# Patient Record
Sex: Female | Born: 1935
Health system: Southern US, Community
[De-identification: ages and names within clinical notes are randomized; demographics above are authoritative.]

## PROBLEM LIST (undated history)

## (undated) ENCOUNTER — Other Ambulatory Visit: Payer: Medicare Other | Admitting: Urology

## (undated) DIAGNOSIS — I639 Cerebral infarction, unspecified: Secondary | ICD-10-CM

## (undated) DIAGNOSIS — R569 Unspecified convulsions: Secondary | ICD-10-CM

## (undated) DIAGNOSIS — Z803 Family history of malignant neoplasm of breast: Secondary | ICD-10-CM

## (undated) DIAGNOSIS — M1990 Unspecified osteoarthritis, unspecified site: Secondary | ICD-10-CM

## (undated) DIAGNOSIS — E782 Mixed hyperlipidemia: Secondary | ICD-10-CM

## (undated) DIAGNOSIS — G3184 Mild cognitive impairment, so stated: Secondary | ICD-10-CM

## (undated) DIAGNOSIS — Z801 Family history of malignant neoplasm of trachea, bronchus and lung: Secondary | ICD-10-CM

## (undated) DIAGNOSIS — K219 Gastro-esophageal reflux disease without esophagitis: Secondary | ICD-10-CM

## (undated) DIAGNOSIS — E119 Type 2 diabetes mellitus without complications: Secondary | ICD-10-CM

## (undated) DIAGNOSIS — C689 Malignant neoplasm of urinary organ, unspecified: Secondary | ICD-10-CM

## (undated) DIAGNOSIS — E7800 Pure hypercholesterolemia, unspecified: Secondary | ICD-10-CM

## (undated) DIAGNOSIS — I10 Essential (primary) hypertension: Secondary | ICD-10-CM

## (undated) DIAGNOSIS — M359 Systemic involvement of connective tissue, unspecified: Secondary | ICD-10-CM

## (undated) DIAGNOSIS — R200 Anesthesia of skin: Secondary | ICD-10-CM

## (undated) DIAGNOSIS — R202 Paresthesia of skin: Secondary | ICD-10-CM

## (undated) DIAGNOSIS — D649 Anemia, unspecified: Secondary | ICD-10-CM

## (undated) DIAGNOSIS — C50919 Malignant neoplasm of unspecified site of unspecified female breast: Secondary | ICD-10-CM

## (undated) DIAGNOSIS — N390 Urinary tract infection, site not specified: Secondary | ICD-10-CM

## (undated) DIAGNOSIS — C801 Malignant (primary) neoplasm, unspecified: Secondary | ICD-10-CM

## (undated) DIAGNOSIS — Z8052 Family history of malignant neoplasm of bladder: Secondary | ICD-10-CM

## (undated) DIAGNOSIS — Z8554 Personal history of malignant neoplasm of ureter: Secondary | ICD-10-CM

## (undated) DIAGNOSIS — R9082 White matter disease, unspecified: Secondary | ICD-10-CM

## (undated) HISTORY — PX: OTHER SURGICAL HISTORY: SHX169

## (undated) HISTORY — DX: Essential (primary) hypertension: I10

## (undated) HISTORY — DX: Family history of malignant neoplasm of breast: Z80.3

## (undated) HISTORY — PX: COLONOSCOPY: SHX174

## (undated) HISTORY — DX: Personal history of malignant neoplasm of ureter: Z85.54

## (undated) HISTORY — PX: JOINT REPLACEMENT: SHX530

## (undated) HISTORY — DX: Family history of malignant neoplasm of bladder: Z80.52

## (undated) HISTORY — DX: Family history of malignant neoplasm of trachea, bronchus and lung: Z80.1

---

## 1984-03-13 DIAGNOSIS — C50919 Malignant neoplasm of unspecified site of unspecified female breast: Secondary | ICD-10-CM

## 1984-03-13 HISTORY — DX: Malignant neoplasm of unspecified site of unspecified female breast: C50.919

## 1984-03-13 HISTORY — PX: MASTECTOMY: SHX3

## 1984-07-11 HISTORY — PX: BREAST SURGERY: SHX581

## 1984-09-10 HISTORY — PX: ABDOMINAL HYSTERECTOMY: SHX81

## 1997-09-29 ENCOUNTER — Other Ambulatory Visit: Admission: RE | Admit: 1997-09-29 | Discharge: 1997-09-29 | Payer: Self-pay | Admitting: Podiatry

## 2004-09-29 ENCOUNTER — Ambulatory Visit: Payer: Self-pay | Admitting: Internal Medicine

## 2004-09-29 LAB — HM COLONOSCOPY: HM Colonoscopy: NORMAL

## 2008-03-24 LAB — HM PAP SMEAR

## 2008-03-31 ENCOUNTER — Ambulatory Visit: Payer: Self-pay | Admitting: Family Medicine

## 2008-11-18 ENCOUNTER — Ambulatory Visit: Payer: Self-pay | Admitting: Otolaryngology

## 2008-12-24 ENCOUNTER — Ambulatory Visit: Payer: Self-pay | Admitting: Gastroenterology

## 2010-08-25 ENCOUNTER — Ambulatory Visit: Payer: Self-pay | Admitting: Family Medicine

## 2011-03-14 HISTORY — PX: EYE SURGERY: SHX253

## 2011-04-19 DIAGNOSIS — L570 Actinic keratosis: Secondary | ICD-10-CM | POA: Diagnosis not present

## 2011-04-19 DIAGNOSIS — L219 Seborrheic dermatitis, unspecified: Secondary | ICD-10-CM | POA: Diagnosis not present

## 2011-04-19 DIAGNOSIS — B351 Tinea unguium: Secondary | ICD-10-CM | POA: Diagnosis not present

## 2011-04-19 DIAGNOSIS — C44611 Basal cell carcinoma of skin of unspecified upper limb, including shoulder: Secondary | ICD-10-CM | POA: Diagnosis not present

## 2011-04-19 DIAGNOSIS — D485 Neoplasm of uncertain behavior of skin: Secondary | ICD-10-CM | POA: Diagnosis not present

## 2011-05-18 DIAGNOSIS — Z79899 Other long term (current) drug therapy: Secondary | ICD-10-CM | POA: Diagnosis not present

## 2011-05-18 DIAGNOSIS — H35719 Central serous chorioretinopathy, unspecified eye: Secondary | ICD-10-CM | POA: Diagnosis not present

## 2011-05-18 DIAGNOSIS — E785 Hyperlipidemia, unspecified: Secondary | ICD-10-CM | POA: Diagnosis not present

## 2011-05-18 DIAGNOSIS — E559 Vitamin D deficiency, unspecified: Secondary | ICD-10-CM | POA: Diagnosis not present

## 2011-05-22 DIAGNOSIS — H35349 Macular cyst, hole, or pseudohole, unspecified eye: Secondary | ICD-10-CM | POA: Diagnosis not present

## 2011-05-24 DIAGNOSIS — C4491 Basal cell carcinoma of skin, unspecified: Secondary | ICD-10-CM | POA: Diagnosis not present

## 2011-05-24 DIAGNOSIS — C44611 Basal cell carcinoma of skin of unspecified upper limb, including shoulder: Secondary | ICD-10-CM | POA: Diagnosis not present

## 2011-05-24 DIAGNOSIS — L219 Seborrheic dermatitis, unspecified: Secondary | ICD-10-CM | POA: Diagnosis not present

## 2011-06-06 DIAGNOSIS — H35349 Macular cyst, hole, or pseudohole, unspecified eye: Secondary | ICD-10-CM | POA: Diagnosis not present

## 2011-06-26 ENCOUNTER — Ambulatory Visit: Payer: Self-pay | Admitting: Ophthalmology

## 2011-06-26 DIAGNOSIS — H35349 Macular cyst, hole, or pseudohole, unspecified eye: Secondary | ICD-10-CM | POA: Diagnosis not present

## 2011-06-26 DIAGNOSIS — Z01818 Encounter for other preprocedural examination: Secondary | ICD-10-CM | POA: Diagnosis not present

## 2011-06-26 DIAGNOSIS — Z01810 Encounter for preprocedural cardiovascular examination: Secondary | ICD-10-CM | POA: Diagnosis not present

## 2011-06-27 DIAGNOSIS — H35349 Macular cyst, hole, or pseudohole, unspecified eye: Secondary | ICD-10-CM | POA: Diagnosis not present

## 2011-06-28 ENCOUNTER — Ambulatory Visit: Payer: Self-pay | Admitting: Ophthalmology

## 2011-06-28 DIAGNOSIS — H33309 Unspecified retinal break, unspecified eye: Secondary | ICD-10-CM | POA: Diagnosis not present

## 2011-06-28 DIAGNOSIS — Z853 Personal history of malignant neoplasm of breast: Secondary | ICD-10-CM | POA: Diagnosis not present

## 2011-06-28 DIAGNOSIS — E669 Obesity, unspecified: Secondary | ICD-10-CM | POA: Diagnosis not present

## 2011-06-28 DIAGNOSIS — Z7982 Long term (current) use of aspirin: Secondary | ICD-10-CM | POA: Diagnosis not present

## 2011-06-28 DIAGNOSIS — K219 Gastro-esophageal reflux disease without esophagitis: Secondary | ICD-10-CM | POA: Diagnosis not present

## 2011-06-28 DIAGNOSIS — Z79899 Other long term (current) drug therapy: Secondary | ICD-10-CM | POA: Diagnosis not present

## 2011-06-28 DIAGNOSIS — M129 Arthropathy, unspecified: Secondary | ICD-10-CM | POA: Diagnosis not present

## 2011-06-28 DIAGNOSIS — H33009 Unspecified retinal detachment with retinal break, unspecified eye: Secondary | ICD-10-CM | POA: Diagnosis not present

## 2011-06-28 DIAGNOSIS — E7800 Pure hypercholesterolemia, unspecified: Secondary | ICD-10-CM | POA: Diagnosis not present

## 2011-06-28 DIAGNOSIS — H35349 Macular cyst, hole, or pseudohole, unspecified eye: Secondary | ICD-10-CM | POA: Diagnosis not present

## 2011-07-25 DIAGNOSIS — H35349 Macular cyst, hole, or pseudohole, unspecified eye: Secondary | ICD-10-CM | POA: Diagnosis not present

## 2011-08-08 DIAGNOSIS — Z1331 Encounter for screening for depression: Secondary | ICD-10-CM | POA: Diagnosis not present

## 2011-08-08 DIAGNOSIS — Z0000 Encounter for general adult medical examination without abnormal findings: Secondary | ICD-10-CM | POA: Diagnosis not present

## 2011-08-08 DIAGNOSIS — Z1339 Encounter for screening examination for other mental health and behavioral disorders: Secondary | ICD-10-CM | POA: Diagnosis not present

## 2011-08-08 DIAGNOSIS — Z23 Encounter for immunization: Secondary | ICD-10-CM | POA: Diagnosis not present

## 2011-08-08 DIAGNOSIS — D493 Neoplasm of unspecified behavior of breast: Secondary | ICD-10-CM | POA: Diagnosis not present

## 2011-09-06 DIAGNOSIS — M899 Disorder of bone, unspecified: Secondary | ICD-10-CM | POA: Diagnosis not present

## 2011-09-06 DIAGNOSIS — E2839 Other primary ovarian failure: Secondary | ICD-10-CM | POA: Diagnosis not present

## 2011-09-06 DIAGNOSIS — M949 Disorder of cartilage, unspecified: Secondary | ICD-10-CM | POA: Diagnosis not present

## 2011-09-06 DIAGNOSIS — H35349 Macular cyst, hole, or pseudohole, unspecified eye: Secondary | ICD-10-CM | POA: Diagnosis not present

## 2011-09-06 DIAGNOSIS — M948X9 Other specified disorders of cartilage, unspecified sites: Secondary | ICD-10-CM | POA: Diagnosis not present

## 2011-09-13 ENCOUNTER — Ambulatory Visit: Payer: Self-pay | Admitting: Family Medicine

## 2011-09-13 DIAGNOSIS — Z1231 Encounter for screening mammogram for malignant neoplasm of breast: Secondary | ICD-10-CM | POA: Diagnosis not present

## 2011-09-13 DIAGNOSIS — Z853 Personal history of malignant neoplasm of breast: Secondary | ICD-10-CM | POA: Diagnosis not present

## 2011-10-31 DIAGNOSIS — D493 Neoplasm of unspecified behavior of breast: Secondary | ICD-10-CM | POA: Diagnosis not present

## 2011-10-31 DIAGNOSIS — N390 Urinary tract infection, site not specified: Secondary | ICD-10-CM | POA: Diagnosis not present

## 2011-10-31 DIAGNOSIS — Z1331 Encounter for screening for depression: Secondary | ICD-10-CM | POA: Diagnosis not present

## 2011-10-31 DIAGNOSIS — R109 Unspecified abdominal pain: Secondary | ICD-10-CM | POA: Diagnosis not present

## 2011-11-06 DIAGNOSIS — H35379 Puckering of macula, unspecified eye: Secondary | ICD-10-CM | POA: Diagnosis not present

## 2012-01-23 DIAGNOSIS — H35349 Macular cyst, hole, or pseudohole, unspecified eye: Secondary | ICD-10-CM | POA: Diagnosis not present

## 2012-01-23 DIAGNOSIS — R5383 Other fatigue: Secondary | ICD-10-CM | POA: Diagnosis not present

## 2012-01-23 DIAGNOSIS — N390 Urinary tract infection, site not specified: Secondary | ICD-10-CM | POA: Diagnosis not present

## 2012-01-23 DIAGNOSIS — E559 Vitamin D deficiency, unspecified: Secondary | ICD-10-CM | POA: Diagnosis not present

## 2012-01-23 DIAGNOSIS — Z79899 Other long term (current) drug therapy: Secondary | ICD-10-CM | POA: Diagnosis not present

## 2012-01-23 DIAGNOSIS — R5381 Other malaise: Secondary | ICD-10-CM | POA: Diagnosis not present

## 2012-01-23 DIAGNOSIS — Z23 Encounter for immunization: Secondary | ICD-10-CM | POA: Diagnosis not present

## 2012-01-23 DIAGNOSIS — E7800 Pure hypercholesterolemia, unspecified: Secondary | ICD-10-CM | POA: Diagnosis not present

## 2012-02-16 DIAGNOSIS — H2510 Age-related nuclear cataract, unspecified eye: Secondary | ICD-10-CM | POA: Diagnosis not present

## 2012-04-08 DIAGNOSIS — L723 Sebaceous cyst: Secondary | ICD-10-CM | POA: Diagnosis not present

## 2012-04-08 DIAGNOSIS — L259 Unspecified contact dermatitis, unspecified cause: Secondary | ICD-10-CM | POA: Diagnosis not present

## 2012-04-08 DIAGNOSIS — L608 Other nail disorders: Secondary | ICD-10-CM | POA: Diagnosis not present

## 2012-04-23 DIAGNOSIS — H2510 Age-related nuclear cataract, unspecified eye: Secondary | ICD-10-CM | POA: Diagnosis not present

## 2012-04-30 ENCOUNTER — Ambulatory Visit: Payer: Self-pay | Admitting: Ophthalmology

## 2012-04-30 DIAGNOSIS — Z79899 Other long term (current) drug therapy: Secondary | ICD-10-CM | POA: Diagnosis not present

## 2012-04-30 DIAGNOSIS — Z880 Allergy status to penicillin: Secondary | ICD-10-CM | POA: Diagnosis not present

## 2012-04-30 DIAGNOSIS — K219 Gastro-esophageal reflux disease without esophagitis: Secondary | ICD-10-CM | POA: Diagnosis not present

## 2012-04-30 DIAGNOSIS — H2510 Age-related nuclear cataract, unspecified eye: Secondary | ICD-10-CM | POA: Diagnosis not present

## 2012-04-30 DIAGNOSIS — H9190 Unspecified hearing loss, unspecified ear: Secondary | ICD-10-CM | POA: Diagnosis not present

## 2012-04-30 DIAGNOSIS — M810 Age-related osteoporosis without current pathological fracture: Secondary | ICD-10-CM | POA: Diagnosis not present

## 2012-04-30 DIAGNOSIS — R011 Cardiac murmur, unspecified: Secondary | ICD-10-CM | POA: Diagnosis not present

## 2012-04-30 DIAGNOSIS — Z7982 Long term (current) use of aspirin: Secondary | ICD-10-CM | POA: Diagnosis not present

## 2012-04-30 DIAGNOSIS — Z853 Personal history of malignant neoplasm of breast: Secondary | ICD-10-CM | POA: Diagnosis not present

## 2012-04-30 DIAGNOSIS — E7800 Pure hypercholesterolemia, unspecified: Secondary | ICD-10-CM | POA: Diagnosis not present

## 2012-04-30 DIAGNOSIS — Z882 Allergy status to sulfonamides status: Secondary | ICD-10-CM | POA: Diagnosis not present

## 2012-04-30 DIAGNOSIS — M069 Rheumatoid arthritis, unspecified: Secondary | ICD-10-CM | POA: Diagnosis not present

## 2012-07-17 DIAGNOSIS — M2030 Hallux varus (acquired), unspecified foot: Secondary | ICD-10-CM | POA: Diagnosis not present

## 2012-07-17 DIAGNOSIS — L600 Ingrowing nail: Secondary | ICD-10-CM | POA: Diagnosis not present

## 2012-07-17 DIAGNOSIS — M19079 Primary osteoarthritis, unspecified ankle and foot: Secondary | ICD-10-CM | POA: Diagnosis not present

## 2012-07-17 DIAGNOSIS — M79609 Pain in unspecified limb: Secondary | ICD-10-CM | POA: Diagnosis not present

## 2012-07-17 DIAGNOSIS — M2040 Other hammer toe(s) (acquired), unspecified foot: Secondary | ICD-10-CM | POA: Diagnosis not present

## 2012-07-23 DIAGNOSIS — H35349 Macular cyst, hole, or pseudohole, unspecified eye: Secondary | ICD-10-CM | POA: Diagnosis not present

## 2012-08-14 DIAGNOSIS — Z23 Encounter for immunization: Secondary | ICD-10-CM | POA: Diagnosis not present

## 2012-08-14 DIAGNOSIS — Z1331 Encounter for screening for depression: Secondary | ICD-10-CM | POA: Diagnosis not present

## 2012-08-14 DIAGNOSIS — Z1339 Encounter for screening examination for other mental health and behavioral disorders: Secondary | ICD-10-CM | POA: Diagnosis not present

## 2012-08-14 DIAGNOSIS — Z0000 Encounter for general adult medical examination without abnormal findings: Secondary | ICD-10-CM | POA: Diagnosis not present

## 2012-08-14 DIAGNOSIS — E559 Vitamin D deficiency, unspecified: Secondary | ICD-10-CM | POA: Diagnosis not present

## 2012-09-04 DIAGNOSIS — M2040 Other hammer toe(s) (acquired), unspecified foot: Secondary | ICD-10-CM | POA: Diagnosis not present

## 2012-09-04 DIAGNOSIS — M2030 Hallux varus (acquired), unspecified foot: Secondary | ICD-10-CM | POA: Diagnosis not present

## 2012-09-04 DIAGNOSIS — L03039 Cellulitis of unspecified toe: Secondary | ICD-10-CM | POA: Diagnosis not present

## 2012-09-04 DIAGNOSIS — L600 Ingrowing nail: Secondary | ICD-10-CM | POA: Diagnosis not present

## 2012-09-19 DIAGNOSIS — L820 Inflamed seborrheic keratosis: Secondary | ICD-10-CM | POA: Diagnosis not present

## 2012-09-19 DIAGNOSIS — Z1283 Encounter for screening for malignant neoplasm of skin: Secondary | ICD-10-CM | POA: Diagnosis not present

## 2012-09-19 DIAGNOSIS — Z85828 Personal history of other malignant neoplasm of skin: Secondary | ICD-10-CM | POA: Diagnosis not present

## 2012-12-04 DIAGNOSIS — Z23 Encounter for immunization: Secondary | ICD-10-CM | POA: Diagnosis not present

## 2012-12-12 DIAGNOSIS — M129 Arthropathy, unspecified: Secondary | ICD-10-CM | POA: Diagnosis not present

## 2012-12-12 DIAGNOSIS — E7800 Pure hypercholesterolemia, unspecified: Secondary | ICD-10-CM | POA: Diagnosis not present

## 2012-12-12 DIAGNOSIS — E559 Vitamin D deficiency, unspecified: Secondary | ICD-10-CM | POA: Diagnosis not present

## 2012-12-12 DIAGNOSIS — I10 Essential (primary) hypertension: Secondary | ICD-10-CM | POA: Diagnosis not present

## 2012-12-12 DIAGNOSIS — E669 Obesity, unspecified: Secondary | ICD-10-CM | POA: Diagnosis not present

## 2012-12-12 DIAGNOSIS — E785 Hyperlipidemia, unspecified: Secondary | ICD-10-CM | POA: Diagnosis not present

## 2012-12-12 DIAGNOSIS — C50919 Malignant neoplasm of unspecified site of unspecified female breast: Secondary | ICD-10-CM | POA: Diagnosis not present

## 2012-12-17 ENCOUNTER — Ambulatory Visit: Payer: Self-pay | Admitting: Family Medicine

## 2012-12-17 DIAGNOSIS — R928 Other abnormal and inconclusive findings on diagnostic imaging of breast: Secondary | ICD-10-CM | POA: Diagnosis not present

## 2012-12-17 DIAGNOSIS — Z853 Personal history of malignant neoplasm of breast: Secondary | ICD-10-CM | POA: Diagnosis not present

## 2012-12-17 DIAGNOSIS — Z1231 Encounter for screening mammogram for malignant neoplasm of breast: Secondary | ICD-10-CM | POA: Diagnosis not present

## 2012-12-19 DIAGNOSIS — M2040 Other hammer toe(s) (acquired), unspecified foot: Secondary | ICD-10-CM | POA: Diagnosis not present

## 2012-12-19 DIAGNOSIS — M2030 Hallux varus (acquired), unspecified foot: Secondary | ICD-10-CM | POA: Diagnosis not present

## 2013-01-15 DIAGNOSIS — M1710 Unilateral primary osteoarthritis, unspecified knee: Secondary | ICD-10-CM | POA: Diagnosis not present

## 2013-01-17 ENCOUNTER — Ambulatory Visit: Payer: Self-pay | Admitting: Family Medicine

## 2013-01-17 DIAGNOSIS — Z853 Personal history of malignant neoplasm of breast: Secondary | ICD-10-CM | POA: Diagnosis not present

## 2013-01-17 DIAGNOSIS — N6019 Diffuse cystic mastopathy of unspecified breast: Secondary | ICD-10-CM | POA: Diagnosis not present

## 2013-01-17 DIAGNOSIS — N63 Unspecified lump in unspecified breast: Secondary | ICD-10-CM | POA: Diagnosis not present

## 2013-01-17 DIAGNOSIS — H2510 Age-related nuclear cataract, unspecified eye: Secondary | ICD-10-CM | POA: Diagnosis not present

## 2013-01-23 ENCOUNTER — Ambulatory Visit: Payer: Self-pay | Admitting: Family Medicine

## 2013-01-23 DIAGNOSIS — N6009 Solitary cyst of unspecified breast: Secondary | ICD-10-CM | POA: Diagnosis not present

## 2013-01-23 DIAGNOSIS — N63 Unspecified lump in unspecified breast: Secondary | ICD-10-CM | POA: Diagnosis not present

## 2013-01-23 HISTORY — PX: BREAST BIOPSY: SHX20

## 2013-01-31 DIAGNOSIS — M76899 Other specified enthesopathies of unspecified lower limb, excluding foot: Secondary | ICD-10-CM | POA: Diagnosis not present

## 2013-03-13 HISTORY — PX: CATARACT EXTRACTION W/ INTRAOCULAR LENS IMPLANT: SHX1309

## 2013-03-24 DIAGNOSIS — C50919 Malignant neoplasm of unspecified site of unspecified female breast: Secondary | ICD-10-CM | POA: Diagnosis not present

## 2013-03-24 DIAGNOSIS — M2010 Hallux valgus (acquired), unspecified foot: Secondary | ICD-10-CM | POA: Diagnosis not present

## 2013-03-24 DIAGNOSIS — E669 Obesity, unspecified: Secondary | ICD-10-CM | POA: Diagnosis not present

## 2013-03-24 DIAGNOSIS — M129 Arthropathy, unspecified: Secondary | ICD-10-CM | POA: Diagnosis not present

## 2013-03-24 DIAGNOSIS — M2030 Hallux varus (acquired), unspecified foot: Secondary | ICD-10-CM | POA: Diagnosis not present

## 2013-03-24 DIAGNOSIS — M79609 Pain in unspecified limb: Secondary | ICD-10-CM | POA: Diagnosis not present

## 2013-03-24 DIAGNOSIS — I10 Essential (primary) hypertension: Secondary | ICD-10-CM | POA: Diagnosis not present

## 2013-03-24 DIAGNOSIS — M2040 Other hammer toe(s) (acquired), unspecified foot: Secondary | ICD-10-CM | POA: Diagnosis not present

## 2013-03-24 DIAGNOSIS — N390 Urinary tract infection, site not specified: Secondary | ICD-10-CM | POA: Diagnosis not present

## 2013-03-24 DIAGNOSIS — E785 Hyperlipidemia, unspecified: Secondary | ICD-10-CM | POA: Diagnosis not present

## 2013-04-08 DIAGNOSIS — C50919 Malignant neoplasm of unspecified site of unspecified female breast: Secondary | ICD-10-CM | POA: Diagnosis not present

## 2013-04-08 DIAGNOSIS — M2040 Other hammer toe(s) (acquired), unspecified foot: Secondary | ICD-10-CM | POA: Diagnosis not present

## 2013-04-08 DIAGNOSIS — E669 Obesity, unspecified: Secondary | ICD-10-CM | POA: Diagnosis not present

## 2013-04-08 DIAGNOSIS — M19019 Primary osteoarthritis, unspecified shoulder: Secondary | ICD-10-CM | POA: Diagnosis not present

## 2013-04-08 DIAGNOSIS — E559 Vitamin D deficiency, unspecified: Secondary | ICD-10-CM | POA: Diagnosis not present

## 2013-04-08 DIAGNOSIS — E7800 Pure hypercholesterolemia, unspecified: Secondary | ICD-10-CM | POA: Diagnosis not present

## 2013-04-09 DIAGNOSIS — M2040 Other hammer toe(s) (acquired), unspecified foot: Secondary | ICD-10-CM | POA: Diagnosis not present

## 2013-04-09 DIAGNOSIS — M79609 Pain in unspecified limb: Secondary | ICD-10-CM | POA: Diagnosis not present

## 2013-04-13 HISTORY — PX: FOOT SURGERY: SHX648

## 2013-04-16 ENCOUNTER — Ambulatory Visit: Payer: Self-pay | Admitting: Podiatry

## 2013-04-16 DIAGNOSIS — M259 Joint disorder, unspecified: Secondary | ICD-10-CM | POA: Diagnosis not present

## 2013-04-16 DIAGNOSIS — M2030 Hallux varus (acquired), unspecified foot: Secondary | ICD-10-CM | POA: Diagnosis not present

## 2013-04-16 DIAGNOSIS — E7800 Pure hypercholesterolemia, unspecified: Secondary | ICD-10-CM | POA: Diagnosis not present

## 2013-04-16 DIAGNOSIS — E669 Obesity, unspecified: Secondary | ICD-10-CM | POA: Diagnosis not present

## 2013-04-16 DIAGNOSIS — Z853 Personal history of malignant neoplasm of breast: Secondary | ICD-10-CM | POA: Diagnosis not present

## 2013-04-16 DIAGNOSIS — Z882 Allergy status to sulfonamides status: Secondary | ICD-10-CM | POA: Diagnosis not present

## 2013-04-16 DIAGNOSIS — M2040 Other hammer toe(s) (acquired), unspecified foot: Secondary | ICD-10-CM | POA: Diagnosis not present

## 2013-04-16 DIAGNOSIS — Z880 Allergy status to penicillin: Secondary | ICD-10-CM | POA: Diagnosis not present

## 2013-04-16 DIAGNOSIS — K219 Gastro-esophageal reflux disease without esophagitis: Secondary | ICD-10-CM | POA: Diagnosis not present

## 2013-04-21 DIAGNOSIS — M2040 Other hammer toe(s) (acquired), unspecified foot: Secondary | ICD-10-CM | POA: Diagnosis not present

## 2013-06-02 DIAGNOSIS — M2040 Other hammer toe(s) (acquired), unspecified foot: Secondary | ICD-10-CM | POA: Diagnosis not present

## 2013-07-14 DIAGNOSIS — M2040 Other hammer toe(s) (acquired), unspecified foot: Secondary | ICD-10-CM | POA: Diagnosis not present

## 2013-07-14 DIAGNOSIS — M5430 Sciatica, unspecified side: Secondary | ICD-10-CM | POA: Diagnosis not present

## 2013-07-14 DIAGNOSIS — R262 Difficulty in walking, not elsewhere classified: Secondary | ICD-10-CM | POA: Diagnosis not present

## 2013-07-17 DIAGNOSIS — M5430 Sciatica, unspecified side: Secondary | ICD-10-CM | POA: Diagnosis not present

## 2013-07-17 DIAGNOSIS — R262 Difficulty in walking, not elsewhere classified: Secondary | ICD-10-CM | POA: Diagnosis not present

## 2013-07-21 DIAGNOSIS — M5430 Sciatica, unspecified side: Secondary | ICD-10-CM | POA: Diagnosis not present

## 2013-07-21 DIAGNOSIS — R262 Difficulty in walking, not elsewhere classified: Secondary | ICD-10-CM | POA: Diagnosis not present

## 2013-07-24 DIAGNOSIS — R262 Difficulty in walking, not elsewhere classified: Secondary | ICD-10-CM | POA: Diagnosis not present

## 2013-07-24 DIAGNOSIS — M5430 Sciatica, unspecified side: Secondary | ICD-10-CM | POA: Diagnosis not present

## 2013-07-28 DIAGNOSIS — R262 Difficulty in walking, not elsewhere classified: Secondary | ICD-10-CM | POA: Diagnosis not present

## 2013-07-28 DIAGNOSIS — M5430 Sciatica, unspecified side: Secondary | ICD-10-CM | POA: Diagnosis not present

## 2013-07-31 DIAGNOSIS — R262 Difficulty in walking, not elsewhere classified: Secondary | ICD-10-CM | POA: Diagnosis not present

## 2013-07-31 DIAGNOSIS — M5430 Sciatica, unspecified side: Secondary | ICD-10-CM | POA: Diagnosis not present

## 2013-08-04 DIAGNOSIS — R262 Difficulty in walking, not elsewhere classified: Secondary | ICD-10-CM | POA: Diagnosis not present

## 2013-08-04 DIAGNOSIS — M5430 Sciatica, unspecified side: Secondary | ICD-10-CM | POA: Diagnosis not present

## 2013-08-07 DIAGNOSIS — M5430 Sciatica, unspecified side: Secondary | ICD-10-CM | POA: Diagnosis not present

## 2013-08-07 DIAGNOSIS — R262 Difficulty in walking, not elsewhere classified: Secondary | ICD-10-CM | POA: Diagnosis not present

## 2013-08-11 DIAGNOSIS — M5430 Sciatica, unspecified side: Secondary | ICD-10-CM | POA: Diagnosis not present

## 2013-08-11 DIAGNOSIS — R262 Difficulty in walking, not elsewhere classified: Secondary | ICD-10-CM | POA: Diagnosis not present

## 2013-09-08 DIAGNOSIS — E7800 Pure hypercholesterolemia, unspecified: Secondary | ICD-10-CM | POA: Diagnosis not present

## 2013-09-08 DIAGNOSIS — Z0000 Encounter for general adult medical examination without abnormal findings: Secondary | ICD-10-CM | POA: Diagnosis not present

## 2013-09-08 DIAGNOSIS — C50919 Malignant neoplasm of unspecified site of unspecified female breast: Secondary | ICD-10-CM | POA: Diagnosis not present

## 2013-09-08 DIAGNOSIS — Z13820 Encounter for screening for osteoporosis: Secondary | ICD-10-CM | POA: Diagnosis not present

## 2013-09-08 DIAGNOSIS — M2040 Other hammer toe(s) (acquired), unspecified foot: Secondary | ICD-10-CM | POA: Diagnosis not present

## 2013-09-08 DIAGNOSIS — Z01419 Encounter for gynecological examination (general) (routine) without abnormal findings: Secondary | ICD-10-CM | POA: Diagnosis not present

## 2013-09-08 DIAGNOSIS — Z780 Asymptomatic menopausal state: Secondary | ICD-10-CM | POA: Diagnosis not present

## 2013-09-08 DIAGNOSIS — Z23 Encounter for immunization: Secondary | ICD-10-CM | POA: Diagnosis not present

## 2013-09-08 DIAGNOSIS — M19019 Primary osteoarthritis, unspecified shoulder: Secondary | ICD-10-CM | POA: Diagnosis not present

## 2013-09-08 DIAGNOSIS — Z1339 Encounter for screening examination for other mental health and behavioral disorders: Secondary | ICD-10-CM | POA: Diagnosis not present

## 2013-09-08 DIAGNOSIS — E2839 Other primary ovarian failure: Secondary | ICD-10-CM | POA: Diagnosis not present

## 2013-09-08 DIAGNOSIS — E669 Obesity, unspecified: Secondary | ICD-10-CM | POA: Diagnosis not present

## 2013-09-08 LAB — HM DEXA SCAN

## 2013-09-22 DIAGNOSIS — L03039 Cellulitis of unspecified toe: Secondary | ICD-10-CM | POA: Diagnosis not present

## 2013-10-06 DIAGNOSIS — M79609 Pain in unspecified limb: Secondary | ICD-10-CM | POA: Diagnosis not present

## 2013-10-06 DIAGNOSIS — L03039 Cellulitis of unspecified toe: Secondary | ICD-10-CM | POA: Diagnosis not present

## 2013-10-06 DIAGNOSIS — M2040 Other hammer toe(s) (acquired), unspecified foot: Secondary | ICD-10-CM | POA: Diagnosis not present

## 2013-11-11 DIAGNOSIS — M545 Low back pain, unspecified: Secondary | ICD-10-CM | POA: Diagnosis not present

## 2013-11-26 DIAGNOSIS — S9030XA Contusion of unspecified foot, initial encounter: Secondary | ICD-10-CM | POA: Diagnosis not present

## 2013-11-26 DIAGNOSIS — M79609 Pain in unspecified limb: Secondary | ICD-10-CM | POA: Diagnosis not present

## 2013-12-11 DIAGNOSIS — M545 Low back pain: Secondary | ICD-10-CM | POA: Diagnosis not present

## 2013-12-16 DIAGNOSIS — M545 Low back pain: Secondary | ICD-10-CM | POA: Diagnosis not present

## 2013-12-18 DIAGNOSIS — M545 Low back pain: Secondary | ICD-10-CM | POA: Diagnosis not present

## 2013-12-24 DIAGNOSIS — Z23 Encounter for immunization: Secondary | ICD-10-CM | POA: Diagnosis not present

## 2013-12-24 DIAGNOSIS — M545 Low back pain: Secondary | ICD-10-CM | POA: Diagnosis not present

## 2013-12-25 DIAGNOSIS — M545 Low back pain: Secondary | ICD-10-CM | POA: Diagnosis not present

## 2013-12-29 DIAGNOSIS — M545 Low back pain: Secondary | ICD-10-CM | POA: Diagnosis not present

## 2014-01-01 DIAGNOSIS — M545 Low back pain: Secondary | ICD-10-CM | POA: Diagnosis not present

## 2014-01-07 DIAGNOSIS — M545 Low back pain: Secondary | ICD-10-CM | POA: Diagnosis not present

## 2014-01-11 DIAGNOSIS — M545 Low back pain: Secondary | ICD-10-CM | POA: Diagnosis not present

## 2014-01-27 DIAGNOSIS — C50919 Malignant neoplasm of unspecified site of unspecified female breast: Secondary | ICD-10-CM | POA: Diagnosis not present

## 2014-01-27 DIAGNOSIS — E780 Pure hypercholesterolemia: Secondary | ICD-10-CM | POA: Diagnosis not present

## 2014-01-27 DIAGNOSIS — I10 Essential (primary) hypertension: Secondary | ICD-10-CM | POA: Diagnosis not present

## 2014-01-27 DIAGNOSIS — E669 Obesity, unspecified: Secondary | ICD-10-CM | POA: Diagnosis not present

## 2014-01-27 DIAGNOSIS — M1990 Unspecified osteoarthritis, unspecified site: Secondary | ICD-10-CM | POA: Diagnosis not present

## 2014-02-02 DIAGNOSIS — M1711 Unilateral primary osteoarthritis, right knee: Secondary | ICD-10-CM | POA: Diagnosis not present

## 2014-02-02 DIAGNOSIS — M25561 Pain in right knee: Secondary | ICD-10-CM | POA: Diagnosis not present

## 2014-03-24 ENCOUNTER — Ambulatory Visit: Payer: Self-pay | Admitting: Family Medicine

## 2014-03-24 DIAGNOSIS — Z1283 Encounter for screening for malignant neoplasm of skin: Secondary | ICD-10-CM | POA: Diagnosis not present

## 2014-03-24 DIAGNOSIS — Z08 Encounter for follow-up examination after completed treatment for malignant neoplasm: Secondary | ICD-10-CM | POA: Diagnosis not present

## 2014-03-24 DIAGNOSIS — L218 Other seborrheic dermatitis: Secondary | ICD-10-CM | POA: Diagnosis not present

## 2014-03-24 DIAGNOSIS — L570 Actinic keratosis: Secondary | ICD-10-CM | POA: Diagnosis not present

## 2014-03-24 DIAGNOSIS — Z85828 Personal history of other malignant neoplasm of skin: Secondary | ICD-10-CM | POA: Diagnosis not present

## 2014-03-24 DIAGNOSIS — Z1231 Encounter for screening mammogram for malignant neoplasm of breast: Secondary | ICD-10-CM | POA: Diagnosis not present

## 2014-03-24 DIAGNOSIS — L918 Other hypertrophic disorders of the skin: Secondary | ICD-10-CM | POA: Diagnosis not present

## 2014-03-24 DIAGNOSIS — D485 Neoplasm of uncertain behavior of skin: Secondary | ICD-10-CM | POA: Diagnosis not present

## 2014-03-24 LAB — HM MAMMOGRAPHY

## 2014-05-07 ENCOUNTER — Inpatient Hospital Stay: Payer: Self-pay | Admitting: Internal Medicine

## 2014-05-07 DIAGNOSIS — R4789 Other speech disturbances: Secondary | ICD-10-CM | POA: Diagnosis not present

## 2014-05-07 DIAGNOSIS — I10 Essential (primary) hypertension: Secondary | ICD-10-CM | POA: Diagnosis not present

## 2014-05-07 DIAGNOSIS — Z79899 Other long term (current) drug therapy: Secondary | ICD-10-CM | POA: Diagnosis not present

## 2014-05-07 DIAGNOSIS — R410 Disorientation, unspecified: Secondary | ICD-10-CM | POA: Diagnosis present

## 2014-05-07 DIAGNOSIS — E785 Hyperlipidemia, unspecified: Secondary | ICD-10-CM | POA: Diagnosis not present

## 2014-05-07 DIAGNOSIS — N3000 Acute cystitis without hematuria: Secondary | ICD-10-CM | POA: Diagnosis not present

## 2014-05-07 DIAGNOSIS — R531 Weakness: Secondary | ICD-10-CM | POA: Diagnosis not present

## 2014-05-07 DIAGNOSIS — Z880 Allergy status to penicillin: Secondary | ICD-10-CM | POA: Diagnosis not present

## 2014-05-07 DIAGNOSIS — Z7982 Long term (current) use of aspirin: Secondary | ICD-10-CM | POA: Diagnosis not present

## 2014-05-07 DIAGNOSIS — R9431 Abnormal electrocardiogram [ECG] [EKG]: Secondary | ICD-10-CM | POA: Diagnosis not present

## 2014-05-07 DIAGNOSIS — Z853 Personal history of malignant neoplasm of breast: Secondary | ICD-10-CM | POA: Diagnosis not present

## 2014-05-07 DIAGNOSIS — R4781 Slurred speech: Secondary | ICD-10-CM | POA: Diagnosis present

## 2014-05-07 DIAGNOSIS — Z882 Allergy status to sulfonamides status: Secondary | ICD-10-CM | POA: Diagnosis not present

## 2014-05-07 DIAGNOSIS — G459 Transient cerebral ischemic attack, unspecified: Secondary | ICD-10-CM | POA: Diagnosis not present

## 2014-05-07 DIAGNOSIS — R479 Unspecified speech disturbances: Secondary | ICD-10-CM | POA: Diagnosis not present

## 2014-05-07 DIAGNOSIS — Z823 Family history of stroke: Secondary | ICD-10-CM | POA: Diagnosis not present

## 2014-05-07 DIAGNOSIS — N390 Urinary tract infection, site not specified: Secondary | ICD-10-CM | POA: Diagnosis present

## 2014-05-07 DIAGNOSIS — I639 Cerebral infarction, unspecified: Secondary | ICD-10-CM | POA: Diagnosis not present

## 2014-05-19 DIAGNOSIS — M1990 Unspecified osteoarthritis, unspecified site: Secondary | ICD-10-CM | POA: Diagnosis not present

## 2014-05-19 DIAGNOSIS — G459 Transient cerebral ischemic attack, unspecified: Secondary | ICD-10-CM | POA: Diagnosis not present

## 2014-05-19 DIAGNOSIS — N390 Urinary tract infection, site not specified: Secondary | ICD-10-CM | POA: Diagnosis not present

## 2014-05-19 DIAGNOSIS — C50912 Malignant neoplasm of unspecified site of left female breast: Secondary | ICD-10-CM | POA: Diagnosis not present

## 2014-06-29 DIAGNOSIS — H26492 Other secondary cataract, left eye: Secondary | ICD-10-CM | POA: Diagnosis not present

## 2014-07-05 NOTE — Op Note (Signed)
PATIENT NAME:  Elizabeth Trujillo, HOESCHEN MR#:  [id number] DATE OF BIRTH:  Apr 13, 1935  DATE OF PROCEDURE:  06/28/2011  PROCEDURES PERFORMED:  1. Pars plana vitrectomy of the left eye.  2. Internal limiting membrane peel of the left eye.  3. Focal endolaser of the left eye.   PREOPERATIVE DIAGNOSIS: Full-thickness macular hole left eye.   POSTOPERATIVE DIAGNOSES:  1. Full-thickness macular hole left eye.  2. Retinal tear left eye.   PRIMARY SURGEON: Teresa Pelton. Ansley Stanwood, MD  ANESTHESIA: Retrobulbar block of the left eye with monitored anesthesia care.   COMPLICATIONS: None.   ESTIMATED BLOOD LOSS: Less than 1 mL.   INDICATIONS FOR PROCEDURE: This is a patient who presented to my office with decreasing vision in the left eye. Patient was noted to have a stage III full thickness macular hole of the left eye. Risks, benefits, and alternatives of the above procedure were discussed and the patient wished to proceed.   DETAILS OF PROCEDURE: After informed consent was obtained, the patient was brought into the operative suite at [facility]. Patient was placed in supine position, was given a small dose of Alfenta and a retrobulbar block was performed on the left eye by the primary surgeon without any complications. The left eye was prepped and draped in sterile manner. After lid speculum was inserted, a 25-gauge trocar was placed 4 mm beyond the limbus in the inferotemporal quadrant through displaced conjunctiva in an oblique fashion. The infusion cannula was turned on and inserted through the trocar and secured in position with Steri-Strips. Two more trocars were placed in a similar fashion superotemporally and superonasally. The vitreous cutter and light pipe were introduced in the eye and a core vitrectomy was performed. Vitreous face was elevated off of the optic nerve using suction. The vitreous face was then carefully peeled from and trimmed for 360 degrees out to the vitreous base.  Indocyanine green was injected onto the posterior pole and removed within 30 seconds. The internal limiting membrane was peeled for 360 degrees around the fovea for a total diameter of two disk diameters. Scleral depressed exam was then performed and a small retinal break was noted at approximately 1:00. Endolaser was introduced and four rows of laser was placed around the break. No other breaks or tears could identified for 360 degrees. An air-fluid exchange was then performed. Four minutes was allowed to elapse for dehydration of the vitreous base. This remnant fluid was also removed. 22% SF6 was used as an Acupuncturist and all the trocars were removed. The wounds were noted to be airtight and pressure in the eye was confirmed to be approximately 15 mmHg. 5 mg of dexamethasone was given into the inferior fornix and the lid speculum was removed. The eye was cleaned and TobraDex was placed on the eye. A patch and shield were placed over the eye and the patient was taken to postanesthesia care with instructions to remain face down.   ____________________________ Teresa Pelton. Osama Coleson, MD mfa:cms D: 06/28/2011 10:02:16 ET T: 06/28/2011 10:49:42 ET JOB#: [id number]  cc: Teresa Pelton. Starling Manns, MD, <Dictator> Coralee Rud MD ELECTRONICALLY SIGNED 07/05/2011 7:18

## 2014-07-06 DIAGNOSIS — L570 Actinic keratosis: Secondary | ICD-10-CM | POA: Diagnosis not present

## 2014-07-08 DIAGNOSIS — J309 Allergic rhinitis, unspecified: Secondary | ICD-10-CM | POA: Diagnosis not present

## 2014-07-08 DIAGNOSIS — I10 Essential (primary) hypertension: Secondary | ICD-10-CM | POA: Diagnosis not present

## 2014-07-08 DIAGNOSIS — Z87440 Personal history of urinary (tract) infections: Secondary | ICD-10-CM | POA: Diagnosis not present

## 2014-07-08 DIAGNOSIS — E780 Pure hypercholesterolemia: Secondary | ICD-10-CM | POA: Diagnosis not present

## 2014-07-08 DIAGNOSIS — R5383 Other fatigue: Secondary | ICD-10-CM | POA: Diagnosis not present

## 2014-07-08 DIAGNOSIS — N390 Urinary tract infection, site not specified: Secondary | ICD-10-CM | POA: Diagnosis not present

## 2014-07-08 DIAGNOSIS — E785 Hyperlipidemia, unspecified: Secondary | ICD-10-CM | POA: Diagnosis not present

## 2014-07-08 LAB — CBC AND DIFFERENTIAL
HCT: 44 % (ref 36–46)
HEMOGLOBIN: 14.8 g/dL (ref 12.0–16.0)
Neutrophils Absolute: 6 /uL
Platelets: 410 10*3/uL — AB (ref 150–399)
WBC: 9.5 10^3/mL

## 2014-07-08 LAB — HEPATIC FUNCTION PANEL
ALT: 19 U/L (ref 7–35)
AST: 20 U/L (ref 13–35)
Alkaline Phosphatase: 60 U/L (ref 25–125)
Bilirubin, Total: 0.4 mg/dL

## 2014-07-08 LAB — LIPID PANEL
Cholesterol: 196 mg/dL (ref 0–200)
HDL: 54 mg/dL (ref 35–70)
LDL CALC: 108 mg/dL
LDl/HDL Ratio: 2
Triglycerides: 172 mg/dL — AB (ref 40–160)

## 2014-07-08 LAB — BASIC METABOLIC PANEL
BUN: 15 mg/dL (ref 4–21)
CREATININE: 0.7 mg/dL (ref 0.5–1.1)
GLUCOSE: 102 mg/dL
Potassium: 4.9 mmol/L (ref 3.4–5.3)
Sodium: 143 mmol/L (ref 137–147)

## 2014-07-08 LAB — TSH: TSH: 1.83 u[IU]/mL (ref 0.41–5.90)

## 2014-07-12 NOTE — H&P (Signed)
PATIENT NAME:  Elizabeth Trujillo, Elizabeth Trujillo MR#:  [id number] DATE OF BIRTH:  March 12, 1936  DATE OF ADMISSION:  05/07/2014  REFERRING PHYSICIAN: Larae Grooms, MD    PRIMARY CARE PHYSICIAN: Richard L. Rosanna Randy, MD     CHIEF COMPLAINT: Speech difficulty.   HISTORY OF PRESENT ILLNESS: A 79 year old Caucasian female without significant past medical history presenting with slurred speech and word finding difficulty. She describes acute onset around 6:00 p.m. on 05/07/2014 of word finding difficulty as well as associated slurred/garbled speech. Total episode lasted about 5 minutes and now completely resolved. Denies any associated weakness or numbness. Presented to the hospital for further workup and evaluation, had a CT of the head, which reveals a left basal ganglia area potential ischemia.   REVIEW OF SYSTEMS:   CONSTITUTIONAL: Denies fevers, chills, fatigue, weakness.  EYES: Denies blurred vision, double vision, eye pain.  EARS, NOSE, THROAT: Denies tinnitus, ear pain, hearing loss.  RESPIRATORY:  Denies cough, wheeze, shortness of breath.    CARDIOVASCULAR: Denies chest pain, palpitations, edema.  GASTROINTESTINAL: Denies nausea, vomiting, diarrhea, abdominal pain.  GENITOURINARY: Denies any dysuria, hematuria.  ENDOCRINE: Denies nocturia, thyroid problems. HEMATOLOGIC AND LYMPHATIC:  Denies easy bruising or bleeding.  SKIN: Denies rash or lesion.  MUSCULOSKELETAL: Denies pain in neck, back, shoulder, knees, hips or arthritic symptoms.  NEUROLOGIC: Denies paralysis or paresthesias. Positive for dysarthria as stated above.  PSYCHIATRIC: Denies anxiety or depressive symptoms.  Otherwise full review of systems performed by me is negative.    PAST MEDICAL HISTORY: She is status post mastectomy about 3 years ago and no further medical issues.   SOCIAL HISTORY: Denies any alcohol, tobacco, or drug usage.   FAMILY HISTORY: Positive for TIAs and CVA in her mother.   ALLERGIES: PENICILLIN AND SULFA DRUGS.    HOME MEDICATIONS: Include aspirin 81 mg p.o. daily, Crestor 10 mg p.o. daily, Tricor 145 mg p.o. daily, ferrous sulfate 325 mg 2 tablets p.o. daily.   PHYSICAL EXAMINATION:  VITAL SIGNS: Temperature 97.4, heart rate 67, respirations 18, blood pressure 152/83, saturating 99% on room air, weight 93.4 kg, BMI of 36.5.  GENERAL: Well-nourished, well-developed Caucasian female currently in no acute distress.  HEAD: Normocephalic, atraumatic.  EYES: Pupils equal, round, react to light. Extraocular muscles intact. No scleral icterus.  MOUTH: Moist mucosal membrane. Dentition intact. No abscess noted.  EAR, NOSE, THROAT: Clear without exudates. No external lesions.  NECK: Supple. No thyromegaly. No nodules. No JVD.  PULMONARY: Clear to auscultation bilaterally without wheeze, rales, rhonchi. No use of accessory muscles. Good respiratory effort.  CHEST: Nontender to palpation.  CARDIOVASCULAR: S1, S2, regular rate and rhythm. No murmurs, rubs, or gallops. No edema. Pedal pulses 2+ bilaterally. GASTROINTESTINAL: Soft, nontender, nondistended. No masses. Positive bowel sounds. No hepatosplenomegaly.  MUSCULOSKELETAL: No swelling, clubbing, or edema. Range of motion full in all extremities.  NEUROLOGIC: Cranial nerves II through XII intact. No gross focal neurological deficits. Sensation intact. Reflexes intact. Pronator drift within normal limits.  Strength 5/5 in all extremities including proximal, distal flexion and extension.  SKIN: No ulceration, lesions, rash, or cyanosis. Skin warm and dry. Turgor intact.  PSYCHIATRIC: Mood and affect within normal limits. The patient is awake, alert and oriented x 3. Insight and judgment intact.   LABORATORY DATA: Sodium 143, potassium 4, chloride 109, bicarb 28, BUN 15, , glucose 108, albumin 3.2. Otherwise, LFTs within normal limits.  WBC of 11.8, hemoglobin 13.3, platelets 329. Chest x-ray performed which reveals no acute findings. CT head performed which  reveals rounded area of decreased attenuation within  the left basal ganglia suspicious for early ischemic change.   ASSESSMENT AND PLAN: A 79 year old Caucasian female without significant past medical history presenting with slurred speech.  1.  Cerebrovascular accident, unspecified mechanism. Place on telemetry. Initiate aspirin and statin therapy. Check a transthoracic echocardiogram, carotid Dopplers, MRI of the brain, neurological checks q. 4 hours, as well as lipid panel and hemoglobin A1c in searching for further etiology. Allow permissive hypertension treating only blood pressure greater than 220/120 for first 24 to 48 hours.  2.  Venous thromboembolic prophylaxis , sequential compression devices.   CODE STATUS: The patient is full code.    TIME SPENT: 35 minutes.   ____________________________ Aaron Mose. Hower, MD dkh:AT D: 05/07/2014 22:55:39 ET T: 05/07/2014 23:38:56 ET JOB#: [id number]  cc: Aaron Mose. Hower, MD, <Dictator> DAVID Woodfin Ganja MD ELECTRONICALLY SIGNED 05/08/2014 20:44

## 2014-07-12 NOTE — Discharge Summary (Signed)
PATIENT NAME:  Elizabeth Trujillo, Elizabeth Trujillo MR#:  [id number] DATE OF BIRTH:  1935/04/13  DATE OF ADMISSION:  05/07/2014 DATE OF DISCHARGE:  05/08/2014  ADMITTING PHYSICIAN: Aaron Mose. Hower, MD  DISCHARGING PHYSICIAN: Gladstone Lighter, MD  PRIMARY CARE PHYSICIAN: Richard L. Rosanna Randy, [place]: None.   DISCHARGE DIAGNOSES:  1.  Confusion, likely from urinary tract infection, transient ischemic attack ruled out.  2.  Hyperlipidemia.  3.  Breast cancer status post mastectomy.   DISCHARGE HOME MEDICATIONS:  1.  Crestor 10 mg p.o. daily.  2.  Aspirin 81 mg p.o. daily.  3.  Ferrous sulfate 325 mg 2 tablets once a day.  4.  TriCor 145 mg p.o. daily.  5.  Calcium with vitamin D 600 mg/200 international units 1 tablet p.o. b.i.d.  6.  Multivitamin 1 tablet p.o. b.i.d.  7.  Levaquin 250 mg p.o. daily for 4 days.  DISCHARGE DIET: Low fat, low cholesterol diet.   DISCHARGE ACTIVITY: As tolerated.    FOLLOWUP INSTRUCTIONS: PCP followup in 2 weeks.   LABORATORIES AND IMAGING STUDIES PRIOR TO DISCHARGE:  1.  WBC 11.5, hemoglobin 13.3, hematocrit 40.3, platelet count 338,000.  2.  Sodium 147, potassium 3.6, chloride 111, bicarbonate 27, BUN 15, creatinine 0.7, glucose 96, and calcium of 8.8.  3.  Urinalysis with 2+ leukocyte esterase, 12 WBCs, few bacteria noted.  4.  LDL cholesterol 75, HDL 42, total cholesterol 154, triglycerides 185.  5.  Echo Doppler showing LV ejection fraction is normal at 55% -60%.  6.  Carotid Doppler showing no significant atherosclerotic plaque or evidence of stenosis in either carotid arteries noted.  7.  MRI of the brain without contrast showing no acute intracranial abnormality. Minimal small vessel ischemic changes noted.  8.  INR is 1.0.  9.  CT head, which showed decreased attenuation area in left basal ganglia suspicious for ischemic change, was noted to be artifact based on MRI.   BRIEF HOSPITAL COURSE: Elizabeth Trujillo is a 79 year old Caucasian  female with past medical history significant for only for history of breast cancer and hyperlipidemia, presents to the hospital secondary to confusion, which was transient, associated with slurred speech, getting the words out right.   Initially admitted for TIA symptoms. No risk factors other than her cholesterol, which seemed to be very well controlled on statin and TriCor at this time. She had the MRI, carotid Dopplers, and echocardiogram done, which were negative for any stroke possibilities. Though the CT showed possible early ischemic change in the basal ganglion, it seemed to be an artifact based on MRI. However, the patient has slightly elevated white count. Urine looked like she has some WBCs, bacteria, and leukocyte esterase positivity. It could be UTI that could have caused her symptoms. So she is being discharged on Levaquin at this point. She is back to her baseline. All her other home medications are being continued without any changes.   DISCHARGE CONDITION: Stable.   DISCHARGE DISPOSITION: Home.   TIME SPENT ON DISCHARGE: 45 minutes.   ____________________________ Gladstone Lighter, MD rk:bm D: 05/08/2014 15:48:55 ET T: 05/09/2014 03:26:28 ET JOB#: [id number]  cc: Gladstone Lighter, MD, <Dictator> Richard L. Rosanna Randy, MD Gladstone Lighter MD ELECTRONICALLY SIGNED 05/28/2014 17:49

## 2014-09-16 DIAGNOSIS — M1711 Unilateral primary osteoarthritis, right knee: Secondary | ICD-10-CM | POA: Diagnosis not present

## 2014-09-21 DIAGNOSIS — M7071 Other bursitis of hip, right hip: Secondary | ICD-10-CM | POA: Diagnosis not present

## 2014-09-23 DIAGNOSIS — M1711 Unilateral primary osteoarthritis, right knee: Secondary | ICD-10-CM | POA: Diagnosis not present

## 2014-09-30 DIAGNOSIS — M1711 Unilateral primary osteoarthritis, right knee: Secondary | ICD-10-CM | POA: Diagnosis not present

## 2014-12-16 ENCOUNTER — Other Ambulatory Visit: Payer: Self-pay

## 2014-12-24 ENCOUNTER — Other Ambulatory Visit: Payer: Self-pay

## 2014-12-24 MED ORDER — ROSUVASTATIN CALCIUM 20 MG PO TABS: 20.0000 mg | ORAL_TABLET | Freq: Every day | ORAL | Status: DC

## 2014-12-24 MED ORDER — FENOFIBRATE 145 MG PO TABS: 145.0000 mg | ORAL_TABLET | Freq: Every day | ORAL | Status: DC

## 2015-01-06 ENCOUNTER — Encounter: Payer: Self-pay | Admitting: Emergency Medicine

## 2015-01-06 DIAGNOSIS — I10 Essential (primary) hypertension: Secondary | ICD-10-CM | POA: Insufficient documentation

## 2015-01-06 DIAGNOSIS — L309 Dermatitis, unspecified: Secondary | ICD-10-CM | POA: Insufficient documentation

## 2015-01-06 DIAGNOSIS — C50919 Malignant neoplasm of unspecified site of unspecified female breast: Secondary | ICD-10-CM | POA: Insufficient documentation

## 2015-01-06 DIAGNOSIS — I999 Unspecified disorder of circulatory system: Secondary | ICD-10-CM | POA: Insufficient documentation

## 2015-01-06 DIAGNOSIS — F4489 Other dissociative and conversion disorders: Secondary | ICD-10-CM | POA: Insufficient documentation

## 2015-01-06 DIAGNOSIS — E7800 Pure hypercholesterolemia, unspecified: Secondary | ICD-10-CM | POA: Insufficient documentation

## 2015-01-06 DIAGNOSIS — M1990 Unspecified osteoarthritis, unspecified site: Secondary | ICD-10-CM | POA: Insufficient documentation

## 2015-01-06 DIAGNOSIS — E559 Vitamin D deficiency, unspecified: Secondary | ICD-10-CM | POA: Insufficient documentation

## 2015-01-06 DIAGNOSIS — E669 Obesity, unspecified: Secondary | ICD-10-CM | POA: Insufficient documentation

## 2015-01-06 DIAGNOSIS — M8580 Other specified disorders of bone density and structure, unspecified site: Secondary | ICD-10-CM | POA: Insufficient documentation

## 2015-01-06 DIAGNOSIS — J309 Allergic rhinitis, unspecified: Secondary | ICD-10-CM | POA: Insufficient documentation

## 2015-01-14 ENCOUNTER — Ambulatory Visit (INDEPENDENT_AMBULATORY_CARE_PROVIDER_SITE_OTHER): Payer: Medicare Other | Admitting: Family Medicine

## 2015-01-14 VITALS — BP 142/64 | HR 72 | Temp 98.0°F | Resp 16 | Ht 63.5 in | Wt 206.0 lb

## 2015-01-14 DIAGNOSIS — Z23 Encounter for immunization: Secondary | ICD-10-CM | POA: Diagnosis not present

## 2015-01-14 DIAGNOSIS — Z0000 Encounter for general adult medical examination without abnormal findings: Secondary | ICD-10-CM

## 2015-01-14 DIAGNOSIS — Z1211 Encounter for screening for malignant neoplasm of colon: Secondary | ICD-10-CM

## 2015-01-14 NOTE — Progress Notes (Signed)
Patient ID: Elizabeth Trujillo, female   DOB: Mar 04, 1936, 79 y.o.   MRN: [id number] [name], Female    DOB: Sep 03, 1935, 79 y.o.   MRN: [id number] Visit Date: 01/14/2015  Today's Provider: [name], MD   Chief Complaint  Patient presents with  . Annual Exam   Subjective:   Elizabeth Trujillo is a 79 y.o. female who presents today for her Subsequent Annual Wellness Visit. She feels fairly well. She reports exercising none.. She reports she is sleeping fairly well.  Review of Systems  Constitutional: Negative for fever, chills, diaphoresis, activity change, appetite change, fatigue and unexpected weight change.  HENT: Negative for congestion, dental problem, drooling, ear discharge, ear pain, facial swelling, hearing loss, mouth sores, nosebleeds, postnasal drip, rhinorrhea, sinus pressure, sneezing, sore throat, tinnitus, trouble swallowing and voice change.   Eyes: Negative for photophobia, pain, discharge, redness, itching and visual disturbance.  Respiratory: Negative for apnea, cough, choking, chest tightness, shortness of breath, wheezing and stridor.   Cardiovascular: Negative for chest pain, palpitations and leg swelling.  Gastrointestinal: Negative for nausea, vomiting, abdominal pain, diarrhea, constipation, blood in stool, abdominal distention, anal bleeding and rectal pain.  Endocrine: Negative for cold intolerance, heat intolerance, polydipsia, polyphagia and polyuria.  Genitourinary: Negative for dysuria, urgency, frequency, hematuria, flank pain, decreased urine volume, vaginal bleeding, vaginal discharge, enuresis, difficulty urinating, genital sores, vaginal pain, menstrual problem, pelvic pain and dyspareunia.  Musculoskeletal: Negative for myalgias, back pain, joint swelling, arthralgias, gait problem, neck pain and neck stiffness.  Skin: Negative for color change, pallor, rash and wound.  Allergic/Immunologic: Negative for environmental  allergies, food allergies and immunocompromised state.  Neurological: Negative for dizziness, tremors, seizures, syncope, facial asymmetry, speech difficulty, weakness, light-headedness, numbness and headaches.  Hematological: Negative for adenopathy. Does not bruise/bleed easily.  Psychiatric/Behavioral: Negative for suicidal ideas, hallucinations, behavioral problems, confusion, sleep disturbance, self-injury, dysphoric mood, decreased concentration and agitation. The patient is not nervous/anxious and is not hyperactive.     Patient Active Problem List   Diagnosis Date Noted  . Allergic rhinitis 01/06/2015  . Confusion state 01/06/2015  . Dermatitis, eczematoid 01/06/2015  . Episode of hypertension 01/06/2015  . Hypercholesterolemia 01/06/2015  . Angiopathy 01/06/2015  . Malignant neoplasm of breast ([place]) 01/06/2015  . Adiposity 01/06/2015  . Osteoarthritis 01/06/2015  . Osteopenia 01/06/2015  . Avitaminosis D 01/06/2015    Social History   Social History  . Marital Status: Married    Spouse Name: N/A  . Number of Children: N/A  . Years of Education: N/A   Occupational History  . Not on file.   Social History Main Topics  . Smoking status: Never Smoker   . Smokeless tobacco: Not on file  . Alcohol Use: No  . Drug Use: No  . Sexual Activity: Not on file   Other Topics Concern  . Not on file   Social History Narrative  . No narrative on file    Past Surgical History  Procedure Laterality Date  . Foot surgery Left 04/2013  . Abdominal hysterectomy  [id number]    TAH/BSO  . Breast surgery Left 07/1984    Mastectomy    Her family history includes Arthritis in her mother; Cancer in her brother and mother; Glaucoma in her mother; Heart disease in her father; Kidney disease in her mother; Stroke in her mother.    Outpatient Prescriptions Prior to Visit  Medication Sig Dispense Refill  . aspirin 81 MG tablet Take by mouth.    Marland Kitchen  CALCIUM-VITAMIN D PO Take by mouth.     . Cholecalciferol 1000 UNITS capsule Take by mouth.    . fenofibrate (TRICOR) 145 MG tablet Take 1 tablet (145 mg total) by mouth daily. 90 tablet 1  . MULTIPLE VITAMIN PO Take by mouth.    . OMEGA-3 FATTY ACIDS PO Take by mouth.    . rosuvastatin (CRESTOR) 20 MG tablet Take 1 tablet (20 mg total) by mouth daily. 90 tablet 1   No facility-administered medications prior to visit.    Allergies  Allergen Reactions  . Penicillin V   . Sulfa Antibiotics     Patient Care Team: [name]., MD as PCP - General (Family Medicine)  Objective:   Vitals: There were no vitals filed for this visit.  Physical Exam  Constitutional: She is oriented to person, place, and time. She appears well-developed and well-nourished.  Morbidly obese.  HENT:  Head: Normocephalic and atraumatic.  Right Ear: External ear normal.  Left Ear: External ear normal.  Nose: Nose normal.  Mouth/Throat: Oropharynx is clear and moist.  Eyes: Conjunctivae and EOM are normal. Pupils are equal, round, and reactive to light.  Neck: Normal range of motion. Neck supple.  Cardiovascular: Normal rate, regular rhythm, normal heart sounds and intact distal pulses.   Pulmonary/Chest: Effort normal and breath sounds normal.  Left breast surgically absent. No abnormalities in scar. Right breast normal exam.  Abdominal: Soft. Bowel sounds are normal.  Musculoskeletal: Normal range of motion.  Neurological: She is alert and oriented to person, place, and time.  Skin: Skin is warm and dry.  Psychiatric: She has a normal mood and affect. Her behavior is normal. Judgment and thought content normal.    Activities of Daily Living In your present state of health, do you have any difficulty performing the following activities: 01/14/2015  Hearing? N  Vision? N  Difficulty concentrating or making decisions? N  Walking or climbing stairs? Y  Dressing or bathing? N  Doing errands, shopping? N    Fall Risk  Assessment Fall Risk  01/14/2015  Falls in the past year? No     Depression Screen PHQ 2/9 Scores 01/14/2015  PHQ - 2 Score 0    Cognitive Testing - 6-CIT    Year: 0 4 points  Month: 0 3 points  Memorize "Pia Mau, [address] [place]"  Time (within 1 hour:) 0 3 points  Count backwards from 20: 0 2 4 points 2  Name months of year: 0 2 4 points  Repeat Address: 0 2 4 6 8 10  points   Total Score: 8/28  Interpretation : Normal (0-7) Abnormal (8-28)    Assessment & Plan:     Annual Wellness Visit  Reviewed patient's Family Medical History Reviewed and updated list of patient's medical providers Assessment of cognitive impairment was done Assessed patient's functional ability Established a written schedule for health screening [place] Completed and Reviewed  Exercise Activities and Dietary recommendations Goals    None      Immunization History  Administered Date(s) Administered  . Pneumococcal Conjugate-13 09/08/2013  . Pneumococcal Polysaccharide-23 01/04/2011    Health Maintenance  Topic Date Due  . TETANUS/TDAP  06/06/1956  . ZOSTAVAX  06/06/1997  . INFLUENZA VACCINE  10/12/2014  . DEXA SCAN  Completed  . PNA vac Low Risk Adult  Completed      Discussed health benefits of physical activity, and encouraged her to engage in regular exercise appropriate for her  age and condition.   Knee OA Recommend PT referral as her pain is only getting in and out of chair. No weight bearing pain. Breast Cancer Morbid Obesity  I have done the exam and reviewed the above chart and it is accurate to the best of my knowledge.  Miguel Aschoff MD [facility] 01/14/2015 2:28 PM  ------------------------------------------------------------------------------------------------------------    2

## 2015-01-15 ENCOUNTER — Encounter: Payer: Self-pay | Admitting: Family Medicine

## 2015-01-15 ENCOUNTER — Encounter: Payer: Self-pay | Admitting: Gastroenterology

## 2015-01-15 NOTE — Telephone Encounter (Signed)
error 

## 2015-04-12 ENCOUNTER — Ambulatory Visit (INDEPENDENT_AMBULATORY_CARE_PROVIDER_SITE_OTHER): Payer: Medicare Other | Admitting: Family Medicine

## 2015-04-12 ENCOUNTER — Encounter: Payer: Self-pay | Admitting: Family Medicine

## 2015-04-12 VITALS — BP 136/72 | HR 76 | Temp 97.8°F | Resp 16 | Wt 213.0 lb

## 2015-04-12 DIAGNOSIS — Z1211 Encounter for screening for malignant neoplasm of colon: Secondary | ICD-10-CM

## 2015-04-12 DIAGNOSIS — I10 Essential (primary) hypertension: Secondary | ICD-10-CM | POA: Diagnosis not present

## 2015-04-12 DIAGNOSIS — E669 Obesity, unspecified: Secondary | ICD-10-CM | POA: Diagnosis not present

## 2015-04-12 DIAGNOSIS — E7800 Pure hypercholesterolemia, unspecified: Secondary | ICD-10-CM | POA: Diagnosis not present

## 2015-04-12 MED ORDER — FENOFIBRATE 145 MG PO TABS: 145.0000 mg | ORAL_TABLET | Freq: Every day | ORAL | Status: DC

## 2015-04-12 MED ORDER — ROSUVASTATIN CALCIUM 20 MG PO TABS: 20.0000 mg | ORAL_TABLET | Freq: Every day | ORAL | Status: DC

## 2015-04-12 NOTE — Progress Notes (Signed)
Patient ID: Elizabeth Trujillo, female   DOB: [birth year], 80 y.o.   MRN: [id number]    Subjective:  HPI  Patient is here for 2 months follow up. Her last OV in November 2016 for Medicare wellness visit.  Hyperlipidemia: Patient is taking Crestor and Fenofibrate. Lab Results  Component Value Date   CHOL 196 07/08/2014   HDL 54 07/08/2014   LDLCALC 108 07/08/2014   TRIG 172* 07/08/2014   Patient also wanted to discuss with [place] if she should get colonoscopy at her age or not. We referred her to Dr. Allen Norris and when the lady called from his office to get information she advised patient usually they do not recommend patient's over 50 years of age to get this done anymore unless they have problems. Patient wanted to hold off until she spoke to you today about it so that was not scheduled. She has not had any GI issues or stools, she states over the past few years her stool just seems more moist-not diarrhea or anything , but just takesm ore to wipe after a bowel movement, she does not consider this an issue but wanted to mention.  Also she has had some pelvic discomfort for a few days and she was feeling anxious if this is a UTI. She states 2 years ago she had a TIA and was advised a UTI contributed to this so she wanted to see if she would be qualified to have her UA checked today. She states she usually does not have UTI symptoms when she does have an infection like one time before but did not want to end up having more serious issues from this if she has it.  Prior to Admission medications   Medication Sig Start Date End Date Taking? Authorizing Provider  aspirin 81 MG tablet Take by mouth. 12/12/12  Yes Historical Provider, MD  CALCIUM-VITAMIN D PO Take by mouth. 12/12/12  Yes Historical Provider, MD  Cholecalciferol 1000 UNITS capsule Take by mouth. 12/12/12  Yes Historical Provider, MD  fenofibrate (TRICOR) 145 MG tablet Take 1 tablet (145 mg total) by mouth daily. 12/24/14  Yes Richard Maceo Pro., MD  MULTIPLE VITAMIN PO Take by mouth. 12/12/12  Yes Historical Provider, MD  OMEGA-3 FATTY ACIDS PO Take by mouth. 12/12/12  Yes Historical Provider, MD  rosuvastatin (CRESTOR) 20 MG tablet Take 1 tablet (20 mg total) by mouth daily. 12/24/14  Yes Richard Maceo Pro., MD    Patient Active Problem List   Diagnosis Date Noted  . Allergic rhinitis 01/06/2015  . Confusion state 01/06/2015  . Dermatitis, eczematoid 01/06/2015  . Episode of hypertension 01/06/2015  . Hypercholesterolemia 01/06/2015  . Angiopathy 01/06/2015  . Malignant neoplasm of breast ([place]) 01/06/2015  . Adiposity 01/06/2015  . Osteoarthritis 01/06/2015  . Osteopenia 01/06/2015  . Avitaminosis D 01/06/2015    No past medical history on file.  Social History   Social History  . Marital Status: Married    Spouse Name: N/A  . Number of Children: N/A  . Years of Education: N/A   Occupational History  . Not on file.   Social History Main Topics  . Smoking status: Never Smoker   . Smokeless tobacco: Never Used  . Alcohol Use: No  . Drug Use: No  . Sexual Activity: Not on file   Other Topics Concern  . Not on file   Social History Narrative    Allergies  Allergen Reactions  . Penicillin V   .  Penicillin V Potassium Hives  . Sulfa Antibiotics Hives    Review of Systems  Constitutional: Negative.   Respiratory: Negative.   Cardiovascular: Negative.   Gastrointestinal: Negative.   Genitourinary: Positive for flank pain (more of a discomfort).  Musculoskeletal: Positive for joint pain (chronic knee pain).    Immunization History  Administered Date(s) Administered  . Influenza, High Dose Seasonal PF 01/14/2015  . Pneumococcal Conjugate-13 09/08/2013  . Pneumococcal Polysaccharide-23 01/04/2011   Objective:  BP 136/72 mmHg  Pulse 76  Temp(Src) 97.8 F (36.6 C)  Resp 16  Wt 213 lb (96.616 kg)  Physical Exam  Constitutional: She is oriented to person, place, and time and  well-developed, well-nourished, and in no distress.  HENT:  Head: Normocephalic and atraumatic.  Right Ear: External ear normal.  Left Ear: External ear normal.  Nose: Nose normal.  Eyes: Conjunctivae are normal.  Cardiovascular: Normal rate, regular rhythm and normal heart sounds.   Pulmonary/Chest: Effort normal and breath sounds normal.  Abdominal: Soft.  Musculoskeletal: She exhibits edema.  1+ lower extremity edema low the knees.  Neurological: She is alert and oriented to person, place, and time.  Skin: Skin is warm and dry.  Psychiatric: Mood, memory, affect and judgment normal.    Lab Results  Component Value Date   WBC 9.5 07/08/2014   HGB 14.8 07/08/2014   HCT 44 07/08/2014   PLT 410* 07/08/2014   CHOL 196 07/08/2014   TRIG 172* 07/08/2014   HDL 54 07/08/2014   LDLCALC 108 07/08/2014   TSH 1.83 07/08/2014    CMP     Component Value Date/Time   NA 143 07/08/2014   K 4.9 07/08/2014   BUN 15 07/08/2014   CREATININE 0.7 07/08/2014   AST 20 07/08/2014   ALT 19 07/08/2014   ALKPHOS 60 07/08/2014    Assessment and Plan :  1. Hypercholesterolemia  - rosuvastatin (CRESTOR) 20 MG tablet; Take 1 tablet (20 mg total) by mouth daily.  Dispense: 90 tablet; Refill: 3 - fenofibrate (TRICOR) 145 MG tablet; Take 1 tablet (145 mg total) by mouth daily.  Dispense: 90 tablet; Refill: 3 - Comprehensive metabolic panel - Lipid Panel With LDL/HDL Ratio  2. Adiposity  - CBC with Differential/Platelet  3. Episode of hypertension  - CBC with Differential/Platelet - TSH  4. Colon cancer screening Discussed this at length with patient.colonoscopy is the gold standard for screening for colon cancer but the patient does not like the prep. Certainly cologard is a reasonable alternative in this 80 year old. - Cologuard 5. Mild venous insufficiency I do not think that diuretics are at all warranted. Would actually start with simple support hose. The mild edema is there only at  the end of the day. I will be surprised a support hose don't give her complete relief. 6. History of breast cancer I have done the exam and reviewed the above chart and it is accurate to the best of my knowledge.   Miguel Aschoff MD [facility] 04/12/2015 1:51 PM

## 2015-04-13 ENCOUNTER — Telehealth: Payer: Self-pay

## 2015-04-13 ENCOUNTER — Ambulatory Visit: Payer: Medicare Other | Admitting: Family Medicine

## 2015-04-13 LAB — CBC WITH DIFFERENTIAL/PLATELET
BASOS: 1 %
Basophils Absolute: 0.1 10*3/uL (ref 0.0–0.2)
EOS (ABSOLUTE): 0.3 10*3/uL (ref 0.0–0.4)
EOS: 3 %
HEMATOCRIT: 41.9 % (ref 34.0–46.6)
Hemoglobin: 14.3 g/dL (ref 11.1–15.9)
IMMATURE GRANULOCYTES: 0 %
Immature Grans (Abs): 0 10*3/uL (ref 0.0–0.1)
LYMPHS ABS: 2.5 10*3/uL (ref 0.7–3.1)
Lymphs: 25 %
MCH: 30.6 pg (ref 26.6–33.0)
MCHC: 34.1 g/dL (ref 31.5–35.7)
MCV: 90 fL (ref 79–97)
MONOS ABS: 0.9 10*3/uL (ref 0.1–0.9)
Monocytes: 10 %
NEUTROS ABS: 5.9 10*3/uL (ref 1.4–7.0)
Neutrophils: 61 %
Platelets: 387 10*3/uL — ABNORMAL HIGH (ref 150–379)
RBC: 4.68 x10E6/uL (ref 3.77–5.28)
RDW: 14 % (ref 12.3–15.4)
WBC: 9.8 10*3/uL (ref 3.4–10.8)

## 2015-04-13 LAB — COMPREHENSIVE METABOLIC PANEL
A/G RATIO: 1.6 (ref 1.1–2.5)
ALT: 25 IU/L (ref 0–32)
AST: 25 IU/L (ref 0–40)
Albumin: 4.1 g/dL (ref 3.5–4.8)
Alkaline Phosphatase: 59 IU/L (ref 39–117)
BILIRUBIN TOTAL: 0.4 mg/dL (ref 0.0–1.2)
BUN/Creatinine Ratio: 20 (ref 11–26)
BUN: 14 mg/dL (ref 8–27)
CALCIUM: 9.7 mg/dL (ref 8.7–10.3)
CO2: 25 mmol/L (ref 18–29)
Chloride: 106 mmol/L (ref 96–106)
Creatinine, Ser: 0.71 mg/dL (ref 0.57–1.00)
GFR calc Af Amer: 95 mL/min/{1.73_m2} (ref >59)
GFR, EST NON AFRICAN AMERICAN: 82 mL/min/{1.73_m2} (ref >59)
GLOBULIN, TOTAL: 2.6 g/dL (ref 1.5–4.5)
Glucose: 91 mg/dL (ref 65–99)
POTASSIUM: 4.5 mmol/L (ref 3.5–5.2)
SODIUM: 146 mmol/L — AB (ref 134–144)
Total Protein: 6.7 g/dL (ref 6.0–8.5)

## 2015-04-13 LAB — TSH: TSH: 2.12 u[IU]/mL (ref 0.450–4.500)

## 2015-04-13 LAB — LIPID PANEL WITH LDL/HDL RATIO
Cholesterol, Total: 190 mg/dL (ref 100–199)
HDL: 52 mg/dL (ref >39)
LDL Calculated: 101 mg/dL — ABNORMAL HIGH (ref 0–99)
LDL/HDL RATIO: 1.9 ratio (ref 0.0–3.2)
TRIGLYCERIDES: 183 mg/dL — AB (ref 0–149)
VLDL Cholesterol Cal: 37 mg/dL (ref 5–40)

## 2015-04-13 NOTE — Telephone Encounter (Signed)
Left message to call back  

## 2015-04-13 NOTE — Telephone Encounter (Signed)
-----   Message from [name]., MD sent at 04/13/2015  7:44 AM EST ----- Labs stable

## 2015-04-14 DIAGNOSIS — R262 Difficulty in walking, not elsewhere classified: Secondary | ICD-10-CM | POA: Diagnosis not present

## 2015-04-14 DIAGNOSIS — M25562 Pain in left knee: Secondary | ICD-10-CM | POA: Diagnosis not present

## 2015-04-14 DIAGNOSIS — M25561 Pain in right knee: Secondary | ICD-10-CM | POA: Diagnosis not present

## 2015-04-14 DIAGNOSIS — R531 Weakness: Secondary | ICD-10-CM | POA: Diagnosis not present

## 2015-04-15 NOTE — Telephone Encounter (Signed)
Pt advised-aa 

## 2015-04-20 DIAGNOSIS — M25561 Pain in right knee: Secondary | ICD-10-CM | POA: Diagnosis not present

## 2015-04-20 DIAGNOSIS — R262 Difficulty in walking, not elsewhere classified: Secondary | ICD-10-CM | POA: Diagnosis not present

## 2015-04-20 DIAGNOSIS — R531 Weakness: Secondary | ICD-10-CM | POA: Diagnosis not present

## 2015-04-20 DIAGNOSIS — M25562 Pain in left knee: Secondary | ICD-10-CM | POA: Diagnosis not present

## 2015-04-22 DIAGNOSIS — R531 Weakness: Secondary | ICD-10-CM | POA: Diagnosis not present

## 2015-04-22 DIAGNOSIS — R262 Difficulty in walking, not elsewhere classified: Secondary | ICD-10-CM | POA: Diagnosis not present

## 2015-04-22 DIAGNOSIS — M25562 Pain in left knee: Secondary | ICD-10-CM | POA: Diagnosis not present

## 2015-04-22 DIAGNOSIS — M25561 Pain in right knee: Secondary | ICD-10-CM | POA: Diagnosis not present

## 2015-04-27 DIAGNOSIS — M25562 Pain in left knee: Secondary | ICD-10-CM | POA: Diagnosis not present

## 2015-04-27 DIAGNOSIS — R531 Weakness: Secondary | ICD-10-CM | POA: Diagnosis not present

## 2015-04-27 DIAGNOSIS — M25561 Pain in right knee: Secondary | ICD-10-CM | POA: Diagnosis not present

## 2015-04-27 DIAGNOSIS — R262 Difficulty in walking, not elsewhere classified: Secondary | ICD-10-CM | POA: Diagnosis not present

## 2015-04-28 DIAGNOSIS — Z1211 Encounter for screening for malignant neoplasm of colon: Secondary | ICD-10-CM | POA: Diagnosis not present

## 2015-04-28 DIAGNOSIS — Z1212 Encounter for screening for malignant neoplasm of rectum: Secondary | ICD-10-CM | POA: Diagnosis not present

## 2015-04-28 LAB — COLOGUARD

## 2015-05-04 DIAGNOSIS — M25562 Pain in left knee: Secondary | ICD-10-CM | POA: Diagnosis not present

## 2015-05-04 DIAGNOSIS — M25561 Pain in right knee: Secondary | ICD-10-CM | POA: Diagnosis not present

## 2015-05-04 DIAGNOSIS — R531 Weakness: Secondary | ICD-10-CM | POA: Diagnosis not present

## 2015-05-04 DIAGNOSIS — R262 Difficulty in walking, not elsewhere classified: Secondary | ICD-10-CM | POA: Diagnosis not present

## 2015-05-06 DIAGNOSIS — R531 Weakness: Secondary | ICD-10-CM | POA: Diagnosis not present

## 2015-05-06 DIAGNOSIS — M25561 Pain in right knee: Secondary | ICD-10-CM | POA: Diagnosis not present

## 2015-05-06 DIAGNOSIS — M25562 Pain in left knee: Secondary | ICD-10-CM | POA: Diagnosis not present

## 2015-05-06 DIAGNOSIS — R262 Difficulty in walking, not elsewhere classified: Secondary | ICD-10-CM | POA: Diagnosis not present

## 2015-05-10 ENCOUNTER — Telehealth: Payer: Self-pay

## 2015-05-10 NOTE — Telephone Encounter (Signed)
Advised patient that cologuard result was negative. -aa

## 2015-05-11 DIAGNOSIS — R262 Difficulty in walking, not elsewhere classified: Secondary | ICD-10-CM | POA: Diagnosis not present

## 2015-05-11 DIAGNOSIS — M25562 Pain in left knee: Secondary | ICD-10-CM | POA: Diagnosis not present

## 2015-05-11 DIAGNOSIS — R531 Weakness: Secondary | ICD-10-CM | POA: Diagnosis not present

## 2015-05-11 DIAGNOSIS — M25561 Pain in right knee: Secondary | ICD-10-CM | POA: Diagnosis not present

## 2015-05-12 DIAGNOSIS — M25562 Pain in left knee: Secondary | ICD-10-CM | POA: Diagnosis not present

## 2015-05-12 DIAGNOSIS — R531 Weakness: Secondary | ICD-10-CM | POA: Diagnosis not present

## 2015-05-12 DIAGNOSIS — M25561 Pain in right knee: Secondary | ICD-10-CM | POA: Diagnosis not present

## 2015-05-12 DIAGNOSIS — R262 Difficulty in walking, not elsewhere classified: Secondary | ICD-10-CM | POA: Diagnosis not present

## 2015-05-13 DIAGNOSIS — M25562 Pain in left knee: Secondary | ICD-10-CM | POA: Diagnosis not present

## 2015-05-13 DIAGNOSIS — R262 Difficulty in walking, not elsewhere classified: Secondary | ICD-10-CM | POA: Diagnosis not present

## 2015-05-13 DIAGNOSIS — M25561 Pain in right knee: Secondary | ICD-10-CM | POA: Diagnosis not present

## 2015-05-13 DIAGNOSIS — R531 Weakness: Secondary | ICD-10-CM | POA: Diagnosis not present

## 2015-05-18 DIAGNOSIS — M25561 Pain in right knee: Secondary | ICD-10-CM | POA: Diagnosis not present

## 2015-05-18 DIAGNOSIS — M25562 Pain in left knee: Secondary | ICD-10-CM | POA: Diagnosis not present

## 2015-05-18 DIAGNOSIS — R531 Weakness: Secondary | ICD-10-CM | POA: Diagnosis not present

## 2015-05-18 DIAGNOSIS — R262 Difficulty in walking, not elsewhere classified: Secondary | ICD-10-CM | POA: Diagnosis not present

## 2015-05-20 DIAGNOSIS — M25561 Pain in right knee: Secondary | ICD-10-CM | POA: Diagnosis not present

## 2015-05-20 DIAGNOSIS — R262 Difficulty in walking, not elsewhere classified: Secondary | ICD-10-CM | POA: Diagnosis not present

## 2015-05-20 DIAGNOSIS — M25562 Pain in left knee: Secondary | ICD-10-CM | POA: Diagnosis not present

## 2015-05-20 DIAGNOSIS — R531 Weakness: Secondary | ICD-10-CM | POA: Diagnosis not present

## 2015-05-25 DIAGNOSIS — M25561 Pain in right knee: Secondary | ICD-10-CM | POA: Diagnosis not present

## 2015-05-25 DIAGNOSIS — R531 Weakness: Secondary | ICD-10-CM | POA: Diagnosis not present

## 2015-05-25 DIAGNOSIS — R262 Difficulty in walking, not elsewhere classified: Secondary | ICD-10-CM | POA: Diagnosis not present

## 2015-05-25 DIAGNOSIS — M25562 Pain in left knee: Secondary | ICD-10-CM | POA: Diagnosis not present

## 2015-05-26 ENCOUNTER — Telehealth: Payer: Self-pay | Admitting: Family Medicine

## 2015-05-26 DIAGNOSIS — M25519 Pain in unspecified shoulder: Secondary | ICD-10-CM

## 2015-05-26 NOTE — Telephone Encounter (Signed)
Pt informed. She was reluctant to go to ortho first for this. But said that she would call Dr. Clydell Hakim office as she has seen him recently for her knee. I ordered her referral incase she needed it to see them again.

## 2015-05-26 NOTE — Telephone Encounter (Signed)
Ok to refer to Ortho.

## 2015-05-26 NOTE — Telephone Encounter (Signed)
Please review. Thanks!  

## 2015-05-26 NOTE — Telephone Encounter (Signed)
Pt called saying she is taking physical therapy for her knees.  She is now having shoulder pain and wants to know if you could write for her to have pt for left shoulder also.  I told her she may have to be seen first.  There fax is [facility] [phone]  Her call back is [phone]

## 2015-05-27 DIAGNOSIS — M25562 Pain in left knee: Secondary | ICD-10-CM | POA: Diagnosis not present

## 2015-05-27 DIAGNOSIS — R262 Difficulty in walking, not elsewhere classified: Secondary | ICD-10-CM | POA: Diagnosis not present

## 2015-05-27 DIAGNOSIS — R531 Weakness: Secondary | ICD-10-CM | POA: Diagnosis not present

## 2015-05-27 DIAGNOSIS — M25561 Pain in right knee: Secondary | ICD-10-CM | POA: Diagnosis not present

## 2015-06-02 DIAGNOSIS — M1711 Unilateral primary osteoarthritis, right knee: Secondary | ICD-10-CM | POA: Diagnosis not present

## 2015-06-02 DIAGNOSIS — M898X1 Other specified disorders of bone, shoulder: Secondary | ICD-10-CM | POA: Diagnosis not present

## 2015-06-03 DIAGNOSIS — R531 Weakness: Secondary | ICD-10-CM | POA: Diagnosis not present

## 2015-06-03 DIAGNOSIS — R262 Difficulty in walking, not elsewhere classified: Secondary | ICD-10-CM | POA: Diagnosis not present

## 2015-06-03 DIAGNOSIS — M25562 Pain in left knee: Secondary | ICD-10-CM | POA: Diagnosis not present

## 2015-06-03 DIAGNOSIS — M25561 Pain in right knee: Secondary | ICD-10-CM | POA: Diagnosis not present

## 2015-06-08 DIAGNOSIS — M25562 Pain in left knee: Secondary | ICD-10-CM | POA: Diagnosis not present

## 2015-06-08 DIAGNOSIS — M25561 Pain in right knee: Secondary | ICD-10-CM | POA: Diagnosis not present

## 2015-06-08 DIAGNOSIS — R262 Difficulty in walking, not elsewhere classified: Secondary | ICD-10-CM | POA: Diagnosis not present

## 2015-06-08 DIAGNOSIS — R531 Weakness: Secondary | ICD-10-CM | POA: Diagnosis not present

## 2015-06-10 DIAGNOSIS — M25561 Pain in right knee: Secondary | ICD-10-CM | POA: Diagnosis not present

## 2015-06-10 DIAGNOSIS — R262 Difficulty in walking, not elsewhere classified: Secondary | ICD-10-CM | POA: Diagnosis not present

## 2015-06-10 DIAGNOSIS — R531 Weakness: Secondary | ICD-10-CM | POA: Diagnosis not present

## 2015-06-10 DIAGNOSIS — M25562 Pain in left knee: Secondary | ICD-10-CM | POA: Diagnosis not present

## 2015-06-12 DIAGNOSIS — M25561 Pain in right knee: Secondary | ICD-10-CM | POA: Diagnosis not present

## 2015-06-12 DIAGNOSIS — R262 Difficulty in walking, not elsewhere classified: Secondary | ICD-10-CM | POA: Diagnosis not present

## 2015-06-12 DIAGNOSIS — M25562 Pain in left knee: Secondary | ICD-10-CM | POA: Diagnosis not present

## 2015-06-12 DIAGNOSIS — R531 Weakness: Secondary | ICD-10-CM | POA: Diagnosis not present

## 2015-06-15 DIAGNOSIS — R262 Difficulty in walking, not elsewhere classified: Secondary | ICD-10-CM | POA: Diagnosis not present

## 2015-06-15 DIAGNOSIS — M25561 Pain in right knee: Secondary | ICD-10-CM | POA: Diagnosis not present

## 2015-06-15 DIAGNOSIS — M25562 Pain in left knee: Secondary | ICD-10-CM | POA: Diagnosis not present

## 2015-06-15 DIAGNOSIS — R531 Weakness: Secondary | ICD-10-CM | POA: Diagnosis not present

## 2015-06-29 DIAGNOSIS — R531 Weakness: Secondary | ICD-10-CM | POA: Diagnosis not present

## 2015-06-29 DIAGNOSIS — M25562 Pain in left knee: Secondary | ICD-10-CM | POA: Diagnosis not present

## 2015-06-29 DIAGNOSIS — R262 Difficulty in walking, not elsewhere classified: Secondary | ICD-10-CM | POA: Diagnosis not present

## 2015-06-29 DIAGNOSIS — M25561 Pain in right knee: Secondary | ICD-10-CM | POA: Diagnosis not present

## 2015-08-24 DIAGNOSIS — G8929 Other chronic pain: Secondary | ICD-10-CM | POA: Diagnosis not present

## 2015-08-24 DIAGNOSIS — M25561 Pain in right knee: Secondary | ICD-10-CM | POA: Diagnosis not present

## 2015-08-24 DIAGNOSIS — M1711 Unilateral primary osteoarthritis, right knee: Secondary | ICD-10-CM | POA: Diagnosis not present

## 2015-09-28 ENCOUNTER — Ambulatory Visit (INDEPENDENT_AMBULATORY_CARE_PROVIDER_SITE_OTHER): Payer: Medicare Other | Admitting: Family Medicine

## 2015-09-28 ENCOUNTER — Encounter: Payer: Self-pay | Admitting: Family Medicine

## 2015-09-28 VITALS — BP 110/78 | HR 84 | Temp 97.5°F | Resp 16 | Wt 207.0 lb

## 2015-09-28 DIAGNOSIS — M1990 Unspecified osteoarthritis, unspecified site: Secondary | ICD-10-CM | POA: Diagnosis not present

## 2015-09-28 DIAGNOSIS — E7800 Pure hypercholesterolemia, unspecified: Secondary | ICD-10-CM | POA: Diagnosis not present

## 2015-09-28 DIAGNOSIS — M25569 Pain in unspecified knee: Secondary | ICD-10-CM | POA: Insufficient documentation

## 2015-09-28 DIAGNOSIS — M25561 Pain in right knee: Secondary | ICD-10-CM

## 2015-09-28 DIAGNOSIS — M791 Myalgia, unspecified site: Secondary | ICD-10-CM | POA: Insufficient documentation

## 2015-09-28 NOTE — Progress Notes (Signed)
[name] Female    DOB: 1935/10/23   80 y.o.   MRN: [id number] Visit Date: 09/28/2015  Today's Provider: [name], MD   Chief Complaint  Patient presents with  . Hyperlipidemia  . Knee Pain   Subjective:    HPI      Lipid/Cholesterol, Follow-up:   Last seen for this 6 months ago.  Management changes since that visit include continuing Tricor 145 mg and Crestor 20 mg. . Last Lipid Panel:    Component Value Date/Time   CHOL 190 04/12/2015 1500   CHOL 196 07/08/2014   TRIG 183* 04/12/2015 1500   HDL 52 04/12/2015 1500   HDL 54 07/08/2014   LDLCALC 101* 04/12/2015 1500   LDLCALC 108 07/08/2014    She reports excellent compliance with treatment. She is having side effects. Myalgias, worsening. Weight trend: stable Current diet: well balanced Current exercise: none due to knee pain.  Wt Readings from Last 3 Encounters:  09/28/15 207 lb (93.895 kg)  04/12/15 213 lb (96.616 kg)  01/14/15 206 lb (93.441 kg)    -------------------------------------------------------------------  Follow up for Knee Pain  The patient was last seen for this 4 months ago (see pt message). Changes made at last visit include referring to ortho.  She reports excellent compliance with treatment. Saw ortho on 08/25/2015. Pt has total right knee arthroscopy scheduled on 11/03/2015, and will be performed by Dr. Marry Guan. She feels that condition is Unchanged.  ------------------------------------------------------------------------------------     Allergies  Allergen Reactions  . Penicillin V   . Penicillin V Potassium Hives  . Sulfa Antibiotics Hives   Current Meds  Medication Sig  . aspirin 81 MG tablet Take by mouth.  Marland Kitchen CALCIUM-VITAMIN D PO Take by mouth.  . Cholecalciferol 1000 UNITS capsule Take by mouth.  . fenofibrate (TRICOR) 145 MG tablet Take 1 tablet (145 mg total) by mouth daily.  . MULTIPLE VITAMIN PO Take by mouth.  . OMEGA-3  FATTY ACIDS PO Take by mouth.  . rosuvastatin (CRESTOR) 20 MG tablet Take 1 tablet (20 mg total) by mouth daily.    Review of Systems  Constitutional: Positive for activity change (due to knee pain). Negative for fever and unexpected weight change.  Respiratory: Negative for cough, shortness of breath and wheezing.   Cardiovascular: Negative for chest pain, palpitations and leg swelling.  Musculoskeletal: Positive for myalgias and arthralgias.    Social History  Substance Use Topics  . Smoking status: Never Smoker   . Smokeless tobacco: Never Used  . Alcohol Use: No   Objective:   BP 110/78 mmHg  Pulse 84  Temp(Src) 97.5 F (36.4 C) (Oral)  Resp 16  Wt 207 lb (93.895 kg)  Physical Exam  Constitutional: She appears well-developed and well-nourished.  Cardiovascular: Normal rate, regular rhythm and normal heart sounds.   Pulmonary/Chest: Effort normal and breath sounds normal.  Musculoskeletal: She exhibits edema (trace).  Psychiatric: She has a normal mood and affect. Her behavior is normal.        Assessment & Plan:     1. Right knee pain Continue with TKR as scheduled. - ANA  2. Myalgia Check labs as below. - CK - Sedimentation rate  3. Hypercholesterolemia Continue medications for now, may D/C Crestor pending lab results (due to myalgias). - Comprehensive metabolic panel  4. Osteoarthritis, unspecified osteoarthritis type, unspecified site Worsening. FU with ortho as scheduled.      Patient seen and examined by Delfino Lovett  Rosanna Randy, MD, and note scribed by Renaldo Fiddler, CMA.   Richard Cranford Mon, MD  [facility]

## 2015-09-29 LAB — COMPREHENSIVE METABOLIC PANEL
ALK PHOS: 64 IU/L (ref 39–117)
ALT: 19 IU/L (ref 0–32)
AST: 19 IU/L (ref 0–40)
Albumin/Globulin Ratio: 1.6 (ref 1.2–2.2)
Albumin: 3.9 g/dL (ref 3.5–4.8)
BILIRUBIN TOTAL: 0.4 mg/dL (ref 0.0–1.2)
BUN / CREAT RATIO: 28 (ref 12–28)
BUN: 16 mg/dL (ref 8–27)
CHLORIDE: 102 mmol/L (ref 96–106)
CO2: 20 mmol/L (ref 18–29)
Calcium: 9.7 mg/dL (ref 8.7–10.3)
Creatinine, Ser: 0.57 mg/dL (ref 0.57–1.00)
GFR calc Af Amer: 103 mL/min/{1.73_m2} (ref >59)
GFR calc non Af Amer: 89 mL/min/{1.73_m2} (ref >59)
GLUCOSE: 123 mg/dL — AB (ref 65–99)
Globulin, Total: 2.5 g/dL (ref 1.5–4.5)
Potassium: 4.2 mmol/L (ref 3.5–5.2)
SODIUM: 141 mmol/L (ref 134–144)
Total Protein: 6.4 g/dL (ref 6.0–8.5)

## 2015-09-29 LAB — ANA: Anti Nuclear Antibody(ANA): NEGATIVE

## 2015-09-29 LAB — SEDIMENTATION RATE: SED RATE: 11 mm/h (ref 0–40)

## 2015-09-29 LAB — CK: CK TOTAL: 95 U/L (ref 24–173)

## 2015-10-05 ENCOUNTER — Ambulatory Visit: Payer: Medicare Other | Admitting: Family Medicine

## 2015-10-12 ENCOUNTER — Telehealth: Payer: Self-pay | Admitting: Family Medicine

## 2015-10-12 DIAGNOSIS — Z780 Asymptomatic menopausal state: Secondary | ICD-10-CM

## 2015-10-12 DIAGNOSIS — M8580 Other specified disorders of bone density and structure, unspecified site: Secondary | ICD-10-CM

## 2015-10-12 DIAGNOSIS — M25561 Pain in right knee: Secondary | ICD-10-CM

## 2015-10-12 DIAGNOSIS — M1990 Unspecified osteoarthritis, unspecified site: Secondary | ICD-10-CM

## 2015-10-12 NOTE — Telephone Encounter (Signed)
Please review-aa 

## 2015-10-12 NOTE — Telephone Encounter (Signed)
Order put in-aa 

## 2015-10-12 NOTE — Telephone Encounter (Signed)
Pt stated that she had her last bone density test done June 2015 and would like to go ahead and get a referral to have it done again. Pt stated that she would like to get it asap because she would like to have it schedule for this Thursday. Please advise. Thanks TNP

## 2015-10-12 NOTE — Telephone Encounter (Signed)
Ok to order but I am not sure about appt in 2 days?

## 2015-10-14 ENCOUNTER — Telehealth: Payer: Self-pay | Admitting: Family Medicine

## 2015-10-14 NOTE — Telephone Encounter (Signed)
Pt called wanting up to fax an order to [facility] for supplies.  She wants to go today.  She needs 4 bras and prothesis.  Pt states that we usually do this for her.  Pt's call back is [phone]  Thanks Con Memos

## 2015-10-14 NOTE — Telephone Encounter (Signed)
Please review-aa 

## 2015-10-16 NOTE — Telephone Encounter (Signed)
ok 

## 2015-10-18 NOTE — Telephone Encounter (Signed)
Order waiting to be signed.

## 2015-10-19 NOTE — Telephone Encounter (Signed)
Pt informed. Orders faxed.

## 2015-10-20 ENCOUNTER — Encounter
Admission: RE | Admit: 2015-10-20 | Discharge: 2015-10-20 | Disposition: A | Discharge status: Home / Self Care | Payer: Medicare Other | Source: Ambulatory Visit | Attending: Orthopedic Surgery | Admitting: Orthopedic Surgery

## 2015-10-20 ENCOUNTER — Telehealth: Payer: Self-pay | Admitting: Family Medicine

## 2015-10-20 ENCOUNTER — Inpatient Hospital Stay: Admission: RE | Admit: 2015-10-20 | Payer: Self-pay | Source: Ambulatory Visit

## 2015-10-20 DIAGNOSIS — Z01810 Encounter for preprocedural cardiovascular examination: Secondary | ICD-10-CM | POA: Insufficient documentation

## 2015-10-20 DIAGNOSIS — Z853 Personal history of malignant neoplasm of breast: Secondary | ICD-10-CM | POA: Insufficient documentation

## 2015-10-20 DIAGNOSIS — M1990 Unspecified osteoarthritis, unspecified site: Secondary | ICD-10-CM | POA: Diagnosis not present

## 2015-10-20 DIAGNOSIS — K219 Gastro-esophageal reflux disease without esophagitis: Secondary | ICD-10-CM | POA: Insufficient documentation

## 2015-10-20 DIAGNOSIS — E781 Pure hyperglyceridemia: Secondary | ICD-10-CM | POA: Insufficient documentation

## 2015-10-20 DIAGNOSIS — I10 Essential (primary) hypertension: Secondary | ICD-10-CM | POA: Diagnosis not present

## 2015-10-20 DIAGNOSIS — Z780 Asymptomatic menopausal state: Secondary | ICD-10-CM

## 2015-10-20 DIAGNOSIS — Z01812 Encounter for preprocedural laboratory examination: Secondary | ICD-10-CM | POA: Diagnosis not present

## 2015-10-20 HISTORY — DX: Gastro-esophageal reflux disease without esophagitis: K21.9

## 2015-10-20 HISTORY — DX: Unspecified osteoarthritis, unspecified site: M19.90

## 2015-10-20 HISTORY — DX: Mixed hyperlipidemia: E78.2

## 2015-10-20 HISTORY — DX: Pure hypercholesterolemia, unspecified: E78.00

## 2015-10-20 HISTORY — DX: Malignant (primary) neoplasm, unspecified: C80.1

## 2015-10-20 LAB — CBC
HCT: 41.6 % (ref 35.0–47.0)
Hemoglobin: 14.3 g/dL (ref 12.0–16.0)
MCH: 31 pg (ref 26.0–34.0)
MCHC: 34.4 g/dL (ref 32.0–36.0)
MCV: 89.9 fL (ref 80.0–100.0)
PLATELETS: 318 10*3/uL (ref 150–440)
RBC: 4.62 MIL/uL (ref 3.80–5.20)
RDW: 14.4 % (ref 11.5–14.5)
WBC: 10.7 10*3/uL (ref 3.6–11.0)

## 2015-10-20 LAB — COMPREHENSIVE METABOLIC PANEL
ALBUMIN: 3.9 g/dL (ref 3.5–5.0)
ALT: 20 U/L (ref 14–54)
AST: 23 U/L (ref 15–41)
Alkaline Phosphatase: 65 U/L (ref 38–126)
Anion gap: 6 (ref 5–15)
BUN: 18 mg/dL (ref 6–20)
CHLORIDE: 108 mmol/L (ref 101–111)
CO2: 29 mmol/L (ref 22–32)
Calcium: 9.4 mg/dL (ref 8.9–10.3)
Creatinine, Ser: 0.74 mg/dL (ref 0.44–1.00)
GFR calc Af Amer: >60 mL/min (ref >60)
GFR calc non Af Amer: >60 mL/min (ref >60)
Glucose, Bld: 66 mg/dL (ref 65–99)
POTASSIUM: 2.9 mmol/L — AB (ref 3.5–5.1)
SODIUM: 143 mmol/L (ref 135–145)
TOTAL PROTEIN: 7.5 g/dL (ref 6.5–8.1)
Total Bilirubin: 0.4 mg/dL (ref 0.3–1.2)

## 2015-10-20 LAB — URINALYSIS COMPLETE WITH MICROSCOPIC (ARMC ONLY)
Bilirubin Urine: NEGATIVE
GLUCOSE, UA: NEGATIVE mg/dL
Hgb urine dipstick: NEGATIVE
KETONES UR: NEGATIVE mg/dL
Nitrite: NEGATIVE
PROTEIN: NEGATIVE mg/dL
Specific Gravity, Urine: 1.017 (ref 1.005–1.030)
pH: 6 (ref 5.0–8.0)

## 2015-10-20 LAB — TYPE AND SCREEN
ABO/RH(D): O POS
ANTIBODY SCREEN: NEGATIVE

## 2015-10-20 LAB — APTT: APTT: 31 s (ref 24–36)

## 2015-10-20 LAB — SURGICAL PCR SCREEN
MRSA, PCR: NEGATIVE
Staphylococcus aureus: NEGATIVE

## 2015-10-20 LAB — PROTIME-INR
INR: 1
Prothrombin Time: 13.2 seconds (ref 11.4–15.2)

## 2015-10-20 LAB — SEDIMENTATION RATE: SED RATE: 12 mm/h (ref 0–30)

## 2015-10-20 NOTE — Telephone Encounter (Signed)
Called patient to verify which facility she would like to have bone density. At [facility] or [facility]? Left message to call back.

## 2015-10-20 NOTE — Telephone Encounter (Signed)
Okay to order, postmenopausal would be diagnoses. Sometimes it is not covered.

## 2015-10-20 NOTE — Telephone Encounter (Signed)
Pt called saying she would like to get a bone density test. ASAP.  Pt call back is [phone]  Thanks, Con Memos

## 2015-10-20 NOTE — Telephone Encounter (Signed)
Ok to order. Patient's last BMD was 09/08/2013 at [facility] and it was normal.

## 2015-10-20 NOTE — Patient Instructions (Addendum)
  Your procedure is scheduled on:Wednesday August 23 , 2017. Report to Same Day Surgery. To find out your arrival time please call [phone] between 1PM - 3PM on Tuesday November 02, 2015.  Remember: Instructions that are not followed completely may result in serious medical risk, up to and including death, or upon the discretion of your surgeon and anesthesiologist your surgery may need to be rescheduled.    _x___ 1. Do not eat food or drink liquids after midnight. No gum chewing or hard candies.     ____ 2. No Alcohol for 24 hours before or after surgery.   ____ 3. Bring all medications with you on the day of surgery if instructed.    __x__ 4. Notify your doctor if there is any change in your medical condition     (cold, fever, infections).     Do not wear jewelry, make-up, hairpins, clips or nail polish.  Do not wear lotions, powders, or perfumes. You may wear deodorant.  Do not shave 48 hours prior to surgery. Men may shave face and neck.  Do not bring valuables to the hospital.    [facility] is not responsible for any belongings or valuables.               Contacts, dentures or bridgework may not be worn into surgery.  Leave your suitcase in the car. After surgery it may be brought to your room.  For patients admitted to the hospital, discharge time is determined by your treatment team.   Patients discharged the day of surgery will not be allowed to drive home.    Please read over the following fact sheets that you were given:   [facility] Preparing for Surgery  ____ Take these medicines the morning of surgery with A SIP OF WATER: NONE    ____ Fleet Enema (as directed)   __x__ Use CHG Soap as directed on instruction sheet  ____ Use inhalers on the day of surgery and bring to hospital day of surgery  ____ Stop metformin 2 days prior to surgery    ____ Take 1/2 of usual insulin dose the night before surgery and none on the morning of  surgery.   __x__ Stop aspirin 7  days prior to surgery  _x___ Stop Anti-inflammatories such as Advil, Aleve, Ibuprofen, Motrin, Naproxen,  Naprosyn, Goodies powders or aspirin products. OK to take Tylenol.   __x__ Stop supplements all vitamins and fish oil 7 days prior to  surgery.    ____ Bring C-Pap to the hospital.

## 2015-10-21 LAB — URINE CULTURE: SPECIAL REQUESTS: NORMAL

## 2015-10-21 NOTE — Telephone Encounter (Signed)
Appointment scheduled at [place] 10/28/15 at 1:00.Pt advised

## 2015-10-21 NOTE — Telephone Encounter (Signed)
She would like to make her own appt.  She just needs the order.  She said she goes across the street from [facility].  Thanks Con Memos

## 2015-10-21 NOTE — Telephone Encounter (Signed)
Per patient, she would like [place] to schedule the appt. She reports that early afternoon works best for her. She would like to get this done ASAP. Please call patient when scheduled. Thanks!

## 2015-10-25 ENCOUNTER — Telehealth: Payer: Self-pay | Admitting: Family Medicine

## 2015-10-25 MED ORDER — POTASSIUM CHLORIDE CRYS ER 20 MEQ PO TBCR: 20.0000 meq | EXTENDED_RELEASE_TABLET | Freq: Every day | ORAL | 0 refills | Status: DC

## 2015-10-25 NOTE — Pre-Procedure Instructions (Signed)
SPOKE WITH TIFFANY AT DR [facility] AND ASKED THAT KT SUPP DEFINITELY BE DONE AND RECHECK KT BY END OF WEEK TO REASSESS. KT ORDERED FOR AM SURGERY.

## 2015-10-25 NOTE — Telephone Encounter (Signed)
Please advise patient that her pre-op labs showed low potassium she needs to start potassium 57mEq daily and they will need to recheck her level the day of surgery. Have sent rx to [facility] in [place].

## 2015-10-26 NOTE — Telephone Encounter (Signed)
Pt advised.   Thanks,   -Susy Placzek  

## 2015-10-26 NOTE — Telephone Encounter (Signed)
Pt called back this am.  She said she needs to get started on the potassium before hre surgery next week.  She uses [facility] [place].  Her call back is [phone]  [facility]

## 2015-11-01 ENCOUNTER — Encounter
Admission: RE | Admit: 2015-11-01 | Discharge: 2015-11-01 | Disposition: A | Discharge status: Home / Self Care | Payer: Medicare Other | Source: Ambulatory Visit | Attending: Orthopedic Surgery | Admitting: Orthopedic Surgery

## 2015-11-01 LAB — POTASSIUM: Potassium: 4 mmol/L (ref 3.5–5.1)

## 2015-11-01 NOTE — Pre-Procedure Instructions (Signed)
KT REPEATED 11/01/15 AND 4.0. SPOKE WITH DR Amie Critchley AND TO TAKE KT SUPPLEMENT 11/02/15. WILL STILL RECHECK AM SURGERY. PATIENT NOTIFIED AND INSTRUCTED. NOTIFIED LM FOR DR [facility]

## 2015-11-03 ENCOUNTER — Encounter
Admission: RE | Disposition: A | Discharge status: Skilled Nursing Facility | Payer: Self-pay | Source: Ambulatory Visit | Attending: Orthopedic Surgery

## 2015-11-03 ENCOUNTER — Inpatient Hospital Stay: Payer: Medicare Other | Admitting: Anesthesiology

## 2015-11-03 ENCOUNTER — Inpatient Hospital Stay
Admission: RE | Admit: 2015-11-03 | Discharge: 2015-11-06 | DRG: 470 | Disposition: A | Discharge status: Skilled Nursing Facility | Payer: Medicare Other | Source: Ambulatory Visit | Attending: Orthopedic Surgery | Admitting: Orthopedic Surgery

## 2015-11-03 ENCOUNTER — Encounter: Payer: Self-pay | Admitting: Orthopedic Surgery

## 2015-11-03 ENCOUNTER — Inpatient Hospital Stay: Payer: Medicare Other

## 2015-11-03 DIAGNOSIS — Z7401 Bed confinement status: Secondary | ICD-10-CM | POA: Diagnosis not present

## 2015-11-03 DIAGNOSIS — D62 Acute posthemorrhagic anemia: Secondary | ICD-10-CM | POA: Diagnosis not present

## 2015-11-03 DIAGNOSIS — Z7982 Long term (current) use of aspirin: Secondary | ICD-10-CM

## 2015-11-03 DIAGNOSIS — F419 Anxiety disorder, unspecified: Secondary | ICD-10-CM | POA: Diagnosis present

## 2015-11-03 DIAGNOSIS — M179 Osteoarthritis of knee, unspecified: Secondary | ICD-10-CM | POA: Diagnosis not present

## 2015-11-03 DIAGNOSIS — Z79899 Other long term (current) drug therapy: Secondary | ICD-10-CM | POA: Diagnosis not present

## 2015-11-03 DIAGNOSIS — Z880 Allergy status to penicillin: Secondary | ICD-10-CM

## 2015-11-03 DIAGNOSIS — M1711 Unilateral primary osteoarthritis, right knee: Secondary | ICD-10-CM | POA: Diagnosis not present

## 2015-11-03 DIAGNOSIS — Z961 Presence of intraocular lens: Secondary | ICD-10-CM | POA: Diagnosis present

## 2015-11-03 DIAGNOSIS — M25561 Pain in right knee: Secondary | ICD-10-CM | POA: Diagnosis present

## 2015-11-03 DIAGNOSIS — Z9842 Cataract extraction status, left eye: Secondary | ICD-10-CM | POA: Diagnosis not present

## 2015-11-03 DIAGNOSIS — E559 Vitamin D deficiency, unspecified: Secondary | ICD-10-CM | POA: Diagnosis not present

## 2015-11-03 DIAGNOSIS — N390 Urinary tract infection, site not specified: Secondary | ICD-10-CM | POA: Diagnosis not present

## 2015-11-03 DIAGNOSIS — Z8551 Personal history of malignant neoplasm of bladder: Secondary | ICD-10-CM | POA: Diagnosis not present

## 2015-11-03 DIAGNOSIS — K219 Gastro-esophageal reflux disease without esophagitis: Secondary | ICD-10-CM | POA: Diagnosis present

## 2015-11-03 DIAGNOSIS — R262 Difficulty in walking, not elsewhere classified: Secondary | ICD-10-CM | POA: Diagnosis not present

## 2015-11-03 DIAGNOSIS — E7800 Pure hypercholesterolemia, unspecified: Secondary | ICD-10-CM | POA: Diagnosis present

## 2015-11-03 DIAGNOSIS — Z96651 Presence of right artificial knee joint: Secondary | ICD-10-CM | POA: Diagnosis not present

## 2015-11-03 DIAGNOSIS — Z96659 Presence of unspecified artificial knee joint: Secondary | ICD-10-CM

## 2015-11-03 DIAGNOSIS — Z882 Allergy status to sulfonamides status: Secondary | ICD-10-CM | POA: Diagnosis not present

## 2015-11-03 DIAGNOSIS — M6281 Muscle weakness (generalized): Secondary | ICD-10-CM | POA: Diagnosis not present

## 2015-11-03 DIAGNOSIS — Z471 Aftercare following joint replacement surgery: Secondary | ICD-10-CM | POA: Diagnosis not present

## 2015-11-03 HISTORY — PX: KNEE ARTHROPLASTY: SHX992

## 2015-11-03 LAB — CREATININE, SERUM
CREATININE: 0.56 mg/dL (ref 0.44–1.00)
GFR calc Af Amer: >60 mL/min (ref >60)

## 2015-11-03 LAB — POCT I-STAT 4, (NA,K, GLUC, HGB,HCT)
Glucose, Bld: 114 mg/dL — ABNORMAL HIGH (ref 65–99)
HCT: 41 % (ref 36.0–46.0)
Hemoglobin: 13.9 g/dL (ref 12.0–15.0)
Potassium: 3.6 mmol/L (ref 3.5–5.1)
SODIUM: 144 mmol/L (ref 135–145)

## 2015-11-03 SURGERY — ARTHROPLASTY, KNEE, TOTAL, USING IMAGELESS COMPUTER-ASSISTED NAVIGATION
Anesthesia: Spinal | Site: Knee | Laterality: Right | Wound class: Clean

## 2015-11-03 MED ORDER — SODIUM CHLORIDE FLUSH 0.9 % IV SOLN
INTRAVENOUS | Status: AC
  Administered 2015-11-03: 11:00:00
  Filled 2015-11-03: qty 10

## 2015-11-03 MED ORDER — OXYCODONE HCL 5 MG PO TABS
5.0000 mg | ORAL_TABLET | Freq: Once | ORAL | Status: AC | PRN
  Administered 2015-11-03: 5 mg via ORAL

## 2015-11-03 MED ORDER — PHENYLEPHRINE HCL 10 MG/ML IJ SOLN
INTRAMUSCULAR | Status: DC | PRN
  Administered 2015-11-03 (×3): 100 ug via INTRAVENOUS

## 2015-11-03 MED ORDER — GLYCOPYRROLATE 0.2 MG/ML IJ SOLN
INTRAMUSCULAR | Status: DC | PRN
  Administered 2015-11-03: 0.2 mg via INTRAVENOUS

## 2015-11-03 MED ORDER — TRANEXAMIC ACID 1000 MG/10ML IV SOLN
1000.0000 mg | Freq: Once | INTRAVENOUS | Status: DC
  Administered 2015-11-03: 1000 mg via INTRAVENOUS
  Filled 2015-11-03: qty 10

## 2015-11-03 MED ORDER — ACETAMINOPHEN 10 MG/ML IV SOLN
INTRAVENOUS | Status: AC
  Filled 2015-11-03: qty 100

## 2015-11-03 MED ORDER — FENOFIBRATE 160 MG PO TABS
160.0000 mg | ORAL_TABLET | Freq: Every day | ORAL | Status: DC
  Administered 2015-11-04 – 2015-11-06 (×3): 160 mg via ORAL
  Filled 2015-11-03 (×3): qty 1

## 2015-11-03 MED ORDER — BUPIVACAINE HCL (PF) 0.25 % IJ SOLN
INTRAMUSCULAR | Status: AC
  Filled 2015-11-03: qty 60

## 2015-11-03 MED ORDER — BUPIVACAINE HCL (PF) 0.5 % IJ SOLN
INTRAMUSCULAR | Status: DC | PRN
  Administered 2015-11-03: 2 mL

## 2015-11-03 MED ORDER — OXYCODONE HCL 5 MG PO TABS
ORAL_TABLET | ORAL | Status: AC
  Administered 2015-11-04: 10 mg via ORAL
  Filled 2015-11-03: qty 1

## 2015-11-03 MED ORDER — ACETAMINOPHEN 10 MG/ML IV SOLN
1000.0000 mg | Freq: Four times a day (QID) | INTRAVENOUS | Status: AC
  Administered 2015-11-03 – 2015-11-04 (×4): 1000 mg via INTRAVENOUS
  Filled 2015-11-03 (×4): qty 100

## 2015-11-03 MED ORDER — ADULT MULTIVITAMIN W/MINERALS CH
1.0000 | ORAL_TABLET | Freq: Every day | ORAL | Status: DC
  Administered 2015-11-04 – 2015-11-06 (×3): 1 via ORAL
  Filled 2015-11-03 (×4): qty 1

## 2015-11-03 MED ORDER — PHENOL 1.4 % MT LIQD
1.0000 | OROMUCOSAL | Status: DC | PRN
Start: 2015-11-03 — End: 2015-11-06
  Filled 2015-11-03: qty 177

## 2015-11-03 MED ORDER — FENTANYL CITRATE (PF) 100 MCG/2ML IJ SOLN
INTRAMUSCULAR | Status: AC
  Administered 2015-11-03: 25 ug
  Filled 2015-11-03: qty 2

## 2015-11-03 MED ORDER — CALCIUM CARBONATE-VITAMIN D 500-200 MG-UNIT PO TABS
1.0000 | ORAL_TABLET | Freq: Every day | ORAL | Status: DC
  Administered 2015-11-04 – 2015-11-06 (×3): 1 via ORAL
  Filled 2015-11-03 (×3): qty 1

## 2015-11-03 MED ORDER — SODIUM CHLORIDE 0.9 % IJ SOLN
INTRAMUSCULAR | Status: AC
  Filled 2015-11-03: qty 50

## 2015-11-03 MED ORDER — MORPHINE SULFATE (PF) 2 MG/ML IV SOLN
2.0000 mg | INTRAVENOUS | Status: DC | PRN
Start: 2015-11-03 — End: 2015-11-06
  Administered 2015-11-03 – 2015-11-04 (×3): 2 mg via INTRAVENOUS
  Filled 2015-11-03 (×3): qty 1

## 2015-11-03 MED ORDER — FENTANYL CITRATE (PF) 100 MCG/2ML IJ SOLN
INTRAMUSCULAR | Status: DC | PRN
  Administered 2015-11-03 (×4): 25 ug via INTRAVENOUS

## 2015-11-03 MED ORDER — NEOMYCIN-POLYMYXIN B GU 40-200000 IR SOLN
Status: AC
  Filled 2015-11-03: qty 20

## 2015-11-03 MED ORDER — ONDANSETRON HCL 4 MG/2ML IJ SOLN: 4.0000 mg | Freq: Four times a day (QID) | INTRAMUSCULAR | Status: DC | PRN

## 2015-11-03 MED ORDER — ENOXAPARIN SODIUM 30 MG/0.3ML ~~LOC~~ SOLN
30.0000 mg | Freq: Two times a day (BID) | SUBCUTANEOUS | Status: DC
Start: 2015-11-04 — End: 2015-11-06
  Administered 2015-11-04 – 2015-11-06 (×5): 30 mg via SUBCUTANEOUS
  Filled 2015-11-03 (×5): qty 0.3

## 2015-11-03 MED ORDER — OXYCODONE HCL 5 MG/5ML PO SOLN: 5.0000 mg | Freq: Once | ORAL | Status: DC | PRN

## 2015-11-03 MED ORDER — PANTOPRAZOLE SODIUM 40 MG PO TBEC
40.0000 mg | DELAYED_RELEASE_TABLET | Freq: Two times a day (BID) | ORAL | Status: DC
  Administered 2015-11-03 – 2015-11-06 (×6): 40 mg via ORAL
  Filled 2015-11-03 (×6): qty 1

## 2015-11-03 MED ORDER — FAMOTIDINE 20 MG PO TABS
ORAL_TABLET | ORAL | Status: AC
  Administered 2015-11-03: 20 mg via ORAL
  Filled 2015-11-03: qty 1

## 2015-11-03 MED ORDER — ALUM & MAG HYDROXIDE-SIMETH 200-200-20 MG/5ML PO SUSP: 30.0000 mL | ORAL | Status: DC | PRN

## 2015-11-03 MED ORDER — FLEET ENEMA 7-19 GM/118ML RE ENEM: 1.0000 | ENEMA | Freq: Once | RECTAL | Status: DC | PRN

## 2015-11-03 MED ORDER — VITAMIN D3 25 MCG (1000 UNIT) PO TABS
2000.0000 [IU] | ORAL_TABLET | Freq: Every day | ORAL | Status: DC
  Administered 2015-11-03 – 2015-11-05 (×3): 2000 [IU] via ORAL
  Filled 2015-11-03 (×8): qty 2

## 2015-11-03 MED ORDER — SENNOSIDES-DOCUSATE SODIUM 8.6-50 MG PO TABS
1.0000 | ORAL_TABLET | Freq: Two times a day (BID) | ORAL | Status: DC
  Administered 2015-11-03 – 2015-11-06 (×6): 1 via ORAL
  Filled 2015-11-03 (×6): qty 1

## 2015-11-03 MED ORDER — CLINDAMYCIN PHOSPHATE 900 MG/50ML IV SOLN
INTRAVENOUS | Status: DC | PRN
  Administered 2015-11-03: 900 mg via INTRAVENOUS

## 2015-11-03 MED ORDER — FENTANYL CITRATE (PF) 100 MCG/2ML IJ SOLN: 25.0000 ug | INTRAMUSCULAR | Status: DC | PRN

## 2015-11-03 MED ORDER — FAMOTIDINE 20 MG PO TABS
20.0000 mg | ORAL_TABLET | Freq: Once | ORAL | Status: AC
  Administered 2015-11-03: 20 mg via ORAL

## 2015-11-03 MED ORDER — TETRACAINE HCL 1 % IJ SOLN
INTRAMUSCULAR | Status: DC | PRN
  Administered 2015-11-03: 10 mg via INTRASPINAL

## 2015-11-03 MED ORDER — ONDANSETRON HCL 4 MG PO TABS
4.0000 mg | ORAL_TABLET | Freq: Four times a day (QID) | ORAL | Status: DC | PRN
Start: 2015-11-03 — End: 2015-11-06

## 2015-11-03 MED ORDER — PROPOFOL 500 MG/50ML IV EMUL
INTRAVENOUS | Status: DC | PRN
  Administered 2015-11-03: 50 ug/kg/min via INTRAVENOUS

## 2015-11-03 MED ORDER — SODIUM CHLORIDE 0.9 % IV SOLN
1000.0000 mg | INTRAVENOUS | Status: DC
  Filled 2015-11-03: qty 10

## 2015-11-03 MED ORDER — FENTANYL CITRATE (PF) 100 MCG/2ML IJ SOLN
25.0000 ug | INTRAMUSCULAR | Status: DC | PRN
  Administered 2015-11-03: 25 ug via INTRAVENOUS
  Administered 2015-11-03: 50 ug via INTRAVENOUS
  Administered 2015-11-03 (×3): 25 ug via INTRAVENOUS

## 2015-11-03 MED ORDER — CLINDAMYCIN PHOSPHATE 900 MG/50ML IV SOLN
INTRAVENOUS | Status: AC
  Filled 2015-11-03: qty 50

## 2015-11-03 MED ORDER — BUPIVACAINE LIPOSOME 1.3 % IJ SUSP
INTRAMUSCULAR | Status: AC
  Filled 2015-11-03: qty 20

## 2015-11-03 MED ORDER — ONDANSETRON HCL 4 MG/2ML IJ SOLN
INTRAMUSCULAR | Status: DC | PRN
  Administered 2015-11-03: 4 mg via INTRAVENOUS

## 2015-11-03 MED ORDER — CALCIUM-VITAMIN D 600-400 MG-UNIT PO TABS: 1.0000 | ORAL_TABLET | Freq: Every day | ORAL | Status: DC

## 2015-11-03 MED ORDER — ACETAMINOPHEN 10 MG/ML IV SOLN
INTRAVENOUS | Status: DC | PRN
  Administered 2015-11-03: 1000 mg via INTRAVENOUS

## 2015-11-03 MED ORDER — FERROUS SULFATE 325 (65 FE) MG PO TABS
325.0000 mg | ORAL_TABLET | Freq: Two times a day (BID) | ORAL | Status: DC
  Administered 2015-11-04 – 2015-11-06 (×4): 325 mg via ORAL
  Filled 2015-11-03 (×4): qty 1

## 2015-11-03 MED ORDER — BUPIVACAINE HCL (PF) 0.25 % IJ SOLN
INTRAMUSCULAR | Status: DC | PRN
  Administered 2015-11-03: 60 mL

## 2015-11-03 MED ORDER — TRANEXAMIC ACID 1000 MG/10ML IV SOLN
INTRAVENOUS | Status: DC | PRN
  Administered 2015-11-03: 1000 mg via INTRAVENOUS

## 2015-11-03 MED ORDER — CLINDAMYCIN PHOSPHATE 900 MG/50ML IV SOLN: 900.0000 mg | INTRAVENOUS | Status: DC

## 2015-11-03 MED ORDER — OMEGA-3-ACID ETHYL ESTERS 1 G PO CAPS
1.0000 g | ORAL_CAPSULE | Freq: Every day | ORAL | Status: DC
  Administered 2015-11-04 – 2015-11-06 (×3): 1 g via ORAL
  Filled 2015-11-03 (×3): qty 1

## 2015-11-03 MED ORDER — CLINDAMYCIN PHOSPHATE 600 MG/50ML IV SOLN
600.0000 mg | Freq: Four times a day (QID) | INTRAVENOUS | Status: AC
  Administered 2015-11-03 – 2015-11-04 (×4): 600 mg via INTRAVENOUS
  Filled 2015-11-03 (×4): qty 50

## 2015-11-03 MED ORDER — DIPHENHYDRAMINE HCL 12.5 MG/5ML PO ELIX: 12.5000 mg | ORAL_SOLUTION | ORAL | Status: DC | PRN

## 2015-11-03 MED ORDER — TRAMADOL HCL 50 MG PO TABS
50.0000 mg | ORAL_TABLET | ORAL | Status: DC | PRN
  Administered 2015-11-03: 50 mg via ORAL
  Administered 2015-11-04 – 2015-11-06 (×6): 100 mg via ORAL
  Filled 2015-11-03: qty 2
  Filled 2015-11-03: qty 1
  Filled 2015-11-03 (×2): qty 2
  Filled 2015-11-03: qty 1
  Filled 2015-11-03 (×3): qty 2

## 2015-11-03 MED ORDER — METOCLOPRAMIDE HCL 10 MG PO TABS
10.0000 mg | ORAL_TABLET | Freq: Three times a day (TID) | ORAL | Status: AC
  Administered 2015-11-03 – 2015-11-05 (×6): 10 mg via ORAL
  Filled 2015-11-03 (×6): qty 1

## 2015-11-03 MED ORDER — LACTATED RINGERS IV SOLN
INTRAVENOUS | Status: DC
  Administered 2015-11-03 (×2): via INTRAVENOUS

## 2015-11-03 MED ORDER — MENTHOL 3 MG MT LOZG
1.0000 | LOZENGE | OROMUCOSAL | Status: DC | PRN
Start: 2015-11-03 — End: 2015-11-06
  Filled 2015-11-03: qty 9

## 2015-11-03 MED ORDER — ACETAMINOPHEN 325 MG PO TABS
650.0000 mg | ORAL_TABLET | Freq: Four times a day (QID) | ORAL | Status: DC | PRN
  Administered 2015-11-05: 650 mg via ORAL
  Filled 2015-11-03: qty 2

## 2015-11-03 MED ORDER — ACETAMINOPHEN 650 MG RE SUPP: 650.0000 mg | Freq: Four times a day (QID) | RECTAL | Status: DC | PRN

## 2015-11-03 MED ORDER — SODIUM CHLORIDE 0.9 % IV SOLN
INTRAVENOUS | Status: DC
  Administered 2015-11-03 – 2015-11-04 (×2): via INTRAVENOUS

## 2015-11-03 MED ORDER — FENTANYL CITRATE (PF) 100 MCG/2ML IJ SOLN
INTRAMUSCULAR | Status: AC
  Filled 2015-11-03: qty 2

## 2015-11-03 MED ORDER — OXYCODONE HCL 5 MG PO TABS: 5.0000 mg | ORAL_TABLET | Freq: Once | ORAL | Status: DC | PRN

## 2015-11-03 MED ORDER — NEOMYCIN-POLYMYXIN B GU 40-200000 IR SOLN
Status: DC | PRN
  Administered 2015-11-03: 14 mL

## 2015-11-03 MED ORDER — MIDAZOLAM HCL 5 MG/5ML IJ SOLN
INTRAMUSCULAR | Status: DC | PRN
  Administered 2015-11-03: 2 mg via INTRAVENOUS

## 2015-11-03 MED ORDER — MAGNESIUM HYDROXIDE 400 MG/5ML PO SUSP
30.0000 mL | Freq: Every day | ORAL | Status: DC | PRN
  Administered 2015-11-04: 30 mL via ORAL
  Filled 2015-11-03: qty 30

## 2015-11-03 MED ORDER — BISACODYL 10 MG RE SUPP
10.0000 mg | Freq: Every day | RECTAL | Status: DC | PRN
  Administered 2015-11-06: 10 mg via RECTAL
  Filled 2015-11-03: qty 1

## 2015-11-03 MED ORDER — SODIUM CHLORIDE 0.9 % IV SOLN
INTRAVENOUS | Status: DC | PRN
  Administered 2015-11-03: 60 mL

## 2015-11-03 MED ORDER — OXYCODONE HCL 5 MG PO TABS
5.0000 mg | ORAL_TABLET | ORAL | Status: DC | PRN
  Administered 2015-11-03 – 2015-11-04 (×5): 10 mg via ORAL
  Administered 2015-11-04: 5 mg via ORAL
  Administered 2015-11-04 – 2015-11-05 (×3): 10 mg via ORAL
  Administered 2015-11-05 (×2): 5 mg via ORAL
  Administered 2015-11-06: 10 mg via ORAL
  Filled 2015-11-03: qty 1
  Filled 2015-11-03 (×2): qty 2
  Filled 2015-11-03: qty 1
  Filled 2015-11-03 (×6): qty 2
  Filled 2015-11-03: qty 1
  Filled 2015-11-03: qty 2

## 2015-11-03 MED ORDER — CHLORHEXIDINE GLUCONATE 4 % EX LIQD: 60.0000 mL | Freq: Once | CUTANEOUS | Status: DC

## 2015-11-03 SURGICAL SUPPLY — 61 items
AUTOTRANSFUS HAS 1/8 (MISCELLANEOUS) ×3
BATTERY INSTRU NAVIGATION (MISCELLANEOUS) ×12 IMPLANT
BLADE SAW 1 (BLADE) ×3 IMPLANT
BLADE SAW 1/2 (BLADE) ×3 IMPLANT
BTRY SRG DRVR LF (MISCELLANEOUS) ×4
CANISTER SUCT 1200ML W/VALVE (MISCELLANEOUS) ×3 IMPLANT
CANISTER SUCT 3000ML (MISCELLANEOUS) ×6 IMPLANT
CAPT KNEE TOTAL 3 ATTUNE ×2 IMPLANT
CATH TRAY METER 16FR LF (MISCELLANEOUS) ×3 IMPLANT
CEMENT HV SMART SET (Cement) ×6 IMPLANT
COOLER POLAR GLACIER W/PUMP (MISCELLANEOUS) ×3 IMPLANT
CUFF TOURN 24 STER (MISCELLANEOUS) IMPLANT
CUFF TOURN 30 STER DUAL PORT (MISCELLANEOUS) ×2 IMPLANT
DRAPE SHEET LG 3/4 BI-LAMINATE (DRAPES) ×3 IMPLANT
DRSG DERMACEA 8X12 NADH (GAUZE/BANDAGES/DRESSINGS) ×3 IMPLANT
DRSG OPSITE POSTOP 4X14 (GAUZE/BANDAGES/DRESSINGS) ×3 IMPLANT
DRSG TEGADERM 4X4.75 (GAUZE/BANDAGES/DRESSINGS) ×3 IMPLANT
DURAPREP 26ML APPLICATOR (WOUND CARE) ×6 IMPLANT
ELECT CAUTERY BLADE 6.4 (BLADE) ×3 IMPLANT
ELECT REM PT RETURN 9FT ADLT (ELECTROSURGICAL) ×3
ELECTRODE REM PT RTRN 9FT ADLT (ELECTROSURGICAL) ×1 IMPLANT
EX-PIN ORTHOLOCK NAV 4X150 (PIN) ×6 IMPLANT
GLOVE BIOGEL M STRL SZ7.5 (GLOVE) ×6 IMPLANT
GLOVE INDICATOR 8.0 STRL GRN (GLOVE) ×3 IMPLANT
GLOVE SURG 9.0 ORTHO LTXF (GLOVE) ×3 IMPLANT
GLOVE SURG ORTHO 9.0 STRL STRW (GLOVE) ×3 IMPLANT
GOWN STRL REUS W/ TWL LRG LVL3 (GOWN DISPOSABLE) ×2 IMPLANT
GOWN STRL REUS W/TWL 2XL LVL3 (GOWN DISPOSABLE) ×3 IMPLANT
GOWN STRL REUS W/TWL LRG LVL3 (GOWN DISPOSABLE) ×6
HANDPIECE INTERPULSE COAX TIP (DISPOSABLE) ×3
HOLDER FOLEY CATH W/STRAP (MISCELLANEOUS) ×3 IMPLANT
HOOD PEEL AWAY FLYTE STAYCOOL (MISCELLANEOUS) ×6 IMPLANT
KIT RM TURNOVER STRD PROC AR (KITS) ×3 IMPLANT
KNIFE SCULPS 14X20 (INSTRUMENTS) ×3 IMPLANT
LABEL OR SOLS (LABEL) ×3 IMPLANT
NDL SAFETY 18GX1.5 (NEEDLE) ×3 IMPLANT
NDL SPNL 20GX3.5 QUINCKE YW (NEEDLE) ×1 IMPLANT
NEEDLE SPNL 20GX3.5 QUINCKE YW (NEEDLE) ×3 IMPLANT
NS IRRIG 500ML POUR BTL (IV SOLUTION) ×3 IMPLANT
PACK TOTAL KNEE (MISCELLANEOUS) ×3 IMPLANT
PAD WRAPON POLAR KNEE (MISCELLANEOUS) ×1 IMPLANT
PIN FIXATION 1/8DIA X 3INL (PIN) ×3 IMPLANT
SET HNDPC FAN SPRY TIP SCT (DISPOSABLE) ×1 IMPLANT
SOL .9 NS 3000ML IRR  AL (IV SOLUTION) ×2
SOL .9 NS 3000ML IRR AL (IV SOLUTION) ×1
SOL .9 NS 3000ML IRR UROMATIC (IV SOLUTION) ×1 IMPLANT
SOL PREP PVP 2OZ (MISCELLANEOUS) ×3
SOLUTION PREP PVP 2OZ (MISCELLANEOUS) ×1 IMPLANT
SPONGE DRAIN TRACH 4X4 STRL 2S (GAUZE/BANDAGES/DRESSINGS) ×3 IMPLANT
STAPLER SKIN PROX 35W (STAPLE) ×3 IMPLANT
SUCTION FRAZIER HANDLE 10FR (MISCELLANEOUS) ×2
SUCTION TUBE FRAZIER 10FR DISP (MISCELLANEOUS) ×1 IMPLANT
SUT VIC AB 0 CT1 36 (SUTURE) ×3 IMPLANT
SUT VIC AB 1 CT1 36 (SUTURE) ×6 IMPLANT
SUT VIC AB 2-0 CT2 27 (SUTURE) ×3 IMPLANT
SYR 20CC LL (SYRINGE) ×3 IMPLANT
SYR 30ML LL (SYRINGE) ×6 IMPLANT
SYSTEM AUTOTRANSFUS DUAL TROCR (MISCELLANEOUS) ×1 IMPLANT
TOWEL OR 17X26 4PK STRL BLUE (TOWEL DISPOSABLE) ×3 IMPLANT
TOWER CARTRIDGE SMART MIX (DISPOSABLE) ×3 IMPLANT
WRAPON POLAR PAD KNEE (MISCELLANEOUS) ×3

## 2015-11-03 NOTE — Anesthesia Preprocedure Evaluation (Signed)
Anesthesia Evaluation  Patient identified by MRN, date of birth, ID band Patient awake    Reviewed: Allergy & Precautions, H&P , NPO status , Patient's Chart, lab work & pertinent test results  Airway Mallampati: III  TM Distance: <3 FB Neck ROM: limited    Dental  (+) Poor Dentition, Chipped   Pulmonary neg pulmonary ROS,    Pulmonary exam normal breath sounds clear to auscultation       Cardiovascular Exercise Tolerance: Good hypertension, (-) angina(-) Past MI and (-) DOE Normal cardiovascular exam Rhythm:regular Rate:Normal     Neuro/Psych PSYCHIATRIC DISORDERS Anxiety negative neurological ROS     GI/Hepatic Neg liver ROS, GERD  Controlled,  Endo/Other  negative endocrine ROS  Renal/GU negative Renal ROS  negative genitourinary   Musculoskeletal  (+) Arthritis ,   Abdominal   Peds  Hematology negative hematology ROS (+)   Anesthesia Other Findings Past Medical History: No date: Arthritis 1986: Cancer ([place])     Comment: left breast No date: Elevated triglycerides with high cholesterol No date: GERD (gastroesophageal reflux disease)     Comment: history of reflux No date: Hypercholesterolemia  Past Surgical History: [id number]: ABDOMINAL HYSTERECTOMY     Comment: TAH/BSO 07/1984: BREAST SURGERY Left     Comment: Mastectomy 2015: CATARACT EXTRACTION W/ INTRAOCULAR LENS IMPLANT Left 2013: EYE SURGERY Left 04/2013: FOOT SURGERY Left  BMI    Body Mass Index:  35.25 kg/m      Reproductive/Obstetrics negative OB ROS                             Anesthesia Physical Anesthesia Plan  ASA: III  Anesthesia Plan: Spinal   Post-op Pain Management:    Induction:   Airway Management Planned:   Additional Equipment:   Intra-op Plan:   Post-operative Plan:   Informed Consent: I have reviewed the patients History and Physical, chart, labs and discussed the procedure including  the risks, benefits and alternatives for the proposed anesthesia with the patient or authorized representative who has indicated his/her understanding and acceptance.   Dental Advisory Given  Plan Discussed with: Anesthesiologist, CRNA and Surgeon  Anesthesia Plan Comments:         Anesthesia Quick Evaluation

## 2015-11-03 NOTE — Anesthesia Procedure Notes (Signed)
Spinal  Patient location during procedure: OR Start time: 11/03/2015 12:00 PM End time: 11/03/2015 12:03 PM Staffing Anesthesiologist: Katy Fitch K Performed: anesthesiologist  Preanesthetic Checklist Completed: patient identified, site marked, surgical consent, pre-op evaluation, timeout performed, IV checked, risks and benefits discussed and monitors and equipment checked Spinal Block Patient position: sitting Prep: ChloraPrep Patient monitoring: heart rate, continuous pulse ox, blood pressure and cardiac monitor Approach: midline Location: L3-4 Injection technique: single-shot Needle Needle type: Whitacre and Introducer  Needle gauge: 24 G Needle length: 9 cm Assessment Sensory level: T10 Additional Notes Negative paresthesia. Negative blood return. Positive free-flowing CSF. Expiration date of kit checked and confirmed. Patient tolerated procedure well, without complications.

## 2015-11-03 NOTE — Brief Op Note (Signed)
11/03/2015  3:41 PM  PATIENT:  [name]  80 y.o. female  PRE-OPERATIVE DIAGNOSIS:  OSTEOARTHRITIS RIGHT KNEE  POST-OPERATIVE DIAGNOSIS:  OSTEOARTHRITIS RIGHT KNEE  PROCEDURE:  Procedure(s): COMPUTER ASSISTED TOTAL KNEE ARTHROPLASTY (Right)  SURGEON:  Surgeon(s) and Role:    * Dereck Leep, MD - Primary  ASSISTANTS: Rachelle Hora, PA-C   ANESTHESIA:   spinal  EBL:  Total I/O In: 1300 [I.V.:1300] Out: 500 [Urine:450; Blood:50]  BLOOD ADMINISTERED:none  DRAINS: 2 medium drains to a reinfusion system   LOCAL MEDICATIONS USED:  MARCAINE    and OTHER Exparel  SPECIMEN:  No Specimen  DISPOSITION OF SPECIMEN:  PATHOLOGY  COUNTS:  YES  TOURNIQUET:   94 minutes  DICTATION: .Dragon Dictation  PLAN OF CARE: Admit to inpatient   PATIENT DISPOSITION:  PACU - hemodynamically stable.   Delay start of Pharmacological VTE agent (>24hrs) due to surgical blood loss or risk of bleeding: yes

## 2015-11-03 NOTE — H&P (Signed)
The patient has been re-examined, and the chart reviewed, and there have been no interval changes to the documented history and physical.    The risks, benefits, and alternatives have been discussed at length. The patient expressed understanding of the risks benefits and agreed with plans for surgical intervention.  James P. Hooten, Jr. M.D.    

## 2015-11-03 NOTE — Transfer of Care (Signed)
Immediate Anesthesia Transfer of Care Note  [name]  Procedure(s) Performed: Procedure(s): COMPUTER ASSISTED TOTAL KNEE ARTHROPLASTY (Right)  Patient Location: PACU  Anesthesia Type:General  Level of Consciousness: sedated  Airway & Oxygen Therapy: Patient Spontanous Breathing and Patient connected to face mask oxygen  Post-op Assessment: Report given to RN and Post -op Vital signs reviewed and stable  Post vital signs: Reviewed and stable  Last Vitals:  Vitals:   11/03/15 1024 11/03/15 1540  BP: 134/75 103/57  Pulse: 84 73  Resp: 16 21  Temp: 36.6 C 37.1 C    Last Pain:  Vitals:   11/03/15 1024  TempSrc: Oral         Complications: No apparent anesthesia complications

## 2015-11-03 NOTE — Op Note (Signed)
OPERATIVE NOTE  DATE OF SURGERY:  11/03/2015  PATIENT NAME:  Elizabeth Trujillo   DOB: [birth year]  MRN: [id number]  PRE-OPERATIVE DIAGNOSIS: Degenerative arthrosis of the right knee, primary  POST-OPERATIVE DIAGNOSIS:  Same  PROCEDURE:  Right total knee arthroplasty using computer-assisted navigation  SURGEON:  Marciano Sequin. M.D.  ASSISTANT:  Vance Peper, PA (present and scrubbed throughout the case, critical for assistance with exposure, retraction, instrumentation, and closure)  ANESTHESIA: spinal  ESTIMATED BLOOD LOSS: 50 mL  FLUIDS REPLACED: 1300 mL of crystalloid  TOURNIQUET TIME: 94 minutes  DRAINS: 2 medium drains to a reinfusion system  SOFT TISSUE RELEASES: Anterior cruciate ligament, posterior cruciate ligament, deep medial collateral ligament, patellofemoral ligament  IMPLANTS UTILIZED: DePuy Attune size 6N (narrow) posterior stabilized femoral component (cemented), size 5 rotating platform tibial component (cemented), 35 mm medialized dome patella (cemented), and a 5 mm stabilized rotating platform polyethylene insert.  INDICATIONS FOR SURGERY: Elizabeth Trujillo is a 80 y.o. year old female with a long history of progressive knee pain. X-rays demonstrated severe degenerative changes in tricompartmental fashion. The patient had not seen any significant improvement despite conservative nonsurgical intervention. After discussion of the risks and benefits of surgical intervention, the patient expressed understanding of the risks benefits and agree with plans for total knee arthroplasty.   The risks, benefits, and alternatives were discussed at length including but not limited to the risks of infection, bleeding, nerve injury, stiffness, blood clots, the need for revision surgery, cardiopulmonary complications, among others, and they were willing to proceed.  PROCEDURE IN DETAIL: The patient was brought into the operating room and, after adequate spinal anesthesia was achieved,  a tourniquet was placed on the patient's upper thigh. The patient's knee and leg were cleaned and prepped with alcohol and DuraPrep and draped in the usual sterile fashion. A "timeout" was performed as per usual protocol. The lower extremity was exsanguinated using an Esmarch, and the tourniquet was inflated to 300 mmHg. An anterior longitudinal incision was made followed by a standard mid vastus approach. The deep fibers of the medial collateral ligament were elevated in a subperiosteal fashion off of the medial flare of the tibia so as to maintain a continuous soft tissue sleeve. The patella was subluxed laterally and the patellofemoral ligament was incised. Inspection of the knee demonstrated severe degenerative changes with full-thickness loss of articular cartilage. Osteophytes were debrided using a rongeur. Anterior and posterior cruciate ligaments were excised. Two 4.0 mm Schanz pins were inserted in the femur and into the tibia for attachment of the array of trackers used for computer-assisted navigation. Hip center was identified using a circumduction technique. Distal landmarks were mapped using the computer. The distal femur and proximal tibia were mapped using the computer. The distal femoral cutting guide was positioned using computer-assisted navigation so as to achieve a 5 distal valgus cut. The femur was sized and it was felt that a size 6N (narrow) femoral component was appropriate. A size 6 femoral cutting guide was positioned and the anterior cut was performed and verified using the computer. This was followed by completion of the posterior and chamfer cuts. Femoral cutting guide for the central box was then positioned in the center box cut was performed.  Attention was then directed to the proximal tibia. Medial and lateral menisci were excised. The extramedullary tibial cutting guide was positioned using computer-assisted navigation so as to achieve a 0 varus-valgus alignment and 3  posterior slope. The cut was performed and verified using  the computer. The proximal tibia was sized and it was felt that a size 5 tibial tray was appropriate. Tibial and femoral trials were inserted followed by insertion of a 5 mm polyethylene insert. This allowed for excellent mediolateral soft tissue balancing both in flexion and in full extension. Finally, the patella was cut and prepared so as to accommodate a 35 mm medialized dome patella. A patella trial was placed and the knee was placed through a range of motion with excellent patellar tracking appreciated. The femoral trial was removed after debridement of posterior osteophytes. The central post-hole for the tibial component was reamed followed by insertion of a keel punch. Tibial trials were then removed. Cut surfaces of bone were irrigated with copious amounts of normal saline with antibiotic solution using pulsatile lavage and then suctioned dry. Polymethylmethacrylate cement was prepared in the usual fashion using a vacuum mixer. Cement was applied to the cut surface of the proximal tibia as well as along the undersurface of a size 5 rotating platform tibial component. Tibial component was positioned and impacted into place. Excess cement was removed using [employer] fast streamer. Cement was then applied to the cut surfaces of the femur as well as along the posterior flanges of the size 6N (narrow) femoral component. The femoral component was positioned and impacted into place. Excess cement was removed using [employer] fast streamer. A 5 mm polyethylene trial was inserted and the knee was brought into full extension with steady axial compression applied. Finally, cement was applied to the backside of a 35 mm medialized dome patella and the patellar component was positioned and patellar clamp applied. Excess cement was removed using [employer] fast streamer. After adequate curing of the cement, the tourniquet was deflated after a total tourniquet time of 94 minutes. Hemostasis  was achieved using electrocautery. The knee was irrigated with copious amounts of normal saline with antibiotic solution using pulsatile lavage and then suctioned dry. 20 mL of 1.3% Exparel and 60 mL of 0.25% Marcaine in 40 mL of normal saline was injected along the posterior capsule, medial and lateral gutters, and along the arthrotomy site. A 5 mm stabilized rotating platform polyethylene insert was inserted and the knee was placed through a range of motion with excellent mediolateral soft tissue balancing appreciated and excellent patellar tracking noted. 2 medium drains were placed in the wound bed and brought out through separate stab incisions to be attached to a reinfusion system. The medial parapatellar portion of the incision was reapproximated using interrupted sutures of #1 Vicryl. Subcutaneous tissue was approximated in layers using first #0 Vicryl followed #2-0 Vicryl. The skin was approximated with skin staples. A sterile dressing was applied.  The patient tolerated the procedure well and was transported to the recovery room in stable condition.    Alarik Radu P. Holley Bouche., M.D.

## 2015-11-04 ENCOUNTER — Encounter: Payer: Self-pay | Admitting: Orthopedic Surgery

## 2015-11-04 LAB — CBC
HCT: 31.4 % — ABNORMAL LOW (ref 35.0–47.0)
HEMOGLOBIN: 11 g/dL — AB (ref 12.0–16.0)
MCH: 31.5 pg (ref 26.0–34.0)
MCHC: 35 g/dL (ref 32.0–36.0)
MCV: 89.8 fL (ref 80.0–100.0)
Platelets: 264 10*3/uL (ref 150–440)
RBC: 3.49 MIL/uL — AB (ref 3.80–5.20)
RDW: 14.5 % (ref 11.5–14.5)
WBC: 13.5 10*3/uL — AB (ref 3.6–11.0)

## 2015-11-04 LAB — BASIC METABOLIC PANEL
Anion gap: 5 (ref 5–15)
BUN: 10 mg/dL (ref 6–20)
CHLORIDE: 107 mmol/L (ref 101–111)
CO2: 25 mmol/L (ref 22–32)
Calcium: 8.3 mg/dL — ABNORMAL LOW (ref 8.9–10.3)
Creatinine, Ser: 0.68 mg/dL (ref 0.44–1.00)
GFR calc non Af Amer: >60 mL/min (ref >60)
Glucose, Bld: 157 mg/dL — ABNORMAL HIGH (ref 65–99)
POTASSIUM: 3.8 mmol/L (ref 3.5–5.1)
SODIUM: 137 mmol/L (ref 135–145)

## 2015-11-04 NOTE — Anesthesia Postprocedure Evaluation (Signed)
Anesthesia Post Note  [name]  Procedure(s) Performed: Procedure(s) (LRB): COMPUTER ASSISTED TOTAL KNEE ARTHROPLASTY (Right)  Patient location during evaluation: Nursing Unit Anesthesia Type: Spinal Level of consciousness: awake, awake and alert and oriented Pain management: pain level controlled Vital Signs Assessment: post-procedure vital signs reviewed and stable Respiratory status: spontaneous breathing, nonlabored ventilation and respiratory function stable Cardiovascular status: blood pressure returned to baseline and stable Postop Assessment: no headache Anesthetic complications: no    Last Vitals:  Vitals:   11/03/15 2341 11/04/15 0416  BP: 106/72 (!) 110/59  Pulse: 61 66  Resp:  18  Temp: 36.4 C 36.4 C    Last Pain:  Vitals:   11/04/15 0455  TempSrc:   PainSc: 5                  Raydell Maners,  Jacy Howat R

## 2015-11-04 NOTE — NC FL2 (Signed)
[place] LEVEL OF CARE SCREENING TOOL     IDENTIFICATION  Patient Name: Elizabeth Trujillo Birthdate: Nov 25, 1935 Sex: female Admission Date (Current Location): 11/03/2015  [place] and [place] Number:  Engineering geologist and Address:  [facility], [address], [place], [place] [address]      Provider Number: [id number]  Attending Physician Name and Address:  Dereck Leep, MD  Relative Name and Phone Number:       Current Level of Care: Hospital Recommended Level of Care: [place] Prior Approval Number:    Date Approved/Denied:   PASRR Number:  ([id number])  Discharge Plan: SNF    Current Diagnoses: Patient Active Problem List   Diagnosis Date Noted  . S/P total knee arthroplasty 11/03/2015  . Knee pain 09/28/2015  . Myalgia 09/28/2015  . Allergic rhinitis 01/06/2015  . Confusion state 01/06/2015  . Dermatitis, eczematoid 01/06/2015  . Episode of hypertension 01/06/2015  . Hypercholesterolemia 01/06/2015  . Angiopathy 01/06/2015  . Malignant neoplasm of breast ([place]) 01/06/2015  . Adiposity 01/06/2015  . Osteoarthritis 01/06/2015  . Osteopenia 01/06/2015  . Avitaminosis D 01/06/2015    Orientation RESPIRATION BLADDER Height & Weight     Self, Time, Situation, Place  Normal Continent Weight: 199 lb (90.3 kg) Height:  5\' 3"  (160 cm)  BEHAVIORAL SYMPTOMS/MOOD NEUROLOGICAL BOWEL NUTRITION STATUS   (none )  (none ) Continent Diet (Regular Diet )  AMBULATORY STATUS COMMUNICATION OF NEEDS Skin   Extensive Assist Verbally Surgical wounds (Incision: Right Knee )                       Personal Care Assistance Level of Assistance  Bathing, Feeding, Dressing Bathing Assistance: Limited assistance Feeding assistance: Independent Dressing Assistance: Limited assistance     Functional Limitations Info  Sight, Hearing, Speech Sight Info: Adequate Hearing Info: Adequate Speech Info: Adequate     SPECIAL CARE FACTORS FREQUENCY  PT (By licensed PT), OT (By licensed OT)     PT Frequency:  (5) OT Frequency:  (5)            Contractures      Additional Factors Info  Code Status, Allergies Code Status Info:  (Full Code. ) Allergies Info:  (Penicillin V, Penicillin V Potassium, Sulfa Antibiotics)           Current Medications (11/04/2015):  This is the current hospital active medication list Current Facility-Administered Medications  Medication Dose Route Frequency Provider Last Rate Last Dose  . 0.9 %  sodium chloride infusion   Intravenous Continuous Dereck Leep, MD 100 mL/hr at 11/04/15 0255    . acetaminophen (OFIRMEV) IV 1,000 mg  1,000 mg Intravenous Q6H Dereck Leep, MD   1,000 mg at 11/04/15 0441  . acetaminophen (TYLENOL) tablet 650 mg  650 mg Oral Q6H PRN Dereck Leep, MD       Or  . acetaminophen (TYLENOL) suppository 650 mg  650 mg Rectal Q6H PRN Dereck Leep, MD      . alum & mag hydroxide-simeth (MAALOX/MYLANTA) 200-200-20 MG/5ML suspension 30 mL  30 mL Oral Q4H PRN Dereck Leep, MD      . bisacodyl (DULCOLAX) suppository 10 mg  10 mg Rectal Daily PRN Dereck Leep, MD      . calcium-vitamin D (OSCAL WITH D) 500-200 MG-UNIT per tablet 1 tablet  1 tablet Oral Q breakfast Sheema M Hallaji, RPH   1 tablet at  11/04/15 0919  . cholecalciferol (VITAMIN D) tablet 2,000 Units  2,000 Units Oral QHS Dereck Leep, MD   2,000 Units at 11/03/15 2108  . clindamycin (CLEOCIN) IVPB 600 mg  600 mg Intravenous Q6H Dereck Leep, MD   600 mg at 11/04/15 0522  . diphenhydrAMINE (BENADRYL) 12.5 MG/5ML elixir 12.5-25 mg  12.5-25 mg Oral Q4H PRN Dereck Leep, MD      . enoxaparin (LOVENOX) injection 30 mg  30 mg Subcutaneous Q12H Dereck Leep, MD   30 mg at 11/04/15 0919  . fenofibrate tablet 160 mg  160 mg Oral Daily Dereck Leep, MD   160 mg at 11/04/15 [id number]  . fentaNYL (SUBLIMAZE) injection 25-50 mcg  25-50 mcg Intravenous Q5 min PRN Andria Frames, MD    50 mcg at 11/03/15 1625  . ferrous sulfate tablet 325 mg  325 mg Oral BID WC Dereck Leep, MD   325 mg at 11/04/15 0920  . magnesium hydroxide (MILK OF MAGNESIA) suspension 30 mL  30 mL Oral Daily PRN Dereck Leep, MD      . menthol-cetylpyridinium (CEPACOL) lozenge 3 mg  1 lozenge Oral PRN Dereck Leep, MD       Or  . phenol (CHLORASEPTIC) mouth spray 1 spray  1 spray Mouth/Throat PRN Dereck Leep, MD      . metoCLOPramide (REGLAN) tablet 10 mg  10 mg Oral TID AC & HS Dereck Leep, MD   10 mg at 11/04/15 0919  . morphine 2 MG/ML injection 2 mg  2 mg Intravenous Q2H PRN Dereck Leep, MD   2 mg at 11/04/15 0428  . multivitamin with minerals tablet 1 tablet  1 tablet Oral Daily Dereck Leep, MD   1 tablet at 11/04/15 0920  . omega-3 acid ethyl esters (LOVAZA) capsule 1 g  1 g Oral Daily Dereck Leep, MD   1 g at 11/04/15 0920  . ondansetron (ZOFRAN) tablet 4 mg  4 mg Oral Q6H PRN Dereck Leep, MD       Or  . ondansetron (ZOFRAN) injection 4 mg  4 mg Intravenous Q6H PRN Dereck Leep, MD      . oxyCODONE (Oxy IR/ROXICODONE) immediate release tablet 5-10 mg  5-10 mg Oral Q4H PRN Dereck Leep, MD   5 mg at 11/04/15 1046  . pantoprazole (PROTONIX) EC tablet 40 mg  40 mg Oral BID Dereck Leep, MD   40 mg at 11/04/15 0920  . senna-docusate (Senokot-S) tablet 1 tablet  1 tablet Oral BID Dereck Leep, MD   1 tablet at 11/04/15 0920  . sodium phosphate (FLEET) 7-19 GM/118ML enema 1 enema  1 enema Rectal Once PRN Dereck Leep, MD      . traMADol Veatrice Bourbon) tablet 50-100 mg  50-100 mg Oral Q4H PRN Dereck Leep, MD   100 mg at 11/04/15 [id number]     Discharge Medications: Please see discharge summary for a list of discharge medications.  Relevant Imaging Results:  Relevant Lab Results:   Additional Information  (SSN: [id number])  Sample, Veronia Beets, LCSW

## 2015-11-04 NOTE — Progress Notes (Signed)
Clinical Education officer, museum (CSW) presented bed offers to patient's husband and he chose [name]. [place] admissions coordinator at [facility] is aware of accepted bed offer. CSW will continue to follow and assist as needed.   [employer], LCSW [phone]

## 2015-11-04 NOTE — Progress Notes (Signed)
OT Cancellation Note  Patient Details Name: Elizabeth Trujillo MRN: [id number] DOB: 1935/12/29   Cancelled Treatment:    Reason Eval/Treat Not Completed: Fatigue/lethargy limiting ability to participate Pt currently in a lot of pain and NSG attempting to establish pain medication regime.  Pt not able to tolerate OT evaluation at this time.  Will attempt evaluation later today time permitting or tomorrow.  Thank you for the referral.  Chrys Racer, OTR/L ascom [phone] 11/04/15, 12:06 PM'

## 2015-11-04 NOTE — Progress Notes (Signed)
Physical Therapy Treatment Patient Details Name: Elizabeth Trujillo MRN: [id number] DOB: 17-May-1935 Today's Date: 11/04/2015    History of Present Illness 80 y/o female here with R TKA 8/23    PT Comments    Pt did well with exercises and shows improved activity tolerance and quality with ambulation.  She still c/o considerable pain with exercises and WBing, but this is not overly limiting and she shows improvement with each of these.  Pt eager to work hard and did not need rails to get in/out of bed this afternoon session.  PT making good POD1 gains.   Follow Up Recommendations  SNF (pt still hoping to go home)     Equipment Recommendations       Recommendations for Other Services       Precautions / Restrictions Precautions Precautions: Fall Restrictions Weight Bearing Restrictions: Yes RLE Weight Bearing: Weight bearing as tolerated    Mobility  Bed Mobility Overal bed mobility: Modified Independent             General bed mobility comments: Pt was slow and labored getting in/out of bed she was able to go supine<>sit with out direct assist  Transfers Overall transfer level: Needs assistance Equipment used: Rolling walker (2 wheeled) Transfers: Sit to/from Stand Sit to Stand: Min guard;Min assist         General transfer comment: Pt again needing cuing for hand placement with rising and returning to sitting.  Pt able to shift weight forward and use both LEs to rise  Ambulation/Gait Ambulation/Gait assistance: Min assist Ambulation Distance (Feet): 35 Feet Assistive device: Rolling walker (2 wheeled)       General Gait Details: Pt able to considerably increase confidence and quality of ambulation today and though she still has some hesitancy and pain and c/o fatigue she ultimately achieves POD1 goals to the door and back.  Pt highly motivated and able to push herself.   Stairs            Wheelchair Mobility    Modified Rankin (Stroke Patients  Only)       Balance Overall balance assessment: Modified Independent                                  Cognition Arousal/Alertness: Awake/alert Behavior During Therapy: WFL for tasks assessed/performed Overall Cognitive Status: Within Functional Limits for tasks assessed                      Exercises Total Joint Exercises Ankle Circles/Pumps: AROM;10 reps Quad Sets: Strengthening;10 reps Gluteal Sets: Strengthening;10 reps Short Arc Quad: 10 reps;Strengthening Heel Slides: AAROM;5 reps Hip ABduction/ADduction: Strengthening;10 reps Straight Leg Raises: PROM;5 reps Knee Flexion: PROM;5 reps Goniometric ROM: 1-62    General Comments        Pertinent Vitals/Pain Pain Assessment: 0-10 Pain Score: 6  (minimal at rest)    Home Living Family/patient expects to be discharged to:: Private residence Living Arrangements: Spouse/significant other   Type of Home: House Home Access: Ramped entrance     Home Equipment: Environmental consultant - 2 wheels;Bedside commode      Prior Function Level of Independence: Independent      Comments: occasionally uses a walker, generally pt is able to be active driving, going to church, brief shopping trips   PT Goals (current goals can now be found in the care plan section) Acute Rehab PT Goals Patient Stated Goal: "  I'd like to go home" PT Goal Formulation: With patient/family Time For Goal Achievement: 11/04/15 Potential to Achieve Goals: Fair Progress towards PT goals: Progressing toward goals    Frequency  BID    PT Plan Current plan remains appropriate    Co-evaluation             End of Session Equipment Utilized During Treatment: Gait belt Activity Tolerance: Patient limited by pain;Patient limited by fatigue       Time: [id number] PT Time Calculation (min) (ACUTE ONLY): 26 min  Charges:  $Gait Training: 8-22 mins $Therapeutic Exercise: 8-22 mins                    G Codes:      Kreg Shropshire,  DPT 11/04/2015, 3:58 PM

## 2015-11-04 NOTE — Evaluation (Signed)
Physical Therapy Evaluation Patient Details Name: Elizabeth Trujillo MRN: [id number] DOB: October 20, 1935 Today's Date: 11/04/2015   History of Present Illness  80 y/o female here with R TKA 8/23  Clinical Impression  Pt initially hesitant to participate and reports that she did not expect to hurt as much as it does.  She was able to coordinate pain meds and despite hesitancy (and lethargy) she was able to participate well with light exercises, ROM and limited ambulation.  Pt motivated to go home, per today's performance we should count on rehab, but if she continues to make good improvements she may be able to safely go home.     Follow Up Recommendations SNF (per progress, would like to go home if possible)    Equipment Recommendations       Recommendations for Other Services       Precautions / Restrictions Restrictions Weight Bearing Restrictions: Yes RLE Weight Bearing: Weight bearing as tolerated      Mobility  Bed Mobility               General bed mobility comments: Pt in sitting on arrival, not tested  Transfers Overall transfer level: Needs assistance Equipment used: Rolling walker (2 wheeled) Transfers: Sit to/from Stand Sit to Stand: Min assist         General transfer comment: Pt needs cuing for hand placement, foot set up and encouragement but overall shows good effort in getting to standing needing only light assist  Ambulation/Gait Ambulation/Gait assistance: Min assist Ambulation Distance (Feet): 6 Feet Assistive device: Rolling walker (2 wheeled)       General Gait Details: Pt is able to do very slow, cautious steps with little more than 1-2 inches per step but no LOBs.  She becomes very fatigued in her UEs but overall did well with the effort.   Stairs            Wheelchair Mobility    Modified Rankin (Stroke Patients Only)       Balance                                             Pertinent Vitals/Pain       Home Living Family/patient expects to be discharged to:: Private residence Living Arrangements: Spouse/significant other   Type of Home: House Home Access: [place]: Environmental consultant - 2 wheels;Bedside commode      Prior Function Level of Independence: Independent         Comments: occasionally uses a walker, generally pt is able to be active driving, going to church, brief shopping trips     Hand Dominance        Extremity/Trunk Assessment   Upper Extremity Assessment: Overall WFL for tasks assessed           Lower Extremity Assessment: RLE deficits/detail RLE Deficits / Details: Pt grossly has 3/5 strength in R hip and knee, unable to do SLR, has 4/5 ankle PF/DF       Communication   Communication: No difficulties  Cognition Arousal/Alertness: Awake/alert;Lethargic Behavior During Therapy: Restless;Anxious Overall Cognitive Status: Within Functional Limits for tasks assessed                      General Comments      Exercises Total Joint Exercises Ankle Circles/Pumps: AROM;10 reps  Quad Sets: Strengthening;10 reps Gluteal Sets: Strengthening;10 reps Short Arc Quad: 5 reps;Strengthening Heel Slides: AAROM;5 reps Hip ABduction/ADduction: Strengthening;10 reps Straight Leg Raises: PROM;5 reps Knee Flexion: PROM;5 reps Goniometric ROM: 1-62      Assessment/Plan    PT Assessment Patient needs continued PT services  PT Diagnosis Difficulty walking;Generalized weakness   PT Problem List Decreased strength  PT Treatment Interventions DME instruction;Gait training;Stair training;Functional mobility training;Therapeutic activities;Therapeutic exercise;Balance training;Patient/family education   PT Goals (Current goals can be found in the Care Plan section) Acute Rehab PT Goals Patient Stated Goal: "I'd like to go home" PT Goal Formulation: With patient/family Time For Goal Achievement: 11/04/15 Potential to Achieve  Goals: Fair    Frequency BID   Barriers to discharge        Co-evaluation               End of Session Equipment Utilized During Treatment: Gait belt Activity Tolerance: Patient limited by pain;Patient limited by fatigue;Treatment limited secondary to agitation             Time: [id number] PT Time Calculation (min) (ACUTE ONLY): 54 min   Charges:   PT Evaluation $PT Eval Low Complexity: 1 Procedure PT Treatments $Therapeutic Exercise: 8-22 mins   PT G Codes:        Kreg Shropshire, DPT 11/04/2015, 1:52 PM

## 2015-11-04 NOTE — Clinical Social Work Placement (Signed)
   CLINICAL SOCIAL WORK PLACEMENT  NOTE  Date:  11/04/2015  Patient Details  Name: Elizabeth Trujillo MRN: [id number] Date of Birth: 05/14/1935  Clinical Social Work is seeking post-discharge placement for this patient at the [place] level of care (*CSW will initial, date and re-position this form in  chart as items are completed):  Yes   Patient/family provided with [facility] Work Department's list of facilities offering this level of care within the geographic area requested by the patient (or if unable, by the patient's family).  Yes   Patient/family informed of their freedom to choose among providers that offer the needed level of care, that participate in Medicare, Medicaid or managed care program needed by the patient, have an available bed and are willing to accept the patient.  Yes   Patient/family informed of [place]'s ownership interest in [facility] and [facility], as well as of the fact that they are under no obligation to receive care at these facilities.  PASRR submitted to EDS on 11/04/15     PASRR number received on 11/04/15     Existing PASRR number confirmed on       FL2 transmitted to all facilities in geographic area requested by pt/family on 11/04/15     FL2 transmitted to all facilities within larger geographic area on       Patient informed that his/her managed care company has contracts with or will negotiate with certain facilities, including the following:            Patient/family informed of bed offers received.  Patient chooses bed at       Physician recommends and patient chooses bed at      Patient to be transferred to   on  .  Patient to be transferred to facility by       Patient family notified on   of transfer.  Name of family member notified:        PHYSICIAN       Additional Comment:    _______________________________________________ Yasenia Reedy, [place] Beets, LCSW 11/04/2015, 11:55 AM

## 2015-11-04 NOTE — Progress Notes (Addendum)
   Subjective: 1 Day Post-Op Procedure(s) (LRB): COMPUTER ASSISTED TOTAL KNEE ARTHROPLASTY (Right) Patient reports pain as 5 on 0-10 scale.   Patient is well, but has had some minor complaints of right total knee pain 5/10.  Denies any CP, SOB, ABD pain. We will continue therapy today.    Objective: Vital signs in last 24 hours: Temp:  [97.5 F (36.4 C)-98.7 F (37.1 C)] 97.6 F (36.4 C) (08/24 0416) Pulse Rate:  [60-84] 66 (08/24 0416) Resp:  [16-20] 18 (08/24 0416) BP: (106-134)/(55-75) 110/59 (08/24 0416) SpO2:  [97 %-100 %] 98 % (08/24 0416) Weight:  [90.3 kg (199 lb)] 90.3 kg (199 lb) (08/23 1024)  Intake/Output from previous day: 08/23 0701 - 08/24 0700 In: 3133.3 [I.V.:2583.3; IV Piggyback:550] Out: 1900 [id number]; Drains:200; Blood:50] Intake/Output this shift: No intake/output data recorded.   Recent Labs  11/03/15 1100 11/04/15 0429  HGB 13.9 11.0*    Recent Labs  11/03/15 1100 11/04/15 0429  WBC  --  13.5*  RBC  --  3.49*  HCT 41.0 31.4*  PLT  --  264    Recent Labs  11/03/15 1100 11/03/15 1839 11/04/15 0429  NA 144  --  137  K 3.6  --  3.8  CL  --   --  107  CO2  --   --  25  BUN  --   --  10  CREATININE  --  0.56 0.68  GLUCOSE 114*  --  157*  CALCIUM  --   --  8.3*   No results for input(s): LABPT, INR in the last 72 hours.  EXAM General - Patient is Alert, Appropriate and Oriented Extremity - Neurovascular intact Sensation intact distally Intact pulses distally Dorsiflexion/Plantar flexion intact No cellulitis present Compartment soft Dressing - dressing C/D/I and Hemovac intact Motor Function - intact, moving foot and toes well on exam.   Past Medical History:  Diagnosis Date  . Arthritis   . Cancer ([place]) 1986   left breast  . Elevated triglycerides with high cholesterol   . GERD (gastroesophageal reflux disease)    history of reflux  . Hypercholesterolemia     Assessment/Plan:   1 Day Post-Op Procedure(s)  (LRB): COMPUTER ASSISTED TOTAL KNEE ARTHROPLASTY (Right) Active Problems:   S/P total knee arthroplasty   Acute post op blood loss anemia   Estimated body mass index is 35.25 kg/m as calculated from the following:   Height as of this encounter: 5\' 3"  (1.6 m).   Weight as of this encounter: 90.3 kg (199 lb). Advance diet Up with therapy  Needs bowel movement Care management to assist with discharge Labs are stable, recheck labs in the morning   DVT Prophylaxis - Lovenox, Foot Pumps and TED hose Weight-Bearing as tolerated to right leg   T. Rachelle Hora, PA-C [place] 11/04/2015, 7:37 AM

## 2015-11-04 NOTE — Clinical Social Work Note (Signed)
Clinical Social Work Assessment  Patient Details  Name: Elizabeth Trujillo MRN: [id number] Date of Birth: 10-27-1935  Date of referral:  11/04/15               Reason for consult:  Facility Placement                Permission sought to share information with:  Chartered certified accountant granted to share information::  Yes, Verbal Permission Granted  Name::      Elizabeth Trujillo::   [name]   Relationship::     Contact Information:     Housing/Transportation Living arrangements for the past 2 months:  Elizabeth Trujillo of Information:  Patient, Adult Children, Spouse Patient Interpreter Needed:  None Criminal Activity/Legal Involvement Pertinent to Current Situation/Hospitalization:  No - Comment as needed Significant Relationships:  Adult Children, Spouse Lives with:  Spouse Do you feel safe going back to the place where you live?  Yes Need for family participation in patient care:  Yes (Comment)  Care giving concerns:  Patient lives in [place] with her husband Elizabeth Trujillo.    Social Worker assessment / plan:  Holiday representative (CSW) received SNF consult. PT is recommending SNF and patient may progress to home health. CSW met with patient and her husband Elizabeth Trujillo, daughter Elizabeth Trujillo and granddaughter were at bedside. CSW introduced self and explained role of CSW department. Patient was alert and oriented and sitting in the chair. Per patient she lives in [place] with her husband and has 2 adult daughters that live in the area and 1 adult son that lives out of state. CSW explained that PT is recommending SNF and that she will require a 3 night inpatient qualifying stay in order for Medicare to cover SNF. Patient prefers to go home however she is agreeable to SNF search and prefers Elizabeth Trujillo. Per patient's husband Elizabeth Trujillo he is an Human resources officer at [employer] and would also prefer for patient to go there for STR if needed. SNF list provided.    FL2 complete and faxed out. CSW will continue to follow and assist as needed.   Employment status:  Retired Forensic scientist:  Medicare PT Recommendations:  Elizabeth Trujillo / Referral to community resources:  Elizabeth Trujillo  Patient/Family's Response to care:  Patient and husband are agreeable to Elizabeth Trujillo and prefer Elizabeth Trujillo.   Patient/Family's Understanding of and Emotional Response to Diagnosis, Current Treatment, and Prognosis:  Patient and her family were very pleasant and thanked CSW for visit.   Emotional Assessment Appearance:  Appears stated age Attitude/Demeanor/Rapport:    Affect (typically observed):  Accepting, Adaptable, Pleasant Orientation:  Oriented to Self, Oriented to Place, Oriented to  Time, Oriented to Situation Alcohol / Substance use:  Not Applicable Psych involvement (Current and /or in the community):  No (Comment)  Discharge Needs  Concerns to be addressed:  Discharge Planning Concerns Readmission within the last 30 days:  No Current discharge risk:  Dependent with Mobility Barriers to Discharge:  Continued Medical Work up   [employer], Elizabeth Beets, LCSW 11/04/2015, 11:56 AM

## 2015-11-04 NOTE — Care Management Note (Addendum)
Case Management Note  Patient Details  Name: Elizabeth Trujillo MRN: [id number] Date of Birth: 1935/12/09  Subjective/Objective:    Spoke with patient with husband present in room. She is alert and oriented. Husband is independent and able to assist patient.  Patient is recommended for SNF at this time but I expect her to progress to [facility]. Pharmacy is [facility]. Lovenox 40mg  injection daily for 14 days called to pharmacy. Choice of [facility] provider given and patient would like to go with [facility]. Referral placed with Corliss Blacker at [facility].  Patient has a front rolling walker and bedside commode at home.  Lovenox co pay $53.65.       Action/Plan: Anticipated discharge to home with home health in 1 -2 days.  Expected Discharge Date:                  Expected Discharge Plan:  [place]  In-House Referral:     Discharge planning Services  CM Consult  Post Acute Care Choice:  NA Choice offered to:     DME Arranged:   (Has walker and bedside commode.) DME Agency:  NA  HH Arranged:  PT HH Agency:  [facility] (now [facility])  Status of Service:  In process, will continue to follow  If discussed at Long Length of Stay Meetings, dates discussed:    Additional Comments: Lovenox co pay $53.65  Alvie Heidelberg, RN 11/04/2015, 2:45 PM

## 2015-11-04 NOTE — Progress Notes (Signed)
Foley d/c'd at 0520 with 350cc urine output

## 2015-11-05 ENCOUNTER — Encounter
Admission: RE | Admit: 2015-11-05 | Discharge: 2015-11-05 | Disposition: A | Discharge status: Home / Self Care | Payer: Medicare Other | Source: Ambulatory Visit | Attending: Internal Medicine | Admitting: Internal Medicine

## 2015-11-05 LAB — URINALYSIS COMPLETE WITH MICROSCOPIC (ARMC ONLY)
BILIRUBIN URINE: NEGATIVE
GLUCOSE, UA: NEGATIVE mg/dL
HGB URINE DIPSTICK: NEGATIVE
KETONES UR: NEGATIVE mg/dL
Leukocytes, UA: NEGATIVE
NITRITE: NEGATIVE
Protein, ur: NEGATIVE mg/dL
SPECIFIC GRAVITY, URINE: 1.003 — AB (ref 1.005–1.030)
pH: 6 (ref 5.0–8.0)

## 2015-11-05 LAB — BASIC METABOLIC PANEL
Anion gap: 5 (ref 5–15)
BUN: 8 mg/dL (ref 6–20)
CALCIUM: 8.3 mg/dL — AB (ref 8.9–10.3)
CHLORIDE: 104 mmol/L (ref 101–111)
CO2: 27 mmol/L (ref 22–32)
CREATININE: 0.62 mg/dL (ref 0.44–1.00)
Glucose, Bld: 135 mg/dL — ABNORMAL HIGH (ref 65–99)
Potassium: 3.5 mmol/L (ref 3.5–5.1)
SODIUM: 136 mmol/L (ref 135–145)

## 2015-11-05 LAB — CBC
HCT: 32.7 % — ABNORMAL LOW (ref 35.0–47.0)
HEMOGLOBIN: 11.4 g/dL — AB (ref 12.0–16.0)
MCH: 31 pg (ref 26.0–34.0)
MCHC: 34.8 g/dL (ref 32.0–36.0)
MCV: 89.3 fL (ref 80.0–100.0)
PLATELETS: 269 10*3/uL (ref 150–440)
RBC: 3.67 MIL/uL — AB (ref 3.80–5.20)
RDW: 14.8 % — ABNORMAL HIGH (ref 11.5–14.5)
WBC: 14.4 10*3/uL — ABNORMAL HIGH (ref 3.6–11.0)

## 2015-11-05 MED ORDER — OXYCODONE HCL 5 MG PO TABS: 5.0000 mg | ORAL_TABLET | ORAL | 0 refills | Status: DC | PRN

## 2015-11-05 MED ORDER — POTASSIUM CHLORIDE CRYS ER 20 MEQ PO TBCR
20.0000 meq | EXTENDED_RELEASE_TABLET | Freq: Every day | ORAL | Status: DC
  Administered 2015-11-05 – 2015-11-06 (×2): 20 meq via ORAL
  Filled 2015-11-05 (×2): qty 1

## 2015-11-05 MED ORDER — TRAMADOL HCL 50 MG PO TABS: 50.0000 mg | ORAL_TABLET | ORAL | 0 refills | Status: DC | PRN

## 2015-11-05 MED ORDER — ENOXAPARIN SODIUM 40 MG/0.4ML ~~LOC~~ SOLN: 40.0000 mg | SUBCUTANEOUS | 0 refills | Status: DC

## 2015-11-05 NOTE — Progress Notes (Signed)
Physical Therapy Treatment Patient Details Name: Elizabeth Trujillo MRN: [id number] DOB: 1936/02/06 Today's Date: 11/05/2015    History of Present Illness 80 y/o female here with R TKA 8/23    PT Comments    Pt continues to be very motivated and show good effort but was unable to show significant functional gains this afternoon.  She needed assist to get LEs into bed, and again was very fatigued with ~70 ft of ambulation.  She did show ability to hold SLR against gravity with tremendous effort and assist to raise LE. Pt doing well but does not feel able/safe going home and likely would benefit from going to short term rehab until she is more functional, confident and less limited secondary to pain.  Follow Up Recommendations  SNF     Equipment Recommendations       Recommendations for Other Services       Precautions / Restrictions Precautions Precautions: Fall Restrictions RLE Weight Bearing: Weight bearing as tolerated    Mobility  Bed Mobility Overal bed mobility: Needs Assistance Bed Mobility: Sit to Supine       Sit to supine: Min assist   General bed mobility comments: Pt unable to raise LE into bed this afternoon, showed good effort but pain and weakness were too much, necessitating assist  Transfers Overall transfer level: Needs assistance Equipment used: Rolling walker (2 wheeled) Transfers: Sit to/from Stand Sit to Stand: Min guard;Min assist         General transfer comment: Pt continues to be slow, but motivated during transfers.  She needed cuing for positioning and motivation.  Ambulation/Gait Ambulation/Gait assistance: Min assist Ambulation Distance (Feet): 70 Feet Assistive device: Rolling walker (2 wheeled)       General Gait Details: Pt again starts out very slowly and cautiously, she does find some increased confidence quicker this afternoon, but remains guarded, slow and very fatigued with the effort.  Pt with no LOBs but unable to go further  this afternoon.   Stairs            Wheelchair Mobility    Modified Rankin (Stroke Patients Only)       Balance                                    Cognition Arousal/Alertness: Awake/alert Behavior During Therapy: WFL for tasks assessed/performed Overall Cognitive Status: Within Functional Limits for tasks assessed                      Exercises Total Joint Exercises Ankle Circles/Pumps: AROM;10 reps Quad Sets: Strengthening;15 reps Gluteal Sets: 15 reps;Strengthening Short Arc Quad: AROM;Strengthening;10 reps Heel Slides: AAROM;AROM;10 reps Hip ABduction/ADduction: 15 reps;Strengthening Straight Leg Raises: AAROM;AROM;10 reps (Able to hold last 2 reps against gravity w/ AAROM to raise) Knee Flexion: PROM;5 reps Goniometric ROM: 0-73 (considerable discomfort with this effort)    General Comments        Pertinent Vitals/Pain Pain Score: 7  Pain Descriptors / Indicators:  (less hyperresponsiveness with movement, still pain limited)    Home Living                      Prior Function            PT Goals (current goals can now be found in the care plan section) Progress towards PT goals: Progressing toward goals    Frequency  BID    PT Plan Current plan remains appropriate    Co-evaluation             End of Session Equipment Utilized During Treatment: Gait belt Activity Tolerance: Patient limited by pain;Patient limited by fatigue Patient left: with bed alarm set;with call bell/phone within reach     Time: 1427-1456 PT Time Calculation (min) (ACUTE ONLY): 29 min  Charges:  $Gait Training: 8-22 mins $Therapeutic Exercise: 8-22 mins                    G Codes:      Kreg Shropshire, DPT 11/05/2015, 3:25 PM

## 2015-11-05 NOTE — Progress Notes (Signed)
Physical Therapy Treatment Patient Details Name: Elizabeth Trujillo MRN: [id number] DOB: 07-Nov-1935 Today's Date: 11/05/2015    History of Present Illness 80 y/o female here with R TKA 8/23    PT Comments    Pt still and pain guarded this morning, but shows good motivation to push through exercises and mobility acts and increased gait distance nicely.  She was very fatigued with the effort but ultimately did well and was able to adjust gait per cuing (step length, AD manipulation and posture) with increased time standing.  Despite pain pt makes good effort with PT session.   Follow Up Recommendations  SNF (If pt does well this afternoon she may be able to go home)     Equipment Recommendations       Recommendations for Other Services       Precautions / Restrictions Precautions Precautions: Fall Restrictions Weight Bearing Restrictions: Yes RLE Weight Bearing: Weight bearing as tolerated    Mobility  Bed Mobility Overal bed mobility: Modified Independent             General bed mobility comments: Pt was slow and labored getting out of bed she was able to go supine>sit with out direct assist or rail use  Transfers Overall transfer level: Needs assistance Equipment used: Rolling walker (2 wheeled) Transfers: Sit to/from Stand Sit to Stand: Min assist         General transfer comment: Pt stiff this AM and needed light assist to keep hips forward during transition and overall is very guarded/hesitant with the transition.  Ambulation/Gait Ambulation/Gait assistance: Min assist Ambulation Distance (Feet): 70 Feet Assistive device: Rolling walker (2 wheeled)       General Gait Details: Pt initially very slow and guarded with ambulation and c/o UE fatigue rather quickly but she is able to push through and walk to the nurses' station.  She showed good effort despite pain and hesitancy and improved step length and speed with cuing and increased time standing.     Stairs            Wheelchair Mobility    Modified Rankin (Stroke Patients Only)       Balance                                    Cognition Arousal/Alertness: Awake/alert Behavior During Therapy: WFL for tasks assessed/performed Overall Cognitive Status: Within Functional Limits for tasks assessed                      Exercises Total Joint Exercises Ankle Circles/Pumps: AROM;10 reps Quad Sets: Strengthening;15 reps Gluteal Sets: 15 reps;Strengthening Short Arc Quad: AROM;Strengthening;10 reps Heel Slides: AAROM;AROM;10 reps Hip ABduction/ADduction: Strengthening;10 reps Straight Leg Raises: AAROM;5 reps Knee Flexion: PROM;5 reps Goniometric ROM: 0-71    General Comments        Pertinent Vitals/Pain Pain Assessment: 0-10 Pain Score: 8  Pain Location: R knee Pain Descriptors / Indicators:  (continues to c/o severe pain with most movement) Pain Intervention(s): Limited activity within patient's tolerance;Monitored during session;Ice applied;Repositioned;Premedicated before session    [place] expects to be discharged to:: Private residence Living Arrangements: Spouse/significant other   Type of Home: House Home Access: [place]: One [place]: Environmental consultant - 2 wheels;Bedside commode      Prior Function Level of Independence: Independent      Comments: occasionally uses a  walker, generally pt is able to be active driving, going to church, brief shopping trips   PT Goals (current goals can now be found in the care plan section) Acute Rehab PT Goals Patient Stated Goal: "I'd like to go home" Progress towards PT goals: Progressing toward goals    Frequency  BID    PT Plan Current plan remains appropriate    Co-evaluation             End of Session Equipment Utilized During Treatment: Gait belt Activity Tolerance: Patient limited by pain;Patient limited by fatigue Patient  left: with nursing/sitter in room;in chair     Time: 0910-0940 PT Time Calculation (min) (ACUTE ONLY): 30 min  Charges:  $Gait Training: 8-22 mins $Therapeutic Exercise: 8-22 mins                    G Codes:      Kreg Shropshire, DPT 11/05/2015, 1:32 PM

## 2015-11-05 NOTE — Evaluation (Signed)
Occupational Therapy Evaluation Patient Details Name: Elizabeth Trujillo MRN: [id number] DOB: [birth year] Today's Date: 11/05/2015    History of Present Illness 80 y/o female here with R TKA 8/23   Clinical Impression   Pt is 80  year old female s/p R TKA.  Pt was independent in all ADLs prior to surgery and is eager to return to PLOF.  Pt currently requires moderate assist for LB dressing while in seated position due to anxiety, pain and limited AROM of R knee.  Pt would benefit from instruction in dressing techniques with or without assistive devices for dressing and bathing skills.  Pt would also benefit from recommendations for home modifications to increase safety in the bathroom and prevent falls. Rec OT HH after discharge.      Follow Up Recommendations  Home health OT    Equipment Recommendations   (rec reacher and sock aid)    Recommendations for Other Services       Precautions / Restrictions Precautions Precautions: Fall Restrictions Weight Bearing Restrictions: Yes RLE Weight Bearing: Weight bearing as tolerated      Mobility Bed Mobility                  Transfers                      Balance                                            ADL Overall ADL's : Needs assistance/impaired Eating/Feeding: Independent;Set up   Grooming: Wash/dry hands;Wash/dry face;Oral care;Applying deodorant;Brushing hair;Independent;Set up           Upper Body Dressing : Independent;Set up   Lower Body Dressing: Moderate assistance;Set up Lower Body Dressing Details (indicate cue type and reason): without AD.  Pt would benefit from AD but is used to her husband assisting.  Discussed importance of independence with ADLs as much as she can do herself. Toilet Transfer: Set up;Stand-pivot;Minimal Print production planner Details (indicate cue type and reason): using BSC           General ADL Comments: Pt was finishing with PT upon OTs  arrival and pt needed to go to the bathroom and has been using BSC.  Assisted with set up and pt required min assist and cues for sitting down on BSC and assist for wiping standing since she was not confident of her balance to do this.  Discussed use of AD but pt wanting to get back in bed after being up for a while and did not practice use of AD.  Sitting EOB, pt required mod assist for reaching RLE to remove sock.     Vision     Perception     Praxis      Pertinent Vitals/Pain Pain Assessment: 0-10 Pain Score: 4  Pain Location: R knee Pain Descriptors / Indicators: Aching;Constant;Discomfort Pain Intervention(s): Limited activity within patient's tolerance;Monitored during session;Ice applied;Repositioned;Premedicated before session     Hand Dominance Right   Extremity/Trunk Assessment Upper Extremity Assessment Upper Extremity Assessment: Overall WFL for tasks assessed   Lower Extremity Assessment Lower Extremity Assessment: Defer to PT evaluation       Communication Communication Communication: No difficulties   Cognition Arousal/Alertness: Awake/alert Behavior During Therapy: WFL for tasks assessed/performed Overall Cognitive Status: Within Functional Limits for tasks assessed  General Comments       Exercises       Shoulder Instructions      Home Living Family/patient expects to be discharged to:: Private residence Living Arrangements: Spouse/significant other   Type of Home: House Home Access: [place]: One level         Bathroom Toilet: [place]: Environmental consultant - 2 wheels;Bedside commode          Prior Functioning/Environment Level of Independence: Independent        Comments: occasionally uses a walker, generally pt is able to be active driving, going to church, brief shopping trips    OT Diagnosis: Generalized weakness;Acute pain   OT Problem List: Decreased  strength;Decreased range of motion;Decreased activity tolerance;Decreased knowledge of use of DME or AE;Pain   OT Treatment/Interventions: Self-care/ADL training;Patient/family education;Therapeutic activities;DME and/or AE instruction    OT Goals(Current goals can be found in the care plan section) Acute Rehab OT Goals Patient Stated Goal: "I'd like to go home" OT Goal Formulation: With patient/family Time For Goal Achievement: 11/18/15 Potential to Achieve Goals: Good ADL Goals Pt Will Perform Lower Body Dressing: with min assist;with adaptive equipment;sit to/from stand (seated with FWW for standing and no LOB) Pt Will Transfer to Toilet: with set-up;stand pivot transfer;bedside commode;with supervision Pt Will Perform Toileting - Clothing Manipulation and hygiene: with supervision;sit to/from stand  OT Frequency: Min 1X/week   Barriers to D/C:            Co-evaluation              End of Session Equipment Utilized During Treatment: Gait belt  Activity Tolerance: Patient limited by pain Patient left: in bed;with call bell/phone within reach;with bed alarm set;with family/visitor present   Time: [id number] OT Time Calculation (min): 36 min Charges:  OT General Charges $OT Visit: 1 Procedure OT Evaluation $OT Eval Low Complexity: 1 Procedure OT Treatments $Self Care/Home Management : 23-37 mins G-Codes:     Chrys Racer, OTR/L ascom [phone] 11/05/15, 10:33 AM

## 2015-11-05 NOTE — Discharge Instructions (Signed)

## 2015-11-05 NOTE — Discharge Summary (Addendum)
Physician Discharge Summary  Patient ID: Elizabeth Trujillo MRN: [id number] DOB/AGE: August 13, 1935 80 y.o.  Admit date: 11/03/2015 Discharge date: 11/06/2015 Admission Diagnoses:  OSTEOARTHRITIS RIGHT KNEE   Discharge Diagnoses: Patient Active Problem List   Diagnosis Date Noted  . S/P total knee arthroplasty 11/03/2015  . Knee pain 09/28/2015  . Myalgia 09/28/2015  . Allergic rhinitis 01/06/2015  . Confusion state 01/06/2015  . Dermatitis, eczematoid 01/06/2015  . Episode of hypertension 01/06/2015  . Hypercholesterolemia 01/06/2015  . Angiopathy 01/06/2015  . Malignant neoplasm of breast ([place]) 01/06/2015  . Adiposity 01/06/2015  . Osteoarthritis 01/06/2015  . Osteopenia 01/06/2015  . Avitaminosis D 01/06/2015    Past Medical History:  Diagnosis Date  . Arthritis   . Cancer ([place]) 1986   left breast  . Elevated triglycerides with high cholesterol   . GERD (gastroesophageal reflux disease)    history of reflux  . Hypercholesterolemia      Transfusion: none   Consultants (if any):   Discharged Condition: Improved  Hospital Course: Elizabeth Trujillo is an 80 y.o. female who was admitted 11/03/2015 with a diagnosis of right knee osteoarthritis and went to the operating room on 11/03/2015 and underwent the above named procedures.    Surgeries: Procedure(s): COMPUTER ASSISTED TOTAL KNEE ARTHROPLASTY on 11/03/2015 Patient tolerated the surgery well. Taken to PACU where she was stabilized and then transferred to the orthopedic floor.  Started on Lovenox 30 q 12 hrs. Foot pumps applied bilaterally at 80 mm. Heels elevated on bed with rolled towels. No evidence of DVT. Negative Homan. Physical therapy started on day #1 for gait training and transfer. OT started day #1 for ADL and assisted devices.  Patient's foley was d/c on day #1. Patient's IV and hemovac was d/c on day #2.  On post op day #3 patient was stable and ready for discharge to skill nursing facility secondary to pt not  meeting requirement to safely go home.  Implants: DePuy Attune size 6N (narrow) posterior stabilized femoral component (cemented), size 5 rotating platform tibial component (cemented), 35 mm medialized dome patella (cemented), and a 5 mm stabilized rotating platform polyethylene insert  She was given perioperative antibiotics:  Anti-infectives    Start     Dose/Rate Route Frequency Ordered Stop   11/03/15 1800  clindamycin (CLEOCIN) IVPB 600 mg     600 mg 100 mL/hr over 30 Minutes Intravenous Every 6 hours 11/03/15 1656 11/04/15 1238   11/03/15 0918  clindamycin (CLEOCIN) 900 MG/50ML IVPB    Comments:  Hallaji, Violet Ann: cabinet override      11/03/15 0918 11/03/15 2129   11/03/15 0255  clindamycin (CLEOCIN) IVPB 900 mg  Status:  Discontinued     900 mg 100 mL/hr over 30 Minutes Intravenous On call to O.R. 11/03/15 0255 11/03/15 1008    .  She was given sequential compression devices, early ambulation, and lovenox for DVT prophylaxis.  She benefited maximally from the hospital stay and there were no complications.    Recent vital signs:  Vitals:   11/04/15 1940 11/05/15 0429  BP: (!) 133/58 (!) 141/63  Pulse: 72 86  Resp: 20 16  Temp: 98 F (36.7 C) 97.8 F (36.6 C)    Recent laboratory studies:  Lab Results  Component Value Date   HGB 11.4 (L) 11/05/2015   HGB 11.0 (L) 11/04/2015   HGB 13.9 11/03/2015   Lab Results  Component Value Date   WBC 14.4 (H) 11/05/2015   PLT 269 11/05/2015  Lab Results  Component Value Date   INR 1.00 10/20/2015   Lab Results  Component Value Date   NA 136 11/05/2015   K 3.5 11/05/2015   CL 104 11/05/2015   CO2 27 11/05/2015   BUN 8 11/05/2015   CREATININE 0.62 11/05/2015   GLUCOSE 135 (H) 11/05/2015    Discharge Medications:     Medication List    TAKE these medications   aspirin 81 MG tablet Take 81 mg by mouth daily.   CALCIUM-VITAMIN D PO Take 2 tablets by mouth daily.   Cholecalciferol 1000 units capsule Take  2,000 Units by mouth at bedtime.   enoxaparin 40 MG/0.4ML injection Commonly known as:  LOVENOX Inject 0.4 mLs (40 mg total) into the skin daily.   fenofibrate 145 MG tablet Commonly known as:  TRICOR Take 1 tablet (145 mg total) by mouth daily.   MULTIPLE VITAMIN PO Take 1 tablet by mouth daily.   OMEGA-3 FATTY ACIDS PO Take 1 capsule by mouth daily.   oxyCODONE 5 MG immediate release tablet Commonly known as:  Oxy IR/ROXICODONE Take 1-2 tablets (5-10 mg total) by mouth every 4 (four) hours as needed for severe pain or breakthrough pain.   potassium chloride SA 20 MEQ tablet Commonly known as:  K-DUR,KLOR-CON Take 1 tablet (20 mEq total) by mouth daily.   rosuvastatin 20 MG tablet Commonly known as:  CRESTOR Take 1 tablet (20 mg total) by mouth daily. What changed:  additional instructions   traMADol 50 MG tablet Commonly known as:  ULTRAM Take 1-2 tablets (50-100 mg total) by mouth every 4 (four) hours as needed for moderate pain.       Diagnostic Studies: Dg Knee Right Port  Result Date: 11/03/2015 CLINICAL DATA:  Post total knee arthroplasty. EXAM: PORTABLE RIGHT KNEE - 1-2 VIEW COMPARISON:  None. FINDINGS: Examination demonstrates evidence of patient's recent right total knee arthroplasty with prosthetic components intact and normally located. Skin staples vertically over the anterior soft tissues. Two surgical drains present over the medial and lateral aspects of the knee joint. IMPRESSION: Expected changes post total knee arthroplasty. Electronically Signed   By: Marin Olp M.D.   On: 11/03/2015 16:02    Disposition:      Contact information for follow-up providers    MUNDY, TODD, PA-C Follow up on 11/17/2015.   Specialty:  Orthopedic Surgery Why:  at 10:15am Contact information: [place] [address] [phone]        Dereck Leep, MD Follow up on 12/16/2015.   Specialty:  Orthopedic Surgery Why:  at 9:45am Contact  information: [address] [facility] [place] [address] [phone]            Contact information for after-discharge care    Destination    [name] .   Specialties:  [place] Contact information: [address] [place] [phone]                   Signed: Feliberto Gottron 11/05/2015, 11:52 AM

## 2015-11-05 NOTE — Care Management Important Message (Signed)
Important Message  Patient Details  Name: Elizabeth Trujillo MRN: [id number] Date of Birth: [birth year]   Medicare Important Message Given:  Yes    Katrina Stack, RN 11/05/2015, 12:29 PM

## 2015-11-05 NOTE — Care Management Obs Status (Deleted)
[facility] NOTIFICATION   Patient Details  Name: MARAL BLAUER MRN: [id number] Date of Birth: April 21, 1935   Medicare Observation Status Notification Given:  Yes    Katrina Stack, RN 11/05/2015, 9:52 AM

## 2015-11-05 NOTE — Progress Notes (Signed)
   Subjective: 2 Days Post-Op Procedure(s) (LRB): COMPUTER ASSISTED TOTAL KNEE ARTHROPLASTY (Right) Patient reports pain as moderate while lying down and severe with movement.    Patient is well, and has had no acute complaints or problems Denies any CP, SOB, ABD pain. We will continue therapy today.    Objective: Vital signs in last 24 hours: Temp:  [97.7 F (36.5 C)-98 F (36.7 C)] 97.8 F (36.6 C) (08/25 0429) Pulse Rate:  [67-86] 86 (08/25 0429) Resp:  [16-20] 16 (08/25 0429) BP: (105-141)/(43-63) 141/63 (08/25 0429) SpO2:  [96 %-99 %] 99 % (08/25 0429)  Intake/Output from previous day: 08/24 0701 - 08/25 0700 In: 1062 [P.O.:480; I.V.:582] Out: 560 [Urine:300; Drains:260] Intake/Output this shift: No intake/output data recorded.   Recent Labs  11/03/15 1100 11/04/15 0429 11/05/15 0532  HGB 13.9 11.0* 11.4*    Recent Labs  11/04/15 0429 11/05/15 0532  WBC 13.5* 14.4*  RBC 3.49* 3.67*  HCT 31.4* 32.7*  PLT 264 269    Recent Labs  11/04/15 0429 11/05/15 0532  NA 137 136  K 3.8 3.5  CL 107 104  CO2 25 27  BUN 10 8  CREATININE 0.68 0.62  GLUCOSE 157* 135*  CALCIUM 8.3* 8.3*   No results for input(s): LABPT, INR in the last 72 hours.  EXAM General - Patient is Alert, Appropriate and Oriented Extremity - Neurovascular intact Sensation intact distally Intact pulses distally Dorsiflexion/Plantar flexion intact No cellulitis present Compartment soft Dressing - dressing C/D/I and hemovac dc.  Motor Function - intact, moving foot and toes well on exam.   Past Medical History:  Diagnosis Date  . Arthritis   . Cancer ([place]) 1986   left breast  . Elevated triglycerides with high cholesterol   . GERD (gastroesophageal reflux disease)    history of reflux  . Hypercholesterolemia     Assessment/Plan:   2 Days Post-Op Procedure(s) (LRB): COMPUTER ASSISTED TOTAL KNEE ARTHROPLASTY (Right) Active Problems:   S/P total knee  arthroplasty   Estimated body mass index is 35.25 kg/m as calculated from the following:   Height as of this encounter: 5\' 3"  (1.6 m).   Weight as of this encounter: 90.3 kg (199 lb). Advance diet Up with therapy  Needs bowel movement Care management to assist with discharge, likely SNF tomorrow depending on progress with PT Acute post op blood loss anemia- Hgb stable, trending up Continue with current pain regimen       DVT Prophylaxis - Lovenox, Foot Pumps and TED hose Weight-Bearing as tolerated to right leg   T. Rachelle Hora, PA-C [place] 11/05/2015, 8:15 AM

## 2015-11-05 NOTE — Progress Notes (Signed)
Plan is for patient to D/C to [facility] tomorrow pending medical clearance. Per admissions coordinator at [facility] patient will go to room E-13. Clinical Education officer, museum (CSW) sent D/C orders to [employer] via [place] today. Patient and her husband are aware of above. CSW will continue to follow and assist as needed.   [employer], LCSW [phone]

## 2015-11-05 NOTE — Care Management (Signed)
patient has now decided to pursue skilled nursing short term placement.  Notified Tim with [facility]

## 2015-11-05 NOTE — Progress Notes (Signed)
Patient and her husband have changed their minds and want to go to [facility]. Per Maudie Mercury admissions coordinator at [facility] they will have a private room available for patient tomorrow after 2 pm. Per Maudie Mercury patient will go to room 212. RN will call report at [phone]. Clinical Education officer, museum (CSW) sent D/C orders to [employer] via [employer] today. CSW will continue to follow and assist as needed.   [employer], LCSW [phone]

## 2015-11-06 DIAGNOSIS — Z471 Aftercare following joint replacement surgery: Secondary | ICD-10-CM | POA: Diagnosis not present

## 2015-11-06 DIAGNOSIS — K5900 Constipation, unspecified: Secondary | ICD-10-CM | POA: Diagnosis not present

## 2015-11-06 DIAGNOSIS — K649 Unspecified hemorrhoids: Secondary | ICD-10-CM | POA: Diagnosis not present

## 2015-11-06 DIAGNOSIS — Z7401 Bed confinement status: Secondary | ICD-10-CM | POA: Diagnosis not present

## 2015-11-06 DIAGNOSIS — N390 Urinary tract infection, site not specified: Secondary | ICD-10-CM | POA: Diagnosis not present

## 2015-11-06 DIAGNOSIS — M25561 Pain in right knee: Secondary | ICD-10-CM | POA: Diagnosis not present

## 2015-11-06 DIAGNOSIS — E7800 Pure hypercholesterolemia, unspecified: Secondary | ICD-10-CM | POA: Diagnosis not present

## 2015-11-06 DIAGNOSIS — E784 Other hyperlipidemia: Secondary | ICD-10-CM | POA: Diagnosis not present

## 2015-11-06 DIAGNOSIS — K219 Gastro-esophageal reflux disease without esophagitis: Secondary | ICD-10-CM | POA: Diagnosis not present

## 2015-11-06 DIAGNOSIS — M6281 Muscle weakness (generalized): Secondary | ICD-10-CM | POA: Diagnosis not present

## 2015-11-06 DIAGNOSIS — M159 Polyosteoarthritis, unspecified: Secondary | ICD-10-CM | POA: Diagnosis not present

## 2015-11-06 DIAGNOSIS — Z96651 Presence of right artificial knee joint: Secondary | ICD-10-CM | POA: Diagnosis not present

## 2015-11-06 DIAGNOSIS — E559 Vitamin D deficiency, unspecified: Secondary | ICD-10-CM | POA: Diagnosis not present

## 2015-11-06 DIAGNOSIS — R262 Difficulty in walking, not elsewhere classified: Secondary | ICD-10-CM | POA: Diagnosis not present

## 2015-11-06 LAB — BASIC METABOLIC PANEL
Anion gap: 5 (ref 5–15)
BUN: 9 mg/dL (ref 6–20)
CHLORIDE: 105 mmol/L (ref 101–111)
CO2: 27 mmol/L (ref 22–32)
CREATININE: 0.59 mg/dL (ref 0.44–1.00)
Calcium: 8.1 mg/dL — ABNORMAL LOW (ref 8.9–10.3)
GFR calc Af Amer: >60 mL/min (ref >60)
GFR calc non Af Amer: >60 mL/min (ref >60)
GLUCOSE: 104 mg/dL — AB (ref 65–99)
Potassium: 3.6 mmol/L (ref 3.5–5.1)
Sodium: 137 mmol/L (ref 135–145)

## 2015-11-06 NOTE — Progress Notes (Signed)
Called [facility] earlier to give report, knowing that the patient's room wouldn't be ready until 2pm. I was put on hold for at least 10 min. I called many times closer to 2pm but the phone only rang. Charge nurse called EMS at 2pm to pick up patient. IV removed and patient's belongings were packed. Husband here at the time EMS picked up patient.

## 2015-11-06 NOTE — Progress Notes (Signed)
Physical Therapy Treatment Patient Details Name: Elizabeth Trujillo MRN: [id number] DOB: Apr 05, 1935 Today's Date: 11/06/2015    History of Present Illness 80 y/o female here with R TKA 8/23    PT Comments    Pt shows good effort with PT session, still pain self-limiting and needing steady encouragement, but ultimately she does show gradual increases with ROM, strength, mobility and gait.  She lacks confidence with most acts but overall is safe with guarding and cuing.  Pt will benefit from STR on d/cfrom hospital.  Follow Up Recommendations  SNF     Equipment Recommendations       Recommendations for Other Services       Precautions / Restrictions Precautions Precautions: Fall Restrictions RLE Weight Bearing: Weight bearing as tolerated    Mobility  Bed Mobility               General bed mobility comments: Pt in recliner on arrival  Transfers Overall transfer level: Modified independent Equipment used: Rolling walker (2 wheeled) Transfers: Sit to/from Stand Sit to Stand: Min guard;Min assist         General transfer comment: Pt again able to rise with heavy cuing/encouragement but ultimately did not need direct assist.  Ambulation/Gait Ambulation/Gait assistance: Min assist Ambulation Distance (Feet): 80 Feet Assistive device: Rolling walker (2 wheeled)       General Gait Details: Pt initially hesitant and slow, did not seem to "warm up" as quickly today and complained of soreness/stiffness with guarded slow gait.  With cuing pt was able to increase speed and confidence.   Stairs            Wheelchair Mobility    Modified Rankin (Stroke Patients Only)       Balance                                    Cognition Arousal/Alertness: Awake/alert Behavior During Therapy: WFL for tasks assessed/performed Overall Cognitive Status: Within Functional Limits for tasks assessed                      Exercises Total Joint  Exercises Ankle Circles/Pumps: AROM;10 reps Quad Sets: Strengthening;15 reps Gluteal Sets: 15 reps;Strengthening Short Arc Quad: AROM;Strengthening;10 reps Hip ABduction/ADduction: 15 reps;Strengthening Straight Leg Raises: AAROM;10 reps Long Arc Quad: AAROM;5 reps Knee Flexion: PROM;5 reps Goniometric ROM: 0-77    General Comments        Pertinent Vitals/Pain Pain Score: 6  Pain Location: R knee    Home Living                      Prior Function            PT Goals (current goals can now be found in the care plan section) Progress towards PT goals: Progressing toward goals    Frequency  BID    PT Plan Current plan remains appropriate    Co-evaluation             End of Session Equipment Utilized During Treatment: Gait belt Activity Tolerance: Patient limited by pain;Patient limited by fatigue Patient left: with chair alarm set;with call bell/phone within reach     Time: 0951-1033 PT Time Calculation (min) (ACUTE ONLY): 42 min  Charges:  $Gait Training: 8-22 mins $Therapeutic Exercise: 23-37 mins  G Codes:      Kreg Shropshire, DPT  11/06/2015, 12:47 PM

## 2015-11-06 NOTE — Clinical Social Work Note (Signed)
Patient to dc to [facility] 212. Patient, her spouse and facility aware. Patient to transport via non-emergent EMS. CSW will be available for any additional dc needs.  Santiago Bumpers, MSW, LCSW-A [phone]

## 2015-11-06 NOTE — Progress Notes (Signed)
Subjective :Patient is Post OP day # 3 Patient reports pain as mild this morning at a level 4 no nausea and no vomiting Denies any cp or sob Resting better Up to [facility] today   Objective: Vital signs in last 24 hours: Temp:  [98.1 F (36.7 C)-98.5 F (36.9 C)] 98.3 F (36.8 C) (08/26 0412) Pulse Rate:  [84-99] 95 (08/26 0412) Resp:  [18-20] 18 (08/26 0412) BP: (117-134)/(55-67) 117/55 (08/26 0412) SpO2:  [95 %-98 %] 95 % (08/26 0412) Wound check clean, dry and no drainage no sensory deficits noted {   Intake/Output from previous day: 08/25 0701 - 08/26 0700 In: 240 [P.O.:240] Out: 550 [Urine:550] Intake/Output this shift: No intake/output data recorded.   Recent Labs  11/03/15 1100 11/04/15 0429 11/05/15 0532  HGB 13.9 11.0* 11.4*    Recent Labs  11/04/15 0429 11/05/15 0532  WBC 13.5* 14.4*  RBC 3.49* 3.67*  HCT 31.4* 32.7*  PLT 264 269    Recent Labs  11/05/15 0532 11/06/15 0343  NA 136 137  K 3.5 3.6  CL 104 105  CO2 27 27  BUN 8 9  CREATININE 0.62 0.59  GLUCOSE 135* 104*  CALCIUM 8.3* 8.1*   No results for input(s): LABPT, INR in the last 72 hours.  Neurologically intact ABD soft Neurovascular intact Sensation intact distally Intact pulses distally Dorsiflexion/Plantar flexion intact  Assessment/Plan: Change dressing today  Case management to assist with discharge planning Physical therapy today Bowel movement today Plan to discharge to STR today after 2:00   WOLFE,JON R. 11/06/2015, 7:27 AM

## 2015-11-06 NOTE — Clinical Social Work Placement (Signed)
   CLINICAL SOCIAL WORK PLACEMENT  NOTE  Date:  11/06/2015  Patient Details  Name: Elizabeth Trujillo MRN: [id number] Date of Birth: [birth year]  Clinical Social Work is seeking post-discharge placement for this patient at the [place] level of care (*CSW will initial, date and re-position this form in  chart as items are completed):  Yes   Patient/family provided with [place] Work Department's list of facilities offering this level of care within the geographic area requested by the patient (or if unable, by the patient's family).  Yes   Patient/family informed of their freedom to choose among providers that offer the needed level of care, that participate in Medicare, Medicaid or managed care program needed by the patient, have an available bed and are willing to accept the patient.  Yes   Patient/family informed of [facility]'s ownership interest in [facility] and [facility], as well as of the fact that they are under no obligation to receive care at these facilities.  PASRR submitted to EDS on 11/04/15     PASRR number received on 11/04/15     Existing PASRR number confirmed on       FL2 transmitted to all facilities in geographic area requested by pt/family on 11/04/15     FL2 transmitted to all facilities within larger geographic area on       Patient informed that his/her managed care company has contracts with or will negotiate with certain facilities, including the following:        Yes   Patient/family informed of bed offers received.  Patient chooses bed at  [facility])     Physician recommends and patient chooses bed at  [facility])    Patient to be transferred to  [facility]) on 11/06/15.  Patient to be transferred to facility by  [facility] non-emergent EMS)     Patient family notified on 11/06/15 of transfer.  Name of family member notified:  Colin Ina, spouse     PHYSICIAN       Additional Comment:     _______________________________________________ Zettie Pho, LCSW 11/06/2015, 10:38 AM

## 2015-11-08 DIAGNOSIS — K5900 Constipation, unspecified: Secondary | ICD-10-CM | POA: Diagnosis not present

## 2015-11-08 DIAGNOSIS — E784 Other hyperlipidemia: Secondary | ICD-10-CM | POA: Diagnosis not present

## 2015-11-08 DIAGNOSIS — M159 Polyosteoarthritis, unspecified: Secondary | ICD-10-CM | POA: Diagnosis not present

## 2015-11-08 LAB — URINE CULTURE: Culture: 10000 — AB

## 2015-11-12 ENCOUNTER — Encounter: Payer: Self-pay | Admitting: Family Medicine

## 2015-11-12 ENCOUNTER — Encounter
Admission: RE | Admit: 2015-11-12 | Discharge: 2015-11-12 | Disposition: A | Discharge status: Home / Self Care | Payer: Medicare Other | Source: Ambulatory Visit | Attending: Internal Medicine | Admitting: Internal Medicine

## 2015-11-16 ENCOUNTER — Non-Acute Institutional Stay (SKILLED_NURSING_FACILITY): Payer: Medicare Other | Admitting: Gerontology

## 2015-11-16 DIAGNOSIS — N390 Urinary tract infection, site not specified: Secondary | ICD-10-CM | POA: Diagnosis not present

## 2015-11-16 DIAGNOSIS — K649 Unspecified hemorrhoids: Secondary | ICD-10-CM

## 2015-11-21 DIAGNOSIS — N390 Urinary tract infection, site not specified: Secondary | ICD-10-CM | POA: Insufficient documentation

## 2015-11-21 NOTE — Progress Notes (Signed)
Location:      Place of Service:  SNF (31) Provider:  Toni Arthurs, NP-C  [name], MD  Patient Care Team: [name]., MD as PCP - [facility])  Extended Emergency Contact Information Primary Emergency Contact: Clydene Laming Address: [address]          [place], [place] [address] Johnnette Litter of [place] Phone: [phone] Mobile Phone: [phone] Relation: Spouse Secondary Emergency Contact: [place] of [place] Phone: [phone] Relation: Daughter  Code Status:  full Goals of care: Advanced Directive information Advanced Directives 11/03/2015  Does patient have an advance directive? Yes  Type of Paramedic of [place];Living will  Does patient want to make changes to advanced directive? No - Patient declined  Copy of advanced directive(s) in chart? No - copy requested     Chief Complaint  Patient presents with  . Acute Visit    HPI:  Pt is a 80 y.o. female seen today for an acute visit for UTI and hemorrhoids. Pt reports she has a long h/o hemorrhoids. Her husband brought her home meds for her to use. OK for her to continue these and ok to keep at bedside for use as needed for hemorrhoids. Pt also had a UA obtained prior to discharge from the hospital. Results were faxed to the facility. She is positive for Proteus Mirabilis. Pt reports she typically goes to the restroom frequently ss she has no new reports of frequency or urgency. She denies n/v/d/f/c/cp/ sob ha/abd pain/dizziness, etc. No s/s of sepsis. VSS. No other complaints.     Past Medical History:  Diagnosis Date  . Arthritis   . Cancer ([place]) 1986   left breast  . Elevated triglycerides with high cholesterol   . GERD (gastroesophageal reflux disease)    history of reflux  . Hypercholesterolemia    Past Surgical History:  Procedure Laterality Date  . ABDOMINAL HYSTERECTOMY  [id number]   TAH/BSO  . BREAST  SURGERY Left 07/1984   Mastectomy  . CATARACT EXTRACTION W/ INTRAOCULAR LENS IMPLANT Left 2015  . EYE SURGERY Left 2013  . FOOT SURGERY Left 04/2013  . KNEE ARTHROPLASTY Right 11/03/2015   Procedure: COMPUTER ASSISTED TOTAL KNEE ARTHROPLASTY;  Surgeon: Dereck Leep, MD;  Location: [facility] ORS;  Service: Orthopedics;  Laterality: Right;    Allergies  Allergen Reactions  . Penicillin V   . Penicillin V Potassium Hives    Has patient had a PCN reaction causing immediate rash, facial/tongue/throat swelling, SOB or lightheadedness with hypotension: no Has patient had a PCN reaction causing severe rash involving mucus membranes or skin necrosis: {no Has patient had a PCN reaction that required hospitalization no Has patient had a PCN reaction occurring within the last 10 years: no If all of the above answers are "NO", then may proceed with Cephalosporin use.  . Sulfa Antibiotics Hives      Medication List       Accurate as of 11/16/15 11:59 PM. Always use your most recent med list.          aspirin 81 MG tablet Take 81 mg by mouth daily.   CALCIUM-VITAMIN D PO Take 2 tablets by mouth daily.   Cholecalciferol 1000 units capsule Take 2,000 Units by mouth at bedtime.   enoxaparin 40 MG/0.4ML injection Commonly known as:  LOVENOX Inject 0.4 mLs (40 mg total) into the skin daily.   fenofibrate 145 MG tablet Commonly known as:  TRICOR Take  1 tablet (145 mg total) by mouth daily.   MULTIPLE VITAMIN PO Take 1 tablet by mouth daily.   OMEGA-3 FATTY ACIDS PO Take 1 capsule by mouth daily.   oxyCODONE 5 MG immediate release tablet Commonly known as:  Oxy IR/ROXICODONE Take 1-2 tablets (5-10 mg total) by mouth every 4 (four) hours as needed for severe pain or breakthrough pain.   potassium chloride SA 20 MEQ tablet Commonly known as:  K-DUR,KLOR-CON Take 1 tablet (20 mEq total) by mouth daily.   rosuvastatin 20 MG tablet Commonly known as:  CRESTOR Take 1 tablet (20 mg total)  by mouth daily.   traMADol 50 MG tablet Commonly known as:  ULTRAM Take 1-2 tablets (50-100 mg total) by mouth every 4 (four) hours as needed for moderate pain.       Review of Systems  Constitutional: Negative for activity change, appetite change, chills, diaphoresis and fever.  HENT: Negative for congestion, sneezing, sore throat, trouble swallowing and voice change.   Eyes: Negative for pain, redness and visual disturbance.  Respiratory: Negative for apnea, cough, choking, chest tightness, shortness of breath and wheezing.   Cardiovascular: Negative for chest pain, palpitations and leg swelling.  Gastrointestinal: Negative for abdominal distention, abdominal pain, constipation, diarrhea and nausea.  Genitourinary: Negative for difficulty urinating, dysuria, frequency and urgency.  Musculoskeletal: Negative for back pain, gait problem and myalgias. Arthralgias: typical arthritis.  Skin: Negative for color change, pallor, rash and wound.  Neurological: Negative for dizziness, tremors, syncope, speech difficulty, weakness, numbness and headaches.  Psychiatric/Behavioral: Negative for agitation and behavioral problems.  All other systems reviewed and are negative.   Immunization History  Administered Date(s) Administered  . Influenza, High Dose Seasonal PF 01/14/2015  . Pneumococcal Conjugate-13 09/08/2013  . Pneumococcal Polysaccharide-23 01/04/2011   Pertinent  Health Maintenance Due  Topic Date Due  . INFLUENZA VACCINE  10/12/2015  . DEXA SCAN  Completed  . PNA vac Low Risk Adult  Completed   Fall Risk  01/14/2015 01/14/2015  Falls in the past year? No No   Functional Status Survey:    There were no vitals filed for this visit. There is no height or weight on file to calculate BMI. Physical Exam  Constitutional: She is oriented to person, place, and time. Vital signs are normal. She appears well-developed and well-nourished. She is active and cooperative. She does not  appear ill. No distress.  HENT:  Head: Normocephalic and atraumatic.  Mouth/Throat: Uvula is midline, oropharynx is clear and moist and mucous membranes are normal. Mucous membranes are not pale, not dry and not cyanotic.  Eyes: Conjunctivae, EOM and lids are normal. Pupils are equal, round, and reactive to light.  Neck: Trachea normal, normal range of motion and full passive range of motion without pain. Neck supple. No JVD present. No tracheal deviation, no edema and no erythema present. No thyromegaly present.  Cardiovascular: Normal rate, regular rhythm, normal heart sounds, intact distal pulses and normal pulses.  Exam reveals no gallop, no distant heart sounds and no friction rub.   No murmur heard. Pulmonary/Chest: Effort normal and breath sounds normal. No accessory muscle usage. No respiratory distress. She has no wheezes. She has no rales. She exhibits no tenderness.  Abdominal: Normal appearance and bowel sounds are normal. She exhibits no distension and no ascites. There is no tenderness.  Musculoskeletal: She exhibits no edema.       Right knee: She exhibits decreased range of motion (R TKA). Tenderness found.  Expected osteoarthritis,  stiffness  Neurological: She is alert and oriented to person, place, and time. She has normal strength.  Skin: Skin is warm and dry. Laceration (incision- RTKA) noted. No rash noted. She is not diaphoretic. No cyanosis or erythema. No pallor. Nails show no clubbing.  Psychiatric: She has a normal mood and affect. Her speech is normal and behavior is normal. Judgment and thought content normal. Cognition and memory are normal.  Nursing note and vitals reviewed.   Labs reviewed:  Recent Labs  11/04/15 0429 11/05/15 0532 11/06/15 0343  NA 137 136 137  K 3.8 3.5 3.6  CL 107 104 105  CO2 25 27 27   GLUCOSE 157* 135* 104*  BUN 10 8 9   CREATININE 0.68 0.62 0.59  CALCIUM 8.3* 8.3* 8.1*    Recent Labs  04/12/15 1500 09/28/15 1627  10/20/15 1407  AST 25 19 23   ALT 25 19 20   ALKPHOS 59 64 65  BILITOT 0.4 0.4 0.4  PROT 6.7 6.4 7.5  ALBUMIN 4.1 3.9 3.9    Recent Labs  04/12/15 1500  10/20/15 1407 11/03/15 1100 11/04/15 0429 11/05/15 0532  WBC 9.8  --  10.7  --  13.5* 14.4*  NEUTROABS 5.9  --   --   --   --   --   HGB  --   < > 14.3 13.9 11.0* 11.4*  HCT 41.9  < > 41.6 41.0 31.4* 32.7*  MCV 90  --  89.9  --  89.8 89.3  PLT 387*  --  318  --  264 269  < > = values in this interval not displayed. Lab Results  Component Value Date   TSH 2.120 04/12/2015   No results found for: HGBA1C Lab Results  Component Value Date   CHOL 190 04/12/2015   HDL 52 04/12/2015   LDLCALC 101 (H) 04/12/2015   TRIG 183 (H) 04/12/2015    Significant Diagnostic Results in last 30 days:  Dg Knee Right Port  Result Date: 11/03/2015 CLINICAL DATA:  Post total knee arthroplasty. EXAM: PORTABLE RIGHT KNEE - 1-2 VIEW COMPARISON:  None. FINDINGS: Examination demonstrates evidence of patient's recent right total knee arthroplasty with prosthetic components intact and normally located. Skin staples vertically over the anterior soft tissues. Two surgical drains present over the medial and lateral aspects of the knee joint. IMPRESSION: Expected changes post total knee arthroplasty. Electronically Signed   By: Marin Olp M.D.   On: 11/03/2015 16:02    Assessment/Plan 1. Urinary tract infection, site not specified  Cipro 250 mg po Q 12 hours x 5 days  2. Hemorrhoids  Clobetasol 0.5% cream- thin film to clean, dry hemorrhoids TID prn- pt's home med  Generic hemorrhoid cream- thin film QID prn- pt's home med  Family/ staff Communication:   Total Time:  Documentation:  Face to Face:  Family/Phone:   Labs/tests ordered:    Medication list reviewed and assessed for continued appropriateness.  Vikki Ports, NP-C [facility] [phone], [place] [address] Cell  Phone (Mon-Fri 8am-5pm):  [phone] On Call:  [phone] & follow prompts after 5pm & weekends Office Phone:  [phone] Office Fax:  [phone]

## 2015-11-23 ENCOUNTER — Non-Acute Institutional Stay (SKILLED_NURSING_FACILITY): Payer: Medicare Other | Admitting: Gerontology

## 2015-11-23 DIAGNOSIS — N390 Urinary tract infection, site not specified: Secondary | ICD-10-CM

## 2015-11-23 DIAGNOSIS — Z96651 Presence of right artificial knee joint: Secondary | ICD-10-CM | POA: Diagnosis not present

## 2015-11-23 LAB — URINALYSIS COMPLETE WITH MICROSCOPIC (ARMC ONLY)
BACTERIA UA: NONE SEEN
BILIRUBIN URINE: NEGATIVE
GLUCOSE, UA: NEGATIVE mg/dL
HGB URINE DIPSTICK: NEGATIVE
Ketones, ur: NEGATIVE mg/dL
LEUKOCYTES UA: NEGATIVE
NITRITE: NEGATIVE
PH: 7 (ref 5.0–8.0)
Protein, ur: NEGATIVE mg/dL
SPECIFIC GRAVITY, URINE: 1.011 (ref 1.005–1.030)

## 2015-11-24 LAB — URINE CULTURE: Culture: NO GROWTH

## 2015-11-24 NOTE — Progress Notes (Signed)
Location:      Place of Service:  SNF (31)  Provider: Toni Arthurs, NP-C  PCP: [name], MD Patient Care Team: [name]., MD as PCP - [facility])  Extended Emergency Contact Information Primary Emergency Contact: Clydene Laming Address: [address]          [place], [place] [address] Johnnette Litter of [place] Phone: [phone] Mobile Phone: [phone] Relation: Spouse Secondary Emergency Contact: [place] of [place] Phone: [phone] Relation: Daughter  Code Status: Full Goals of care:  Advanced Directive information Advanced Directives 11/03/2015  Does patient have an advance directive? Yes  Type of Paramedic of [place];Living will  Does patient want to make changes to advanced directive? No - Patient declined  Copy of advanced directive(s) in chart? No - copy requested     Allergies  Allergen Reactions  . Penicillin V   . Penicillin V Potassium Hives    Has patient had a PCN reaction causing immediate rash, facial/tongue/throat swelling, SOB or lightheadedness with hypotension: no Has patient had a PCN reaction causing severe rash involving mucus membranes or skin necrosis: {no Has patient had a PCN reaction that required hospitalization no Has patient had a PCN reaction occurring within the last 10 years: no If all of the above answers are "NO", then may proceed with Cephalosporin use.  Ignacia Bayley Antibiotics Hives    Chief Complaint  Patient presents with  . Discharge Note    HPI:  80 y.o. female was at the facility for rehabilitation following stay at [facility] status post right total knee replacement. Urine was checked for a UTI prior to discharge from the hospital but had not received results. When results are available they were faxed to [place]. She was treated successfully for UTI. Patient wanted urine rechecked prior to her discharge from here to ensure UTI was gone.  Patient participated and progressed well with physical therapy area and she was walking the halls independently prior to discharge. Pain was at a minimum. Incision CDI. All questions answered, no concerns. Vital signs stable. Patient requested her pain medication prescription be given to her husband had a time can pick it up from the pharmacy and have her ready when she gets home.      Past Medical History:  Diagnosis Date  . Arthritis   . Cancer ([place]) 1986   left breast  . Elevated triglycerides with high cholesterol   . GERD (gastroesophageal reflux disease)    history of reflux  . Hypercholesterolemia     Past Surgical History:  Procedure Laterality Date  . ABDOMINAL HYSTERECTOMY  [id number]   TAH/BSO  . BREAST SURGERY Left 07/1984   Mastectomy  . CATARACT EXTRACTION W/ INTRAOCULAR LENS IMPLANT Left 2015  . EYE SURGERY Left 2013  . FOOT SURGERY Left 04/2013  . KNEE ARTHROPLASTY Right 11/03/2015   Procedure: COMPUTER ASSISTED TOTAL KNEE ARTHROPLASTY;  Surgeon: Dereck Leep, MD;  Location: [facility] ORS;  Service: Orthopedics;  Laterality: Right;      reports that she has never smoked. She has never used smokeless tobacco. She reports that she does not drink alcohol or use drugs. Social History   Social History  . Marital status: Married    Spouse name: N/A  . Number of children: N/A  . Years of education: N/A   Occupational History  . Not on file.   Social History Main Topics  . Smoking status: Never Smoker  . Smokeless  tobacco: Never Used  . Alcohol use No  . Drug use: No  . Sexual activity: Not on file   Other Topics Concern  . Not on file   Social History Narrative  . No narrative on file   Functional Status Survey:    Allergies  Allergen Reactions  . Penicillin V   . Penicillin V Potassium Hives    Has patient had a PCN reaction causing immediate rash, facial/tongue/throat swelling, SOB or lightheadedness with hypotension: no Has patient had a PCN reaction  causing severe rash involving mucus membranes or skin necrosis: {no Has patient had a PCN reaction that required hospitalization no Has patient had a PCN reaction occurring within the last 10 years: no If all of the above answers are "NO", then may proceed with Cephalosporin use.  Ignacia Bayley Antibiotics Hives    Pertinent  Health Maintenance Due  Topic Date Due  . INFLUENZA VACCINE  10/12/2015  . DEXA SCAN  Completed  . PNA vac Low Risk Adult  Completed    Medications:   Medication List       Accurate as of 11/23/15 11:59 PM. Always use your most recent med list.          aspirin 81 MG tablet Take 81 mg by mouth daily.   CALCIUM-VITAMIN D PO Take 2 tablets by mouth daily.   Cholecalciferol 1000 units capsule Take 2,000 Units by mouth at bedtime.   enoxaparin 40 MG/0.4ML injection Commonly known as:  LOVENOX Inject 0.4 mLs (40 mg total) into the skin daily.   fenofibrate 145 MG tablet Commonly known as:  TRICOR Take 1 tablet (145 mg total) by mouth daily.   MULTIPLE VITAMIN PO Take 1 tablet by mouth daily.   OMEGA-3 FATTY ACIDS PO Take 1 capsule by mouth daily.   oxyCODONE 5 MG immediate release tablet Commonly known as:  Oxy IR/ROXICODONE Take 1-2 tablets (5-10 mg total) by mouth every 4 (four) hours as needed for severe pain or breakthrough pain.   rosuvastatin 20 MG tablet Commonly known as:  CRESTOR Take 1 tablet (20 mg total) by mouth daily.   traMADol 50 MG tablet Commonly known as:  ULTRAM Take 1-2 tablets (50-100 mg total) by mouth every 4 (four) hours as needed for moderate pain.       Review of Systems  Constitutional: Negative for activity change, appetite change, chills, diaphoresis and fever.  HENT: Negative for congestion, sneezing, sore throat, trouble swallowing and voice change.   Eyes: Negative for pain, redness and visual disturbance.  Respiratory: Negative for apnea, cough, choking, chest tightness, shortness of breath and wheezing.     Cardiovascular: Negative for chest pain, palpitations and leg swelling.  Gastrointestinal: Negative for abdominal distention, abdominal pain, constipation, diarrhea and nausea.  Genitourinary: Negative for difficulty urinating, dysuria, frequency and urgency.  Musculoskeletal: Positive for arthralgias (typical arthritis). Negative for back pain, gait problem and myalgias.  Skin: Positive for wound (Right knee incision). Negative for color change, pallor and rash.  Neurological: Negative for dizziness, tremors, syncope, speech difficulty, weakness, numbness and headaches.  Psychiatric/Behavioral: Negative for agitation and behavioral problems.  All other systems reviewed and are negative.   There were no vitals filed for this visit. There is no height or weight on file to calculate BMI. Physical Exam  Constitutional: She is oriented to person, place, and time. Vital signs are normal. She appears well-developed and well-nourished. She is active and cooperative. She does not appear ill. No distress.  HENT:  Head: Normocephalic and atraumatic.  Mouth/Throat: Uvula is midline, oropharynx is clear and moist and mucous membranes are normal. Mucous membranes are not pale, not dry and not cyanotic.  Eyes: Conjunctivae, EOM and lids are normal. Pupils are equal, round, and reactive to light.  Neck: Trachea normal, normal range of motion and full passive range of motion without pain. Neck supple. No JVD present. No tracheal deviation, no edema and no erythema present. No thyromegaly present.  Cardiovascular: Normal rate, regular rhythm, normal heart sounds, intact distal pulses and normal pulses.  Exam reveals no gallop and no distant heart sounds.   Pulmonary/Chest: Effort normal and breath sounds normal. No accessory muscle usage. No respiratory distress. She has no wheezes. She has no rales. She exhibits no tenderness.  Abdominal: Normal appearance and bowel sounds are normal. She exhibits no  distension and no ascites. There is no tenderness.  Musculoskeletal: She exhibits no edema or tenderness.       Right knee: She exhibits decreased range of motion and laceration (incision- CDI, well approximated).  Expected osteoarthritis, stiffness; bilateral calves soft, supple. Negative Homans sign. Bilateral 2+ pedal pulses  Neurological: She is alert and oriented to person, place, and time. She has normal strength.  Skin: Skin is warm, dry and intact. She is not diaphoretic. No cyanosis. No pallor. Nails show no clubbing.  Psychiatric: She has a normal mood and affect. Her speech is normal and behavior is normal. Judgment and thought content normal. Cognition and memory are normal.  Nursing note and vitals reviewed.   Labs reviewed: Basic Metabolic Panel:  Recent Labs  11/04/15 0429 11/05/15 0532 11/06/15 0343  NA 137 136 137  K 3.8 3.5 3.6  CL 107 104 105  CO2 25 27 27   GLUCOSE 157* 135* 104*  BUN 10 8 9   CREATININE 0.68 0.62 0.59  CALCIUM 8.3* 8.3* 8.1*   Liver Function Tests:  Recent Labs  04/12/15 1500 09/28/15 1627 10/20/15 1407  AST 25 19 23   ALT 25 19 20   ALKPHOS 59 64 65  BILITOT 0.4 0.4 0.4  PROT 6.7 6.4 7.5  ALBUMIN 4.1 3.9 3.9   No results for input(s): LIPASE, AMYLASE in the last 8760 hours. No results for input(s): AMMONIA in the last 8760 hours. CBC:  Recent Labs  04/12/15 1500  10/20/15 1407 11/03/15 1100 11/04/15 0429 11/05/15 0532  WBC 9.8  --  10.7  --  13.5* 14.4*  NEUTROABS 5.9  --   --   --   --   --   HGB  --   < > 14.3 13.9 11.0* 11.4*  HCT 41.9  < > 41.6 41.0 31.4* 32.7*  MCV 90  --  89.9  --  89.8 89.3  PLT 387*  --  318  --  264 269  < > = values in this interval not displayed. Cardiac Enzymes:  Recent Labs  09/28/15 1627  CKTOTAL 95   BNP: Invalid input(s): POCBNP CBG: No results for input(s): GLUCAP in the last 8760 hours.  Procedures and Imaging Studies During Stay: Dg Knee Right Port  Result Date:  11/03/2015 CLINICAL DATA:  Post total knee arthroplasty. EXAM: PORTABLE RIGHT KNEE - 1-2 VIEW COMPARISON:  None. FINDINGS: Examination demonstrates evidence of patient's recent right total knee arthroplasty with prosthetic components intact and normally located. Skin staples vertically over the anterior soft tissues. Two surgical drains present over the medial and lateral aspects of the knee joint. IMPRESSION: Expected changes post total knee arthroplasty. Electronically  Signed   By: Marin Olp M.D.   On: 11/03/2015 16:02    Assessment/Plan:   1. Urinary tract infection, site not specified  Urine sample was re-collected prior to discharge. Patient requested a repeat UA to ensure UTI was gone.  UA and culture were negative  2. Status post total right knee replacement  Aspirin 325 MG by mouth daily for DVT prophylaxis  Patient was on subcutaneous Lovenox while here  Continue home exercises per home health PT/OT  Follow-up with orthopedist as instructed  Prescription written for oxycodone 5 mg 1-2 tablets by mouth every 4 hours when necessary pain #15, no refills    Patient is being discharged with the following home health services:  Outpatient PT  Patient is being discharged with the following durable medical equipment:  None needed  Patient has been advised to f/u with their PCP in 1-2 weeks to bring them up to date on their rehab stay.  Social services at facility was responsible for arranging this appointment.  Pt was provided with a 30 day supply of prescriptions for medications and refills must be obtained from their PCP.  For controlled substances, a more limited supply may be provided adequate until PCP appointment only.  Future labs/tests needed:  None- per pcp  Family/ staff Communication:   Total Time:  Documentation:  Face to Face:  Family/Phone:  Vikki Ports, NP-C [facility] [address], [place]  [id number] Cell Phone (Mon-Fri 8am-5pm):  [phone] On Call:  [phone] & follow prompts after 5pm & weekends Office Phone:  [phone] Office Fax:  [phone]

## 2015-11-25 DIAGNOSIS — Z96651 Presence of right artificial knee joint: Secondary | ICD-10-CM | POA: Diagnosis not present

## 2015-11-25 DIAGNOSIS — M6281 Muscle weakness (generalized): Secondary | ICD-10-CM | POA: Diagnosis not present

## 2015-11-25 DIAGNOSIS — M25661 Stiffness of right knee, not elsewhere classified: Secondary | ICD-10-CM | POA: Diagnosis not present

## 2015-11-25 DIAGNOSIS — M25561 Pain in right knee: Secondary | ICD-10-CM | POA: Diagnosis not present

## 2015-11-26 DIAGNOSIS — M6281 Muscle weakness (generalized): Secondary | ICD-10-CM | POA: Diagnosis not present

## 2015-11-26 DIAGNOSIS — M25661 Stiffness of right knee, not elsewhere classified: Secondary | ICD-10-CM | POA: Diagnosis not present

## 2015-11-26 DIAGNOSIS — M25561 Pain in right knee: Secondary | ICD-10-CM | POA: Diagnosis not present

## 2015-11-26 DIAGNOSIS — Z96651 Presence of right artificial knee joint: Secondary | ICD-10-CM | POA: Diagnosis not present

## 2015-11-29 DIAGNOSIS — Z96651 Presence of right artificial knee joint: Secondary | ICD-10-CM | POA: Diagnosis not present

## 2015-11-29 DIAGNOSIS — M25561 Pain in right knee: Secondary | ICD-10-CM | POA: Diagnosis not present

## 2015-11-29 DIAGNOSIS — M6281 Muscle weakness (generalized): Secondary | ICD-10-CM | POA: Diagnosis not present

## 2015-11-29 DIAGNOSIS — M25661 Stiffness of right knee, not elsewhere classified: Secondary | ICD-10-CM | POA: Diagnosis not present

## 2015-12-01 DIAGNOSIS — M25561 Pain in right knee: Secondary | ICD-10-CM | POA: Diagnosis not present

## 2015-12-01 DIAGNOSIS — M25661 Stiffness of right knee, not elsewhere classified: Secondary | ICD-10-CM | POA: Diagnosis not present

## 2015-12-01 DIAGNOSIS — M6281 Muscle weakness (generalized): Secondary | ICD-10-CM | POA: Diagnosis not present

## 2015-12-01 DIAGNOSIS — Z96651 Presence of right artificial knee joint: Secondary | ICD-10-CM | POA: Diagnosis not present

## 2015-12-03 DIAGNOSIS — M6281 Muscle weakness (generalized): Secondary | ICD-10-CM | POA: Diagnosis not present

## 2015-12-03 DIAGNOSIS — M25661 Stiffness of right knee, not elsewhere classified: Secondary | ICD-10-CM | POA: Diagnosis not present

## 2015-12-03 DIAGNOSIS — M25561 Pain in right knee: Secondary | ICD-10-CM | POA: Diagnosis not present

## 2015-12-03 DIAGNOSIS — Z96651 Presence of right artificial knee joint: Secondary | ICD-10-CM | POA: Diagnosis not present

## 2015-12-06 DIAGNOSIS — M25561 Pain in right knee: Secondary | ICD-10-CM | POA: Diagnosis not present

## 2015-12-06 DIAGNOSIS — M25661 Stiffness of right knee, not elsewhere classified: Secondary | ICD-10-CM | POA: Diagnosis not present

## 2015-12-06 DIAGNOSIS — Z96651 Presence of right artificial knee joint: Secondary | ICD-10-CM | POA: Diagnosis not present

## 2015-12-06 DIAGNOSIS — M6281 Muscle weakness (generalized): Secondary | ICD-10-CM | POA: Diagnosis not present

## 2015-12-08 DIAGNOSIS — Z96651 Presence of right artificial knee joint: Secondary | ICD-10-CM | POA: Diagnosis not present

## 2015-12-08 DIAGNOSIS — M6281 Muscle weakness (generalized): Secondary | ICD-10-CM | POA: Diagnosis not present

## 2015-12-08 DIAGNOSIS — M25561 Pain in right knee: Secondary | ICD-10-CM | POA: Diagnosis not present

## 2015-12-08 DIAGNOSIS — M25661 Stiffness of right knee, not elsewhere classified: Secondary | ICD-10-CM | POA: Diagnosis not present

## 2015-12-10 DIAGNOSIS — M25561 Pain in right knee: Secondary | ICD-10-CM | POA: Diagnosis not present

## 2015-12-10 DIAGNOSIS — M25661 Stiffness of right knee, not elsewhere classified: Secondary | ICD-10-CM | POA: Diagnosis not present

## 2015-12-10 DIAGNOSIS — M6281 Muscle weakness (generalized): Secondary | ICD-10-CM | POA: Diagnosis not present

## 2015-12-10 DIAGNOSIS — Z96651 Presence of right artificial knee joint: Secondary | ICD-10-CM | POA: Diagnosis not present

## 2015-12-13 DIAGNOSIS — M25661 Stiffness of right knee, not elsewhere classified: Secondary | ICD-10-CM | POA: Diagnosis not present

## 2015-12-13 DIAGNOSIS — Z96651 Presence of right artificial knee joint: Secondary | ICD-10-CM | POA: Diagnosis not present

## 2015-12-13 DIAGNOSIS — M25561 Pain in right knee: Secondary | ICD-10-CM | POA: Diagnosis not present

## 2015-12-13 DIAGNOSIS — M6281 Muscle weakness (generalized): Secondary | ICD-10-CM | POA: Diagnosis not present

## 2015-12-15 DIAGNOSIS — M25561 Pain in right knee: Secondary | ICD-10-CM | POA: Diagnosis not present

## 2015-12-15 DIAGNOSIS — M25661 Stiffness of right knee, not elsewhere classified: Secondary | ICD-10-CM | POA: Diagnosis not present

## 2015-12-15 DIAGNOSIS — Z96651 Presence of right artificial knee joint: Secondary | ICD-10-CM | POA: Diagnosis not present

## 2015-12-15 DIAGNOSIS — M6281 Muscle weakness (generalized): Secondary | ICD-10-CM | POA: Diagnosis not present

## 2015-12-16 DIAGNOSIS — Z96651 Presence of right artificial knee joint: Secondary | ICD-10-CM | POA: Diagnosis not present

## 2015-12-17 DIAGNOSIS — M6281 Muscle weakness (generalized): Secondary | ICD-10-CM | POA: Diagnosis not present

## 2015-12-17 DIAGNOSIS — M25661 Stiffness of right knee, not elsewhere classified: Secondary | ICD-10-CM | POA: Diagnosis not present

## 2015-12-17 DIAGNOSIS — Z96651 Presence of right artificial knee joint: Secondary | ICD-10-CM | POA: Diagnosis not present

## 2015-12-17 DIAGNOSIS — M25561 Pain in right knee: Secondary | ICD-10-CM | POA: Diagnosis not present

## 2015-12-20 DIAGNOSIS — M25661 Stiffness of right knee, not elsewhere classified: Secondary | ICD-10-CM | POA: Diagnosis not present

## 2015-12-20 DIAGNOSIS — M25561 Pain in right knee: Secondary | ICD-10-CM | POA: Diagnosis not present

## 2015-12-20 DIAGNOSIS — Z96651 Presence of right artificial knee joint: Secondary | ICD-10-CM | POA: Diagnosis not present

## 2015-12-20 DIAGNOSIS — M6281 Muscle weakness (generalized): Secondary | ICD-10-CM | POA: Diagnosis not present

## 2015-12-22 DIAGNOSIS — M25561 Pain in right knee: Secondary | ICD-10-CM | POA: Diagnosis not present

## 2015-12-22 DIAGNOSIS — Z96651 Presence of right artificial knee joint: Secondary | ICD-10-CM | POA: Diagnosis not present

## 2015-12-22 DIAGNOSIS — M25661 Stiffness of right knee, not elsewhere classified: Secondary | ICD-10-CM | POA: Diagnosis not present

## 2015-12-22 DIAGNOSIS — M6281 Muscle weakness (generalized): Secondary | ICD-10-CM | POA: Diagnosis not present

## 2015-12-24 DIAGNOSIS — M25561 Pain in right knee: Secondary | ICD-10-CM | POA: Diagnosis not present

## 2015-12-24 DIAGNOSIS — Z96651 Presence of right artificial knee joint: Secondary | ICD-10-CM | POA: Diagnosis not present

## 2015-12-24 DIAGNOSIS — M25661 Stiffness of right knee, not elsewhere classified: Secondary | ICD-10-CM | POA: Diagnosis not present

## 2015-12-27 DIAGNOSIS — M25561 Pain in right knee: Secondary | ICD-10-CM | POA: Diagnosis not present

## 2015-12-27 DIAGNOSIS — M25661 Stiffness of right knee, not elsewhere classified: Secondary | ICD-10-CM | POA: Diagnosis not present

## 2015-12-27 DIAGNOSIS — Z96651 Presence of right artificial knee joint: Secondary | ICD-10-CM | POA: Diagnosis not present

## 2015-12-29 DIAGNOSIS — Z96651 Presence of right artificial knee joint: Secondary | ICD-10-CM | POA: Diagnosis not present

## 2015-12-29 DIAGNOSIS — M25661 Stiffness of right knee, not elsewhere classified: Secondary | ICD-10-CM | POA: Diagnosis not present

## 2015-12-29 DIAGNOSIS — M6281 Muscle weakness (generalized): Secondary | ICD-10-CM | POA: Diagnosis not present

## 2015-12-29 DIAGNOSIS — M25561 Pain in right knee: Secondary | ICD-10-CM | POA: Diagnosis not present

## 2016-01-04 DIAGNOSIS — Z96651 Presence of right artificial knee joint: Secondary | ICD-10-CM | POA: Diagnosis not present

## 2016-01-04 DIAGNOSIS — M6281 Muscle weakness (generalized): Secondary | ICD-10-CM | POA: Diagnosis not present

## 2016-01-04 DIAGNOSIS — M25561 Pain in right knee: Secondary | ICD-10-CM | POA: Diagnosis not present

## 2016-01-04 DIAGNOSIS — M25661 Stiffness of right knee, not elsewhere classified: Secondary | ICD-10-CM | POA: Diagnosis not present

## 2016-01-06 ENCOUNTER — Encounter (INDEPENDENT_AMBULATORY_CARE_PROVIDER_SITE_OTHER): Payer: Self-pay

## 2016-01-06 ENCOUNTER — Encounter: Payer: Self-pay | Admitting: Family Medicine

## 2016-01-06 ENCOUNTER — Ambulatory Visit (INDEPENDENT_AMBULATORY_CARE_PROVIDER_SITE_OTHER): Payer: Medicare Other | Admitting: Family Medicine

## 2016-01-06 VITALS — BP 122/64 | HR 80 | Temp 98.1°F | Resp 16 | Wt 200.0 lb

## 2016-01-06 DIAGNOSIS — N3090 Cystitis, unspecified without hematuria: Secondary | ICD-10-CM | POA: Diagnosis not present

## 2016-01-06 DIAGNOSIS — Z13820 Encounter for screening for osteoporosis: Secondary | ICD-10-CM | POA: Diagnosis not present

## 2016-01-06 DIAGNOSIS — E2839 Other primary ovarian failure: Secondary | ICD-10-CM | POA: Diagnosis not present

## 2016-01-06 DIAGNOSIS — R5383 Other fatigue: Secondary | ICD-10-CM | POA: Insufficient documentation

## 2016-01-06 DIAGNOSIS — K648 Other hemorrhoids: Secondary | ICD-10-CM | POA: Diagnosis not present

## 2016-01-06 DIAGNOSIS — Z780 Asymptomatic menopausal state: Secondary | ICD-10-CM | POA: Diagnosis not present

## 2016-01-06 LAB — POCT URINALYSIS DIPSTICK
BILIRUBIN UA: NEGATIVE
Blood, UA: NEGATIVE
GLUCOSE UA: NEGATIVE
KETONES UA: NEGATIVE
Nitrite, UA: NEGATIVE
Protein, UA: NEGATIVE
Spec Grav, UA: 1.01
Urobilinogen, UA: 0.2
pH, UA: 8

## 2016-01-06 MED ORDER — HYDROCORTISONE ACETATE 25 MG RE SUPP: 25.0000 mg | Freq: Every day | RECTAL | 3 refills | Status: DC

## 2016-01-06 NOTE — Progress Notes (Signed)
[name] Female    DOB: 1935/11/24   80 y.o.   MRN: [id number] Visit Date: 01/06/2016  Today's Provider: [name], MD   Chief Complaint  Patient presents with  . Fatigue   Subjective:    HPI   Fatigue Pt had right TKR on 11/03/2015. Pt is concerned because she has noticed more fatigue than normal. Does report she had UTI in hospital, and is concerned that the fatigue may be secondary to UTI. Also is experiencing hemorrhoids (was constipated after surgery). Pt also wants to know if she should stay on Klor Con 20 mg, and if so, will need refill.  Allergies  Allergen Reactions  . Penicillin V   . Penicillin V Potassium Hives    Has patient had a PCN reaction causing immediate rash, facial/tongue/throat swelling, SOB or lightheadedness with hypotension: no Has patient had a PCN reaction causing severe rash involving mucus membranes or skin necrosis: {no Has patient had a PCN reaction that required hospitalization no Has patient had a PCN reaction occurring within the last 10 years: no If all of the above answers are "NO", then may proceed with Cephalosporin use.  . Sulfa Antibiotics Hives     Current Outpatient Prescriptions:  .  acetaminophen (TYLENOL) 500 MG tablet, Take 500 mg by mouth every 6 (six) hours as needed., Disp: , Rfl:  .  aspirin 81 MG tablet, Take 81 mg by mouth daily. , Disp: , Rfl:  .  CALCIUM-VITAMIN D PO, Take 2 tablets by mouth daily. , Disp: , Rfl:  .  Cholecalciferol 1000 UNITS capsule, Take 2,000 Units by mouth at bedtime. , Disp: , Rfl:  .  fenofibrate (TRICOR) 145 MG tablet, Take 1 tablet (145 mg total) by mouth daily., Disp: 90 tablet, Rfl: 3 .  MULTIPLE VITAMIN PO, Take 1 tablet by mouth daily. , Disp: , Rfl:  .  OMEGA-3 FATTY ACIDS PO, Take 1 capsule by mouth daily. , Disp: , Rfl:  .  rosuvastatin (CRESTOR) 20 MG tablet, Take 1 tablet (20 mg total) by mouth daily. (Patient taking differently: Take 20 mg by mouth daily.  Takes 0.5 tablets every evening.), Disp: 90 tablet, Rfl: 3 .  traMADol (ULTRAM) 50 MG tablet, Take 1-2 tablets (50-100 mg total) by mouth every 4 (four) hours as needed for moderate pain., Disp: 60 tablet, Rfl: 0 .  potassium chloride SA (K-DUR,KLOR-CON) 20 MEQ tablet, Take 1 tablet (20 mEq total) by mouth daily., Disp: 20 tablet, Rfl: 0  Review of Systems  Constitutional: Positive for fatigue. Negative for chills, diaphoresis and fever.  Eyes: Negative.   Respiratory: Negative for shortness of breath.   Cardiovascular: Negative for chest pain.  Gastrointestinal: Negative for abdominal pain.  Endocrine: Negative.   Genitourinary: Negative for difficulty urinating, dysuria, flank pain and frequency.  Musculoskeletal: Positive for arthralgias.  Allergic/Immunologic: Negative.   Psychiatric/Behavioral: Negative.     Social History  Substance Use Topics  . Smoking status: Never Smoker  . Smokeless tobacco: Never Used  . Alcohol use No   Objective:   BP 122/64 (BP Location: Left Arm, Patient Position: Sitting, Cuff Size: Large)   Pulse 80   Temp 98.1 F (36.7 C) (Oral)   Resp 16   Wt 200 lb (90.7 kg)   BMI 35.43 kg/m   Physical Exam  Constitutional: She appears well-developed and well-nourished.  HENT:  Head: Normocephalic and atraumatic.  Right Ear: External ear normal.  Left Ear: External  ear normal.  Nose: Nose normal.  Eyes: Conjunctivae are normal. Pupils are equal, round, and reactive to light.  Neck: Normal range of motion. Neck supple. No thyromegaly present.  Cardiovascular: Normal rate, regular rhythm and normal heart sounds.   Pulmonary/Chest: Effort normal and breath sounds normal. No respiratory distress.  Abdominal: Soft. There is no tenderness.  Genitourinary: Rectal exam shows internal hemorrhoid.  Genitourinary Comments: There is an edge of this Small protruding internal hemorrhoid. It is not thrombosed.  Musculoskeletal: She exhibits edema (1 + ankle  edema).  Lymphadenopathy:    She has no cervical adenopathy.  Skin: Skin is warm and dry.  Psychiatric: She has a normal mood and affect. Her behavior is normal. Judgment and thought content normal.        Assessment & Plan:     1. Other fatigue Check labs. FU pending results. - CBC with Differential/Platelet - Comprehensive metabolic panel - TSH  2. Cystitis Send for cx. Will treat pending results and how pt feels. - POCT urinalysis dipstick - Urine culture Results for orders placed or performed in visit on 01/06/16  POCT urinalysis dipstick  Result Value Ref Range   Color, UA yellow    Clarity, UA cloudy    Glucose, UA Negative    Bilirubin, UA Negative    Ketones, UA Negative    Spec Grav, UA 1.010    Blood, UA Negative    pH, UA 8.0    Protein, UA Negative    Urobilinogen, UA 0.2    Nitrite, UA Negative    Leukocytes, UA small (1+) (A) Negative     3. Internal hemorrhoid Treat as below. - hydrocortisone (ANUSOL-HC) 25 MG suppository; Place 1 suppository (25 mg total) rectally at bedtime.  Dispense: 12 suppository; Refill: 3  4.  OA/status post TKR are on the right    Patient seen and examined by Miguel Aschoff, MD, and note scribed by Renaldo Fiddler, CMA.  I have done the exam and reviewed the above chart and it is accurate to the best of my knowledge.  Nyzier Boivin Cranford Mon, MD  [facility]

## 2016-01-07 LAB — COMPREHENSIVE METABOLIC PANEL
A/G RATIO: 1.4 (ref 1.2–2.2)
ALBUMIN: 4.2 g/dL (ref 3.5–4.8)
ALT: 14 IU/L (ref 0–32)
AST: 14 IU/L (ref 0–40)
Alkaline Phosphatase: 72 IU/L (ref 39–117)
BILIRUBIN TOTAL: 0.2 mg/dL (ref 0.0–1.2)
BUN / CREAT RATIO: 20 (ref 12–28)
BUN: 13 mg/dL (ref 8–27)
CALCIUM: 9.9 mg/dL (ref 8.7–10.3)
CO2: 25 mmol/L (ref 18–29)
Chloride: 104 mmol/L (ref 96–106)
Creatinine, Ser: 0.64 mg/dL (ref 0.57–1.00)
GFR, EST AFRICAN AMERICAN: 99 mL/min/{1.73_m2} (ref >59)
GFR, EST NON AFRICAN AMERICAN: 86 mL/min/{1.73_m2} (ref >59)
Globulin, Total: 2.9 g/dL (ref 1.5–4.5)
Glucose: 91 mg/dL (ref 65–99)
POTASSIUM: 4.2 mmol/L (ref 3.5–5.2)
Sodium: 144 mmol/L (ref 134–144)
TOTAL PROTEIN: 7.1 g/dL (ref 6.0–8.5)

## 2016-01-07 LAB — CBC WITH DIFFERENTIAL/PLATELET
BASOS: 0 %
Basophils Absolute: 0 10*3/uL (ref 0.0–0.2)
EOS (ABSOLUTE): 0.1 10*3/uL (ref 0.0–0.4)
EOS: 1 %
HEMATOCRIT: 41.2 % (ref 34.0–46.6)
HEMOGLOBIN: 13.4 g/dL (ref 11.1–15.9)
IMMATURE GRANS (ABS): 0 10*3/uL (ref 0.0–0.1)
IMMATURE GRANULOCYTES: 0 %
LYMPHS: 20 %
Lymphocytes Absolute: 2.1 10*3/uL (ref 0.7–3.1)
MCH: 28.5 pg (ref 26.6–33.0)
MCHC: 32.5 g/dL (ref 31.5–35.7)
MCV: 88 fL (ref 79–97)
MONOCYTES: 9 %
Monocytes Absolute: 0.9 10*3/uL (ref 0.1–0.9)
NEUTROS ABS: 7.5 10*3/uL — AB (ref 1.4–7.0)
NEUTROS PCT: 70 %
PLATELETS: 383 10*3/uL — AB (ref 150–379)
RBC: 4.7 x10E6/uL (ref 3.77–5.28)
RDW: 14.7 % (ref 12.3–15.4)
WBC: 10.6 10*3/uL (ref 3.4–10.8)

## 2016-01-07 LAB — TSH: TSH: 1.85 u[IU]/mL (ref 0.450–4.500)

## 2016-01-08 LAB — URINE CULTURE

## 2016-01-11 ENCOUNTER — Other Ambulatory Visit: Payer: Self-pay | Admitting: Emergency Medicine

## 2016-01-11 ENCOUNTER — Telehealth: Payer: Self-pay | Admitting: Family Medicine

## 2016-01-11 NOTE — Telephone Encounter (Signed)
Pt called wanting to talk to [place].  She said she had spoken with her earlier today and needs to talk with her again before she [place]) talk to Dr. Rosanna Randy.  Pt call back is [phone]  Thanks Con Memos

## 2016-01-11 NOTE — Telephone Encounter (Signed)
See lab message 

## 2016-01-12 ENCOUNTER — Other Ambulatory Visit: Payer: Self-pay | Admitting: Emergency Medicine

## 2016-01-12 DIAGNOSIS — N390 Urinary tract infection, site not specified: Secondary | ICD-10-CM

## 2016-01-12 DIAGNOSIS — M5441 Lumbago with sciatica, right side: Secondary | ICD-10-CM | POA: Diagnosis not present

## 2016-01-12 MED ORDER — CIPROFLOXACIN HCL 250 MG PO TABS: 250.0000 mg | ORAL_TABLET | Freq: Two times a day (BID) | ORAL | 0 refills | Status: DC

## 2016-01-18 ENCOUNTER — Encounter: Payer: Self-pay | Admitting: Family Medicine

## 2016-01-25 DIAGNOSIS — M6281 Muscle weakness (generalized): Secondary | ICD-10-CM | POA: Diagnosis not present

## 2016-01-25 DIAGNOSIS — M25661 Stiffness of right knee, not elsewhere classified: Secondary | ICD-10-CM | POA: Diagnosis not present

## 2016-01-25 DIAGNOSIS — M25561 Pain in right knee: Secondary | ICD-10-CM | POA: Diagnosis not present

## 2016-01-25 DIAGNOSIS — Z96651 Presence of right artificial knee joint: Secondary | ICD-10-CM | POA: Diagnosis not present

## 2016-02-15 ENCOUNTER — Encounter: Payer: Medicare Other | Admitting: Family Medicine

## 2016-02-23 DIAGNOSIS — Z23 Encounter for immunization: Secondary | ICD-10-CM | POA: Diagnosis not present

## 2016-04-04 ENCOUNTER — Ambulatory Visit (INDEPENDENT_AMBULATORY_CARE_PROVIDER_SITE_OTHER): Payer: Medicare Other | Admitting: Family Medicine

## 2016-04-04 ENCOUNTER — Ambulatory Visit: Payer: Medicare Other

## 2016-04-04 VITALS — BP 124/68 | HR 76 | Temp 97.8°F | Ht 63.0 in | Wt 206.6 lb

## 2016-04-04 VITALS — BP 124/68 | HR 76 | Temp 97.8°F | Wt 206.0 lb

## 2016-04-04 DIAGNOSIS — Z170 Estrogen receptor positive status [ER+]: Secondary | ICD-10-CM | POA: Diagnosis not present

## 2016-04-04 DIAGNOSIS — C50912 Malignant neoplasm of unspecified site of left female breast: Secondary | ICD-10-CM

## 2016-04-04 DIAGNOSIS — Z0000 Encounter for general adult medical examination without abnormal findings: Secondary | ICD-10-CM

## 2016-04-04 DIAGNOSIS — Z853 Personal history of malignant neoplasm of breast: Secondary | ICD-10-CM

## 2016-04-04 DIAGNOSIS — I10 Essential (primary) hypertension: Secondary | ICD-10-CM | POA: Diagnosis not present

## 2016-04-04 DIAGNOSIS — M170 Bilateral primary osteoarthritis of knee: Secondary | ICD-10-CM

## 2016-04-04 DIAGNOSIS — E7800 Pure hypercholesterolemia, unspecified: Secondary | ICD-10-CM | POA: Diagnosis not present

## 2016-04-04 MED ORDER — FENOFIBRATE 145 MG PO TABS: 145.0000 mg | ORAL_TABLET | Freq: Every day | ORAL | 3 refills | Status: DC

## 2016-04-04 MED ORDER — ROSUVASTATIN CALCIUM 20 MG PO TABS: 20.0000 mg | ORAL_TABLET | Freq: Every day | ORAL | 3 refills | Status: DC

## 2016-04-04 NOTE — Progress Notes (Signed)
[name], Female    DOB: 1935-06-27, 81 y.o.   MRN: [id number] Visit Date: 04/04/2016  Today's Provider: [name], MD   Chief Complaint  Patient presents with  . Annual Exam   Subjective:   Elizabeth Trujillo is a 81 y.o. female who presents today for her Subsequent Annual Wellness Visit. She feels well. She reports exercising none. She reports she is sleeping well.  Immunization History  Administered Date(s) Administered  . Influenza, High Dose Seasonal PF 01/14/2015  . Pneumococcal Conjugate-13 09/08/2013  . Pneumococcal Polysaccharide-23 01/04/2011   03/24/08 Pap-normal 01/06/16 BMD-normal 03/24/2014 Mamm-normal 09/30/2014 Colonoscopy-diverticulosis, otherwise normal  Review of Systems  Constitutional: Negative.   HENT: Positive for hearing loss.   Eyes: Negative.   Respiratory: Negative.   Cardiovascular: Negative.   Gastrointestinal: Negative.   Endocrine: Negative.   Genitourinary: Negative.   Musculoskeletal: Positive for arthralgias.       Chronic left knee pain  Skin: Negative.   Allergic/Immunologic: Negative.   Neurological: Negative.   Hematological: Negative.   Psychiatric/Behavioral: Negative.     Patient Active Problem List   Diagnosis Date Noted  . Fatigue 01/06/2016  . Urinary tract infection, site not specified 11/21/2015  . S/P total knee arthroplasty 11/03/2015  . Knee pain 09/28/2015  . Myalgia 09/28/2015  . Allergic rhinitis 01/06/2015  . Confusion state 01/06/2015  . Dermatitis, eczematoid 01/06/2015  . Episode of hypertension 01/06/2015  . Hypercholesterolemia 01/06/2015  . Angiopathy 01/06/2015  . Malignant neoplasm of breast ([place]) 01/06/2015  . Adiposity 01/06/2015  . Osteoarthritis 01/06/2015  . Osteopenia 01/06/2015  . Avitaminosis D 01/06/2015    Social History   Social History  . Marital status: Married    Spouse name: N/A  . Number of children: N/A  . Years of education: N/A   Occupational History   . Not on file.   Social History Main Topics  . Smoking status: Never Smoker  . Smokeless tobacco: Never Used  . Alcohol use No  . Drug use: No  . Sexual activity: Not on file   Other Topics Concern  . Not on file   Social History Narrative  . No narrative on file    Past Surgical History:  Procedure Laterality Date  . ABDOMINAL HYSTERECTOMY  [id number]   TAH/BSO  . BREAST SURGERY Left 07/1984   Mastectomy  . CATARACT EXTRACTION W/ INTRAOCULAR LENS IMPLANT Left 2015  . EYE SURGERY Left 2013  . FOOT SURGERY Left 04/2013  . KNEE ARTHROPLASTY Right 11/03/2015   Procedure: COMPUTER ASSISTED TOTAL KNEE ARTHROPLASTY;  Surgeon: Dereck Leep, MD;  Location: [facility] ORS;  Service: Orthopedics;  Laterality: Right;    Her family history includes Arthritis in her mother; Cancer in her brother and mother; Glaucoma in her mother; Heart disease in her father; Kidney disease in her mother; Stroke in her mother.     Outpatient Encounter Prescriptions as of 04/04/2016  Medication Sig  . acetaminophen (TYLENOL) 500 MG tablet Take 500 mg by mouth every 6 (six) hours as needed.  Marland Kitchen aspirin 81 MG tablet Take 81 mg by mouth daily.   Marland Kitchen CALCIUM-VITAMIN D PO Take 2 tablets by mouth daily.   . Cholecalciferol 1000 UNITS capsule Take 1,000 Units by mouth at bedtime.   . ciprofloxacin (CIPRO) 250 MG tablet Take 1 tablet (250 mg total) by mouth 2 (two) times daily.  . fenofibrate (TRICOR) 145 MG tablet Take 1 tablet (145 mg total) by mouth daily.  . hydrocortisone (  ANUSOL-HC) 25 MG suppository Place 1 suppository (25 mg total) rectally at bedtime.  . MULTIPLE VITAMIN PO Take 1 tablet by mouth daily.   . OMEGA-3 FATTY ACIDS PO Take 1 capsule by mouth daily.   . rosuvastatin (CRESTOR) 10 MG tablet Take 10 mg by mouth daily.  . rosuvastatin (CRESTOR) 20 MG tablet Take 1 tablet (20 mg total) by mouth daily.  . traMADol (ULTRAM) 50 MG tablet Take 1-2 tablets (50-100 mg total) by mouth every 4 (four) hours as needed  for moderate pain.  . potassium chloride SA (K-DUR,KLOR-CON) 20 MEQ tablet Take 1 tablet (20 mEq total) by mouth daily.   No facility-administered encounter medications on file as of 04/04/2016.     Allergies  Allergen Reactions  . Penicillin V   . Penicillin V Potassium Hives    Has patient had a PCN reaction causing immediate rash, facial/tongue/throat swelling, SOB or lightheadedness with hypotension: no Has patient had a PCN reaction causing severe rash involving mucus membranes or skin necrosis: {no Has patient had a PCN reaction that required hospitalization no Has patient had a PCN reaction occurring within the last 10 years: no If all of the above answers are "NO", then may proceed with Cephalosporin use.  Ignacia Bayley Antibiotics Hives    Patient Care Team: [name]., MD as PCP - General (Family Medicine) Dereck Leep, MD as Referring Physician (Orthopedic Surgery) Birder Robson, MD as Referring Physician (Ophthalmology)   Objective:   Vitals:  Vitals:   04/04/16 1408  BP: 124/68  Pulse: 76  Temp: 97.8 F (36.6 C)  TempSrc: Oral  Weight: 206 lb (93.4 kg)    Physical Exam  Constitutional: She is oriented to person, place, and time. She appears well-developed and well-nourished.  HENT:  Head: Normocephalic and atraumatic.  Right Ear: External ear normal.  Left Ear: External ear normal.  Nose: Nose normal.  Mouth/Throat: Oropharynx is clear and moist.  Eyes: Conjunctivae and EOM are normal. Pupils are equal, round, and reactive to light.  Neck: Normal range of motion. Neck supple.  Cardiovascular: Normal rate, regular rhythm, normal heart sounds and intact distal pulses.   Pulmonary/Chest: Effort normal and breath sounds normal.  No mass in left  mastectomy scar tissue. Right breast normal  Abdominal: Soft. Bowel sounds are normal.  Musculoskeletal: Normal range of motion.  Neurological: She is alert and oriented to person, place, and time.  Skin:  Skin is warm and dry.  Psychiatric: She has a normal mood and affect. Her behavior is normal. Judgment and thought content normal.    Activities of Daily Living In your present state of health, do you have any difficulty performing the following activities: 04/04/2016 11/03/2015  Hearing? Tempie Donning  Vision? Y N  Difficulty concentrating or making decisions? N N  Walking or climbing stairs? Y Y  Dressing or bathing? N N  Doing errands, shopping? N N  Preparing Food and eating ? N -  Using the Toilet? N -  In the past six months, have you accidently leaked urine? N -  Do you have problems with loss of bowel control? N -  Managing your Medications? N -  Managing your Finances? Y -  Housekeeping or managing your Housekeeping? N -  Some recent data might be hidden    Fall Risk Assessment Fall Risk  04/04/2016 01/14/2015 01/14/2015  Falls in the past year? No No No     Depression Screen PHQ 2/9 Scores 04/04/2016 01/14/2015 01/14/2015  PHQ - 2 Score 0 0 0    Cognitive Testing - 6-CIT    Year: 0 4 points  Month: 0 3 points  Memorize "Pia Mau, [address], [place]"  Time (within 1 hour:) 0 3 points  Count backwards from 20: 0 2 4 points  Name months of year: 0 2 4 points  Repeat Address: 0 2 4 6 8 10  points   Total Score: 0//28  Interpretation : Normal (0-7) Abnormal (8-28)    Assessment & Plan:     Annual Wellness Visit  Reviewed patient's Family Medical History Reviewed and updated list of patient's medical providers Assessment of cognitive impairment was done Assessed patient's functional ability Established a written schedule for health screening Reform Completed and Reviewed  Exercise Activities and Dietary recommendations Goals    . Increase water intake          Starting 04/04/16, I will increase my water intake to 4-5 glasses a day.       Immunization History  Administered Date(s) Administered  . Influenza, High Dose Seasonal PF  01/14/2015  . Pneumococcal Conjugate-13 09/08/2013  . Pneumococcal Polysaccharide-23 01/04/2011    Health Maintenance  Topic Date Due  . TETANUS/TDAP  08/11/2016 (Originally 06/06/1954)  . INFLUENZA VACCINE  Completed  . DEXA SCAN  Completed  . ZOSTAVAX  Completed  . PNA vac Low Risk Adult  Completed    Patient declines colonoscopy but is willing to do cologuard Discussed health benefits of physical activity, and encouraged her to engage in regular exercise appropriate for her age and condition.  Probable left  knee osteoarthritis Status post left mastectomy for breast cancer Status post right TKR  I have done the exam and reviewed the chart and it is accurate to the best of my knowledge. Development worker, community has been used and  any errors in dictation or transcription are unintentional. Miguel Aschoff M.D. [facility]

## 2016-04-04 NOTE — Progress Notes (Signed)
Subjective:   Elizabeth Trujillo is a 81 y.o. female who presents for Medicare Annual (Subsequent) preventive examination.  Review of Systems:  N/A  Cardiac Risk Factors include: advanced age (>10men, >72 women);dyslipidemia;hypertension;obesity (BMI >30kg/m2)     Objective:     Vitals: BP 124/68 (BP Location: Right Arm)   Pulse 76   Temp 97.8 F (36.6 C) (Oral)   Ht 5\' 3"  (1.6 m)   Wt 206 lb 9.6 oz (93.7 kg)   BMI 36.60 kg/m   Body mass index is 36.6 kg/m.   Tobacco History  Smoking Status  . Never Smoker  Smokeless Tobacco  . Never Used     Counseling given: Not Answered   Past Medical History:  Diagnosis Date  . Arthritis   . Cancer ([place]) 1986   left breast  . Elevated triglycerides with high cholesterol   . GERD (gastroesophageal reflux disease)    history of reflux  . Hypercholesterolemia   . Hypertension    Past Surgical History:  Procedure Laterality Date  . ABDOMINAL HYSTERECTOMY  [id number]   TAH/BSO  . BREAST SURGERY Left 07/1984   Mastectomy  . CATARACT EXTRACTION W/ INTRAOCULAR LENS IMPLANT Left 2015  . EYE SURGERY Left 2013  . FOOT SURGERY Left 04/2013  . KNEE ARTHROPLASTY Right 11/03/2015   Procedure: COMPUTER ASSISTED TOTAL KNEE ARTHROPLASTY;  Surgeon: Dereck Leep, MD;  Location: [facility] ORS;  Service: Orthopedics;  Laterality: Right;   Family History  Problem Relation Age of Onset  . Cancer Mother     baldder and breast  . Glaucoma Mother   . Kidney disease Mother   . Arthritis Mother   . Stroke Mother   . Heart disease Father   . Cancer Brother     lung cancer, smoker   History  Sexual Activity  . Sexual activity: Not on file    Outpatient Encounter Prescriptions as of 04/04/2016  Medication Sig  . aspirin 81 MG tablet Take 81 mg by mouth daily.   Marland Kitchen CALCIUM-VITAMIN D PO Take 2 tablets by mouth daily.   . Cholecalciferol 1000 UNITS capsule Take 1,000 Units by mouth at bedtime.   . fenofibrate (TRICOR) 145 MG tablet Take 1 tablet  (145 mg total) by mouth daily.  . MULTIPLE VITAMIN PO Take 1 tablet by mouth daily.   . OMEGA-3 FATTY ACIDS PO Take 1 capsule by mouth daily.   . rosuvastatin (CRESTOR) 10 MG tablet Take 10 mg by mouth daily.  Marland Kitchen acetaminophen (TYLENOL) 500 MG tablet Take 500 mg by mouth every 6 (six) hours as needed.  . ciprofloxacin (CIPRO) 250 MG tablet Take 1 tablet (250 mg total) by mouth 2 (two) times daily. (Patient not taking: Reported on 04/04/2016)  . hydrocortisone (ANUSOL-HC) 25 MG suppository Place 1 suppository (25 mg total) rectally at bedtime. (Patient not taking: Reported on 04/04/2016)  . potassium chloride SA (K-DUR,KLOR-CON) 20 MEQ tablet Take 1 tablet (20 mEq total) by mouth daily.  . rosuvastatin (CRESTOR) 20 MG tablet Take 1 tablet (20 mg total) by mouth daily. (Patient not taking: Reported on 04/04/2016)  . traMADol (ULTRAM) 50 MG tablet Take 1-2 tablets (50-100 mg total) by mouth every 4 (four) hours as needed for moderate pain. (Patient not taking: Reported on 04/04/2016)   No facility-administered encounter medications on file as of 04/04/2016.     Activities of Daily Living In your present state of health, do you have any difficulty performing the following activities: 04/04/2016 11/03/2015  Hearing?  Tempie Donning  Vision? Y N  Difficulty concentrating or making decisions? N N  Walking or climbing stairs? Y Y  Dressing or bathing? N N  Doing errands, shopping? N N  Preparing Food and eating ? N -  Using the Toilet? N -  In the past six months, have you accidently leaked urine? N -  Do you have problems with loss of bowel control? N -  Managing your Medications? N -  Managing your Finances? Y -  Housekeeping or managing your Housekeeping? N -  Some recent data might be hidden    Patient Care Team: [name]., MD as PCP - General (Family Medicine) Dereck Leep, MD as Referring Physician (Orthopedic Surgery) Birder Robson, MD as Referring Physician (Ophthalmology)      Assessment:     Exercise Activities and Dietary recommendations Current Exercise Habits: The patient does not participate in regular exercise at present;Home exercise routine, Type of exercise: stretching, Time (Minutes): 10 (-15), Frequency (Times/Week): 5, Weekly Exercise (Minutes/Week): 50, Intensity: Mild  Goals    . Increase water intake          Starting 04/04/16, I will increase my water intake to 4-5 glasses a day.      Fall Risk Fall Risk  04/04/2016 01/14/2015 01/14/2015  Falls in the past year? No No No   Depression Screen PHQ 2/9 Scores 04/04/2016 01/14/2015 01/14/2015  PHQ - 2 Score 0 0 0     Cognitive Function        Immunization History  Administered Date(s) Administered  . Influenza, High Dose Seasonal PF 01/14/2015  . Pneumococcal Conjugate-13 09/08/2013  . Pneumococcal Polysaccharide-23 01/04/2011   Screening Tests Health Maintenance  Topic Date Due  . TETANUS/TDAP  06/06/1956  . ZOSTAVAX  06/06/1997  . INFLUENZA VACCINE  10/12/2015  . DEXA SCAN  Completed  . PNA vac Low Risk Adult  Completed      Plan:  I have personally reviewed and addressed the Medicare Annual Wellness questionnaire and have noted the following in the patient's chart:  A. Medical and social history B. Use of alcohol, tobacco or illicit drugs  C. Current medications and supplements D. Functional ability and status E.  Nutritional status F.  Physical activity G. Advance directives H. List of other physicians I.  Hospitalizations, surgeries, and ER visits in previous 12 months J.  [place] such as hearing and vision if needed, cognitive and depression L. Referrals and appointments - none  In addition, I have reviewed and discussed with patient certain preventive protocols, quality metrics, and best practice recommendations. A written personalized care plan for preventive services as well as general preventive health recommendations were provided to patient.  See  attached scanned questionnaire for additional information.   Signed,  Fabio Neighbors, LPN Nurse Health Advisor   MD Recommendations: Follow up on tetanus after pt checks cost with insurance.  I have done the exam and reviewed the chart and it is accurate to the best of my knowledge. Development worker, community has been used and  any errors in dictation or transcription are unintentional. Miguel Aschoff M.D. [facility]

## 2016-04-04 NOTE — Patient Instructions (Signed)
Health Maintenance, Female Introduction Adopting a healthy lifestyle and getting preventive care can go a long way to promote health and wellness. Talk with your health care provider about what schedule of regular examinations is right for you. This is a good chance for you to check in with your provider about disease prevention and staying healthy. In between checkups, there are plenty of things you can do on your own. Experts have done a lot of research about which lifestyle changes and preventive measures are most likely to keep you healthy. Ask your health care provider for more information. Weight and diet Eat a healthy diet  Be sure to include plenty of vegetables, fruits, low-fat dairy products, and lean protein.  Do not eat a lot of foods high in solid fats, added sugars, or salt.  Get regular exercise. This is one of the most important things you can do for your health.  Most adults should exercise for at least 150 minutes each week. The exercise should increase your heart rate and make you sweat (moderate-intensity exercise).  Most adults should also do strengthening exercises at least twice a week. This is in addition to the moderate-intensity exercise. Maintain a healthy weight  Body mass index (BMI) is a measurement that can be used to identify possible weight problems. It estimates body fat based on height and weight. Your health care provider can help determine your BMI and help you achieve or maintain a healthy weight.  For females 4 years of age and older:  A BMI below 18.5 is considered underweight.  A BMI of 18.5 to 24.9 is normal.  A BMI of 25 to 29.9 is considered overweight.  A BMI of 30 and above is considered obese. Watch levels of cholesterol and blood lipids  You should start having your blood tested for lipids and cholesterol at 79 years of age, then have this test every 5 years.  You may need to have your cholesterol levels checked more often  if:  Your lipid or cholesterol levels are high.  You are older than 79 years of age.  You are at high risk for heart disease. Cancer screening Lung Cancer  Lung cancer screening is recommended for adults 12-31 years old who are at high risk for lung cancer because of a history of smoking.  A yearly low-dose CT scan of the lungs is recommended for people who:  Currently smoke.  Have quit within the past 15 years.  Have at least a 30-pack-year history of smoking. A pack year is smoking an average of one pack of cigarettes a day for 1 year.  Yearly screening should continue until it has been 15 years since you quit.  Yearly screening should stop if you develop a health problem that would prevent you from having lung cancer treatment. Breast Cancer  Practice breast self-awareness. This means understanding how your breasts normally appear and feel.  It also means doing regular breast self-exams. Let your health care provider know about any changes, no matter how small.  If you are in your 20s or 30s, you should have a clinical breast exam (CBE) by a health care provider every 1-3 years as part of a regular health exam.  If you are 74 or older, have a CBE every year. Also consider having a breast X-ray (mammogram) every year.  If you have a family history of breast cancer, talk to your health care provider about genetic screening.  If you are at high risk for breast cancer,  talk to your health care provider about having an MRI and a mammogram every year.  Breast cancer gene (BRCA) assessment is recommended for women who have family members with BRCA-related cancers. BRCA-related cancers include:  Breast.  Ovarian.  Tubal.  Peritoneal cancers.  Results of the assessment will determine the need for genetic counseling and BRCA1 and BRCA2 testing. Colorectal Cancer  This type of cancer can be detected and often prevented.  Routine colorectal cancer screening usually begins  at 79 years of age and continues through 79 years of age.  Your health care provider may recommend screening at an earlier age if you have risk factors for colon cancer.  Your health care provider may also recommend using home test kits to check for hidden blood in the stool.  A small camera at the end of a tube can be used to examine your colon directly (sigmoidoscopy or colonoscopy). This is done to check for the earliest forms of colorectal cancer.  Routine screening usually begins at age 50.  Direct examination of the colon should be repeated every 5-10 years through 79 years of age. However, you may need to be screened more often if early forms of precancerous polyps or small growths are found. Skin Cancer  Check your skin from head to toe regularly.  Tell your health care provider about any new moles or changes in moles, especially if there is a change in a mole's shape or color.  Also tell your health care provider if you have a mole that is larger than the size of a pencil eraser.  Always use sunscreen. Apply sunscreen liberally and repeatedly throughout the day.  Protect yourself by wearing long sleeves, pants, a wide-brimmed hat, and sunglasses whenever you are outside. Heart disease, diabetes, and high blood pressure  High blood pressure causes heart disease and increases the risk of stroke. High blood pressure is more likely to develop in:  People who have blood pressure in the high end of the normal range (130-139/85-89 mm Hg).  People who are overweight or obese.  People who are African American.  If you are 18-39 years of age, have your blood pressure checked every 3-5 years. If you are 40 years of age or older, have your blood pressure checked every year. You should have your blood pressure measured twice-once when you are at a hospital or clinic, and once when you are not at a hospital or clinic. Record the average of the two measurements. To check your blood pressure  when you are not at a hospital or clinic, you can use:  An automated blood pressure machine at a pharmacy.  A home blood pressure monitor.  If you are between 55 years and 79 years old, ask your health care provider if you should take aspirin to prevent strokes.  Have regular diabetes screenings. This involves taking a blood sample to check your fasting blood sugar level.  If you are at a normal weight and have a low risk for diabetes, have this test once every three years after 79 years of age.  If you are overweight and have a high risk for diabetes, consider being tested at a younger age or more often. Preventing infection Hepatitis B  If you have a higher risk for hepatitis B, you should be screened for this virus. You are considered at high risk for hepatitis B if:  You were born in a country where hepatitis B is common. Ask your health care provider which countries are   considered high risk.  Your parents were born in a high-risk country, and you have not been immunized against hepatitis B (hepatitis B vaccine).  You have HIV or AIDS.  You use needles to inject street drugs.  You live with someone who has hepatitis B.  You have had sex with someone who has hepatitis B.  You get hemodialysis treatment.  You take certain medicines for conditions, including cancer, organ transplantation, and autoimmune conditions. Hepatitis C  Blood testing is recommended for:  Everyone born from 1945 through 1965.  Anyone with known risk factors for hepatitis C. Osteoporosis and menopause  Osteoporosis is a disease in which the bones lose minerals and strength with aging. This can result in serious bone fractures. Your risk for osteoporosis can be identified using a bone density scan.  If you are 65 years of age or older, or if you are at risk for osteoporosis and fractures, ask your health care provider if you should be screened.  Ask your health care provider whether you should take  a calcium or vitamin D supplement to lower your risk for osteoporosis.  Menopause may have certain physical symptoms and risks.  Hormone replacement therapy may reduce some of these symptoms and risks. Talk to your health care provider about whether hormone replacement therapy is right for you. Follow these instructions at home:  Schedule regular health, dental, and eye exams.  Stay current with your immunizations.  Do not use any tobacco products including cigarettes, chewing tobacco, or electronic cigarettes.  If you are pregnant, do not drink alcohol.  If you are breastfeeding, limit how much and how often you drink alcohol.  Limit alcohol intake to no more than 1 drink per day for nonpregnant women. One drink equals 12 ounces of beer, 5 ounces of wine, or 1 ounces of hard liquor.  Do not use street drugs.  Do not share needles.  Ask your health care provider for help if you need support or information about quitting drugs.  Tell your health care provider if you often feel depressed.  Tell your health care provider if you have ever been abused or do not feel safe at home. This information is not intended to replace advice given to you by your health care provider. Make sure you discuss any questions you have with your health care provider. Document Released: 09/12/2010 Document Revised: 08/05/2015 Document Reviewed: 12/01/2014  2017 Elsevier  

## 2016-04-05 LAB — COMPREHENSIVE METABOLIC PANEL
ALT: 17 IU/L (ref 0–32)
AST: 20 IU/L (ref 0–40)
Albumin/Globulin Ratio: 1.9 (ref 1.2–2.2)
Albumin: 4.5 g/dL (ref 3.5–4.8)
Alkaline Phosphatase: 64 IU/L (ref 39–117)
BUN/Creatinine Ratio: 19 (ref 12–28)
BUN: 13 mg/dL (ref 8–27)
Bilirubin Total: 0.6 mg/dL (ref 0.0–1.2)
CALCIUM: 9.8 mg/dL (ref 8.7–10.3)
CO2: 25 mmol/L (ref 18–29)
CREATININE: 0.69 mg/dL (ref 0.57–1.00)
Chloride: 104 mmol/L (ref 96–106)
GFR, EST AFRICAN AMERICAN: 96 mL/min/{1.73_m2} (ref >59)
GFR, EST NON AFRICAN AMERICAN: 84 mL/min/{1.73_m2} (ref >59)
GLOBULIN, TOTAL: 2.4 g/dL (ref 1.5–4.5)
Glucose: 91 mg/dL (ref 65–99)
Potassium: 4.1 mmol/L (ref 3.5–5.2)
SODIUM: 144 mmol/L (ref 134–144)
TOTAL PROTEIN: 6.9 g/dL (ref 6.0–8.5)

## 2016-04-05 LAB — CBC WITH DIFFERENTIAL/PLATELET
BASOS: 1 %
Basophils Absolute: 0.1 10*3/uL (ref 0.0–0.2)
EOS (ABSOLUTE): 0.2 10*3/uL (ref 0.0–0.4)
EOS: 2 %
HEMATOCRIT: 41.7 % (ref 34.0–46.6)
HEMOGLOBIN: 14.2 g/dL (ref 11.1–15.9)
IMMATURE GRANS (ABS): 0 10*3/uL (ref 0.0–0.1)
IMMATURE GRANULOCYTES: 0 %
LYMPHS: 23 %
Lymphocytes Absolute: 2.2 10*3/uL (ref 0.7–3.1)
MCH: 29.2 pg (ref 26.6–33.0)
MCHC: 34.1 g/dL (ref 31.5–35.7)
MCV: 86 fL (ref 79–97)
MONOCYTES: 11 %
MONOS ABS: 1 10*3/uL — AB (ref 0.1–0.9)
NEUTROS PCT: 63 %
Neutrophils Absolute: 6.1 10*3/uL (ref 1.4–7.0)
Platelets: 351 10*3/uL (ref 150–379)
RBC: 4.86 x10E6/uL (ref 3.77–5.28)
RDW: 15.6 % — AB (ref 12.3–15.4)
WBC: 9.6 10*3/uL (ref 3.4–10.8)

## 2016-04-05 LAB — LIPID PANEL WITH LDL/HDL RATIO
Cholesterol, Total: 201 mg/dL — ABNORMAL HIGH (ref 100–199)
HDL: 52 mg/dL (ref >39)
LDL CALC: 118 mg/dL — AB (ref 0–99)
LDL/HDL RATIO: 2.3 ratio (ref 0.0–3.2)
TRIGLYCERIDES: 156 mg/dL — AB (ref 0–149)
VLDL Cholesterol Cal: 31 mg/dL (ref 5–40)

## 2016-04-05 LAB — TSH: TSH: 2.2 u[IU]/mL (ref 0.450–4.500)

## 2016-04-06 ENCOUNTER — Telehealth: Payer: Self-pay

## 2016-04-06 ENCOUNTER — Other Ambulatory Visit: Payer: Self-pay | Admitting: Family Medicine

## 2016-04-06 DIAGNOSIS — Z1231 Encounter for screening mammogram for malignant neoplasm of breast: Secondary | ICD-10-CM

## 2016-04-06 NOTE — Telephone Encounter (Signed)
Patient advised as directed below.  Thanks,  -Analeah Brame 

## 2016-04-06 NOTE — Telephone Encounter (Signed)
-----   Message from [name]., MD sent at 04/05/2016  4:39 PM EST ----- Labs stable.

## 2016-04-06 NOTE — Telephone Encounter (Signed)
LMTCB 04/06/2016  Thanks,   -Mickel Baas

## 2016-04-18 ENCOUNTER — Other Ambulatory Visit: Payer: Self-pay | Admitting: Family Medicine

## 2016-04-18 ENCOUNTER — Ambulatory Visit
Admission: RE | Admit: 2016-04-18 | Discharge: 2016-04-18 | Disposition: A | Discharge status: Home / Self Care | Payer: Medicare Other | Source: Ambulatory Visit | Attending: Family Medicine | Admitting: Family Medicine

## 2016-04-18 DIAGNOSIS — Z1231 Encounter for screening mammogram for malignant neoplasm of breast: Secondary | ICD-10-CM | POA: Diagnosis present

## 2016-04-18 HISTORY — DX: Malignant neoplasm of unspecified site of unspecified female breast: C50.919

## 2016-04-21 DIAGNOSIS — M1712 Unilateral primary osteoarthritis, left knee: Secondary | ICD-10-CM | POA: Diagnosis not present

## 2016-05-09 DIAGNOSIS — Z96651 Presence of right artificial knee joint: Secondary | ICD-10-CM | POA: Diagnosis not present

## 2016-05-09 DIAGNOSIS — M1712 Unilateral primary osteoarthritis, left knee: Secondary | ICD-10-CM | POA: Diagnosis not present

## 2016-05-16 ENCOUNTER — Telehealth: Payer: Self-pay

## 2016-05-16 NOTE — Telephone Encounter (Signed)
Pt called requesting mammo results from 04/18/2016.Do not see where this has been reviewed. Renaldo Fiddler, CMA

## 2016-05-16 NOTE — Telephone Encounter (Signed)
Pt advised results were normal, pt states she did not get a letter from [place] and did not hear from us[facility]

## 2016-07-13 ENCOUNTER — Encounter: Payer: Self-pay | Admitting: Family Medicine

## 2016-07-13 ENCOUNTER — Ambulatory Visit (INDEPENDENT_AMBULATORY_CARE_PROVIDER_SITE_OTHER): Payer: Medicare Other | Admitting: Family Medicine

## 2016-07-13 VITALS — BP 118/68 | HR 76 | Temp 97.7°F | Resp 16 | Wt 207.0 lb

## 2016-07-13 DIAGNOSIS — G458 Other transient cerebral ischemic attacks and related syndromes: Secondary | ICD-10-CM | POA: Diagnosis not present

## 2016-07-13 DIAGNOSIS — R4781 Slurred speech: Secondary | ICD-10-CM

## 2016-07-13 DIAGNOSIS — R410 Disorientation, unspecified: Secondary | ICD-10-CM

## 2016-07-13 NOTE — Patient Instructions (Signed)
Someone will call you about your appointment with Neurology within the next couple of days.

## 2016-07-13 NOTE — Progress Notes (Signed)
Subjective:  HPI Pt reports that Monday night she started having confusion when she was cooking. She was trying to get something out of the oven and could not figure out what she needed to get it out with. Then her husband came in and gave her ASA 81 mg. She went over to sit down and says her head felt "weird and foggy" she could not talk or make out sentences. She and her husband says it lasted about 10 minutes and went away. She has been feeling tired since the episodes but unsure if it is her normal tiredness or something different. She has had 2 other episodes prior to this one. The first one went her to the ER but this one was very light and she could talk better than the ones in the past. Pt would like information on how she needs to handle these and what she needs to look for to go to the ER if she needs to take ASA when she has these and should she call here. She is no longer having any of these symptoms since Monday.   Prior to Admission medications   Medication Sig Start Date End Date Taking? Authorizing Provider  acetaminophen (TYLENOL) 500 MG tablet Take 500 mg by mouth every 6 (six) hours as needed.    Historical Provider, MD  aspirin 81 MG tablet Take 81 mg by mouth daily.  12/12/12   Historical Provider, MD  CALCIUM-VITAMIN D PO Take 2 tablets by mouth daily.  12/12/12   Historical Provider, MD  Cholecalciferol 1000 UNITS capsule Take 1,000 Units by mouth at bedtime.  12/12/12   Historical Provider, MD  ciprofloxacin (CIPRO) 250 MG tablet Take 1 tablet (250 mg total) by mouth 2 (two) times daily. 01/12/16   Lakeria Starkman Maceo Pro., MD  fenofibrate (TRICOR) 145 MG tablet Take 1 tablet (145 mg total) by mouth daily. 04/04/16   Kahleah Crass Maceo Pro., MD  hydrocortisone (ANUSOL-HC) 25 MG suppository Place 1 suppository (25 mg total) rectally at bedtime. 01/06/16   [name]., MD  MULTIPLE VITAMIN PO Take 1 tablet by mouth daily.  12/12/12   Historical Provider, MD  OMEGA-3 FATTY  ACIDS PO Take 1 capsule by mouth daily.  12/12/12   Historical Provider, MD  potassium chloride SA (K-DUR,KLOR-CON) 20 MEQ tablet Take 1 tablet (20 mEq total) by mouth daily. 10/25/15 11/14/15  Birdie Sons, MD  rosuvastatin (CRESTOR) 10 MG tablet Take 10 mg by mouth daily.    Historical Provider, MD  rosuvastatin (CRESTOR) 20 MG tablet Take 1 tablet (20 mg total) by mouth daily. 04/04/16   Charleston Vierling Maceo Pro., MD  traMADol (ULTRAM) 50 MG tablet Take 1-2 tablets (50-100 mg total) by mouth every 4 (four) hours as needed for moderate pain. 11/05/15   Duanne Guess, PA-C    Patient Active Problem List   Diagnosis Date Noted  . Fatigue 01/06/2016  . Urinary tract infection, site not specified 11/21/2015  . S/P total knee arthroplasty 11/03/2015  . Knee pain 09/28/2015  . Myalgia 09/28/2015  . Allergic rhinitis 01/06/2015  . Confusion state 01/06/2015  . Dermatitis, eczematoid 01/06/2015  . Episode of hypertension 01/06/2015  . Hypercholesterolemia 01/06/2015  . Angiopathy 01/06/2015  . Malignant neoplasm of breast ([place]) 01/06/2015  . Adiposity 01/06/2015  . Osteoarthritis 01/06/2015  . Osteopenia 01/06/2015  . Avitaminosis D 01/06/2015    Past Medical History:  Diagnosis Date  . Arthritis   . Breast cancer ([place])  1986   left mastectomy  . Cancer ([place]) 1986   left breast  . Elevated triglycerides with high cholesterol   . GERD (gastroesophageal reflux disease)    history of reflux  . Hypercholesterolemia   . Hypertension     Social History   Social History  . Marital status: Married    Spouse name: N/A  . Number of children: N/A  . Years of education: N/A   Occupational History  . Not on file.   Social History Main Topics  . Smoking status: Never Smoker  . Smokeless tobacco: Never Used  . Alcohol use No  . Drug use: No  . Sexual activity: Not on file   Other Topics Concern  . Not on file   Social History Narrative  . No narrative on file    Allergies    Allergen Reactions  . Penicillin V   . Penicillin V Potassium Hives    Has patient had a PCN reaction causing immediate rash, facial/tongue/throat swelling, SOB or lightheadedness with hypotension: no Has patient had a PCN reaction causing severe rash involving mucus membranes or skin necrosis: {no Has patient had a PCN reaction that required hospitalization no Has patient had a PCN reaction occurring within the last 10 years: no If all of the above answers are "NO", then may proceed with Cephalosporin use.  . Sulfa Antibiotics Hives    Review of Systems  Constitutional: Negative.   HENT: Negative.   Eyes: Negative.   Respiratory: Negative.   Cardiovascular: Negative.   Gastrointestinal: Negative.   Genitourinary: Negative.   Musculoskeletal: Negative.   Skin: Negative.   Neurological: Negative.   Endo/Heme/Allergies: Negative.   Psychiatric/Behavioral: Negative.     Immunization History  Administered Date(s) Administered  . Influenza, High Dose Seasonal PF 01/14/2015  . Pneumococcal Conjugate-13 09/08/2013  . Pneumococcal Polysaccharide-23 01/04/2011    Objective:  BP 118/68 (BP Location: Left Arm, Patient Position: Sitting, Cuff Size: Large)   Pulse 76   Temp 97.7 F (36.5 C) (Oral)   Resp 16   Wt 207 lb (93.9 kg)   SpO2 97%   BMI 36.67 kg/m   Physical Exam  Constitutional: She is oriented to person, place, and time and well-developed, well-nourished, and in no distress.  Eyes: Conjunctivae and EOM are normal. Pupils are equal, round, and reactive to light.  Neck: Normal range of motion. Neck supple.  Cardiovascular: Normal rate, regular rhythm, normal heart sounds and intact distal pulses.   Pulmonary/Chest: Effort normal and breath sounds normal.  Musculoskeletal: Normal range of motion.  Neurological: She is alert and oriented to person, place, and time. She has normal reflexes. No cranial nerve deficit. She exhibits normal muscle tone. Gait normal.  Coordination normal. GCS score is 15.  Grossly nonfocal neurologic exam  Skin: Skin is warm and dry.  Psychiatric: Mood, memory, affect and judgment normal.    Lab Results  Component Value Date   WBC 9.6 04/04/2016   HGB 11.4 (L) 11/05/2015   HCT 41.7 04/04/2016   PLT 351 04/04/2016   GLUCOSE 91 04/04/2016   CHOL 201 (H) 04/04/2016   TRIG 156 (H) 04/04/2016   HDL 52 04/04/2016   LDLCALC 118 (H) 04/04/2016   TSH 2.200 04/04/2016   INR 1.00 10/20/2015    CMP     Component Value Date/Time   NA 144 04/04/2016 1515   K 4.1 04/04/2016 1515   CL 104 04/04/2016 1515   CO2 25 04/04/2016 1515   GLUCOSE 91  04/04/2016 1515   GLUCOSE 104 (H) 11/06/2015 0343   BUN 13 04/04/2016 1515   CREATININE 0.69 04/04/2016 1515   CALCIUM 9.8 04/04/2016 1515   PROT 6.9 04/04/2016 1515   ALBUMIN 4.5 04/04/2016 1515   AST 20 04/04/2016 1515   ALT 17 04/04/2016 1515   ALKPHOS 64 04/04/2016 1515   BILITOT 0.6 04/04/2016 1515   GFRNONAA 84 04/04/2016 1515   GFRAA 96 04/04/2016 1515    Assessment and Plan :  1. Other specified transient cerebral ischemias/TIA  Take ASA daily. - Ambulatory referral to Neurology  2. Confusion  - Ambulatory referral to Neurology  3. Slurred speech  - Ambulatory referral to Neurology   HPI, Exam, and A&P Transcribed under the direction and in the presence of Annaliza Zia L. Cranford Mon, MD  Electronically Signed: Katina Dung, CMA I have done the exam and reviewed the above chart and it is accurate to the best of my knowledge. Development worker, community has been used in this note in any air is in the dictation or transcription are unintentional.  [facility] 07/13/2016 3:58 PM

## 2016-07-18 DIAGNOSIS — R404 Transient alteration of awareness: Secondary | ICD-10-CM | POA: Diagnosis not present

## 2016-07-18 DIAGNOSIS — F99 Mental disorder, not otherwise specified: Secondary | ICD-10-CM | POA: Diagnosis not present

## 2016-07-21 ENCOUNTER — Other Ambulatory Visit: Payer: Self-pay | Admitting: Neurology

## 2016-07-21 DIAGNOSIS — R404 Transient alteration of awareness: Secondary | ICD-10-CM

## 2016-08-03 DIAGNOSIS — R410 Disorientation, unspecified: Secondary | ICD-10-CM | POA: Diagnosis not present

## 2016-08-04 ENCOUNTER — Ambulatory Visit: Payer: Medicare Other

## 2016-08-15 ENCOUNTER — Ambulatory Visit
Admission: RE | Admit: 2016-08-15 | Discharge: 2016-08-15 | Disposition: A | Discharge status: Home / Self Care | Payer: Medicare Other | Source: Ambulatory Visit | Attending: Neurology | Admitting: Neurology

## 2016-08-15 DIAGNOSIS — R404 Transient alteration of awareness: Secondary | ICD-10-CM | POA: Insufficient documentation

## 2016-08-15 DIAGNOSIS — R4182 Altered mental status, unspecified: Secondary | ICD-10-CM | POA: Diagnosis not present

## 2016-08-18 ENCOUNTER — Telehealth: Payer: Self-pay | Admitting: Family Medicine

## 2016-08-21 DIAGNOSIS — L304 Erythema intertrigo: Secondary | ICD-10-CM | POA: Diagnosis not present

## 2016-08-21 DIAGNOSIS — L218 Other seborrheic dermatitis: Secondary | ICD-10-CM | POA: Diagnosis not present

## 2016-08-21 DIAGNOSIS — L821 Other seborrheic keratosis: Secondary | ICD-10-CM | POA: Diagnosis not present

## 2016-09-07 DIAGNOSIS — H2511 Age-related nuclear cataract, right eye: Secondary | ICD-10-CM | POA: Diagnosis not present

## 2016-09-18 DIAGNOSIS — R404 Transient alteration of awareness: Secondary | ICD-10-CM | POA: Diagnosis not present

## 2016-09-18 DIAGNOSIS — F99 Mental disorder, not otherwise specified: Secondary | ICD-10-CM | POA: Diagnosis not present

## 2016-09-18 DIAGNOSIS — R410 Disorientation, unspecified: Secondary | ICD-10-CM | POA: Diagnosis not present

## 2016-10-03 ENCOUNTER — Ambulatory Visit (INDEPENDENT_AMBULATORY_CARE_PROVIDER_SITE_OTHER): Payer: Medicare Other | Admitting: Family Medicine

## 2016-10-03 ENCOUNTER — Ambulatory Visit: Payer: Medicare Other | Admitting: Family Medicine

## 2016-10-03 VITALS — BP 112/72 | HR 80 | Temp 97.7°F | Resp 16 | Wt 205.0 lb

## 2016-10-03 DIAGNOSIS — Z96651 Presence of right artificial knee joint: Secondary | ICD-10-CM

## 2016-10-03 DIAGNOSIS — Z6836 Body mass index (BMI) 36.0-36.9, adult: Secondary | ICD-10-CM | POA: Diagnosis not present

## 2016-10-03 DIAGNOSIS — E7800 Pure hypercholesterolemia, unspecified: Secondary | ICD-10-CM | POA: Diagnosis not present

## 2016-10-03 DIAGNOSIS — R030 Elevated blood-pressure reading, without diagnosis of hypertension: Secondary | ICD-10-CM | POA: Diagnosis not present

## 2016-10-03 DIAGNOSIS — M170 Bilateral primary osteoarthritis of knee: Secondary | ICD-10-CM | POA: Diagnosis not present

## 2016-10-03 MED ORDER — ROSUVASTATIN CALCIUM 20 MG PO TABS: 10.0000 mg | ORAL_TABLET | Freq: Every day | ORAL | 12 refills | Status: DC

## 2016-10-03 NOTE — Patient Instructions (Addendum)
Stop taking Fenofibrate. Continue generic Crestor. Continue checking b/p at home. Will follow up on the next visit. Bring your b/p machine on the next visit.

## 2016-10-03 NOTE — Progress Notes (Signed)
Elizabeth Trujillo  MRN: [id number] DOB: 1935/06/14  Subjective:  HPI  Patient is here for follow up. Last office visit was on 07/13/16 for TIA, confusion and slurred speech. Patient was referred to neurology and saw Dr Manuella Ghazi. Patient had MRI , CT scan and EEG done and was told that there was not sign of seizure or stroke on the imaging. Patient has felt fine since that episode, has not had any more of the symptoms she did then.  BP Readings from Last 3 Encounters:  10/03/16 112/72  07/13/16 118/68  04/04/16 124/68    Hyperlipidemia: patient takes Crestor and Fenofibrate. Lab Results  Component Value Date   CHOL 201 (H) 04/04/2016   HDL 52 04/04/2016   LDLCALC 118 (H) 04/04/2016   TRIG 156 (H) 04/04/2016   Routine lab work done in January 2018. Patient has noticed that she is getting higher b/p readings at home and when she saw Dr Manuella Ghazi in May for the first time. Patient Active Problem List   Diagnosis Date Noted  . Fatigue 01/06/2016  . Urinary tract infection, site not specified 11/21/2015  . S/P total knee arthroplasty 11/03/2015  . Knee pain 09/28/2015  . Myalgia 09/28/2015  . Allergic rhinitis 01/06/2015  . Confusion state 01/06/2015  . Dermatitis, eczematoid 01/06/2015  . Episode of hypertension 01/06/2015  . Hypercholesterolemia 01/06/2015  . Angiopathy 01/06/2015  . Malignant neoplasm of breast ([place]) 01/06/2015  . Adiposity 01/06/2015  . Osteoarthritis 01/06/2015  . Osteopenia 01/06/2015  . Avitaminosis D 01/06/2015    Past Medical History:  Diagnosis Date  . Arthritis   . Breast cancer ([place]) 1986   left mastectomy  . Cancer ([place]) 1986   left breast  . Elevated triglycerides with high cholesterol   . GERD (gastroesophageal reflux disease)    history of reflux  . Hypercholesterolemia   . Hypertension     Social History   Social History  . Marital status: Married    Spouse name: N/A  . Number of children: N/A  . Years of education: N/A    Occupational History  . Not on file.   Social History Main Topics  . Smoking status: Never Smoker  . Smokeless tobacco: Never Used  . Alcohol use No  . Drug use: No  . Sexual activity: Not on file   Other Topics Concern  . Not on file   Social History Narrative  . No narrative on file    Outpatient Encounter Prescriptions as of 10/03/2016  Medication Sig  . aspirin 81 MG tablet Take 81 mg by mouth daily.   Marland Kitchen CALCIUM-VITAMIN D PO Take 2 tablets by mouth daily.   . Cholecalciferol 1000 UNITS capsule Take 1,000 Units by mouth at bedtime.   . fenofibrate (TRICOR) 145 MG tablet Take 1 tablet (145 mg total) by mouth daily.  . MULTIPLE VITAMIN PO Take 1 tablet by mouth daily.   . OMEGA-3 FATTY ACIDS PO Take 1 capsule by mouth daily.   . rosuvastatin (CRESTOR) 10 MG tablet Take 10 mg by mouth daily.  . [DISCONTINUED] acetaminophen (TYLENOL) 500 MG tablet Take 500 mg by mouth every 6 (six) hours as needed.  . [DISCONTINUED] ciprofloxacin (CIPRO) 250 MG tablet Take 1 tablet (250 mg total) by mouth 2 (two) times daily.  . [DISCONTINUED] potassium chloride SA (K-DUR,KLOR-CON) 20 MEQ tablet Take 1 tablet (20 mEq total) by mouth daily.   No facility-administered encounter medications on file as of 10/03/2016.     Allergies  Allergen Reactions  . Penicillin V   . Penicillin V Potassium Hives    Has patient had a PCN reaction causing immediate rash, facial/tongue/throat swelling, SOB or lightheadedness with hypotension: no Has patient had a PCN reaction causing severe rash involving mucus membranes or skin necrosis: {no Has patient had a PCN reaction that required hospitalization no Has patient had a PCN reaction occurring within the last 10 years: no If all of the above answers are "NO", then may proceed with Cephalosporin use.  . Sulfa Antibiotics Hives    Review of Systems  Constitutional: Negative.   Eyes: Negative.   Respiratory: Negative.   Cardiovascular: Negative.    Gastrointestinal: Negative.   Musculoskeletal: Positive for joint pain (left knee pain, abnormal gait somewhat due to this).  Skin: Negative.   Neurological: Negative.   Endo/Heme/Allergies: Negative.   Psychiatric/Behavioral: Negative.     Objective:  BP 112/72   Pulse 80   Temp 97.7 F (36.5 C)   Resp 16   Wt 205 lb (93 kg)   BMI 36.31 kg/m   Physical Exam  Constitutional: She is oriented to person, place, and time and well-developed, well-nourished, and in no distress.  HENT:  Head: Normocephalic and atraumatic.  Eyes: Pupils are equal, round, and reactive to light. Conjunctivae are normal.  Neck: Normal range of motion. Neck supple.  Cardiovascular: Normal rate, regular rhythm, normal heart sounds and intact distal pulses.  Exam reveals no gallop.   No murmur heard. Pulmonary/Chest: Effort normal and breath sounds normal. No respiratory distress. She has no wheezes. Right breast exhibits no nipple discharge.  Mastectomy scar on the left present, normal breast exam on the right.  Musculoskeletal: She exhibits no edema or tenderness.  Neurological: She is alert and oriented to person, place, and time.  Psychiatric: Mood, memory, affect and judgment normal.    Assessment and Plan :  1. Hypercholesterolemia Due to new guidelines will stop Fenofibrate and continue Crestor and re check in 2 months.   2. Elevated blood pressure reading B/p reading today is good. Advised patient to continue monitoring b/p at home. May need to start medication on the next visit and discussed with patient the possibility of this route.  3. Primary osteoarthritis of both knees 4. BMI 36.0-36.9,adult Work on habits.  5. Status post total right knee replacement Doing well. 6.TIA 7.h/o UTI 8.s/p left mastectomy for Breast Cancer  HPI, Exam and A&P transcribed by Theressa Millard, RMA under direction and in the presence of Miguel Aschoff, MD. I have done the exam and reviewed the  chart and it is accurate to the best of my knowledge. Development worker, community has been used and  any errors in dictation or transcription are unintentional. Miguel Aschoff M.D. [facility]

## 2016-11-21 ENCOUNTER — Ambulatory Visit: Payer: Medicare Other | Admitting: Family Medicine

## 2016-12-05 ENCOUNTER — Ambulatory Visit (INDEPENDENT_AMBULATORY_CARE_PROVIDER_SITE_OTHER): Payer: Medicare Other | Admitting: Family Medicine

## 2016-12-05 VITALS — BP 128/72 | HR 76 | Temp 97.5°F | Resp 16 | Wt 204.0 lb

## 2016-12-05 DIAGNOSIS — E7800 Pure hypercholesterolemia, unspecified: Secondary | ICD-10-CM | POA: Diagnosis not present

## 2016-12-05 DIAGNOSIS — Z23 Encounter for immunization: Secondary | ICD-10-CM | POA: Diagnosis not present

## 2016-12-05 LAB — LIPID PANEL
CHOL/HDL RATIO: 4.1 (calc) (ref <5.0)
CHOLESTEROL: 187 mg/dL (ref <200)
HDL: 46 mg/dL — AB (ref >50)
LDL CHOLESTEROL (CALC): 101 mg/dL — AB
NON-HDL CHOLESTEROL (CALC): 141 mg/dL — AB (ref <130)
TRIGLYCERIDES: 299 mg/dL — AB (ref <150)

## 2016-12-05 NOTE — Progress Notes (Signed)
Elizabeth Trujillo  MRN: [id number] DOB: 1935-06-09  Subjective:  HPI   The patient is a 81 year old female who presents today for follow up on her cholesterol level after stopping her Fenofibrate.  The patient was last seen on 10/03/16 and it was recommended that due to the new guidelines she did not need to be on the Fenofibrate while on Crestor.  The patient presents for lab work and states she is fasting.  Patient Active Problem List   Diagnosis Date Noted  . Fatigue 01/06/2016  . Urinary tract infection, site not specified 11/21/2015  . S/P total knee arthroplasty 11/03/2015  . Knee pain 09/28/2015  . Myalgia 09/28/2015  . Allergic rhinitis 01/06/2015  . Confusion state 01/06/2015  . Dermatitis, eczematoid 01/06/2015  . Episode of hypertension 01/06/2015  . Hypercholesterolemia 01/06/2015  . Angiopathy 01/06/2015  . Malignant neoplasm of breast ([place]) 01/06/2015  . Adiposity 01/06/2015  . Osteoarthritis 01/06/2015  . Osteopenia 01/06/2015  . Avitaminosis D 01/06/2015    Past Medical History:  Diagnosis Date  . Arthritis   . Breast cancer ([place]) 1986   left mastectomy  . Cancer ([place]) 1986   left breast  . Elevated triglycerides with high cholesterol   . GERD (gastroesophageal reflux disease)    history of reflux  . Hypercholesterolemia   . Hypertension     Social History   Social History  . Marital status: Married    Spouse name: N/A  . Number of children: N/A  . Years of education: N/A   Occupational History  . Not on file.   Social History Main Topics  . Smoking status: Never Smoker  . Smokeless tobacco: Never Used  . Alcohol use No  . Drug use: No  . Sexual activity: Not on file   Other Topics Concern  . Not on file   Social History Narrative  . No narrative on file    Outpatient Encounter Prescriptions as of 12/05/2016  Medication Sig  . aspirin 81 MG tablet Take 81 mg by mouth daily.   Marland Kitchen CALCIUM-VITAMIN D PO Take 2 tablets by mouth daily.     . Cholecalciferol 1000 UNITS capsule Take 1,000 Units by mouth at bedtime.   . MULTIPLE VITAMIN PO Take 1 tablet by mouth daily.   . OMEGA-3 FATTY ACIDS PO Take 1 capsule by mouth daily.   . rosuvastatin (CRESTOR) 20 MG tablet Take 0.5 tablets (10 mg total) by mouth daily.   No facility-administered encounter medications on file as of 12/05/2016.     Allergies  Allergen Reactions  . Penicillin V   . Penicillin V Potassium Hives    Has patient had a PCN reaction causing immediate rash, facial/tongue/throat swelling, SOB or lightheadedness with hypotension: no Has patient had a PCN reaction causing severe rash involving mucus membranes or skin necrosis: {no Has patient had a PCN reaction that required hospitalization no Has patient had a PCN reaction occurring within the last 10 years: no If all of the above answers are "NO", then may proceed with Cephalosporin use.  . Sulfa Antibiotics Hives    Review of Systems  Constitutional: Negative.   Eyes: Negative.   Respiratory: Negative for cough, hemoptysis, sputum production, shortness of breath and wheezing.   Cardiovascular: Positive for leg swelling (ankle). Negative for chest pain, palpitations and orthopnea.  Gastrointestinal: Negative.   Skin: Negative.   Endo/Heme/Allergies: Negative.   Psychiatric/Behavioral: Negative.     Objective:  BP 128/72 (BP Location: Right Arm,  Patient Position: Sitting, Cuff Size: Normal)   Pulse 76   Temp (!) 97.5 F (36.4 C) (Oral)   Resp 16   Wt 204 lb (92.5 kg)   BMI 36.14 kg/m   Physical Exam  Constitutional: She is oriented to person, place, and time and well-developed, well-nourished, and in no distress.  HENT:  Head: Normocephalic and atraumatic.  Eyes: Pupils are equal, round, and reactive to light.  Neck: Normal range of motion.  Cardiovascular: Normal rate, regular rhythm and normal heart sounds.   Pulmonary/Chest: Effort normal and breath sounds normal.  Abdominal: Soft.   Musculoskeletal: She exhibits edema (1+ ankle edema).  Neurological: She is alert and oriented to person, place, and time. Gait normal. GCS score is 15.  Skin: Skin is warm and dry.  Psychiatric: Mood, memory, affect and judgment normal.    Assessment and Plan :  1. Hypercholesterolemia D/c Fenofibrate. - Lipid panel  2. Need for influenza vaccination  - Flu vaccine HIGH DOSE PF (Fluzone High dose) 3.s/p Mastectomy for Breast Cancer  I have done the exam and reviewed the chart and it is accurate to the best of my knowledge. Development worker, community has been used and  any errors in dictation or transcription are unintentional. Miguel Aschoff M.D. [facility]

## 2016-12-07 ENCOUNTER — Telehealth: Payer: Self-pay

## 2016-12-07 NOTE — Telephone Encounter (Signed)
-----   Message from [name]., MD sent at 12/07/2016 10:43 AM EDT ----- Stable off of fenofibrate. Triglycerides a little bit higher, work on diet and exercise to try to keep these down.

## 2016-12-07 NOTE — Telephone Encounter (Signed)
Stay on 10 for now

## 2016-12-07 NOTE — Telephone Encounter (Signed)
Patient was advised , patient wants to know should she stay on Crestor 10mg  or increase back up to 20mg  qd? KW

## 2016-12-08 NOTE — Telephone Encounter (Signed)
[id number] 

## 2016-12-11 NOTE — Telephone Encounter (Signed)
Patient advised she wants to know if she should come back to office for lab work prior to her physical in 04/2017?KW

## 2016-12-20 NOTE — Telephone Encounter (Signed)
We will arrange labs during visit.

## 2016-12-20 NOTE — Telephone Encounter (Signed)
Husband Miguel Aschoff, RMA

## 2017-01-02 DIAGNOSIS — E669 Obesity, unspecified: Secondary | ICD-10-CM | POA: Diagnosis not present

## 2017-01-02 DIAGNOSIS — Z6836 Body mass index (BMI) 36.0-36.9, adult: Secondary | ICD-10-CM | POA: Diagnosis not present

## 2017-01-02 DIAGNOSIS — M1712 Unilateral primary osteoarthritis, left knee: Secondary | ICD-10-CM | POA: Diagnosis not present

## 2017-01-29 DIAGNOSIS — M1712 Unilateral primary osteoarthritis, left knee: Secondary | ICD-10-CM | POA: Diagnosis not present

## 2017-02-05 DIAGNOSIS — M1712 Unilateral primary osteoarthritis, left knee: Secondary | ICD-10-CM | POA: Diagnosis not present

## 2017-02-13 DIAGNOSIS — M1712 Unilateral primary osteoarthritis, left knee: Secondary | ICD-10-CM | POA: Diagnosis not present

## 2017-03-02 ENCOUNTER — Ambulatory Visit (INDEPENDENT_AMBULATORY_CARE_PROVIDER_SITE_OTHER): Payer: Medicare Other | Admitting: Family Medicine

## 2017-03-02 ENCOUNTER — Encounter: Payer: Self-pay | Admitting: Family Medicine

## 2017-03-02 VITALS — BP 118/70 | HR 61 | Temp 97.6°F | Wt 203.4 lb

## 2017-03-02 DIAGNOSIS — R3121 Asymptomatic microscopic hematuria: Secondary | ICD-10-CM | POA: Diagnosis not present

## 2017-03-02 LAB — POCT URINALYSIS DIPSTICK
Bilirubin, UA: NEGATIVE
GLUCOSE UA: NEGATIVE
Ketones, UA: NEGATIVE
NITRITE UA: NEGATIVE
PROTEIN UA: NEGATIVE
SPEC GRAV UA: 1.01 (ref 1.010–1.025)
Urobilinogen, UA: 0.2 E.U./dL
pH, UA: 8 (ref 5.0–8.0)

## 2017-03-02 MED ORDER — CIPROFLOXACIN HCL 250 MG PO TABS: 250.0000 mg | ORAL_TABLET | Freq: Two times a day (BID) | ORAL | 0 refills | Status: DC

## 2017-03-02 NOTE — Progress Notes (Signed)
[name] Female    DOB: September 01, 1935   81 y.o.   MRN: [id number] Visit Date: 03/02/2017  Today's Provider: Vernie Murders, PA   Chief Complaint  Patient presents with  . Urinary Tract Infection   Subjective:    Urinary Tract Infection   This is a new problem. Episode onset: Monday. The problem occurs intermittently. The problem has been waxing and waning. The patient is experiencing no pain. There has been no fever. Pertinent negatives include no frequency, hesitancy or urgency. Associated symptoms comments: Discolored urine. She has tried nothing for the symptoms.   Past Medical History:  Diagnosis Date  . Arthritis   . Breast cancer ([place]) 1986   left mastectomy  . Cancer ([place]) 1986   left breast  . Elevated triglycerides with high cholesterol   . GERD (gastroesophageal reflux disease)    history of reflux  . Hypercholesterolemia   . Hypertension    Past Surgical History:  Procedure Laterality Date  . ABDOMINAL HYSTERECTOMY  308657   TAH/BSO  . BREAST SURGERY Left 07/1984   Mastectomy  . CATARACT EXTRACTION W/ INTRAOCULAR LENS IMPLANT Left 2015  . EYE SURGERY Left 2013  . FOOT SURGERY Left 04/2013  . KNEE ARTHROPLASTY Right 11/03/2015   Procedure: COMPUTER ASSISTED TOTAL KNEE ARTHROPLASTY;  Surgeon: Dereck Leep, MD;  Location: [facility] ORS;  Service: Orthopedics;  Laterality: Right;  . MASTECTOMY Left 1986   positive   Family History  Problem Relation Age of Onset  . Cancer Mother        baldder and breast  . Glaucoma Mother   . Kidney disease Mother   . Arthritis Mother   . Stroke Mother   . Breast cancer Mother 91  . Heart disease Father   . Cancer Brother        lung cancer, smoker  . Breast cancer Cousin        mat cousin   Allergies  Allergen Reactions  . Penicillin V   . Penicillin V Potassium Hives    Has patient had a PCN reaction causing immediate rash, facial/tongue/throat swelling, SOB or lightheadedness with hypotension:  no Has patient had a PCN reaction causing severe rash involving mucus membranes or skin necrosis: {no Has patient had a PCN reaction that required hospitalization no Has patient had a PCN reaction occurring within the last 10 years: no If all of the above answers are "NO", then may proceed with Cephalosporin use.  . Sulfa Antibiotics Hives    Current Outpatient Medications:  .  aspirin 81 MG tablet, Take 81 mg by mouth daily. , Disp: , Rfl:  .  CALCIUM-VITAMIN D PO, Take 2 tablets by mouth daily. , Disp: , Rfl:  .  Cholecalciferol 1000 UNITS capsule, Take 1,000 Units by mouth at bedtime. , Disp: , Rfl:  .  MELOXICAM PO, Take by mouth daily., Disp: , Rfl:  .  MULTIPLE VITAMIN PO, Take 1 tablet by mouth daily. , Disp: , Rfl:  .  OMEGA-3 FATTY ACIDS PO, Take 1 capsule by mouth daily. , Disp: , Rfl:  .  rosuvastatin (CRESTOR) 20 MG tablet, Take 0.5 tablets (10 mg total) by mouth daily., Disp: 30 tablet, Rfl: 12  Review of Systems  Constitutional: Negative.   Respiratory: Negative.   Cardiovascular: Negative.   Genitourinary: Negative for frequency, hesitancy and urgency.   Social History   Tobacco Use  . Smoking status: Never Smoker  . Smokeless tobacco: Never Used  Substance Use Topics  . Alcohol use: No   Objective:   BP 118/70 (BP Location: Left Arm, Patient Position: Sitting, Cuff Size: Normal)   Pulse 61   Temp 97.6 F (36.4 C) (Oral)   Wt 203 lb 6.4 oz (92.3 kg)   SpO2 98%   BMI 36.03 kg/m    Physical Exam  Constitutional: She is oriented to person, place, and time. She appears well-developed and well-nourished. No distress.  HENT:  Head: Normocephalic and atraumatic.  Right Ear: Hearing normal.  Left Ear: Hearing normal.  Nose: Nose normal.  Eyes: Conjunctivae and lids are normal. Right eye exhibits no discharge. Left eye exhibits no discharge. No scleral icterus.  Neck: Neck supple.  Cardiovascular: Normal rate and regular rhythm.  Pulmonary/Chest: Effort  normal. No respiratory distress.  Abdominal: Soft.  Musculoskeletal: Normal range of motion.  Neurological: She is alert and oriented to person, place, and time.  Skin: Skin is intact. No lesion and no rash noted.  Psychiatric: She has a normal mood and affect. Her speech is normal and behavior is normal. Thought content normal.        Assessment & Plan:     1. Asymptomatic microscopic hematuria Onset 5 days ago without burning, frequency or urgency. Microscopic exam shows TNTC RBC's with some bacteria and WBC's per hpf. Increase fluid intake and may use AZO-Standard prn discomfort. History of UTI's in the past without discomfort symptoms. - POCT Urinalysis Dipstick - Urine Culture - ciprofloxacin (CIPRO) 250 MG tablet; Take 1 tablet (250 mg total) by mouth 2 (two) times daily.  Dispense: 6 tablet; Refill: [place], PA  [facility]

## 2017-03-03 LAB — URINE CULTURE
MICRO NUMBER: 81440293
SPECIMEN QUALITY: ADEQUATE

## 2017-03-13 DIAGNOSIS — D090 Carcinoma in situ of bladder: Secondary | ICD-10-CM

## 2017-03-13 DIAGNOSIS — C662 Malignant neoplasm of left ureter: Secondary | ICD-10-CM

## 2017-03-13 HISTORY — DX: Malignant neoplasm of left ureter: C66.2

## 2017-03-13 HISTORY — DX: Carcinoma in situ of bladder: D09.0

## 2017-03-15 ENCOUNTER — Encounter: Payer: Self-pay | Admitting: Family Medicine

## 2017-03-15 ENCOUNTER — Ambulatory Visit (INDEPENDENT_AMBULATORY_CARE_PROVIDER_SITE_OTHER): Payer: Medicare Other | Admitting: Family Medicine

## 2017-03-15 ENCOUNTER — Telehealth: Payer: Self-pay | Admitting: Family Medicine

## 2017-03-15 VITALS — BP 110/72 | HR 76 | Temp 97.6°F | Wt 205.2 lb

## 2017-03-15 DIAGNOSIS — R319 Hematuria, unspecified: Secondary | ICD-10-CM | POA: Diagnosis not present

## 2017-03-15 DIAGNOSIS — R3121 Asymptomatic microscopic hematuria: Secondary | ICD-10-CM

## 2017-03-15 DIAGNOSIS — B372 Candidiasis of skin and nail: Secondary | ICD-10-CM | POA: Diagnosis not present

## 2017-03-15 LAB — POCT URINALYSIS DIPSTICK
BILIRUBIN UA: NEGATIVE
Glucose, UA: NEGATIVE
KETONES UA: NEGATIVE
Nitrite, UA: NEGATIVE
PH UA: 7 (ref 5.0–8.0)
Spec Grav, UA: 1.01 (ref 1.010–1.025)
UROBILINOGEN UA: 0.2 U/dL

## 2017-03-15 MED ORDER — CIPROFLOXACIN HCL 250 MG PO TABS: 250.0000 mg | ORAL_TABLET | Freq: Two times a day (BID) | ORAL | 0 refills | Status: DC

## 2017-03-15 NOTE — Progress Notes (Signed)
[name] Female    DOB: 20-Nov-1935   82 y.o.   MRN: [id number] Visit Date: 03/15/2017  Today's Provider: Vernie Murders, PA   Chief Complaint  Patient presents with  . Hematuria   Subjective:    Hematuria   Patient presents for a follow up. Last OV was on 03/02/17. She was prescribed Cipro 250 mg for asymptomatic hematuria. She reports symptoms improved after completing the medication, but she has intermittent episodes the past 2 days. Patient denies any other UTI symptoms. Urine culture showed several different bacteria.  Past Medical History:  Diagnosis Date  . Arthritis   . Breast cancer ([place]) 1986   left mastectomy  . Cancer ([place]) 1986   left breast  . Elevated triglycerides with high cholesterol   . GERD (gastroesophageal reflux disease)    history of reflux  . Hypercholesterolemia   . Hypertension    Past Surgical History:  Procedure Laterality Date  . ABDOMINAL HYSTERECTOMY  416606   TAH/BSO  . BREAST SURGERY Left 07/1984   Mastectomy  . CATARACT EXTRACTION W/ INTRAOCULAR LENS IMPLANT Left 2015  . EYE SURGERY Left 2013  . FOOT SURGERY Left 04/2013  . KNEE ARTHROPLASTY Right 11/03/2015   Procedure: COMPUTER ASSISTED TOTAL KNEE ARTHROPLASTY;  Surgeon: Dereck Leep, MD;  Location: [facility] ORS;  Service: Orthopedics;  Laterality: Right;  . MASTECTOMY Left 1986   positive   Family History  Problem Relation Age of Onset  . Cancer Mother        baldder and breast  . Glaucoma Mother   . Kidney disease Mother   . Arthritis Mother   . Stroke Mother   . Breast cancer Mother 33  . Heart disease Father   . Cancer Brother        lung cancer, smoker  . Breast cancer Cousin        mat cousin   Allergies  Allergen Reactions  . Penicillin V   . Penicillin V Potassium Hives    Has patient had a PCN reaction causing immediate rash, facial/tongue/throat swelling, SOB or lightheadedness with hypotension: no Has patient had a PCN reaction causing  severe rash involving mucus membranes or skin necrosis: {no Has patient had a PCN reaction that required hospitalization no Has patient had a PCN reaction occurring within the last 10 years: no If all of the above answers are "NO", then may proceed with Cephalosporin use.  . Sulfa Antibiotics Hives    Current Outpatient Medications:  .  aspirin 81 MG tablet, Take 81 mg by mouth daily. , Disp: , Rfl:  .  CALCIUM-VITAMIN D PO, Take 2 tablets by mouth daily. , Disp: , Rfl:  .  Cholecalciferol 1000 UNITS capsule, Take 1,000 Units by mouth at bedtime. , Disp: , Rfl:  .  MELOXICAM PO, Take by mouth daily., Disp: , Rfl:  .  MULTIPLE VITAMIN PO, Take 1 tablet by mouth daily. , Disp: , Rfl:  .  OMEGA-3 FATTY ACIDS PO, Take 1 capsule by mouth daily. , Disp: , Rfl:  .  rosuvastatin (CRESTOR) 20 MG tablet, Take 0.5 tablets (10 mg total) by mouth daily., Disp: 30 tablet, Rfl: 12  Review of Systems  Constitutional: Negative.   Respiratory: Negative.   Cardiovascular: Negative.   Genitourinary: Positive for hematuria.   Social History   Tobacco Use  . Smoking status: Never Smoker  . Smokeless tobacco: Never Used  Substance Use Topics  . Alcohol use:  No   Objective:   BP 110/72 (BP Location: Right Arm, Patient Position: Sitting, Cuff Size: Normal)   Pulse 76   Temp 97.6 F (36.4 C) (Oral)   Wt 205 lb 3.2 oz (93.1 kg)   SpO2 97%   BMI 36.35 kg/m   Physical Exam  Constitutional: She is oriented to person, place, and time. She appears well-developed and well-nourished. No distress.  HENT:  Head: Normocephalic and atraumatic.  Right Ear: Hearing normal.  Left Ear: Hearing normal.  Nose: Nose normal.  Eyes: Conjunctivae and lids are normal. Right eye exhibits no discharge. Left eye exhibits no discharge. No scleral icterus.  Cardiovascular: Normal rate and regular rhythm.  Pulmonary/Chest: Effort normal and breath sounds normal. No respiratory distress.  Abdominal: Soft. Bowel sounds are  normal. She exhibits no distension and no mass. There is no tenderness. There is no rebound and no guarding.  Musculoskeletal: Normal range of motion.  Neurological: She is alert and oriented to person, place, and time.  Skin: Skin is intact. No lesion and no rash noted.  Psychiatric: She has a normal mood and affect. Her speech is normal and behavior is normal. Thought content normal.      Assessment & Plan:     1. Asymptomatic microscopic hematuria Infrequent dark urine with RBC's, WBC's, epithelial cells and bacteria on urinalysis. Was better and urine clear yellow when she took 3 days of Cipro on 03-02-17. No dysuria, back pain or fever. Will give 7 days of Cipro, recheck urine culture and schedule urology referral to rule out bladder cancer (mother had a history of bladder cancer, breast cancer and kidney stone but live to be 38). - POCT Urinalysis Dipstick - Urine Culture - ciprofloxacin (CIPRO) 250 MG tablet; Take 1 tablet (250 mg total) by mouth 2 (two) times daily.  Dispense: 14 tablet; Refill: 0 - Ambulatory referral to Urology       Vernie Murders, [facility]

## 2017-03-15 NOTE — Telephone Encounter (Signed)
Made pt appt instead of message.

## 2017-03-16 ENCOUNTER — Telehealth: Payer: Self-pay | Admitting: Family Medicine

## 2017-03-16 LAB — URINE CULTURE: Organism ID, Bacteria: NO GROWTH

## 2017-03-16 NOTE — Telephone Encounter (Signed)
Hold the Rosuvastatin until she finishes the Fluconazole, then add it back.

## 2017-03-16 NOTE — Telephone Encounter (Signed)
Dr. Signa Kell, Dermatology, has placed pt. On  Fluconazole 200 mg. For 1 mth and we have her on Rosuvastatin.   She was told not to take a statin with the Fluconazole.   Can she d/c the Rosuvastatin for 1 mth or what does she need to do?

## 2017-03-17 NOTE — Telephone Encounter (Signed)
lmtcb

## 2017-03-17 NOTE — Telephone Encounter (Signed)
-----   Message from Margo Common, [place] sent at 03/16/2017  6:16 PM EST ----- No bacterial growth on urine culture. Proceed with referral to the urologist for asymptomatic hematuria.

## 2017-03-19 NOTE — Telephone Encounter (Signed)
Pt advised and agrees with treatment plan. 

## 2017-03-30 ENCOUNTER — Encounter: Payer: Self-pay | Admitting: Urology

## 2017-03-30 ENCOUNTER — Ambulatory Visit (INDEPENDENT_AMBULATORY_CARE_PROVIDER_SITE_OTHER): Payer: Medicare Other | Admitting: Urology

## 2017-03-30 ENCOUNTER — Other Ambulatory Visit
Admission: RE | Admit: 2017-03-30 | Discharge: 2017-03-30 | Disposition: A | Discharge status: Home / Self Care | Payer: Medicare Other | Source: Ambulatory Visit | Attending: Urology | Admitting: Urology

## 2017-03-30 ENCOUNTER — Other Ambulatory Visit: Payer: Self-pay

## 2017-03-30 VITALS — BP 153/91 | HR 74 | Ht 63.0 in | Wt 198.0 lb

## 2017-03-30 DIAGNOSIS — R3129 Other microscopic hematuria: Secondary | ICD-10-CM

## 2017-03-30 DIAGNOSIS — R310 Gross hematuria: Secondary | ICD-10-CM | POA: Diagnosis not present

## 2017-03-30 LAB — URINALYSIS, COMPLETE (UACMP) WITH MICROSCOPIC
BILIRUBIN URINE: NEGATIVE
GLUCOSE, UA: NEGATIVE mg/dL
KETONES UR: NEGATIVE mg/dL
NITRITE: NEGATIVE
PH: 7 (ref 5.0–8.0)
Protein, ur: NEGATIVE mg/dL
Specific Gravity, Urine: 1.015 (ref 1.005–1.030)

## 2017-03-30 NOTE — Patient Instructions (Signed)
Cystoscopy  Cystoscopy is a procedure that is used to help diagnose and sometimes treat conditions that affect that lower urinary tract. The lower urinary tract includes the bladder and the tube that drains urine from the bladder out of the body (urethra). Cystoscopy is performed with a thin, tube-shaped instrument with a light and camera at the end (cystoscope). The cystoscope may be hard (rigid) or flexible, depending on the goal of the procedure.The cystoscope is inserted through the urethra, into the bladder.  Cystoscopy may be recommended if you have:   Urinary tractinfections that keep coming back (recurring).   Blood in the urine (hematuria).   Loss of bladder control (urinary incontinence) or an overactive bladder.   Unusual cells found in a urine sample.   A blockage in the urethra.   Painful urination.   An abnormality in the bladder found during an intravenous pyelogram (IVP) or CT scan.    Cystoscopy may also be done to remove a sample of tissue to be examined under a microscope (biopsy).  Tell a health care provider about:   Any allergies you have.   All medicines you are taking, including vitamins, herbs, eye drops, creams, and over-the-counter medicines.   Any problems you or family members have had with anesthetic medicines.   Any blood disorders you have.   Any surgeries you have had.   Any medical conditions you have.   Whether you are pregnant or may be pregnant.  What are the risks?  Generally, this is a safe procedure. However, problems may occur, including:   Infection.   Bleeding.   Allergic reactions to medicines.   Damage to other structures or organs.    What happens before the procedure?   Ask your health care provider about:  ? Changing or stopping your regular medicines. This is especially important if you are taking diabetes medicines or blood thinners.  ? Taking medicines such as aspirin and ibuprofen. These medicines can thin your blood. Do not take these medicines  before your procedure if your health care provider instructs you not to.   Follow instructions from your health care provider about eating or drinking restrictions.   You may be given antibiotic medicine to help prevent infection.   You may have an exam or testing, such as X-rays of the bladder, urethra, or kidneys.   You may have urine tests to check for signs of infection.   Plan to have someone take you home after the procedure.  What happens during the procedure?   To reduce your risk of infection,your health care team will wash or sanitize their hands.   You will be given one or more of the following:  ? A medicine to help you relax (sedative).  ? A medicine to numb the area (local anesthetic).   The area around the opening of your urethra will be cleaned.   The cystoscope will be passed through your urethra into your bladder.   Germ-free (sterile)fluid will flow through the cystoscope to fill your bladder. The fluid will stretch your bladder so that your surgeon can clearly examine your bladder walls.   The cystoscope will be removed and your bladder will be emptied.  The procedure may vary among health care providers and hospitals.  What happens after the procedure?   You may have some soreness or pain in your abdomen and urethra. Medicines will be available to help you.   You may have some blood in your urine.   Do not   drive for 24 hours if you received a sedative.  This information is not intended to replace advice given to you by your health care provider. Make sure you discuss any questions you have with your health care provider.  Document Released: 02/25/2000 Document Revised: 07/08/2015 Document Reviewed: 01/14/2015  Elsevier Interactive Patient Education  2018 [employer]

## 2017-03-30 NOTE — Progress Notes (Signed)
03/30/2017 2:31 PM   Elizabeth Trujillo 10/30/1935 [id number]  Referring provider: [name]., MD [address] [place], [place] [address]  Chief Complaint  Patient presents with  . Hematuria    New Patient    HPI: 82 yo F with presents today for further evaluation of gross hematuria.  She notes that on Dec 21st, she developed gross hematuria x 2 not associated with any other urinary  symptoms.  She was seen and evaluated by her primary care physician who felt that this may be related to an infectious etiology.  She was treated with Cipro for 3 days.  Her symptoms recurred just after New Year's.  She had 2 episodes of painless gross hematuria, one slightly more significant with maroon colored urine without clots.  Again, she had no other associated symptoms including no fever, chills, flank pain, urgency, dysuria.  Urine culture again was negative with a somewhat suspicious appearing UA.  She was treated this time with Cipro for 7 days.  She has rare and infrequent urinary tract infections.  She reports that whenever she is told her treated for an infection, she is never symptomatic.  She denies a previous history of gross hematuria prior to last month.  No personal history of kidney stones.    Never smoker.    Personal history of breast cancer.    Her mother has a history of bladder cancer and breast cancer.     PMH: Past Medical History:  Diagnosis Date  . Arthritis   . Breast cancer ([facility]) 1986   left mastectomy  . Cancer ([facility]) 1986   left breast  . Elevated triglycerides with high cholesterol   . GERD (gastroesophageal reflux disease)    history of reflux  . Hypercholesterolemia   . Hypertension     Surgical History: Past Surgical History:  Procedure Laterality Date  . ABDOMINAL HYSTERECTOMY  [id number]   TAH/BSO  . BREAST SURGERY Left 07/1984   Mastectomy  . CATARACT EXTRACTION W/ INTRAOCULAR LENS IMPLANT Left 2015  . EYE SURGERY Left  2013  . FOOT SURGERY Left 04/2013  . KNEE ARTHROPLASTY Right 11/03/2015   Procedure: COMPUTER ASSISTED TOTAL KNEE ARTHROPLASTY;  Surgeon: Dereck Leep, MD;  Location: [facility] ORS;  Service: Orthopedics;  Laterality: Right;  . MASTECTOMY Left 1986   positive    Home Medications:  Allergies as of 03/30/2017      Reactions   Penicillin V    Penicillin V Potassium Hives   Has patient had a PCN reaction causing immediate rash, facial/tongue/throat swelling, SOB or lightheadedness with hypotension: no Has patient had a PCN reaction causing severe rash involving mucus membranes or skin necrosis: {no Has patient had a PCN reaction that required hospitalization no Has patient had a PCN reaction occurring within the last 10 years: no If all of the above answers are "NO", then may proceed with Cephalosporin use.   Sulfa Antibiotics Hives      Medication List        Accurate as of 03/30/17 11:59 PM. Always use your most recent med list.          aspirin 81 MG tablet Take 81 mg by mouth daily.   CALCIUM-VITAMIN D PO Take 2 tablets by mouth daily.   Cholecalciferol 1000 units capsule Take 1,000 Units by mouth at bedtime.   MELOXICAM PO Take by mouth daily.   MULTIPLE VITAMIN PO Take 1 tablet by mouth daily.   OMEGA-3 FATTY ACIDS  PO Take 1 capsule by mouth daily.   rosuvastatin 20 MG tablet Commonly known as:  CRESTOR Take 0.5 tablets (10 mg total) by mouth daily.       Allergies:  Allergies  Allergen Reactions  . Penicillin V   . Penicillin V Potassium Hives    Has patient had a PCN reaction causing immediate rash, facial/tongue/throat swelling, SOB or lightheadedness with hypotension: no Has patient had a PCN reaction causing severe rash involving mucus membranes or skin necrosis: {no Has patient had a PCN reaction that required hospitalization no Has patient had a PCN reaction occurring within the last 10 years: no If all of the above answers are "NO", then may proceed  with Cephalosporin use.  . Sulfa Antibiotics Hives    Family History: Family History  Problem Relation Age of Onset  . Cancer Mother        baldder and breast  . Glaucoma Mother   . Kidney disease Mother   . Arthritis Mother   . Stroke Mother   . Breast cancer Mother 26  . Heart disease Father   . Cancer Brother        lung cancer, smoker  . Breast cancer Cousin        mat cousin    Social History:  reports that  has never smoked. she has never used smokeless tobacco. She reports that she does not drink alcohol or use drugs.  ROS: UROLOGY Frequent Urination?: No Hard to postpone urination?: No Burning/pain with urination?: No Get up at night to urinate?: Yes Leakage of urine?: No Urine stream starts and stops?: No Trouble starting stream?: No Do you have to strain to urinate?: No Blood in urine?: Yes Urinary tract infection?: Yes Sexually transmitted disease?: No Injury to kidneys or bladder?: No Painful intercourse?: No Weak stream?: No Currently pregnant?: No Vaginal bleeding?: No Last menstrual period?: nn  Gastrointestinal Nausea?: No Vomiting?: No Indigestion/heartburn?: No Diarrhea?: No Constipation?: No  Constitutional Fever: No Night sweats?: No Weight loss?: No Fatigue?: No  Skin Skin rash/lesions?: Yes Itching?: Yes  Eyes Blurred vision?: No Double vision?: No  Ears/Nose/Throat Sore throat?: No Sinus problems?: No  Hematologic/Lymphatic Swollen glands?: No Easy bruising?: No  Cardiovascular Leg swelling?: No Chest pain?: No  Respiratory Cough?: No Shortness of breath?: No  Endocrine Excessive thirst?: No  Musculoskeletal Back pain?: No Joint pain?: Yes  Neurological Headaches?: No Dizziness?: No  Psychologic Depression?: No Anxiety?: No  Physical Exam: BP (!) 153/91   Pulse 74   Ht 5\' 3"  (1.6 m)   Wt 198 lb (89.8 kg)   BMI 35.07 kg/m   Constitutional:  Alert and oriented, No acute distress.  Accompanied by  husband today. HEENT: [place] AT, moist mucus membranes.  Trachea midline, no masses. Cardiovascular: No clubbing, cyanosis, or edema. Respiratory: Normal respiratory effort, no increased work of breathing. GI: Abdomen is soft, nontender, nondistended, no abdominal masses GU: No CVA tenderness.  Skin: No rashes, bruises or suspicious lesions. Neurologic: Grossly intact, no focal deficits, moving all 4 extremities. Psychiatric: Normal mood and affect.  Laboratory Data: Lab Results  Component Value Date   WBC 9.6 04/04/2016   HGB 14.2 04/04/2016   HCT 41.7 04/04/2016   MCV 86 04/04/2016   PLT 351 04/04/2016    Lab Results  Component Value Date   CREATININE 0.69 04/04/2016    Urinalysis Component     Latest Ref Rng & Units 03/30/2017  Color, Urine     YELLOW YELLOW  Appearance     CLEAR HAZY (A)  Glucose     NEGATIVE mg/dL NEGATIVE  Bilirubin Urine     NEGATIVE NEGATIVE  Ketones, ur     NEGATIVE mg/dL NEGATIVE  Specific Gravity, Urine     1.005 - 1.030 1.015  Hgb urine dipstick     NEGATIVE SMALL (A)  pH     5.0 - 8.0 7.0  Protein     NEGATIVE mg/dL NEGATIVE  Nitrite     NEGATIVE NEGATIVE  Leukocytes, UA     NEGATIVE LARGE (A)  RBC / HPF     0 - 5 RBC/hpf 6-30  WBC, UA     0 - 5 WBC/hpf TOO NUMEROUS TO COUNT  Bacteria, UA     NONE SEEN MANY (A)  Squamous Epithelial / LPF     NONE SEEN TOO NUMEROUS TO COUNT (A)   Additional UA/urine cultures reviewed over the past month.  Pertinent Imaging: N/a  Assessment & Plan:    1. Gross hematuria We discussed the differential diagnosis for microscopic hematuria including nephrolithiasis, renal or upper tract tumors, bladder stones, UTIs, or bladder tumors as well as undetermined etiologies. Per AUA guidelines, I did recommend complete microscopic hematuria evaluation including CTU, possible urine cytology, and office cystoscopy.  Based on her urinalysis, I am somewhat suspicious that the samples are contaminated.   Would like to repeat her UA at the next visit specimen prior to her cystoscopy to test this theory.  Repeat urine culture today suspicious appearing urine although appears to be contaminated with multiple squamous epithelial cells.   - CULTURE, URINE COMPREHENSIVE; Future - CT HEMATURIA WORKUP; Future    Return in about 4 weeks (around 04/27/2017) for cysto/ CT urogram (cathed UA on day of visit) .  Hollice Espy, MD  [facility] [address], [place], [place] [address] [phone]

## 2017-04-01 LAB — URINE CULTURE

## 2017-04-04 ENCOUNTER — Telehealth: Payer: Self-pay | Admitting: Urology

## 2017-04-04 NOTE — Telephone Encounter (Signed)
Pt called office to give a couple more medications added to her chart/list and also has a question about CT scan she is to have in Feb. Please advise. Thanks.

## 2017-04-06 NOTE — Telephone Encounter (Signed)
LMOM

## 2017-04-24 ENCOUNTER — Ambulatory Visit (INDEPENDENT_AMBULATORY_CARE_PROVIDER_SITE_OTHER): Payer: Medicare Other

## 2017-04-24 VITALS — BP 134/74 | HR 96 | Temp 98.0°F | Ht 63.0 in | Wt 210.2 lb

## 2017-04-24 DIAGNOSIS — Z0000 Encounter for general adult medical examination without abnormal findings: Secondary | ICD-10-CM

## 2017-04-24 NOTE — Patient Instructions (Addendum)
Elizabeth Trujillo , Thank you for taking time to come for your Medicare Wellness Visit. I appreciate your ongoing commitment to your health goals. Please review the following plan we discussed and let me know if I can assist you in the future.   Screening recommendations/referrals: Colonoscopy: N/A Mammogram: Up to date Bone Density: Up to date Recommended yearly ophthalmology/optometry visit for glaucoma screening and checkup Recommended yearly dental visit for hygiene and checkup  Vaccinations: Influenza vaccine: Up to date Pneumococcal vaccine: Up to date Tdap vaccine: Pt declines today.  Shingles vaccine: Pt declines today.     Advanced directives: Please bring a copy of your POA (Power of Attorney) and/or Living Will to your next appointment.   Conditions/risks identified: Fall risk prevention; Obesity; Recommend increasing water intake to 4-6 glasses a day.   Next appointment: 05/01/17 @ 2:00 PM   Preventive Care 65 Years and Older, Female Preventive care refers to lifestyle choices and visits with your health care provider that can promote health and wellness. What does preventive care include?  A yearly physical exam. This is also called an annual well check.  Dental exams once or twice a year.  Routine eye exams. Ask your health care provider how often you should have your eyes checked.  Personal lifestyle choices, including:  Daily care of your teeth and gums.  Regular physical activity.  Eating a healthy diet.  Avoiding tobacco and drug use.  Limiting alcohol use.  Practicing safe sex.  Taking low-dose aspirin every day.  Taking vitamin and mineral supplements as recommended by your health care provider. What happens during an annual well check? The services and screenings done by your health care provider during your annual well check will depend on your age, overall health, lifestyle risk factors, and family history of disease. Counseling  Your health care  provider may ask you questions about your:  Alcohol use.  Tobacco use.  Drug use.  Emotional well-being.  Home and relationship well-being.  Sexual activity.  Eating habits.  History of falls.  Memory and ability to understand (cognition).  Work and work Statistician.  Reproductive health. Screening  You may have the following tests or measurements:  Height, weight, and BMI.  Blood pressure.  Lipid and cholesterol levels. These may be checked every 5 years, or more frequently if you are over 62 years old.  Skin check.  Lung cancer screening. You may have this screening every year starting at age 64 if you have a 30-pack-year history of smoking and currently smoke or have quit within the past 15 years.  Fecal occult blood test (FOBT) of the stool. You may have this test every year starting at age 48.  Flexible sigmoidoscopy or colonoscopy. You may have a sigmoidoscopy every 5 years or a colonoscopy every 10 years starting at age 64.  Hepatitis C blood test.  Hepatitis B blood test.  Sexually transmitted disease (STD) testing.  Diabetes screening. This is done by checking your blood sugar (glucose) after you have not eaten for a while (fasting). You may have this done every 1-3 years.  Bone density scan. This is done to screen for osteoporosis. You may have this done starting at age 66.  Mammogram. This may be done every 1-2 years. Talk to your health care provider about how often you should have regular mammograms. Talk with your health care provider about your test results, treatment options, and if necessary, the need for more tests. Vaccines  Your health care provider may recommend  certain vaccines, such as:  Influenza vaccine. This is recommended every year.  Tetanus, diphtheria, and acellular pertussis (Tdap, Td) vaccine. You may need a Td booster every 10 years.  Zoster vaccine. You may need this after age 23.  Pneumococcal 13-valent conjugate (PCV13)  vaccine. One dose is recommended after age 70.  Pneumococcal polysaccharide (PPSV23) vaccine. One dose is recommended after age 30. Talk to your health care provider about which screenings and vaccines you need and how often you need them. This information is not intended to replace advice given to you by your health care provider. Make sure you discuss any questions you have with your health care provider. Document Released: 03/26/2015 Document Revised: 11/17/2015 Document Reviewed: 12/29/2014 Elsevier Interactive Patient Education  2017 [place] Prevention in the Home Falls can cause injuries. They can happen to people of all ages. There are many things you can do to make your home safe and to help prevent falls. What can I do on the outside of my home?  Regularly fix the edges of walkways and driveways and fix any cracks.  Remove anything that might make you trip as you walk through a door, such as a raised step or threshold.  Trim any bushes or trees on the path to your home.  Use bright outdoor lighting.  Clear any walking paths of anything that might make someone trip, such as rocks or tools.  Regularly check to see if handrails are loose or broken. Make sure that both sides of any steps have handrails.  Any raised decks and porches should have guardrails on the edges.  Have any leaves, snow, or ice cleared regularly.  Use sand or salt on walking paths during winter.  Clean up any spills in your garage right away. This includes oil or grease spills. What can I do in the bathroom?  Use night lights.  Install grab bars by the toilet and in the tub and shower. Do not use towel bars as grab bars.  Use non-skid mats or decals in the tub or shower.  If you need to sit down in the shower, use a plastic, non-slip stool.  Keep the floor dry. Clean up any water that spills on the floor as soon as it happens.  Remove soap buildup in the tub or shower  regularly.  Attach bath mats securely with double-sided non-slip rug tape.  Do not have throw rugs and other things on the floor that can make you trip. What can I do in the bedroom?  Use night lights.  Make sure that you have a light by your bed that is easy to reach.  Do not use any sheets or blankets that are too big for your bed. They should not hang down onto the floor.  Have a firm chair that has side arms. You can use this for support while you get dressed.  Do not have throw rugs and other things on the floor that can make you trip. What can I do in the kitchen?  Clean up any spills right away.  Avoid walking on wet floors.  Keep items that you use a lot in easy-to-reach places.  If you need to reach something above you, use a strong step stool that has a grab bar.  Keep electrical cords out of the way.  Do not use floor polish or wax that makes floors slippery. If you must use wax, use non-skid floor wax.  Do not have throw rugs and other  things on the floor that can make you trip. What can I do with my stairs?  Do not leave any items on the stairs.  Make sure that there are handrails on both sides of the stairs and use them. Fix handrails that are broken or loose. Make sure that handrails are as long as the stairways.  Check any carpeting to make sure that it is firmly attached to the stairs. Fix any carpet that is loose or worn.  Avoid having throw rugs at the top or bottom of the stairs. If you do have throw rugs, attach them to the floor with carpet tape.  Make sure that you have a light switch at the top of the stairs and the bottom of the stairs. If you do not have them, ask someone to add them for you. What else can I do to help prevent falls?  Wear shoes that:  Do not have high heels.  Have rubber bottoms.  Are comfortable and fit you well.  Are closed at the toe. Do not wear sandals.  If you use a stepladder:  Make sure that it is fully  opened. Do not climb a closed stepladder.  Make sure that both sides of the stepladder are locked into place.  Ask someone to hold it for you, if possible.  Clearly mark and make sure that you can see:  Any grab bars or handrails.  First and last steps.  Where the edge of each step is.  Use tools that help you move around (mobility aids) if they are needed. These include:  Canes.  Walkers.  Scooters.  Crutches.  Turn on the lights when you go into a dark area. Replace any light bulbs as soon as they burn out.  Set up your furniture so you have a clear path. Avoid moving your furniture around.  If any of your floors are uneven, fix them.  If there are any pets around you, be aware of where they are.  Review your medicines with your doctor. Some medicines can make you feel dizzy. This can increase your chance of falling. Ask your doctor what other things that you can do to help prevent falls. This information is not intended to replace advice given to you by your health care provider. Make sure you discuss any questions you have with your health care provider. Document Released: 12/24/2008 Document Revised: 08/05/2015 Document Reviewed: 04/03/2014 Elsevier Interactive Patient Education  2017 [employer].

## 2017-04-24 NOTE — Progress Notes (Signed)
Subjective:   Elizabeth Trujillo is a 82 y.o. female who presents for Medicare Annual (Subsequent) preventive examination.  Review of Systems:  N/A  Cardiac Risk Factors include: advanced age (>84men, >52 women);obesity (BMI >30kg/m2);dyslipidemia;hypertension     Objective:     Vitals: BP 134/74 (BP Location: Right Arm)   Pulse 96   Temp 98 F (36.7 C) (Oral)   Ht 5\' 3"  (1.6 m)   Wt 210 lb 3.2 oz (95.3 kg)   BMI 37.24 kg/m   Body mass index is 37.24 kg/m.  Advanced Directives 04/24/2017 04/04/2016 11/03/2015 10/20/2015 01/14/2015  Does Patient Have a Medical Advance Directive? Yes Yes Yes Yes Yes  Type of Paramedic of [place];Living will [place];Living will [place];Living will [place];Living will -  Does patient want to make changes to medical advance directive? - - No - Patient declined - -  Copy of [place] in Chart? No - copy requested No - copy requested No - copy requested No - copy requested -    Tobacco Social History   Tobacco Use  Smoking Status Never Smoker  Smokeless Tobacco Never Used     Counseling given: Not Answered   Clinical Intake:  Pre-visit preparation completed: Yes  Pain : No/denies pain Pain Score: 0-No pain     Nutritional Status: BMI > 30  Obese Nutritional Risks: None Diabetes: No  How often do you need to have someone help you when you read instructions, pamphlets, or other written materials from your doctor or pharmacy?: 1 - Never  Interpreter Needed?: No  Information entered by :: [facility], LPN  Past Medical History:  Diagnosis Date  . Arthritis   . Breast cancer ([place]) 1986   left mastectomy  . Cancer ([place]) 1986   left breast  . Elevated triglycerides with high cholesterol   . GERD (gastroesophageal reflux disease)    history of reflux  . Hypercholesterolemia   . Hypertension    Past Surgical History:  Procedure  Laterality Date  . ABDOMINAL HYSTERECTOMY  [id number]   TAH/BSO  . BREAST SURGERY Left 07/1984   Mastectomy  . CATARACT EXTRACTION W/ INTRAOCULAR LENS IMPLANT Left 2015  . EYE SURGERY Left 2013  . FOOT SURGERY Left 04/2013  . KNEE ARTHROPLASTY Right 11/03/2015   Procedure: COMPUTER ASSISTED TOTAL KNEE ARTHROPLASTY;  Surgeon: Dereck Leep, MD;  Location: [facility] ORS;  Service: Orthopedics;  Laterality: Right;  . MASTECTOMY Left 1986   positive   Family History  Problem Relation Age of Onset  . Cancer Mother        baldder and breast  . Glaucoma Mother   . Kidney disease Mother   . Arthritis Mother   . Stroke Mother   . Breast cancer Mother 45  . Heart disease Father   . Cancer Brother        lung cancer, smoker  . Breast cancer Cousin        mat cousin   Social History   Socioeconomic History  . Marital status: Married    Spouse name: None  . Number of children: 3  . Years of education: None  . Highest education level: Bachelor's degree (e.g., BA, AB, BS)  Social Needs  . Financial resource strain: Not hard at all  . Food insecurity - worry: Never true  . Food insecurity - inability: Never true  . Transportation needs - medical: No  . Transportation needs -  non-medical: No  Occupational History  . Occupation: retired  Tobacco Use  . Smoking status: Never Smoker  . Smokeless tobacco: Never Used  Substance and Sexual Activity  . Alcohol use: No  . Drug use: No  . Sexual activity: None  Other Topics Concern  . None  Social History Narrative  . None    Outpatient Encounter Medications as of 04/24/2017  Medication Sig  . aspirin 81 MG tablet Take 81 mg by mouth daily.   Marland Kitchen CALCIUM-VITAMIN D PO Take 2 tablets by mouth daily.   . Cholecalciferol 1000 UNITS capsule Take 1,000 Units by mouth at bedtime.   . MULTIPLE VITAMIN PO Take 1 tablet by mouth daily.   . OMEGA-3 FATTY ACIDS PO Take 1 capsule by mouth daily.   . rosuvastatin (CRESTOR) 20 MG tablet Take 0.5 tablets  (10 mg total) by mouth daily.  . [DISCONTINUED] MELOXICAM PO Take by mouth daily.   No facility-administered encounter medications on file as of 04/24/2017.     Activities of Daily Living In your present state of health, do you have any difficulty performing the following activities: 04/24/2017  Hearing? Y  Comment Wears bilateral hearing aids.  Vision? N  Difficulty concentrating or making decisions? Y  Walking or climbing stairs? Y  Comment Due to pain from arthritis.  Dressing or bathing? N  Doing errands, shopping? N  Preparing Food and eating ? N  Using the Toilet? N  In the past six months, have you accidently leaked urine? N  Do you have problems with loss of bowel control? N  Managing your Medications? N  Managing your Finances? N  Housekeeping or managing your Housekeeping? N  Some recent data might be hidden    Patient Care Team: [name]., MD as PCP - General (Family Medicine) Birder Robson, MD as Referring Physician (Ophthalmology) Hollice Espy, MD as Consulting Physician (Urology) Reche Dixon, PA-C as Consulting Physician (Orthopedic Surgery)    Assessment:   This is a routine wellness examination for Elizabeth Trujillo.  Exercise Activities and Dietary recommendations Current Exercise Habits: The patient does not participate in regular exercise at present, Exercise limited by: None identified  Goals    . DIET - INCREASE WATER INTAKE     Recommend increasing water intake to 4-6 glasses a day.        Fall Risk Fall Risk  04/24/2017 04/04/2016 01/14/2015 01/14/2015  Falls in the past year? Yes No No No  Number falls in past yr: 1 - - -  Injury with Fall? No - - -   Is the patient's home free of loose throw rugs in walkways, pet beds, electrical cords, etc?   yes      Grab bars in the bathroom? yes      Handrails on the stairs?   yes      Adequate lighting?   yes  Timed Get Up and Go performed: N/A  Depression Screen PHQ 2/9 Scores 04/24/2017  04/04/2016 01/14/2015 01/14/2015  PHQ - 2 Score 0 0 0 0     Cognitive Function     6CIT Screen 04/24/2017 04/04/2016  What Year? 0 points 0 points  What month? 0 points 0 points  What time? 0 points 0 points  Count back from 20 0 points 0 points  Months in reverse 0 points 0 points  Repeat phrase 0 points 0 points  Total Score 0 0    Immunization History  Administered Date(s) Administered  . Influenza, High  Dose Seasonal PF 01/14/2015, 12/05/2016  . Pneumococcal Conjugate-13 09/08/2013  . Pneumococcal Polysaccharide-23 01/04/2011    Qualifies for Shingles Vaccine? Due for Shingles vaccine. Declined my offer to administer today. Education has been provided regarding the importance of this vaccine. Pt has been advised to call her insurance company to determine her out of pocket expense. Advised she may also receive this vaccine at her local pharmacy or Health Dept. Verbalized acceptance and understanding.  Screening Tests Health Maintenance  Topic Date Due  . TETANUS/TDAP  06/06/1954  . INFLUENZA VACCINE  Completed  . DEXA SCAN  Completed  . PNA vac Low Risk Adult  Completed    Cancer Screenings: Lung: Low Dose CT Chest recommended if Age 29-80 years, 30 pack-year currently smoking OR have quit w/in 15years. Patient does not qualify. Breast:  Up to date on Mammogram? Yes   Up to date of Bone Density/Dexa? Yes Colorectal: N/A   Additional Screenings:  Hepatitis B/HIV/Syphillis: Pt declines today.  Hepatitis C Screening: Pt declines today.      Plan:  I have personally reviewed and addressed the Medicare Annual Wellness questionnaire and have noted the following in the patient's chart:  A. Medical and social history B. Use of alcohol, tobacco or illicit drugs  C. Current medications and supplements D. Functional ability and status E.  Nutritional status F.  Physical activity G. Advance directives H. List of other physicians I.  Hospitalizations, surgeries, and ER visits  in previous 12 months J.  [place] such as hearing and vision if needed, cognitive and depression L. Referrals and appointments - none  In addition, I have reviewed and discussed with patient certain preventive protocols, quality metrics, and best practice recommendations. A written personalized care plan for preventive services as well as general preventive health recommendations were provided to patient.  See attached scanned questionnaire for additional information.   Signed,  Fabio Neighbors, LPN Nurse Health Advisor   Nurse Recommendations: Pt declined the tetanus vaccine today.

## 2017-04-25 ENCOUNTER — Ambulatory Visit: Payer: Medicare Other

## 2017-04-25 ENCOUNTER — Ambulatory Visit
Admission: RE | Admit: 2017-04-25 | Discharge: 2017-04-25 | Disposition: A | Discharge status: Home / Self Care | Payer: Medicare Other | Source: Ambulatory Visit | Attending: Urology | Admitting: Urology

## 2017-04-25 DIAGNOSIS — N390 Urinary tract infection, site not specified: Secondary | ICD-10-CM | POA: Diagnosis not present

## 2017-04-25 DIAGNOSIS — R310 Gross hematuria: Secondary | ICD-10-CM

## 2017-04-25 DIAGNOSIS — K5730 Diverticulosis of large intestine without perforation or abscess without bleeding: Secondary | ICD-10-CM | POA: Insufficient documentation

## 2017-04-25 LAB — POCT I-STAT CREATININE: Creatinine, Ser: 0.7 mg/dL (ref 0.44–1.00)

## 2017-04-25 MED ORDER — IOPAMIDOL (ISOVUE-300) INJECTION 61%
125.0000 mL | Freq: Once | INTRAVENOUS | Status: AC | PRN
  Administered 2017-04-25: 150 mL via INTRAVENOUS

## 2017-04-26 ENCOUNTER — Other Ambulatory Visit: Payer: Self-pay | Admitting: Family Medicine

## 2017-04-26 DIAGNOSIS — Z1231 Encounter for screening mammogram for malignant neoplasm of breast: Secondary | ICD-10-CM

## 2017-04-27 ENCOUNTER — Encounter: Payer: Self-pay | Admitting: Urology

## 2017-04-27 ENCOUNTER — Ambulatory Visit (INDEPENDENT_AMBULATORY_CARE_PROVIDER_SITE_OTHER): Payer: Medicare Other | Admitting: Urology

## 2017-04-27 ENCOUNTER — Other Ambulatory Visit
Admission: RE | Admit: 2017-04-27 | Discharge: 2017-04-27 | Disposition: A | Discharge status: Home / Self Care | Payer: Medicare Other | Source: Ambulatory Visit | Attending: Urology | Admitting: Urology

## 2017-04-27 ENCOUNTER — Other Ambulatory Visit: Payer: Self-pay | Admitting: Family Medicine

## 2017-04-27 VITALS — BP 162/86 | HR 74 | Ht 63.0 in | Wt 198.0 lb

## 2017-04-27 DIAGNOSIS — R310 Gross hematuria: Secondary | ICD-10-CM

## 2017-04-27 DIAGNOSIS — N390 Urinary tract infection, site not specified: Secondary | ICD-10-CM | POA: Diagnosis not present

## 2017-04-27 LAB — URINALYSIS, COMPLETE (UACMP) WITH MICROSCOPIC
BILIRUBIN URINE: NEGATIVE
Glucose, UA: NEGATIVE mg/dL
Ketones, ur: NEGATIVE mg/dL
Nitrite: NEGATIVE
Protein, ur: NEGATIVE mg/dL
Specific Gravity, Urine: 1.015 (ref 1.005–1.030)
pH: 7 (ref 5.0–8.0)

## 2017-04-27 MED ORDER — FLUCONAZOLE 150 MG PO TABS: 150.0000 mg | ORAL_TABLET | Freq: Once | ORAL | 0 refills | Status: AC

## 2017-04-27 MED ORDER — CIPROFLOXACIN HCL 500 MG PO TABS
500.0000 mg | ORAL_TABLET | Freq: Once | ORAL | Status: AC
  Administered 2017-04-27: 500 mg via ORAL

## 2017-04-27 MED ORDER — LIDOCAINE HCL 2 % EX GEL
1.0000 "application " | Freq: Once | CUTANEOUS | Status: AC
  Administered 2017-04-27: 1 via URETHRAL

## 2017-04-29 LAB — URINE CULTURE: Culture: NO GROWTH

## 2017-04-29 NOTE — Progress Notes (Signed)
   04/27/2017  CC:  Chief Complaint  Patient presents with  . Cysto    HPI: 82 year old female with several episodes of painless gross hematuria who returns to the office today for cystoscopy.  Since her last visit, she is undergone CT urogram on 04/25/2017.  This was personally reviewed today and no GU abnormalities were identified.  She does report that she has been dealing with a candidal rash on her abdomen and is taken a month of Diflucan as well as creams and powders as prescribed by dermatology.  Blood pressure (!) 162/86, pulse 74, height 5\' 3"  (1.6 m), weight 198 lb (89.8 kg). NED. A&Ox3.   No respiratory distress   Abd soft, NT, ND Excoriated erythematous external genitalia extending into the inguinal floor most consistent with candidal infection.  Normal vaginal mucosa and urethral meatus with small urethral caruncle at 6 o'clock position.  Cystoscopy Procedure Note  Patient identification was confirmed, informed consent was obtained, and patient was prepped using Betadine solution.  Lidocaine jelly was administered per urethral meatus.    Preoperative abx where received prior to procedure.    Procedure: - Flexible cystoscope introduced, without any difficulty.   - Thorough search of the bladder revealed:    normal urethral meatus    normal urothelium    no stones    no ulcers     no tumors    no urethral polyps    no trabeculation  - Ureteral orifices were normal in position and appearance.  Post-Procedure: - Patient tolerated the procedure well  Assessment/ Plan:  1. Gross hematuria Etiology of gross hematuria unclear No pathology on cystoscopy or CT urogram ?  Related to severe groin candidiasis and/ or caruncle Advised to use nystatin cream that the patient already has her vagina and labia minora as well as nystatin powder exteriorly.  Will prescribe an additional one-time dose of oral Diflucan to help resolve her candidal infection. Advised to  follow-up with either PCP or GYN if infection fails to resolve  2. Recurrent UTI Suspect previous urinalysis were contaminated Catheterized urine culture today to rule out underlying infection although not suspected - Urine Culture; Future - ciprofloxacin (CIPRO) tablet 500 mg - lidocaine (XYLOCAINE) 2 % jelly 1 application  F/u as needed  Hollice Espy, MD

## 2017-05-01 ENCOUNTER — Encounter: Payer: Self-pay | Admitting: Family Medicine

## 2017-05-01 ENCOUNTER — Ambulatory Visit (INDEPENDENT_AMBULATORY_CARE_PROVIDER_SITE_OTHER): Payer: Medicare Other | Admitting: Family Medicine

## 2017-05-01 VITALS — BP 126/72 | HR 88 | Temp 97.6°F | Resp 16 | Wt 211.0 lb

## 2017-05-01 DIAGNOSIS — R9082 White matter disease, unspecified: Secondary | ICD-10-CM | POA: Diagnosis not present

## 2017-05-01 DIAGNOSIS — R5383 Other fatigue: Secondary | ICD-10-CM

## 2017-05-01 DIAGNOSIS — B373 Candidiasis of vulva and vagina: Secondary | ICD-10-CM | POA: Diagnosis not present

## 2017-05-01 DIAGNOSIS — R410 Disorientation, unspecified: Secondary | ICD-10-CM | POA: Diagnosis not present

## 2017-05-01 DIAGNOSIS — Z170 Estrogen receptor positive status [ER+]: Secondary | ICD-10-CM | POA: Diagnosis not present

## 2017-05-01 DIAGNOSIS — E7800 Pure hypercholesterolemia, unspecified: Secondary | ICD-10-CM | POA: Diagnosis not present

## 2017-05-01 DIAGNOSIS — C50912 Malignant neoplasm of unspecified site of left female breast: Secondary | ICD-10-CM | POA: Diagnosis not present

## 2017-05-01 DIAGNOSIS — B3731 Acute candidiasis of vulva and vagina: Secondary | ICD-10-CM

## 2017-05-01 DIAGNOSIS — R404 Transient alteration of awareness: Secondary | ICD-10-CM | POA: Diagnosis not present

## 2017-05-01 MED ORDER — NYSTATIN 100000 UNIT/GM EX CREA: 1.0000 "application " | TOPICAL_CREAM | Freq: Two times a day (BID) | CUTANEOUS | 1 refills | Status: DC

## 2017-05-01 NOTE — Progress Notes (Signed)
[name] Female    DOB: [birth year]   82 y.o.   MRN: [id number] Visit Date: 05/01/2017  Today's Provider: [name], MD   Chief Complaint  Patient presents with  . Hypertension  . Hyperlipidemia   Subjective:    HPI  Colonoscopy- 09/29/04; 04/28/15 Colo guard Negative BMD- 04/28/15 normal Mammogram- 04/18/16 Benign S/p hysterectomy   Hypertension, follow-up:  BP Readings from Last 3 Encounters:  05/01/17 126/72  04/27/17 (!) 162/86  04/24/17 134/74    She was last seen for hypertension 5 months ago.  BP at that visit was . Management since that visit includes none. She reports good compliance with treatment. She is not having side effects.  She is not exercising.   Outside blood pressures are not being checked. Patient denies chest pain, chest pressure/discomfort, claudication, dyspnea, exertional chest pressure/discomfort, fatigue, irregular heart beat, lower extremity edema, near-syncope, orthopnea, palpitations, paroxysmal nocturnal dyspnea, syncope and tachypnea.   Wt Readings from Last 3 Encounters:  05/01/17 211 lb (95.7 kg)  04/27/17 198 lb (89.8 kg)  04/24/17 210 lb 3.2 oz (95.3 kg)   ------------------------------------------------------------------------  Lipid/Cholesterol, Follow-up:   Last seen for this 6 months ago.  Pt was off of her Crestor for a month and then went back on it. Pt was off of it because she was suppose to take Fluconazole due to an interaction. She has since been prescribed it again, but has not taken because she is taking the Crestor again. She read somewhere that the Crestor and fluconazole interact but the pharmacist said that they did not interact.  Last Lipid Panel:    Component Value Date/Time   CHOL 187 12/05/2016 1545   CHOL 201 (H) 04/04/2016 1515   TRIG 299 (H) 12/05/2016 1545   HDL 46 (L) 12/05/2016 1545   HDL 52 04/04/2016 1515   CHOLHDL 4.1 12/05/2016 1545   LDLCALC 118 (H) 04/04/2016 1515     Wt Readings from Last 3 Encounters:  05/01/17 211 lb (95.7 kg)  04/27/17 198 lb (89.8 kg)  04/24/17 210 lb 3.2 oz (95.3 kg)    -------------------------------------------------------------------     Allergies  Allergen Reactions  . Penicillin V   . Penicillin V Potassium Hives    Has patient had a PCN reaction causing immediate rash, facial/tongue/throat swelling, SOB or lightheadedness with hypotension: no Has patient had a PCN reaction causing severe rash involving mucus membranes or skin necrosis: {no Has patient had a PCN reaction that required hospitalization no Has patient had a PCN reaction occurring within the last 10 years: no If all of the above answers are "NO", then may proceed with Cephalosporin use.  . Sulfa Antibiotics Hives     Current Outpatient Medications:  .  aspirin 81 MG tablet, Take 81 mg by mouth daily. , Disp: , Rfl:  .  CALCIUM-VITAMIN D PO, Take 2 tablets by mouth daily. , Disp: , Rfl:  .  Cholecalciferol 1000 UNITS capsule, Take 1,000 Units by mouth at bedtime. , Disp: , Rfl:  .  MULTIPLE VITAMIN PO, Take 1 tablet by mouth daily. , Disp: , Rfl:  .  OMEGA-3 FATTY ACIDS PO, Take 1 capsule by mouth daily. , Disp: , Rfl:  .  rosuvastatin (CRESTOR) 20 MG tablet, Take 0.5 tablets (10 mg total) by mouth daily., Disp: 30 tablet, Rfl: 12  Review of Systems  Constitutional: Negative.   HENT: Negative.   Eyes: Negative.   Respiratory: Negative.  Cardiovascular: Negative.   Gastrointestinal: Negative.   Endocrine: Negative.   Genitourinary: Positive for hematuria (seeing urology for work up).  Musculoskeletal: Negative.   Skin: Negative.   Allergic/Immunologic: Negative.   Neurological: Negative.   Hematological: Negative.   Psychiatric/Behavioral: The patient is nervous/anxious.     Social History   Tobacco Use  . Smoking status: Never Smoker  . Smokeless tobacco: Never Used  Substance Use Topics  . Alcohol use: No   Objective:   BP  126/72 (BP Location: Left Arm, Patient Position: Sitting, Cuff Size: Large)   Pulse 88   Temp 97.6 F (36.4 C) (Oral)   Resp 16   Wt 211 lb (95.7 kg)   BMI 37.38 kg/m  Vitals:   05/01/17 1428  BP: 126/72  Pulse: 88  Resp: 16  Temp: 97.6 F (36.4 C)  TempSrc: Oral  Weight: 211 lb (95.7 kg)     Physical Exam  Constitutional: She is oriented to person, place, and time. She appears well-developed and well-nourished.  HENT:  Head: Normocephalic and atraumatic.  Right Ear: External ear normal.  Left Ear: External ear normal.  Nose: Nose normal.  Mouth/Throat: Oropharynx is clear and moist.  Eyes: Conjunctivae are normal. No scleral icterus.  Neck: No thyromegaly present.  Cardiovascular: Normal rate, regular rhythm and normal heart sounds.  Pulmonary/Chest: Effort normal and breath sounds normal.  Left breast surgically absent.  Abdominal: Soft.  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  Musculoskeletal: She exhibits no edema.  Neurological: She is alert and oriented to person, place, and time.  Skin: Skin is warm and dry.  Psychiatric: She has a normal mood and affect. Her behavior is normal. Judgment and thought content normal.        Assessment & Plan:      1. Hypercholesterolemia  - Lipid panel - Comprehensive metabolic panel  2. Malignant neoplasm of left breast in female, estrogen receptor positive, unspecified site of breast ([place])   3. Other fatigue  - CBC with Differential/Platelet - TSH  4. Yeast vaginitis I think she has been aggressively treated with 1 month oral diflucan and also topical. Defer to Gyn - Ambulatory referral to Gynecology - nystatin cream (MYCOSTATIN); Apply 1 application topically 2 (two) times daily.  Dispense: 30 g; Refill: 1 5.Hematuria I think this was due to yeast skin  changes.Negative w/u per urology.    HPI, Exam, and A&P Transcribed under the direction and in the presence of Serenity Batley L. Cranford Mon, MD  Electronically Signed: Katina Dung, [email or web address]   [name], MD  [facility]

## 2017-05-01 NOTE — Patient Instructions (Signed)
Get labs done the first week of March

## 2017-05-02 ENCOUNTER — Ambulatory Visit
Admission: RE | Admit: 2017-05-02 | Discharge: 2017-05-02 | Disposition: A | Discharge status: Home / Self Care | Payer: Medicare Other | Source: Ambulatory Visit | Attending: Family Medicine | Admitting: Family Medicine

## 2017-05-02 ENCOUNTER — Encounter (INDEPENDENT_AMBULATORY_CARE_PROVIDER_SITE_OTHER): Payer: Self-pay

## 2017-05-02 DIAGNOSIS — Z1231 Encounter for screening mammogram for malignant neoplasm of breast: Secondary | ICD-10-CM | POA: Diagnosis not present

## 2017-05-04 ENCOUNTER — Telehealth: Payer: Self-pay | Admitting: Obstetrics & Gynecology

## 2017-05-04 NOTE — Telephone Encounter (Signed)
[facility] referring for Yeast vaginitis. Left voicemail for patient to call back to be schedule

## 2017-05-08 ENCOUNTER — Ambulatory Visit (INDEPENDENT_AMBULATORY_CARE_PROVIDER_SITE_OTHER): Payer: Medicare Other | Admitting: Family Medicine

## 2017-05-08 ENCOUNTER — Encounter: Payer: Self-pay | Admitting: Family Medicine

## 2017-05-08 VITALS — BP 130/80 | HR 80 | Resp 16 | Wt 214.0 lb

## 2017-05-08 DIAGNOSIS — Z170 Estrogen receptor positive status [ER+]: Secondary | ICD-10-CM

## 2017-05-08 DIAGNOSIS — C50912 Malignant neoplasm of unspecified site of left female breast: Secondary | ICD-10-CM | POA: Diagnosis not present

## 2017-05-08 DIAGNOSIS — R600 Localized edema: Secondary | ICD-10-CM

## 2017-05-08 MED ORDER — HYDROCHLOROTHIAZIDE 25 MG PO TABS: 25.0000 mg | ORAL_TABLET | Freq: Every day | ORAL | 3 refills | Status: DC

## 2017-05-08 MED ORDER — CLOBETASOL PROPIONATE 0.05 % EX CREA: 1.0000 "application " | TOPICAL_CREAM | Freq: Two times a day (BID) | CUTANEOUS | 3 refills | Status: DC

## 2017-05-08 NOTE — Patient Instructions (Addendum)
Feet swelling Given HCTZ 25 mg qd. Will try Lasix it HCTZ doesn't work.   Compression socks mild/moderate.   Refilled clobetasol cream.   Follow-up for feet swelling in 2-3 weeks.

## 2017-05-08 NOTE — Progress Notes (Signed)
       [name] Female    DOB: 1936/01/29   82 y.o.   MRN: [id number] Visit Date: 05/08/2017  Today's Provider: [name], MD   No chief complaint on file.  Subjective:    Mild swelling both feet for 2 weeks. Does not extend above ankles.Non CP,SOB,DOE.       Allergies  Allergen Reactions  . Penicillin V   . Penicillin V Potassium Hives    Has patient had a PCN reaction causing immediate rash, facial/tongue/throat swelling, SOB or lightheadedness with hypotension: no Has patient had a PCN reaction causing severe rash involving mucus membranes or skin necrosis: {no Has patient had a PCN reaction that required hospitalization no Has patient had a PCN reaction occurring within the last 10 years: no If all of the above answers are "NO", then may proceed with Cephalosporin use.  . Sulfa Antibiotics Hives     Current Outpatient Medications:  .  aspirin 81 MG tablet, Take 81 mg by mouth daily. , Disp: , Rfl:  .  CALCIUM-VITAMIN D PO, Take 2 tablets by mouth daily. , Disp: , Rfl:  .  Cholecalciferol 1000 UNITS capsule, Take 1,000 Units by mouth at bedtime. , Disp: , Rfl:  .  MULTIPLE VITAMIN PO, Take 1 tablet by mouth daily. , Disp: , Rfl:  .  nystatin cream (MYCOSTATIN), Apply 1 application topically 2 (two) times daily., Disp: 30 g, Rfl: 1 .  OMEGA-3 FATTY ACIDS PO, Take 1 capsule by mouth daily. , Disp: , Rfl:  .  rosuvastatin (CRESTOR) 20 MG tablet, Take 0.5 tablets (10 mg total) by mouth daily., Disp: 30 tablet, Rfl: 12  Review of Systems  Constitutional: Negative for appetite change, chills, fatigue and fever.  HENT: Negative.   Respiratory: Negative for chest tightness and shortness of breath.   Cardiovascular: Negative for chest pain and palpitations.  Gastrointestinal: Negative for abdominal pain, nausea and vomiting.  Endocrine: Negative.   Neurological: Negative for dizziness and weakness.  Hematological: Negative.   Psychiatric/Behavioral:  Negative.     Social History   Tobacco Use  . Smoking status: Never Smoker  . Smokeless tobacco: Never Used  Substance Use Topics  . Alcohol use: No   Objective:   There were no vitals taken for this visit. There were no vitals filed for this visit.   Physical Exam  Constitutional: She is oriented to person, place, and time. She appears well-developed and well-nourished.  HENT:  Head: Normocephalic and atraumatic.  Eyes: Conjunctivae are normal. No scleral icterus.  Cardiovascular: Normal rate, regular rhythm and normal heart sounds.  Pulmonary/Chest: Effort normal and breath sounds normal.  Abdominal: Soft.  Musculoskeletal: She exhibits no edema.  Minimal swelling both feet.  Neurological: She is alert and oriented to person, place, and time.  Skin: Skin is warm and dry.  Psychiatric: She has a normal mood and affect. Her behavior is normal. Thought content normal.        Assessment & Plan:     Pedal Edema Try HCTZ Compression Hose. F/u 2-3 weeks. Breast Cancer Obesity     I have done the exam and reviewed the above chart and it is accurate to the best of my knowledge. Development worker, community has been used in this note in any air is in the dictation or transcription are unintentional.  [name], MD  [place]

## 2017-05-11 ENCOUNTER — Ambulatory Visit (INDEPENDENT_AMBULATORY_CARE_PROVIDER_SITE_OTHER): Payer: Medicare Other | Admitting: Certified Nurse Midwife

## 2017-05-11 ENCOUNTER — Encounter: Payer: Self-pay | Admitting: Certified Nurse Midwife

## 2017-05-11 VITALS — BP 142/78 | HR 97 | Ht 63.0 in | Wt 207.0 lb

## 2017-05-11 DIAGNOSIS — B373 Candidiasis of vulva and vagina: Secondary | ICD-10-CM | POA: Diagnosis not present

## 2017-05-11 DIAGNOSIS — B3731 Acute candidiasis of vulva and vagina: Secondary | ICD-10-CM

## 2017-05-11 NOTE — Progress Notes (Signed)
Obstetrics & Gynecology Office Visit   Chief Complaint:  Chief Complaint  Patient presents with  . Referral    vaginitis    History of Present Illness: 82 year old female was referred from Dr Rosanna Randy for follow up to a recent monilial vulvovaginitis. Patient has a history of a TAH and BSO that was done in 1986 shortly after her mastectomy for left breast cancer. She reports that in late December she noticed blood in the toilet after urinating. She was treated for a UTI with a 3 day course of antibiotics by her PCP office. After the antibiotics she had 2 more episodes of blood in the toilet after urinating. She was given a 10 day course of antibiotics and referred to urology. In the meantime she was seen by her dermatologist and treated for a fungal skin infection on her lower abdomen with 30 days of Diflucan and also given antifungal cream topically. A CT scan and a cystoscope were negative or normal. Her urologist detected a fulminant monilial vulvitis infection and gave her another Diflucan and she continued her nystatin topically. She has never had any vulvar irritation or itching.    Review of Systems:  Review of Systems  Constitutional: Negative for chills and fever.  Cardiovascular: Positive for leg swelling (left>right).  Genitourinary: Positive for hematuria. Negative for dysuria.  Musculoskeletal: Positive for joint pain (and swelling).  Skin: Negative for itching.     Past Medical History:  Past Medical History:  Diagnosis Date  . Arthritis   . Breast cancer ([place]) 1986   left mastectomy  . Cancer ([place]) 1986   left breast  . Elevated triglycerides with high cholesterol   . GERD (gastroesophageal reflux disease)    history of reflux  . Hypercholesterolemia   . Hypertension     Past Surgical History:  Past Surgical History:  Procedure Laterality Date  . ABDOMINAL HYSTERECTOMY  [id number]   TAH/BSO  . BREAST BIOPSY Right 01/23/2013   [place] bx/clip-neg  . BREAST SURGERY  Left 07/1984   Mastectomy  . CATARACT EXTRACTION W/ INTRAOCULAR LENS IMPLANT Left 2015  . EYE SURGERY Left 2013  . FOOT SURGERY Left 04/2013  . KNEE ARTHROPLASTY Right 11/03/2015   Procedure: COMPUTER ASSISTED TOTAL KNEE ARTHROPLASTY;  Surgeon: Dereck Leep, MD;  Location: [facility] ORS;  Service: Orthopedics;  Laterality: Right;  . MASTECTOMY Left 1986   positive    Gynecologic History: s/p TAH/BSO Obstetric History: [id number]  Family History:  Family History  Problem Relation Age of Onset  . Cancer Mother        baldder and breast  . Glaucoma Mother   . Kidney disease Mother   . Arthritis Mother   . Stroke Mother   . Breast cancer Mother 35  . Heart disease Father   . Cancer Brother        lung cancer, smoker  . Breast cancer Brother   . Breast cancer Cousin        3 mat cousins    Social History:  Social History   Socioeconomic History  . Marital status: Married    Spouse name: Not on file  . Number of children: 3  . Years of education: Not on file  . Highest education level: Bachelor's degree (e.g., BA, AB, BS)  Social Needs  . Financial resource strain: Not hard at all  . Food insecurity - worry: Never true  . Food insecurity - inability: Never true  . Transportation needs - medical: No  .  Transportation needs - non-medical: No  Occupational History  . Occupation: retired  Tobacco Use  . Smoking status: Never Smoker  . Smokeless tobacco: Never Used  Substance and Sexual Activity  . Alcohol use: No  . Drug use: No  . Sexual activity: Yes  Other Topics Concern  . Not on file  Social History Narrative  . Not on file    Allergies:  Allergies  Allergen Reactions  . Penicillin V   . Penicillin V Potassium Hives    Has patient had a PCN reaction causing immediate rash, facial/tongue/throat swelling, SOB or lightheadedness with hypotension: no Has patient had a PCN reaction causing severe rash involving mucus membranes or skin necrosis: {no Has patient had a  PCN reaction that required hospitalization no Has patient had a PCN reaction occurring within the last 10 years: no If all of the above answers are "NO", then may proceed with Cephalosporin use.  . Sulfa Antibiotics Hives    Medications: Prior to Admission medications   Medication Sig Start Date End Date Taking? Authorizing Provider  aspirin 81 MG tablet Take 81 mg by mouth daily.  12/12/12  Yes [provider]  CALCIUM-VITAMIN D PO Take 2 tablets by mouth daily.  12/12/12  Yes [provider]  Cholecalciferol 1000 UNITS capsule Take 1,000 Units by mouth at bedtime.  12/12/12  Yes [provider]  hydrochlorothiazide (HYDRODIURIL) 25 MG tablet Take 1 tablet (25 mg total) by mouth daily. 05/08/17  Yes [name]., MD  MULTIPLE VITAMIN PO Take 1 tablet by mouth daily.  12/12/12  Yes [provider]  nystatin cream (MYCOSTATIN) Apply 1 application topically 2 (two) times daily. 05/01/17  Yes [name]., MD  OMEGA-3 FATTY ACIDS PO Take 1 capsule by mouth daily.  12/12/12  Yes [provider]  rosuvastatin (CRESTOR) 20 MG tablet Take 0.5 tablets (10 mg total) by mouth daily. 10/03/16  Yes [name]., MD  clobetasol cream (TEMOVATE) 9.76 % Apply 1 application topically 2 (two) times daily. Patient not taking: Reported on 05/11/2017 05/08/17   [name]., MD    Physical Exam Vitals: BP (!) 142/78   Pulse 97   Ht 5\' 3"  (1.6 m)   Wt 207 lb (93.9 kg)   BMI 36.67 kg/m   Physical Exam  Constitutional: She is oriented to person, place, and time.  Well developed, well nourished WF  Genitourinary:  Genitourinary Comments: Vulva: no inflammation or lesions Vagina: thin vaginal walls, scant clear discharge Cervix: surgically asent  Neurological: She is alert and oriented to person, place, and time.  Skin: Skin is warm and dry. No erythema.  Psychiatric: She has a normal mood and affect.   Results for orders placed  or performed in visit on 05/11/17 (from the past 24 hour(s))  POCT Wet Prep [facility] [employer])     Status: Normal   Collection Time: 05/13/17  7:30 PM  Result Value Ref Range   Source Wet Prep POC vagina    WBC, Wet Prep HPF POC     Bacteria Wet Prep HPF POC  Few   BACTERIA WET PREP MORPHOLOGY POC     Clue Cells Wet Prep HPF POC None None   Clue Cells Wet Prep Whiff POC     Yeast Wet Prep HPF POC None    KOH Wet Prep POC     Trichomonas Wet Prep HPF POC Absent Absent     Assessment: 82 y.o. [id number]  resolved monilial vulvovaginitis  Plan: RTO prn   Dalia Heading, CNM

## 2017-05-13 LAB — POCT WET PREP (WET MOUNT): TRICHOMONAS WET PREP HPF POC: ABSENT

## 2017-05-18 DIAGNOSIS — E7800 Pure hypercholesterolemia, unspecified: Secondary | ICD-10-CM | POA: Diagnosis not present

## 2017-05-18 DIAGNOSIS — R5383 Other fatigue: Secondary | ICD-10-CM | POA: Diagnosis not present

## 2017-05-19 LAB — COMPREHENSIVE METABOLIC PANEL
ALBUMIN: 3.9 g/dL (ref 3.5–4.8)
ALT: 16 IU/L (ref 0–32)
AST: 18 IU/L (ref 0–40)
Albumin/Globulin Ratio: 1.4 (ref 1.2–2.2)
Alkaline Phosphatase: 83 IU/L (ref 39–117)
BUN / CREAT RATIO: 24 (ref 12–28)
BUN: 13 mg/dL (ref 8–27)
Bilirubin Total: 0.6 mg/dL (ref 0.0–1.2)
CO2: 25 mmol/L (ref 20–29)
CREATININE: 0.55 mg/dL — AB (ref 0.57–1.00)
Calcium: 9.6 mg/dL (ref 8.7–10.3)
Chloride: 103 mmol/L (ref 96–106)
GFR calc non Af Amer: 89 mL/min/{1.73_m2} (ref >59)
GFR, EST AFRICAN AMERICAN: 103 mL/min/{1.73_m2} (ref >59)
GLOBULIN, TOTAL: 2.7 g/dL (ref 1.5–4.5)
GLUCOSE: 113 mg/dL — AB (ref 65–99)
Potassium: 3.9 mmol/L (ref 3.5–5.2)
SODIUM: 145 mmol/L — AB (ref 134–144)
TOTAL PROTEIN: 6.6 g/dL (ref 6.0–8.5)

## 2017-05-19 LAB — LIPID PANEL
CHOLESTEROL TOTAL: 188 mg/dL (ref 100–199)
Chol/HDL Ratio: 3.8 ratio (ref 0.0–4.4)
HDL: 49 mg/dL (ref >39)
LDL Calculated: 91 mg/dL (ref 0–99)
TRIGLYCERIDES: 239 mg/dL — AB (ref 0–149)
VLDL CHOLESTEROL CAL: 48 mg/dL — AB (ref 5–40)

## 2017-05-19 LAB — CBC WITH DIFFERENTIAL/PLATELET
BASOS ABS: 0 10*3/uL (ref 0.0–0.2)
Basos: 0 %
EOS (ABSOLUTE): 0.1 10*3/uL (ref 0.0–0.4)
Eos: 2 %
HEMOGLOBIN: 14.2 g/dL (ref 11.1–15.9)
Hematocrit: 43.2 % (ref 34.0–46.6)
Immature Grans (Abs): 0 10*3/uL (ref 0.0–0.1)
Immature Granulocytes: 0 %
LYMPHS: 21 %
Lymphocytes Absolute: 1.9 10*3/uL (ref 0.7–3.1)
MCH: 30.1 pg (ref 26.6–33.0)
MCHC: 32.9 g/dL (ref 31.5–35.7)
MCV: 92 fL (ref 79–97)
MONOCYTES: 10 %
Monocytes Absolute: 1 10*3/uL — ABNORMAL HIGH (ref 0.1–0.9)
NEUTROS ABS: 6.2 10*3/uL (ref 1.4–7.0)
Neutrophils: 67 %
PLATELETS: 325 10*3/uL (ref 150–379)
RBC: 4.71 x10E6/uL (ref 3.77–5.28)
RDW: 14.3 % (ref 12.3–15.4)
WBC: 9.3 10*3/uL (ref 3.4–10.8)

## 2017-05-19 LAB — TSH: TSH: 1.95 u[IU]/mL (ref 0.450–4.500)

## 2017-05-23 ENCOUNTER — Encounter: Payer: Self-pay | Admitting: Family Medicine

## 2017-05-23 ENCOUNTER — Ambulatory Visit (INDEPENDENT_AMBULATORY_CARE_PROVIDER_SITE_OTHER): Payer: Medicare Other | Admitting: Family Medicine

## 2017-05-23 VITALS — BP 108/62 | HR 80 | Temp 97.5°F | Resp 16

## 2017-05-23 DIAGNOSIS — E7800 Pure hypercholesterolemia, unspecified: Secondary | ICD-10-CM

## 2017-05-23 DIAGNOSIS — M1990 Unspecified osteoarthritis, unspecified site: Secondary | ICD-10-CM

## 2017-05-23 DIAGNOSIS — I999 Unspecified disorder of circulatory system: Secondary | ICD-10-CM | POA: Diagnosis not present

## 2017-05-23 DIAGNOSIS — E559 Vitamin D deficiency, unspecified: Secondary | ICD-10-CM

## 2017-05-23 NOTE — Progress Notes (Signed)
[name] Female    DOB: 1936-01-20   82 y.o.   MRN: [id number] Visit Date: 05/23/2017  Today's Provider: [name], MD   Chief Complaint  Patient presents with  . Edema   Subjective:    Hyperlipidemia  This is a chronic problem. The problem is uncontrolled. Recent lipid tests were reviewed and are normal. Pertinent negatives include no chest pain, focal sensory loss, focal weakness, leg pain, myalgias or shortness of breath. Current antihyperlipidemic treatment includes statins (Pt reports restarting Crestor 20mg  1/2 tab a day about three weeks ago. ). There are no compliance problems.      Edema: Patient complains of edema. The location of the edema is lower leg(s) bilateral.  The edema has been moderate.  Onset of symptoms was several weeks ago, gradually improving since that time. The edema is present Worse in the evenings. The patient states never.  The swelling has been aggravated by nothing, relieved by diuretics, and been associated with nothing. Cardiac risk factors include advanced age (older than 93 for men, 43 for women) and obesity (BMI >= 30 kg/m2).       Allergies  Allergen Reactions  . Penicillin V   . Penicillin V Potassium Hives    Has patient had a PCN reaction causing immediate rash, facial/tongue/throat swelling, SOB or lightheadedness with hypotension: no Has patient had a PCN reaction causing severe rash involving mucus membranes or skin necrosis: {no Has patient had a PCN reaction that required hospitalization no Has patient had a PCN reaction occurring within the last 10 years: no If all of the above answers are "NO", then may proceed with Cephalosporin use.  . Sulfa Antibiotics Hives     Current Outpatient Medications:  .  aspirin 81 MG tablet, Take 81 mg by mouth daily. , Disp: , Rfl:  .  CALCIUM-VITAMIN D PO, Take 2 tablets by mouth daily. , Disp: , Rfl:  .  clobetasol cream (TEMOVATE) 9.37 %, Apply 1 application  topically 2 (two) times daily., Disp: 30 g, Rfl: 3 .  hydrochlorothiazide (HYDRODIURIL) 25 MG tablet, Take 1 tablet (25 mg total) by mouth daily., Disp: 30 tablet, Rfl: 3 .  MULTIPLE VITAMIN PO, Take 1 tablet by mouth daily. , Disp: , Rfl:  .  nystatin cream (MYCOSTATIN), Apply 1 application topically 2 (two) times daily., Disp: 30 g, Rfl: 1 .  OMEGA-3 FATTY ACIDS PO, Take 1 capsule by mouth daily. , Disp: , Rfl:  .  Cholecalciferol 1000 UNITS capsule, Take 1,000 Units by mouth at bedtime. , Disp: , Rfl:  .  rosuvastatin (CRESTOR) 20 MG tablet, Take 0.5 tablets (10 mg total) by mouth daily. (Patient not taking: Reported on 05/23/2017), Disp: 30 tablet, Rfl: 12  Review of Systems  Constitutional: Negative.   HENT: Negative.   Eyes: Negative.   Respiratory: Negative.  Negative for shortness of breath.   Cardiovascular: Positive for leg swelling. Negative for chest pain and palpitations.  Gastrointestinal: Negative.   Endocrine: Negative.   Musculoskeletal: Positive for arthralgias. Negative for myalgias.  Allergic/Immunologic: Negative.   Neurological: Negative for dizziness, focal weakness, light-headedness and headaches.  Psychiatric/Behavioral: Negative.     Social History   Tobacco Use  . Smoking status: Never Smoker  . Smokeless tobacco: Never Used  Substance Use Topics  . Alcohol use: No   Objective:   There were no vitals taken for this visit. There were no vitals filed for this visit.  Physical Exam  Constitutional: She is oriented to person, place, and time. She appears well-developed and well-nourished.  HENT:  Head: Normocephalic and atraumatic.  Eyes: Conjunctivae are normal.  Neck: No thyromegaly present.  Cardiovascular: Normal rate and regular rhythm.  Pulmonary/Chest: Effort normal and breath sounds normal.  Abdominal: Soft.  Musculoskeletal:  Trace edema.  Neurological: She is alert and oriented to person, place, and time.  Skin: Skin is warm and dry.    Psychiatric: She has a normal mood and affect. Her behavior is normal. Judgment and thought content normal.        Assessment & Plan:      HLD HTN Obesity H/o Breast Cancer     I have done the exam and reviewed the above chart and it is accurate to the best of my knowledge. Development worker, community has been used in this note in any air is in the dictation or transcription are unintentional.  [name], MD  [place]

## 2017-06-04 ENCOUNTER — Other Ambulatory Visit: Payer: Self-pay | Admitting: Family Medicine

## 2017-06-04 DIAGNOSIS — E7800 Pure hypercholesterolemia, unspecified: Secondary | ICD-10-CM

## 2017-06-04 NOTE — Telephone Encounter (Signed)
Pt requesting refill of  Crestor 20MG  sent to [facility]

## 2017-06-06 MED ORDER — ROSUVASTATIN CALCIUM 20 MG PO TABS: 10.0000 mg | ORAL_TABLET | Freq: Every day | ORAL | 12 refills | Status: DC

## 2017-06-06 NOTE — Telephone Encounter (Signed)
Pharmacy sent request. Faxed back.

## 2017-06-08 ENCOUNTER — Other Ambulatory Visit: Payer: Self-pay

## 2017-06-08 DIAGNOSIS — E7800 Pure hypercholesterolemia, unspecified: Secondary | ICD-10-CM

## 2017-07-04 DIAGNOSIS — M1712 Unilateral primary osteoarthritis, left knee: Secondary | ICD-10-CM | POA: Diagnosis not present

## 2017-07-04 DIAGNOSIS — Z6836 Body mass index (BMI) 36.0-36.9, adult: Secondary | ICD-10-CM | POA: Diagnosis not present

## 2017-07-04 DIAGNOSIS — E669 Obesity, unspecified: Secondary | ICD-10-CM | POA: Diagnosis not present

## 2017-07-04 DIAGNOSIS — Z96651 Presence of right artificial knee joint: Secondary | ICD-10-CM | POA: Diagnosis not present

## 2017-07-30 ENCOUNTER — Ambulatory Visit (INDEPENDENT_AMBULATORY_CARE_PROVIDER_SITE_OTHER): Payer: Medicare Other | Admitting: Family Medicine

## 2017-07-30 ENCOUNTER — Encounter: Payer: Self-pay | Admitting: Family Medicine

## 2017-07-30 VITALS — BP 138/60 | HR 68 | Temp 98.5°F | Resp 14 | Wt 203.0 lb

## 2017-07-30 DIAGNOSIS — E7800 Pure hypercholesterolemia, unspecified: Secondary | ICD-10-CM | POA: Diagnosis not present

## 2017-07-30 DIAGNOSIS — R399 Unspecified symptoms and signs involving the genitourinary system: Secondary | ICD-10-CM | POA: Diagnosis not present

## 2017-07-30 DIAGNOSIS — N390 Urinary tract infection, site not specified: Secondary | ICD-10-CM

## 2017-07-30 DIAGNOSIS — R600 Localized edema: Secondary | ICD-10-CM

## 2017-07-30 LAB — POCT URINALYSIS DIPSTICK
Bilirubin, UA: NEGATIVE
Glucose, UA: NEGATIVE
Ketones, UA: NEGATIVE
NITRITE UA: NEGATIVE
PROTEIN UA: NEGATIVE
SPEC GRAV UA: 1.015 (ref 1.010–1.025)
Urobilinogen, UA: 0.2 E.U./dL
pH, UA: 6 (ref 5.0–8.0)

## 2017-07-30 NOTE — Progress Notes (Signed)
[name] Female    DOB: 1935/08/10   82 y.o.   MRN: [id number] Visit Date: 07/30/2017  Today's Provider: [name], MD   Chief Complaint  Patient presents with  . Hyperlipidemia  . Edema   Subjective:    HPI  Pt is here for a follow up of edema. She was started on HCTZ on 05/08/17. She reports that the swelling is better. She denies any side effects to the medications. She reports that she has a little pressure when urinating and would like her urine checked. She also would like to ask about taking ASA. Also would like to know if she should have labs drawn since she is taking the diuretic.     Allergies  Allergen Reactions  . Penicillin V   . Penicillin V Potassium Hives    Has patient had a PCN reaction causing immediate rash, facial/tongue/throat swelling, SOB or lightheadedness with hypotension: no Has patient had a PCN reaction causing severe rash involving mucus membranes or skin necrosis: {no Has patient had a PCN reaction that required hospitalization no Has patient had a PCN reaction occurring within the last 10 years: no If all of the above answers are "NO", then may proceed with Cephalosporin use.  . Sulfa Antibiotics Hives     Current Outpatient Medications:  .  aspirin 81 MG tablet, Take 81 mg by mouth daily. , Disp: , Rfl:  .  CALCIUM-VITAMIN D PO, Take 2 tablets by mouth daily. , Disp: , Rfl:  .  Cholecalciferol 1000 UNITS capsule, Take 1,000 Units by mouth at bedtime. , Disp: , Rfl:  .  clobetasol cream (TEMOVATE) 6.94 %, Apply 1 application topically 2 (two) times daily., Disp: 30 g, Rfl: 3 .  hydrochlorothiazide (HYDRODIURIL) 25 MG tablet, Take 1 tablet (25 mg total) by mouth daily., Disp: 30 tablet, Rfl: 3 .  MULTIPLE VITAMIN PO, Take 1 tablet by mouth daily. , Disp: , Rfl:  .  rosuvastatin (CRESTOR) 20 MG tablet, Take 0.5 tablets (10 mg total) by mouth daily., Disp: 30 tablet, Rfl: 12 .  nystatin cream (MYCOSTATIN), Apply 1  application topically 2 (two) times daily. (Patient not taking: Reported on 07/30/2017), Disp: 30 g, Rfl: 1 .  OMEGA-3 FATTY ACIDS PO, Take 1 capsule by mouth daily. , Disp: , Rfl:   Review of Systems  Constitutional: Negative.   Eyes: Negative.   Respiratory: Negative.   Cardiovascular: Negative.   Gastrointestinal: Negative.   Endocrine: Negative.   Genitourinary: Positive for dysuria.  Musculoskeletal: Negative.   Allergic/Immunologic: Negative.   Neurological: Negative.   Hematological: Negative.   Psychiatric/Behavioral: Negative.     Social History   Tobacco Use  . Smoking status: Never Smoker  . Smokeless tobacco: Never Used  Substance Use Topics  . Alcohol use: No   Objective:   BP 138/60 (BP Location: Left Arm, Patient Position: Sitting, Cuff Size: Large)   Pulse 68   Temp 98.5 F (36.9 C) (Oral)   Resp 14   Wt 203 lb (92.1 kg)   SpO2 98%   BMI 35.96 kg/m  Vitals:   07/30/17 1350  BP: 138/60  Pulse: 68  Resp: 14  Temp: 98.5 F (36.9 C)  TempSrc: Oral  SpO2: 98%  Weight: 203 lb (92.1 kg)     Physical Exam  Constitutional: She is oriented to person, place, and time. She appears well-developed and well-nourished.  HENT:  Head: Normocephalic and atraumatic.  Eyes: Conjunctivae  are normal. No scleral icterus.  Neck: No thyromegaly present.  Cardiovascular: Normal rate, regular rhythm and normal heart sounds.  Pulmonary/Chest: Effort normal and breath sounds normal.  Abdominal: Soft.  Musculoskeletal:  Trace edema.  Lymphadenopathy:    She has no cervical adenopathy.  Neurological: She is alert and oriented to person, place, and time.  Skin: Skin is warm and dry.  Psychiatric: She has a normal mood and affect. Her behavior is normal. Judgment and thought content normal.        Assessment & Plan:     1. UTI symptoms  - POCT urinalysis dipstick  2. Urinary tract infection without hematuria, site unspecified  - Urine Culture  3.  Hypercholesterolemia   4. Pedal edema - Renal function panel      I have done the exam and reviewed the above chart and it is accurate to the best of my knowledge. Development worker, community has been used in this note in any air is in the dictation or transcription are unintentional.  [name], MD  [place]

## 2017-07-30 NOTE — Patient Instructions (Signed)
Stop daily aspirin

## 2017-07-31 LAB — RENAL FUNCTION PANEL
ALBUMIN: 4.1 g/dL (ref 3.5–4.7)
BUN/Creatinine Ratio: 21 (ref 12–28)
BUN: 13 mg/dL (ref 8–27)
CO2: 24 mmol/L (ref 20–29)
CREATININE: 0.62 mg/dL (ref 0.57–1.00)
Calcium: 9.6 mg/dL (ref 8.7–10.3)
Chloride: 103 mmol/L (ref 96–106)
GFR, EST AFRICAN AMERICAN: 98 mL/min/{1.73_m2} (ref >59)
GFR, EST NON AFRICAN AMERICAN: 85 mL/min/{1.73_m2} (ref >59)
GLUCOSE: 101 mg/dL — AB (ref 65–99)
PHOSPHORUS: 3 mg/dL (ref 2.5–4.5)
POTASSIUM: 3.8 mmol/L (ref 3.5–5.2)
SODIUM: 143 mmol/L (ref 134–144)

## 2017-08-01 LAB — URINE CULTURE

## 2017-08-01 NOTE — Progress Notes (Signed)
Advised  ED 

## 2017-09-26 ENCOUNTER — Other Ambulatory Visit: Payer: Self-pay | Admitting: Family Medicine

## 2017-11-26 ENCOUNTER — Ambulatory Visit (INDEPENDENT_AMBULATORY_CARE_PROVIDER_SITE_OTHER): Payer: Medicare Other | Admitting: Family Medicine

## 2017-11-26 ENCOUNTER — Encounter: Payer: Self-pay | Admitting: Family Medicine

## 2017-11-26 VITALS — BP 130/70 | HR 84 | Temp 97.5°F | Resp 16 | Wt 209.6 lb

## 2017-11-26 DIAGNOSIS — L299 Pruritus, unspecified: Secondary | ICD-10-CM

## 2017-11-26 NOTE — Patient Instructions (Signed)
Discussed use of Nystatin to lower abdominal rash. Will add Zyrtec 10 mg. Twice daily and Benadryl 25 mg. At bedtime. We will call you with the lab results.

## 2017-11-26 NOTE — Progress Notes (Signed)
  Subjective:     Patient ID: [name], female   DOB: 05/30/1935, 82 y.o.   MRN: [id number] Chief Complaint  Patient presents with  . Pruritis    Patient comes in office today with concerns of itching and redness of the skin for the past 24hrs. Patient states that her skin is similar to how it was when she has a yeast infection.. Patient denies hives, being under more stress, no new food, dertegent or skin products.    HPI States she has a rash under her abdomen and groin but just a few spots elsewhere. Has been applying Nystatin cream. States the itching has been on her trunk but no shortness of breath. Accompanied by her husband today.  Review of Systems     Objective:   Physical Exam  Constitutional: She appears well-developed and well-nourished. No distress.  Pulmonary/Chest: Breath sounds normal.  Skin:  Splotchy salmon appearing minimally raised rash in the distribution above. No scaling, vesicles or crusting.       Assessment:    1. Itching - Comprehensive metabolic panel    Plan:   Discussed use of Zyrtec 10 mg. Bid and Benadryl 25 mg at night. May continue Nystatin for her lower abdominal rash.Further f/u pending lab work. She has f/u with her primary M.D. Scheduled in one week.

## 2017-11-27 ENCOUNTER — Telehealth: Payer: Self-pay

## 2017-11-27 LAB — COMPREHENSIVE METABOLIC PANEL
A/G RATIO: 1.7 (ref 1.2–2.2)
ALBUMIN: 3.9 g/dL (ref 3.5–4.7)
ALT: 19 IU/L (ref 0–32)
AST: 17 IU/L (ref 0–40)
Alkaline Phosphatase: 76 IU/L (ref 39–117)
BILIRUBIN TOTAL: 0.4 mg/dL (ref 0.0–1.2)
BUN / CREAT RATIO: 25 (ref 12–28)
BUN: 14 mg/dL (ref 8–27)
CALCIUM: 9.8 mg/dL (ref 8.7–10.3)
CHLORIDE: 99 mmol/L (ref 96–106)
CO2: 26 mmol/L (ref 20–29)
Creatinine, Ser: 0.57 mg/dL (ref 0.57–1.00)
GFR, EST AFRICAN AMERICAN: 101 mL/min/{1.73_m2} (ref >59)
GFR, EST NON AFRICAN AMERICAN: 88 mL/min/{1.73_m2} (ref >59)
GLOBULIN, TOTAL: 2.3 g/dL (ref 1.5–4.5)
Glucose: 176 mg/dL — ABNORMAL HIGH (ref 65–99)
POTASSIUM: 3.7 mmol/L (ref 3.5–5.2)
SODIUM: 141 mmol/L (ref 134–144)
TOTAL PROTEIN: 6.2 g/dL (ref 6.0–8.5)

## 2017-11-27 NOTE — Telephone Encounter (Signed)
Patient advised as directed below. Patient reports the itching is getting better.  Thanks,  -Joseline

## 2017-11-27 NOTE — Telephone Encounter (Signed)
-----   Message from Carmon Ginsberg, [place] sent at 11/27/2017  7:22 AM EDT ----- Labs ok. Sugar is elevated but this was a non-fasting lab. Is Zyrtec helping with the itching?

## 2017-12-03 ENCOUNTER — Ambulatory Visit (INDEPENDENT_AMBULATORY_CARE_PROVIDER_SITE_OTHER): Payer: Medicare Other | Admitting: Family Medicine

## 2017-12-03 VITALS — BP 142/82 | HR 68 | Temp 97.7°F | Resp 16 | Wt 212.0 lb

## 2017-12-03 DIAGNOSIS — R7309 Other abnormal glucose: Secondary | ICD-10-CM

## 2017-12-03 DIAGNOSIS — Z23 Encounter for immunization: Secondary | ICD-10-CM | POA: Diagnosis not present

## 2017-12-03 DIAGNOSIS — Z6837 Body mass index (BMI) 37.0-37.9, adult: Secondary | ICD-10-CM | POA: Diagnosis not present

## 2017-12-03 DIAGNOSIS — C50912 Malignant neoplasm of unspecified site of left female breast: Secondary | ICD-10-CM | POA: Diagnosis not present

## 2017-12-03 DIAGNOSIS — Z170 Estrogen receptor positive status [ER+]: Secondary | ICD-10-CM | POA: Diagnosis not present

## 2017-12-03 DIAGNOSIS — G459 Transient cerebral ischemic attack, unspecified: Secondary | ICD-10-CM

## 2017-12-03 LAB — POCT GLYCOSYLATED HEMOGLOBIN (HGB A1C): Hemoglobin A1C: 6 % — AB (ref 4.0–5.6)

## 2017-12-03 NOTE — Patient Instructions (Signed)
Start OTC Probiotic daily and Asprin 81mg  a day.

## 2017-12-03 NOTE — Progress Notes (Signed)
Elizabeth Trujillo  MRN: [id number] DOB: Jan 09, 1936  Subjective:  HPI   The patient is an 82 year old female who presents for follow up of chronic health.   She was seen in the office recently for an acute visit.    Hypertension-her blood pressure has been stable on her last few visits.  BP Readings from Last 3 Encounters:  12/03/17 (!) 142/82  11/26/17 130/70  07/30/17 138/60   The patient also complains of having 6 episodes of explosive diarrhea without any illness.  She states that it has happened over the last 2 months with the last episode being this weekend.  She does not relate it to any dietary changes or new medicine.    The patient also wants to have her blood work checked because she noticed her glucose was elevated on her last lab check.  She states she was not fasting at the time of that blood draw.  The patient also wants to discuss the visit she had 2 weeks ago with the PA.  She states she was having itching and also had some breakout of yeast under her breasts and some "spots" on her abdomen.  She was npot told what it was thought to be and would like more information if it was related to her yeast infection.    The patient also reports that she needs a refill on her Nystatin.    The patient has asked about continuing to take the HCTZ.  She was reading information on it and noted that it said you  Should not take if you are allergic to Sulfa.  Patient reports an allergy to Sulfa and doesn't know if she should continue the HCTZ.  Patient would like to have it noted that she has had 2 episode of TIA like symptoms.  She had the first one on 08/28/17 and another on 11/28/17.  She reports she had an episode where she was trying to write a card and when she was writing she was getting the letters all jumbled up.   The second time she was typing and she could not type the correct letters.  She said she went back to try again and the same thing happened.  She also said that she was  unable to process and speak words.  Each episode only lasted a few minutes.  The patient would like to know if she should go back  On her aspirin since having these two episodes.   Patient Active Problem List   Diagnosis Date Noted  . Fatigue 01/06/2016  . Urinary tract infection, site not specified 11/21/2015  . S/P total knee arthroplasty 11/03/2015  . Knee pain 09/28/2015  . Myalgia 09/28/2015  . Allergic rhinitis 01/06/2015  . Confusion state 01/06/2015  . Dermatitis, eczematoid 01/06/2015  . Episode of hypertension 01/06/2015  . Hypercholesterolemia 01/06/2015  . Angiopathy 01/06/2015  . Malignant neoplasm of breast ([place]) 01/06/2015  . Adiposity 01/06/2015  . Osteoarthritis 01/06/2015  . Osteopenia 01/06/2015  . Avitaminosis D 01/06/2015    Past Medical History:  Diagnosis Date  . Arthritis   . Breast cancer ([place]) 1986   left mastectomy  . Cancer ([place]) 1986   left breast  . Elevated triglycerides with high cholesterol   . GERD (gastroesophageal reflux disease)    history of reflux  . Hypercholesterolemia   . Hypertension     Social History   Socioeconomic History  . Marital status: Married    Spouse name: Not on file  .  Number of children: 3  . Years of education: Not on file  . Highest education level: Bachelor's degree (e.g., BA, AB, BS)  Occupational History  . Occupation: retired  Scientific laboratory technician  . Financial resource strain: Not hard at all  . Food insecurity:    Worry: Never true    Inability: Never true  . Transportation needs:    Medical: No    Non-medical: No  Tobacco Use  . Smoking status: Never Smoker  . Smokeless tobacco: Never Used  Substance and Sexual Activity  . Alcohol use: No  . Drug use: No  . Sexual activity: Yes  Lifestyle  . Physical activity:    Days per week: 0 days    Minutes per session: 0 min  . Stress: Not at all  Relationships  . Social connections:    Talks on phone: Not on file    Gets together: Not on file     Attends religious service: Not on file    Active member of club or organization: Not on file    Attends meetings of clubs or organizations: Not on file    Relationship status: Not on file  . Intimate partner violence:    Fear of current or ex partner: No    Emotionally abused: No    Physically abused: No    Forced sexual activity: No  Other Topics Concern  . Not on file  Social History Narrative  . Not on file    Outpatient Encounter Medications as of 12/03/2017  Medication Sig  . aspirin 81 MG tablet Take 81 mg by mouth daily.   Marland Kitchen CALCIUM-VITAMIN D PO Take 2 tablets by mouth daily.   . Cholecalciferol 1000 UNITS capsule Take 1,000 Units by mouth at bedtime.   . hydrochlorothiazide (HYDRODIURIL) 25 MG tablet TAKE 1 TABLET BY MOUTH ONCE DAILY  . MULTIPLE VITAMIN PO Take 1 tablet by mouth daily.   Marland Kitchen nystatin cream (MYCOSTATIN) Apply 1 application topically 2 (two) times daily.  . OMEGA-3 FATTY ACIDS PO Take 1 capsule by mouth daily.   . rosuvastatin (CRESTOR) 20 MG tablet Take 0.5 tablets (10 mg total) by mouth daily.  . [DISCONTINUED] clobetasol cream (TEMOVATE) 9.32 % Apply 1 application topically 2 (two) times daily.   No facility-administered encounter medications on file as of 12/03/2017.     Allergies  Allergen Reactions  . Penicillin V   . Penicillin V Potassium Hives    Has patient had a PCN reaction causing immediate rash, facial/tongue/throat swelling, SOB or lightheadedness with hypotension: no Has patient had a PCN reaction causing severe rash involving mucus membranes or skin necrosis: {no Has patient had a PCN reaction that required hospitalization no Has patient had a PCN reaction occurring within the last 10 years: no If all of the above answers are "NO", then may proceed with Cephalosporin use.  . Sulfa Antibiotics Hives    Review of Systems  Constitutional: Negative for fever and malaise/fatigue.  HENT: Negative.   Eyes: Negative.   Respiratory: Negative  for cough, shortness of breath and wheezing.   Cardiovascular: Negative for chest pain, palpitations, orthopnea, claudication and leg swelling.  Gastrointestinal: Negative.   Genitourinary: Negative for frequency.  Skin: Negative.   Neurological: Negative for dizziness and headaches.  Endo/Heme/Allergies: Negative for polydipsia.  Psychiatric/Behavioral: Negative.     Objective:  BP (!) 142/82 (BP Location: Right Arm, Patient Position: Sitting, Cuff Size: Normal)   Pulse 68   Temp 97.7 F (36.5 C) (Oral)  Resp 16   Wt 212 lb (96.2 kg)   BMI 37.55 kg/m   Physical Exam  Constitutional: She is oriented to person, place, and time and well-developed, well-nourished, and in no distress.  HENT:  Head: Normocephalic and atraumatic.  Right Ear: External ear normal.  Left Ear: External ear normal.  Nose: Nose normal.  Eyes: No scleral icterus.  Neck: No thyromegaly present.  Cardiovascular: Normal rate, regular rhythm, normal heart sounds and intact distal pulses.  Pulmonary/Chest: Effort normal and breath sounds normal.  Left breast surgically absent.  Abdominal: Soft.  Musculoskeletal: She exhibits no edema.  Neurological: She is alert and oriented to person, place, and time. Gait normal. GCS score is 15.  Grossly nonfocal.  Skin: Skin is warm and dry.  Psychiatric: Mood, memory, affect and judgment normal.    Assessment and Plan :   1. TIA (transient ischemic attack) Risk factors treated. - Ambulatory referral to Neurology  2. Need for influenza vaccination  - Flu vaccine HIGH DOSE PF (Fluzone High dose)  3. Elevated glucose Work on lifestyle.  Stop HCTZ - POCT glycosylated hemoglobin (Hb A1C)--6.0  4. Malignant neoplasm of left breast in female, estrogen receptor positive, unspecified site of breast ([place])   5. Class 2 severe obesity due to excess calories with serious comorbidity and body mass index (BMI) of 37.0 to 37.9 in adult [facility]) Pt with HTN./,Breast  cancer/OA/prediabetes  I have done the exam and reviewed the chart and it is accurate to the best of my knowledge. Development worker, community has been used and  any errors in dictation or transcription are unintentional. Miguel Aschoff M.D. [facility]

## 2017-12-06 ENCOUNTER — Other Ambulatory Visit: Payer: Self-pay | Admitting: Family Medicine

## 2017-12-06 DIAGNOSIS — B373 Candidiasis of vulva and vagina: Secondary | ICD-10-CM

## 2017-12-06 DIAGNOSIS — B3731 Acute candidiasis of vulva and vagina: Secondary | ICD-10-CM

## 2017-12-06 MED ORDER — NYSTATIN 100000 UNIT/GM EX CREA: 1.0000 "application " | TOPICAL_CREAM | Freq: Two times a day (BID) | CUTANEOUS | 1 refills | Status: DC

## 2017-12-06 NOTE — Telephone Encounter (Signed)
Patient is requesting a refill on the following medication  nystatin cream (MYCOSTATIN)   She uses [facility] in [place].  She would like this put on hold at the pharmacy for now.

## 2017-12-20 DIAGNOSIS — D225 Melanocytic nevi of trunk: Secondary | ICD-10-CM | POA: Diagnosis not present

## 2017-12-20 DIAGNOSIS — D2272 Melanocytic nevi of left lower limb, including hip: Secondary | ICD-10-CM | POA: Diagnosis not present

## 2017-12-20 DIAGNOSIS — D2271 Melanocytic nevi of right lower limb, including hip: Secondary | ICD-10-CM | POA: Diagnosis not present

## 2017-12-20 DIAGNOSIS — L821 Other seborrheic keratosis: Secondary | ICD-10-CM | POA: Diagnosis not present

## 2017-12-20 DIAGNOSIS — L218 Other seborrheic dermatitis: Secondary | ICD-10-CM | POA: Diagnosis not present

## 2017-12-20 DIAGNOSIS — D2262 Melanocytic nevi of left upper limb, including shoulder: Secondary | ICD-10-CM | POA: Diagnosis not present

## 2017-12-20 DIAGNOSIS — L918 Other hypertrophic disorders of the skin: Secondary | ICD-10-CM | POA: Diagnosis not present

## 2017-12-20 DIAGNOSIS — D2261 Melanocytic nevi of right upper limb, including shoulder: Secondary | ICD-10-CM | POA: Diagnosis not present

## 2017-12-21 ENCOUNTER — Ambulatory Visit (INDEPENDENT_AMBULATORY_CARE_PROVIDER_SITE_OTHER): Payer: Medicare Other | Admitting: Family Medicine

## 2017-12-21 ENCOUNTER — Encounter: Payer: Self-pay | Admitting: Family Medicine

## 2017-12-21 VITALS — BP 142/62 | HR 74 | Temp 98.4°F | Resp 16 | Wt 212.0 lb

## 2017-12-21 DIAGNOSIS — R197 Diarrhea, unspecified: Secondary | ICD-10-CM | POA: Diagnosis not present

## 2017-12-21 MED ORDER — METRONIDAZOLE 500 MG PO TABS: 500.0000 mg | ORAL_TABLET | Freq: Three times a day (TID) | ORAL | 0 refills | Status: DC

## 2017-12-21 NOTE — Patient Instructions (Signed)
You can start taking metronidazole prescription after collecting the stool sample

## 2017-12-21 NOTE — Progress Notes (Signed)
[name] Female    DOB: 01-01-1936   82 y.o.   MRN: [id number] Visit Date: 12/21/2017  Today's Provider: Lelon Huh, MD   Chief Complaint  Patient presents with  . Diarrhea   Subjective:    HPI Patient comes in today c/o recurrent diarrhea. She reports that she has had symptoms for at least 3 weeks. She reports that her symptoms are progressively getting worse. She does not feel that her symptoms are food related. She does have abdominal pain (she describes it as gas pains) 5-10 mins before she has to have a BM. She denies melena or hematochezia. She denies vomiting. She denies being on any antibiotics recently. She has not taken any medications to help control her diarrhea. She has started taking probiotics but does not know if she should continue.  States that episodes are sporadic, the had been occurring every few weeks, but has had two episodes this week, the last was last night. Will have sudden onset of urge to go and when she gets to bathroom stool is perfuse and watery.  States between episodes she has usually BMs. No blood in stool. No recent medication changes. Hasn't notice any food associations.   She would like a referral to GI.     Allergies  Allergen Reactions  . Penicillin V   . Penicillin V Potassium Hives    Has patient had a PCN reaction causing immediate rash, facial/tongue/throat swelling, SOB or lightheadedness with hypotension: no Has patient had a PCN reaction causing severe rash involving mucus membranes or skin necrosis: {no Has patient had a PCN reaction that required hospitalization no Has patient had a PCN reaction occurring within the last 10 years: no If all of the above answers are "NO", then may proceed with Cephalosporin use.  . Sulfa Antibiotics Hives     Current Outpatient Medications:  .  aspirin 81 MG tablet, Take 81 mg by mouth daily. , Disp: , Rfl:  .  CALCIUM-VITAMIN D PO, Take 2 tablets by mouth daily. , Disp: ,  Rfl:  .  Cholecalciferol 1000 UNITS capsule, Take 1,000 Units by mouth at bedtime. , Disp: , Rfl:  .  MULTIPLE VITAMIN PO, Take 1 tablet by mouth daily. , Disp: , Rfl:  .  nystatin cream (MYCOSTATIN), Apply 1 application topically 2 (two) times daily., Disp: 30 g, Rfl: 1 .  OMEGA-3 FATTY ACIDS PO, Take 1 capsule by mouth daily. , Disp: , Rfl:  .  rosuvastatin (CRESTOR) 20 MG tablet, Take 0.5 tablets (10 mg total) by mouth daily., Disp: 30 tablet, Rfl: 12 .  hydrochlorothiazide (HYDRODIURIL) 25 MG tablet, TAKE 1 TABLET BY MOUTH ONCE DAILY (Patient not taking: Reported on 12/21/2017), Disp: 90 tablet, Rfl: 1  Review of Systems  Constitutional: Negative for activity change, chills and fatigue.  Gastrointestinal: Positive for abdominal pain and diarrhea. Negative for abdominal distention, anal bleeding, blood in stool, constipation, nausea, rectal pain and vomiting.  Genitourinary: Negative.   Neurological: Negative for light-headedness and headaches.    Social History   Tobacco Use  . Smoking status: Never Smoker  . Smokeless tobacco: Never Used  Substance Use Topics  . Alcohol use: No   Objective:   BP (!) 142/62 (BP Location: Right Arm, Patient Position: Sitting, Cuff Size: Large)   Pulse 74   Temp 98.4 F (36.9 C)   Resp 16   Wt 212 lb (96.2 kg)   SpO2 99%   BMI 37.55  kg/m  Vitals:   12/21/17 1558  BP: (!) 142/62  Pulse: 74  Resp: 16  Temp: 98.4 F (36.9 C)  SpO2: 99%  Weight: 212 lb (96.2 kg)     Physical Exam  General Appearance:    Alert, cooperative, no distress  Eyes:    PERRL, conjunctiva/corneas clear, EOM's intact       Lungs:     Clear to auscultation bilaterally, respirations unlabored  Heart:    Regular rate and rhythm  Abdomen:   bowel sounds present and normal in all 4 quadrants. No CVA tenderness       Assessment & Plan:     1. Diarrhea, unspecified type Intermittent. She is mainly here because she wants to initiate GI referral ASAP. Will go  ahead and check stool studies. She can try empiric metronidazole after she collects stool sample. Can continue probiotic and try avoiding dairy and glutens.   - C difficile Toxins A+B W/Rflx - Clostridium Difficile by PCR - Fecal lactoferrin, quant - Stool culture - Ova and parasite examination - Ambulatory referral to Gastroenterology       Lelon Huh, MD  [facility]

## 2017-12-22 DIAGNOSIS — R197 Diarrhea, unspecified: Secondary | ICD-10-CM | POA: Diagnosis not present

## 2017-12-24 ENCOUNTER — Telehealth: Payer: Self-pay | Admitting: Family Medicine

## 2017-12-24 NOTE — Telephone Encounter (Signed)
Pt called saying she needs to get an order for 8 bras to [facility].  She said medicare would pay for them.  [facility] # is [id number]  She does not have their fax number  CB# [phone]  Thanks Con Memos

## 2017-12-24 NOTE — Telephone Encounter (Signed)
Ok to order? Please advise. Thanks!  

## 2017-12-24 NOTE — Telephone Encounter (Signed)
ok 

## 2017-12-24 NOTE — Telephone Encounter (Signed)
Order written

## 2017-12-28 ENCOUNTER — Telehealth: Payer: Self-pay

## 2017-12-28 LAB — STOOL CULTURE: E coli, Shiga toxin Assay: NEGATIVE

## 2017-12-28 LAB — FECAL LACTOFERRIN, QUANT: Lactoferrin, Fecal, Quant.: 2.57 ug/mL(g) (ref 0.00–7.24)

## 2017-12-28 NOTE — Telephone Encounter (Signed)
-----   Message from Birdie Sons, MD sent at 12/28/2017  8:00 AM EDT ----- Stool cultures are all negative. If not better then need to follow up with GI.

## 2017-12-28 NOTE — Telephone Encounter (Signed)
Advised  ED 

## 2017-12-28 NOTE — Telephone Encounter (Signed)
[facility]  ED  ----- Message from Birdie Sons, MD sent at 12/28/2017  8:00 AM EDT ----- Stool cultures are all negative. If not better then need to follow up with GI.

## 2017-12-28 NOTE — Telephone Encounter (Signed)
Left message to call back  

## 2017-12-29 LAB — C DIFFICILE, CYTOTOXIN B

## 2017-12-29 LAB — CLOSTRIDIUM DIFFICILE BY PCR: Toxigenic C. Difficile by PCR: NEGATIVE

## 2017-12-29 LAB — C DIFFICILE TOXINS A+B W/RFLX: C difficile Toxins A+B, EIA: NEGATIVE

## 2017-12-31 ENCOUNTER — Telehealth: Payer: Self-pay | Admitting: Family Medicine

## 2017-12-31 MED ORDER — METRONIDAZOLE 500 MG PO TABS: 500.0000 mg | ORAL_TABLET | Freq: Three times a day (TID) | ORAL | 0 refills | Status: AC

## 2017-12-31 NOTE — Telephone Encounter (Signed)
Pt started having painful bowel movements episodes on Sunday.  Pt had 3 movements in a row w/ gas pains and loser after each one. Pt didn't have the episodes while on the antibiotic - metronidazole 500 mg. Pt finished taking this on yesterday.   Please advise.  Thanks, [employer]

## 2017-12-31 NOTE — Telephone Encounter (Signed)
Does she need to come back in or GI referral?  Thanks,   -Mickel Baas

## 2017-12-31 NOTE — Telephone Encounter (Signed)
A GI referral has already been ordered. It looks like they have been trying to contact her. Can take another round of metronidazole if that helped. Have sent in 10 day refill to [facility].

## 2018-01-01 ENCOUNTER — Encounter: Payer: Self-pay | Admitting: Gastroenterology

## 2018-01-01 ENCOUNTER — Ambulatory Visit (INDEPENDENT_AMBULATORY_CARE_PROVIDER_SITE_OTHER): Payer: Medicare Other | Admitting: Gastroenterology

## 2018-01-01 VITALS — BP 131/84 | HR 80 | Ht 63.0 in | Wt 214.2 lb

## 2018-01-01 DIAGNOSIS — K529 Noninfective gastroenteritis and colitis, unspecified: Secondary | ICD-10-CM

## 2018-01-01 LAB — OVA AND PARASITE EXAMINATION

## 2018-01-01 NOTE — Telephone Encounter (Signed)
Patient advised. She states she has an appointment today with GI.

## 2018-01-01 NOTE — Progress Notes (Signed)
Jonathon Bellows MD, MRCP(U.K) [address]  [place]  [place], [place] [address]  Main: [phone]  Fax: [phone]   Gastroenterology Consultation  Referring Provider:     Birdie Sons, MD Primary Care Physician:  [name]., MD Primary Gastroenterologist:  Dr. Jonathon Bellows  Reason for Consultation:     Diarrhea         HPI:   Elizabeth Trujillo is a 82 y.o. y/o female referred for consultation & management  by Dr. Rosanna Randy, Retia Passe., MD.    She has been referred for diarrhea.  12/22/17: C diff , lactoferrin, stool culture negative. Last colonoscopy was 13 years back and was normal   Diarrhea :  Onset: All began started 2-3 months back , getting worse    Number of bowel movements a day : Each episode of diarrhea occurs once every few weeks, lasts for an occasions and returns to normal, has 1-3 episodes, associated with pain , in the rectum , feels better when she passes gas, no bloating or distension    Color : normal to yellow when loose- not much smell   Consistency:  Starts out soft and then may be liquid.  Present status: no diarrhea - last episode was Sunday night    Shape of stool:  No    Weight loss:  No rather gaining  Prior colonoscopy:  2006 , in 2017 she says had cologuard was negative,  Given a course of antibiotics by Dr Caryn Section , during that week had 3 normal stools Artificial sugars/sodas/chewing gum:  Sweet n low- once a day, 3 cans of diet soda a week , ice cream - 3-5 times a week , "low calorie"   Bloating:  No   Gas:  No  Antibiotic use: yes      Past Medical History:  Diagnosis Date  . Arthritis   . Breast cancer ([place]) 1986   left mastectomy  . Cancer ([place]) 1986   left breast  . Elevated triglycerides with high cholesterol   . GERD (gastroesophageal reflux disease)    history of reflux  . Hypercholesterolemia   . Hypertension     Past Surgical History:  Procedure Laterality Date  . ABDOMINAL HYSTERECTOMY  [id number]   TAH/BSO  . BREAST BIOPSY Right 01/23/2013   [place] bx/clip-neg  . BREAST SURGERY Left 07/1984   Mastectomy  . CATARACT EXTRACTION W/ INTRAOCULAR LENS IMPLANT Left 2015  . EYE SURGERY Left 2013  . FOOT SURGERY Left 04/2013  . KNEE ARTHROPLASTY Right 11/03/2015   Procedure: COMPUTER ASSISTED TOTAL KNEE ARTHROPLASTY;  Surgeon: Dereck Leep, MD;  Location: [facility] ORS;  Service: Orthopedics;  Laterality: Right;  . MASTECTOMY Left 1986   positive    Prior to Admission medications   Medication Sig Start Date End Date Taking? Authorizing Provider  aspirin 81 MG tablet Take 81 mg by mouth daily.  12/12/12   [provider]  CALCIUM-VITAMIN D PO Take 2 tablets by mouth daily.  12/12/12   [provider]  Cholecalciferol 1000 UNITS capsule Take 1,000 Units by mouth at bedtime.  12/12/12   [provider]  hydrochlorothiazide (HYDRODIURIL) 25 MG tablet TAKE 1 TABLET BY MOUTH ONCE DAILY Patient not taking: Reported on 12/21/2017 09/26/17   [name]., MD  metroNIDAZOLE (FLAGYL) 500 MG tablet Take 1 tablet (500 mg total) by mouth 3 (three) times daily for 10 days. 12/31/17 01/10/18  Birdie Sons, MD  MULTIPLE VITAMIN  PO Take 1 tablet by mouth daily.  12/12/12   [provider]  nystatin cream (MYCOSTATIN) Apply 1 application topically 2 (two) times daily. 12/06/17   [name]., MD  OMEGA-3 FATTY ACIDS PO Take 1 capsule by mouth daily.  12/12/12   [provider]  rosuvastatin (CRESTOR) 20 MG tablet Take 0.5 tablets (10 mg total) by mouth daily. 06/06/17   [name]., MD    Family History  Problem Relation Age of Onset  . Cancer Mother        baldder and breast  . Glaucoma Mother   . Kidney disease Mother   . Arthritis Mother   . Stroke Mother   . Breast cancer Mother 25  . Heart disease Father   . Cancer Brother        lung cancer, smoker  . Breast cancer Brother   . Breast cancer Cousin        3 mat cousins      Social History   Tobacco Use  . Smoking status: Never Smoker  . Smokeless tobacco: Never Used  Substance Use Topics  . Alcohol use: No  . Drug use: No    Allergies as of 01/01/2018 - Review Complete 12/21/2017  Allergen Reaction Noted  . Penicillin v  01/06/2015  . Penicillin v potassium Hives 01/14/2015  . Sulfa antibiotics Hives 01/06/2015    Review of Systems:    All systems reviewed and negative except where noted in HPI.   Physical Exam:  There were no vitals taken for this visit. No LMP recorded. Patient has had a hysterectomy. Psych:  Alert and cooperative. Normal mood and affect. General:   Alert,  Well-developed, well-nourished, pleasant and cooperative in NAD Head:  Normocephalic and atraumatic. Eyes:  Sclera clear, no icterus.   Conjunctiva pink. Ears:  Normal auditory acuity. Nose:  No deformity, discharge, or lesions. Mouth:  No deformity or lesions,oropharynx pink & moist. Neck:  Supple; no masses or thyromegaly. Lungs:  Respirations even and unlabored.  Clear throughout to auscultation.   No wheezes, crackles, or rhonchi. No acute distress. Heart:  Regular rate and rhythm; no murmurs, clicks, rubs, or gallops. Abdomen:  Normal bowel sounds.  No bruits.  Soft, non-tender and non-distended without masses, hepatosplenomegaly or hernias noted.  No guarding or rebound tenderness.    Neurologic:  Alert and oriented x3;  grossly normal neurologically. Skin:  Intact without significant lesions or rashes. No jaundice. Lymph Nodes:  No significant cervical adenopathy. Psych:  Alert and cooperative. Normal mood and affect.  Imaging Studies: No results found.  Assessment and Plan:   Elizabeth Trujillo is a 81 y.o. y/o female has been referred for diarrhea , no evidence of bowel inflammation as lactoferrin is negative. Likely osmotic diarrhea based on history .    Plan  1. Stop all artificial sugars in diet  2. If not better in 2-3 weeks will discuss next steps.  Stool tests are reassuring   Follow up in 2-3 weeks   Dr Jonathon Bellows MD,MRCP(U.K)

## 2018-01-08 ENCOUNTER — Telehealth: Payer: Self-pay | Admitting: Family Medicine

## 2018-01-08 NOTE — Telephone Encounter (Signed)
[facility] called requesting medical supply form sent over.  States they have faxed on 01/01/18 and 01/04/18 to sign off on post mastectomy supplies.  [phone]

## 2018-01-08 NOTE — Telephone Encounter (Signed)
Do you have this, I have not seen it

## 2018-01-09 NOTE — Telephone Encounter (Signed)
I do not remember it.

## 2018-01-22 ENCOUNTER — Other Ambulatory Visit: Payer: Self-pay | Admitting: Neurology

## 2018-01-22 DIAGNOSIS — E669 Obesity, unspecified: Secondary | ICD-10-CM | POA: Diagnosis not present

## 2018-01-22 DIAGNOSIS — R4789 Other speech disturbances: Secondary | ICD-10-CM | POA: Diagnosis not present

## 2018-01-22 DIAGNOSIS — R404 Transient alteration of awareness: Secondary | ICD-10-CM | POA: Diagnosis not present

## 2018-01-22 DIAGNOSIS — R9082 White matter disease, unspecified: Secondary | ICD-10-CM | POA: Diagnosis not present

## 2018-01-22 DIAGNOSIS — R410 Disorientation, unspecified: Secondary | ICD-10-CM | POA: Diagnosis not present

## 2018-01-23 ENCOUNTER — Ambulatory Visit: Payer: Medicare Other | Admitting: Gastroenterology

## 2018-02-01 DIAGNOSIS — R410 Disorientation, unspecified: Secondary | ICD-10-CM | POA: Diagnosis not present

## 2018-02-04 ENCOUNTER — Ambulatory Visit: Payer: Medicare Other | Admitting: Family Medicine

## 2018-02-05 ENCOUNTER — Ambulatory Visit (INDEPENDENT_AMBULATORY_CARE_PROVIDER_SITE_OTHER): Payer: Medicare Other | Admitting: Gastroenterology

## 2018-02-05 ENCOUNTER — Encounter: Payer: Self-pay | Admitting: Gastroenterology

## 2018-02-05 VITALS — BP 138/84 | HR 83 | Ht 63.0 in | Wt 210.0 lb

## 2018-02-05 DIAGNOSIS — K529 Noninfective gastroenteritis and colitis, unspecified: Secondary | ICD-10-CM

## 2018-02-05 NOTE — Progress Notes (Signed)
Elizabeth Bellows MD, MRCP(U.K) [address]  [place]  [place], [place] [address]  Main: [phone]  Fax: [phone]   Primary Care Physician: [name]., MD  Primary Gastroenterologist:  Dr. Jonathon Trujillo   Chief Complaint  Patient presents with  . Follow-up    Diarrhea    HPI: Elizabeth Trujillo is a 82 y.o. female  Summary of history :  She is here today to see me as a follow up. Initially referred and seen on 01/01/18 for diarrhea. 12/22/17: C diff , lactoferrin, stool culture negative. Last colonoscopy was 13 years back and was normal . Began 2-3 months earlier and was getting worse , 1-3 episodes a day , lots of gas. Artificial sugars/sodas/chewing gum:  Sweet n low- once a day, 3 cans of diet soda a week , ice cream - 3-5 times a week , "low calorie"  .  Interval history   01/01/2018-  02/05/2018  Doing much better after cutting off all artificial sugars, occasional gas pains.     Current Outpatient Medications  Medication Sig Dispense Refill  . aspirin 81 MG tablet Take 81 mg by mouth daily.     Marland Kitchen CALCIUM-VITAMIN D PO Take 2 tablets by mouth daily.     . Cholecalciferol 1000 UNITS capsule Take 1,000 Units by mouth at bedtime.     . MULTIPLE VITAMIN PO Take 1 tablet by mouth daily.     Marland Kitchen nystatin cream (MYCOSTATIN) Apply 1 application topically 2 (two) times daily. 30 g 1  . rosuvastatin (CRESTOR) 20 MG tablet Take 0.5 tablets (10 mg total) by mouth daily. 30 tablet 12  . hydrochlorothiazide (HYDRODIURIL) 25 MG tablet TAKE 1 TABLET BY MOUTH ONCE DAILY (Patient not taking: Reported on 12/21/2017) 90 tablet 1  . OMEGA-3 FATTY ACIDS PO Take 1 capsule by mouth daily.      No current facility-administered medications for this visit.     Allergies as of 02/05/2018 - Review Complete 02/05/2018  Allergen Reaction Noted  . Penicillin v  01/06/2015  . Penicillin v potassium Hives 01/14/2015  . Sulfa antibiotics Hives 01/06/2015    ROS:  General: Negative  for anorexia, weight loss, fever, chills, fatigue, weakness. ENT: Negative for hoarseness, difficulty swallowing , nasal congestion. CV: Negative for chest pain, angina, palpitations, dyspnea on exertion, peripheral edema.  Respiratory: Negative for dyspnea at rest, dyspnea on exertion, cough, sputum, wheezing.  GI: See history of present illness. GU:  Negative for dysuria, hematuria, urinary incontinence, urinary frequency, nocturnal urination.  Endo: Negative for unusual weight change.    Physical Examination:   BP 138/84   Pulse 83   Ht 5\' 3"  (1.6 m)   Wt 210 lb (95.3 kg)   BMI 37.20 kg/m   General: Well-nourished, well-developed in no acute distress.  Eyes: No icterus. Conjunctivae pink. Mouth: Oropharyngeal mucosa moist and pink , no lesions erythema or exudate. Lungs: Clear to auscultation bilaterally. Non-labored. Heart: Regular rate and rhythm, no murmurs rubs or gallops.  Abdomen: Bowel sounds are normal, nontender, nondistended, no hepatosplenomegaly or masses, no abdominal bruits or hernia , no rebound or guarding.   Extremities: No lower extremity edema. No clubbing or deformities. Neuro: Alert and oriented x 3.  Grossly intact. Skin: Warm and dry, no jaundice.   Psych: Alert and cooperative, normal mood and affect.   Imaging Studies: No results found.  Assessment and Plan:   Elizabeth Trujillo is a 82 y.o. y/o female here to follow up  for diarrhea likely osmotic secondary to large qty of aritficial sugars in diet previously : significantly improved after d/c sugars. She will continue to maintain diary. Briefly discussed about a colonoscopy which she is not keen on presently.     Dr Elizabeth Bellows  MD,MRCP [facility]) Follow up in 2-3 months

## 2018-02-06 ENCOUNTER — Ambulatory Visit
Admission: RE | Admit: 2018-02-06 | Discharge: 2018-02-06 | Disposition: A | Discharge status: Home / Self Care | Payer: Medicare Other | Source: Ambulatory Visit | Attending: Neurology | Admitting: Neurology

## 2018-02-06 ENCOUNTER — Encounter (INDEPENDENT_AMBULATORY_CARE_PROVIDER_SITE_OTHER): Payer: Self-pay

## 2018-02-06 DIAGNOSIS — I6782 Cerebral ischemia: Secondary | ICD-10-CM | POA: Insufficient documentation

## 2018-02-06 DIAGNOSIS — R404 Transient alteration of awareness: Secondary | ICD-10-CM

## 2018-02-11 DIAGNOSIS — R410 Disorientation, unspecified: Secondary | ICD-10-CM | POA: Diagnosis not present

## 2018-02-11 DIAGNOSIS — R404 Transient alteration of awareness: Secondary | ICD-10-CM | POA: Diagnosis not present

## 2018-02-12 DIAGNOSIS — R410 Disorientation, unspecified: Secondary | ICD-10-CM | POA: Diagnosis not present

## 2018-02-12 DIAGNOSIS — R404 Transient alteration of awareness: Secondary | ICD-10-CM | POA: Diagnosis not present

## 2018-02-13 DIAGNOSIS — R404 Transient alteration of awareness: Secondary | ICD-10-CM | POA: Diagnosis not present

## 2018-02-13 DIAGNOSIS — R410 Disorientation, unspecified: Secondary | ICD-10-CM | POA: Diagnosis not present

## 2018-02-18 ENCOUNTER — Ambulatory Visit (INDEPENDENT_AMBULATORY_CARE_PROVIDER_SITE_OTHER): Payer: Medicare Other | Admitting: Family Medicine

## 2018-02-18 VITALS — BP 158/70 | HR 69 | Temp 97.5°F | Resp 16 | Wt 213.0 lb

## 2018-02-18 DIAGNOSIS — Z6837 Body mass index (BMI) 37.0-37.9, adult: Secondary | ICD-10-CM | POA: Diagnosis not present

## 2018-02-18 DIAGNOSIS — Z170 Estrogen receptor positive status [ER+]: Secondary | ICD-10-CM

## 2018-02-18 DIAGNOSIS — E66812 Obesity, class 2: Secondary | ICD-10-CM

## 2018-02-18 DIAGNOSIS — K591 Functional diarrhea: Secondary | ICD-10-CM

## 2018-02-18 DIAGNOSIS — R319 Hematuria, unspecified: Secondary | ICD-10-CM

## 2018-02-18 DIAGNOSIS — C50912 Malignant neoplasm of unspecified site of left female breast: Secondary | ICD-10-CM

## 2018-02-18 DIAGNOSIS — F4489 Other dissociative and conversion disorders: Secondary | ICD-10-CM

## 2018-02-18 DIAGNOSIS — G40909 Epilepsy, unspecified, not intractable, without status epilepticus: Secondary | ICD-10-CM

## 2018-02-18 DIAGNOSIS — G459 Transient cerebral ischemic attack, unspecified: Secondary | ICD-10-CM

## 2018-02-18 NOTE — Progress Notes (Signed)
Elizabeth Trujillo  MRN: [id number] DOB: [birth year]  Subjective:  HPI   The patient is an 82 year old female who presents for follow up after possible TIA in September.  The patient was referred to Neurology.  Patient has had MRI and 2 EEG.  She has not received the results of all of the test. She denies any more episodes of confusion.    She is still in the midst of workup with GI and Urology for her diarrhea and hematuria respectively. Because of the frequency of the diarrhea she is having problems with irritated hemorrhoids.   Patient Active Problem List   Diagnosis Date Noted  . Fatigue 01/06/2016  . Urinary tract infection, site not specified 11/21/2015  . S/P total knee arthroplasty 11/03/2015  . Knee pain 09/28/2015  . Myalgia 09/28/2015  . Allergic rhinitis 01/06/2015  . Confusion state 01/06/2015  . Dermatitis, eczematoid 01/06/2015  . Episode of hypertension 01/06/2015  . Hypercholesterolemia 01/06/2015  . Angiopathy 01/06/2015  . Malignant neoplasm of breast ([place]) 01/06/2015  . Adiposity 01/06/2015  . Osteoarthritis 01/06/2015  . Osteopenia 01/06/2015  . Avitaminosis D 01/06/2015    Past Medical History:  Diagnosis Date  . Arthritis   . Breast cancer ([place]) 1986   left mastectomy  . Cancer ([place]) 1986   left breast  . Elevated triglycerides with high cholesterol   . GERD (gastroesophageal reflux disease)    history of reflux  . Hypercholesterolemia   . Hypertension     Social History   Socioeconomic History  . Marital status: Married    Spouse name: Not on file  . Number of children: 3  . Years of education: Not on file  . Highest education level: Bachelor's degree (e.g., BA, AB, BS)  Occupational History  . Occupation: retired  Scientific laboratory technician  . Financial resource strain: Not hard at all  . Food insecurity:    Worry: Never true    Inability: Never true  . Transportation needs:    Medical: No    Non-medical: No  Tobacco Use  . Smoking status:  Never Smoker  . Smokeless tobacco: Never Used  Substance and Sexual Activity  . Alcohol use: No  . Drug use: No  . Sexual activity: Yes  Lifestyle  . Physical activity:    Days per week: 0 days    Minutes per session: 0 min  . Stress: Not at all  Relationships  . Social connections:    Talks on phone: Not on file    Gets together: Not on file    Attends religious service: Not on file    Active member of club or organization: Not on file    Attends meetings of clubs or organizations: Not on file    Relationship status: Not on file  . Intimate partner violence:    Fear of current or ex partner: No    Emotionally abused: No    Physically abused: No    Forced sexual activity: No  Other Topics Concern  . Not on file  Social History Narrative  . Not on file    Outpatient Encounter Medications as of 02/18/2018  Medication Sig  . aspirin 81 MG tablet Take 81 mg by mouth daily.   Marland Kitchen CALCIUM-VITAMIN D PO Take 2 tablets by mouth daily.   . Cholecalciferol 1000 UNITS capsule Take 1,000 Units by mouth at bedtime.   . MULTIPLE VITAMIN PO Take 1 tablet by mouth daily.   Marland Kitchen nystatin cream (MYCOSTATIN)  Apply 1 application topically 2 (two) times daily.  . OMEGA-3 FATTY ACIDS PO Take 1 capsule by mouth daily.   . rosuvastatin (CRESTOR) 20 MG tablet Take 0.5 tablets (10 mg total) by mouth daily.  . [DISCONTINUED] hydrochlorothiazide (HYDRODIURIL) 25 MG tablet TAKE 1 TABLET BY MOUTH ONCE DAILY (Patient not taking: Reported on 12/21/2017)   No facility-administered encounter medications on file as of 02/18/2018.     Allergies  Allergen Reactions  . Penicillin V   . Penicillin V Potassium Hives    Has patient had a PCN reaction causing immediate rash, facial/tongue/throat swelling, SOB or lightheadedness with hypotension: no Has patient had a PCN reaction causing severe rash involving mucus membranes or skin necrosis: {no Has patient had a PCN reaction that required hospitalization no Has  patient had a PCN reaction occurring within the last 10 years: no If all of the above answers are "NO", then may proceed with Cephalosporin use.  . Sulfa Antibiotics Hives    Review of Systems  Constitutional: Negative for fever and malaise/fatigue.  HENT: Negative.   Eyes: Negative.   Respiratory: Negative for cough, shortness of breath and wheezing.   Cardiovascular: Negative for chest pain, palpitations, orthopnea, claudication and leg swelling.  Gastrointestinal: Positive for diarrhea.  Musculoskeletal: Negative.   Skin: Negative.   Neurological: Negative for dizziness and headaches.  Endo/Heme/Allergies: Negative.   Psychiatric/Behavioral: Negative.     Objective:  BP (!) 158/70 (BP Location: Right Arm, Patient Position: Sitting, Cuff Size: Normal)   Pulse 69   Temp (!) 97.5 F (36.4 C) (Oral)   Resp 16   Wt 213 lb (96.6 kg)   SpO2 93%   BMI 37.73 kg/m   Physical Exam  Constitutional: She is oriented to person, place, and time and well-developed, well-nourished, and in no distress.  HENT:  Head: Normocephalic and atraumatic.  Right Ear: External ear normal.  Left Ear: External ear normal.  Nose: Nose normal.  Eyes: Conjunctivae are normal. No scleral icterus.  Neck: No thyromegaly present.  Cardiovascular: Normal rate, regular rhythm and normal heart sounds.  Pulmonary/Chest: Effort normal and breath sounds normal.  Abdominal: Soft.  Musculoskeletal:        General: No edema.  Neurological: She is alert and oriented to person, place, and time. Gait normal. GCS score is 15.  Skin: Skin is warm and dry.  Psychiatric: Mood, memory, affect and judgment normal.    Assessment and Plan :   1. TIA (transient ischemic attack) More than 50% of this greater than 25-minute visit spent in counseling and/or coordination of care.  2. Seizure disorder ([place])   3. Confusion state This appeared to be a TIA a year ago.  I think neurology is now leaning more towards seizure  disorder.  RI has been done and EEG is pending  4. Malignant neoplasm of left breast in female, estrogen receptor positive, unspecified site of breast ([place]) No metastasis to this point.  5. Class 2 severe obesity due to excess calories with serious comorbidity and body mass index (BMI) of 37.0 to 37.9 in adult ([place])   6. Functional diarrhea GI work-up ongoing  7. Hematuria, unspecified type Urology seeing patient   HPI, Exam and A&P Transcribed under the direction and in the presence of [name]., MD. Electronically Signed: Althea Trujillo, [place]

## 2018-03-05 DIAGNOSIS — M7062 Trochanteric bursitis, left hip: Secondary | ICD-10-CM | POA: Diagnosis not present

## 2018-03-05 DIAGNOSIS — E669 Obesity, unspecified: Secondary | ICD-10-CM | POA: Diagnosis not present

## 2018-04-10 DIAGNOSIS — H2511 Age-related nuclear cataract, right eye: Secondary | ICD-10-CM | POA: Diagnosis not present

## 2018-04-11 ENCOUNTER — Other Ambulatory Visit: Payer: Self-pay

## 2018-04-11 ENCOUNTER — Telehealth: Payer: Self-pay | Admitting: Family Medicine

## 2018-04-11 DIAGNOSIS — R310 Gross hematuria: Secondary | ICD-10-CM

## 2018-04-11 NOTE — Telephone Encounter (Signed)
Pt needing to find out her urologist name she was last referred to.  She is having symptoms again and needing to get an a appt.  Please advise.  Thanks, [employer]

## 2018-04-11 NOTE — Telephone Encounter (Signed)
Advised patient that she saw Dr. Erlene Quan at [facility] in 04/2017.

## 2018-04-12 ENCOUNTER — Other Ambulatory Visit
Admission: RE | Admit: 2018-04-12 | Discharge: 2018-04-12 | Disposition: A | Discharge status: Home / Self Care | Payer: Medicare Other | Attending: Urology | Admitting: Urology

## 2018-04-12 ENCOUNTER — Encounter: Payer: Self-pay | Admitting: Urology

## 2018-04-12 ENCOUNTER — Ambulatory Visit (INDEPENDENT_AMBULATORY_CARE_PROVIDER_SITE_OTHER): Payer: Medicare Other | Admitting: Urology

## 2018-04-12 VITALS — BP 161/96 | HR 86 | Ht 63.0 in | Wt 215.0 lb

## 2018-04-12 DIAGNOSIS — N3001 Acute cystitis with hematuria: Secondary | ICD-10-CM

## 2018-04-12 DIAGNOSIS — N390 Urinary tract infection, site not specified: Secondary | ICD-10-CM | POA: Diagnosis not present

## 2018-04-12 DIAGNOSIS — R310 Gross hematuria: Secondary | ICD-10-CM | POA: Diagnosis not present

## 2018-04-12 DIAGNOSIS — N9089 Other specified noninflammatory disorders of vulva and perineum: Secondary | ICD-10-CM

## 2018-04-12 LAB — URINALYSIS, COMPLETE (UACMP) WITH MICROSCOPIC
BILIRUBIN URINE: NEGATIVE
GLUCOSE, UA: NEGATIVE mg/dL
KETONES UR: NEGATIVE mg/dL
NITRITE: NEGATIVE
PH: 6 (ref 5.0–8.0)
RBC / HPF: >50 RBC/hpf (ref 0–5)
SPECIFIC GRAVITY, URINE: 1.025 (ref 1.005–1.030)

## 2018-04-12 MED ORDER — CIPROFLOXACIN HCL 500 MG PO TABS: 500.0000 mg | ORAL_TABLET | Freq: Two times a day (BID) | ORAL | 0 refills | Status: DC

## 2018-04-12 NOTE — Progress Notes (Signed)
04/12/2018 12:04 PM   Elizabeth Trujillo 1936/03/07 [id number]  Referring provider: [name]., MD [address] [place], [place] [address]  Chief Complaint  Patient presents with  . Hematuria    HPI: 83 year old with a personal history of gross hematuria status post recent work-up earlier this year who returns the office today with several episodes of gross hematuria (CT Urogram/ cystoscopy).  She reports 3 days in November when she had some painless gross hematuria.  She had a recurrence over the past week or so with associated lower pelvic pressure and discomfort.  She also describes an episode of acute pain at her clitoris which felt "bruised" without history of trauma or manipulation.  This resolved within 24 hours.  She is never had a thing like this before.  She denies any overt dysuria today but does have increased frequency urgency and foul-smelling urine.  UA today is somewhat suspicious for infection.  PMH: Past Medical History:  Diagnosis Date  . Arthritis   . Breast cancer ([place]) 1986   left mastectomy  . Cancer ([place]) 1986   left breast  . Elevated triglycerides with high cholesterol   . GERD (gastroesophageal reflux disease)    history of reflux  . Hypercholesterolemia   . Hypertension     Surgical History: Past Surgical History:  Procedure Laterality Date  . ABDOMINAL HYSTERECTOMY  [id number]   TAH/BSO  . BREAST BIOPSY Right 01/23/2013   [place] bx/clip-neg  . BREAST SURGERY Left 07/1984   Mastectomy  . CATARACT EXTRACTION W/ INTRAOCULAR LENS IMPLANT Left 2015  . EYE SURGERY Left 2013  . FOOT SURGERY Left 04/2013  . KNEE ARTHROPLASTY Right 11/03/2015   Procedure: COMPUTER ASSISTED TOTAL KNEE ARTHROPLASTY;  Surgeon: Dereck Leep, MD;  Location: [facility] ORS;  Service: Orthopedics;  Laterality: Right;  . MASTECTOMY Left 1986   positive    Home Medications:  Allergies as of 04/12/2018      Reactions   Penicillin V    Penicillin V  Potassium Hives   Has patient had a PCN reaction causing immediate rash, facial/tongue/throat swelling, SOB or lightheadedness with hypotension: no Has patient had a PCN reaction causing severe rash involving mucus membranes or skin necrosis: {no Has patient had a PCN reaction that required hospitalization no Has patient had a PCN reaction occurring within the last 10 years: no If all of the above answers are "NO", then may proceed with Cephalosporin use.   Sulfa Antibiotics Hives      Medication List       Accurate as of April 12, 2018 12:04 PM. Always use your most recent med list.        aspirin 81 MG tablet Take 81 mg by mouth daily.   CALCIUM-VITAMIN D PO Take 2 tablets by mouth daily.   Cholecalciferol 25 MCG (1000 UT) capsule Take 1,000 Units by mouth at bedtime.   ciprofloxacin 500 MG tablet Commonly known as:  CIPRO Take 1 tablet (500 mg total) by mouth 2 (two) times daily.   MULTIPLE VITAMIN PO Take 1 tablet by mouth daily.   nystatin cream Commonly known as:  MYCOSTATIN Apply 1 application topically 2 (two) times daily.   OMEGA-3 FATTY ACIDS PO Take 1 capsule by mouth daily.   rosuvastatin 20 MG tablet Commonly known as:  CRESTOR Take 0.5 tablets (10 mg total) by mouth daily.       Allergies:  Allergies  Allergen Reactions  . Penicillin V   .  Penicillin V Potassium Hives    Has patient had a PCN reaction causing immediate rash, facial/tongue/throat swelling, SOB or lightheadedness with hypotension: no Has patient had a PCN reaction causing severe rash involving mucus membranes or skin necrosis: {no Has patient had a PCN reaction that required hospitalization no Has patient had a PCN reaction occurring within the last 10 years: no If all of the above answers are "NO", then may proceed with Cephalosporin use.  . Sulfa Antibiotics Hives    Family History: Family History  Problem Relation Age of Onset  . Cancer Mother        baldder and breast    . Glaucoma Mother   . Kidney disease Mother   . Arthritis Mother   . Stroke Mother   . Breast cancer Mother 28  . Heart disease Father   . Cancer Brother        lung cancer, smoker  . Breast cancer Brother   . Breast cancer Cousin        3 mat cousins    Social History:  reports that she has never smoked. She has never used smokeless tobacco. She reports that she does not drink alcohol or use drugs.  ROS: UROLOGY Frequent Urination?: No Hard to postpone urination?: No Burning/pain with urination?: No Get up at night to urinate?: No Leakage of urine?: No Urine stream starts and stops?: No Trouble starting stream?: No Do you have to strain to urinate?: No Blood in urine?: Yes Urinary tract infection?: No Sexually transmitted disease?: No Injury to kidneys or bladder?: No Painful intercourse?: No Weak stream?: No Currently pregnant?: No Vaginal bleeding?: No Last menstrual period?: n  Gastrointestinal Nausea?: No Vomiting?: No Indigestion/heartburn?: No Diarrhea?: No Constipation?: No  Constitutional Fever: No Night sweats?: No Weight loss?: No Fatigue?: No  Skin Skin rash/lesions?: No Itching?: No  Eyes Blurred vision?: No Double vision?: No  Ears/Nose/Throat Sore throat?: No Sinus problems?: No  Hematologic/Lymphatic Swollen glands?: No Easy bruising?: No  Cardiovascular Leg swelling?: No Chest pain?: No  Respiratory Cough?: No Shortness of breath?: No  Endocrine Excessive thirst?: No  Musculoskeletal Back pain?: No Joint pain?: Yes  Neurological Headaches?: No Dizziness?: No  Psychologic Depression?: No Anxiety?: No  Physical Exam: BP (!) 161/96   Pulse 86   Ht 5\' 3"  (1.6 m)   Wt 215 lb (97.5 kg)   BMI 38.09 kg/m   Constitutional:  Alert and oriented, No acute distress. HEENT: [place] AT, moist mucus membranes.  Trachea midline, no masses. Cardiovascular: No clubbing, cyanosis, or edema. Respiratory: Normal respiratory  effort, no increased work of breathing. GI: Abdomen is soft, nontender, nondistended, no abdominal masses, obese Exam: Chaperoned by CMA, Fonnie Jarvis.  Slightly created external genitalia without significant erythema.  Atrophic vaginal mucosa with some prolapse appreciated.  Small urethral caruncle.  Clitoris appears grossly normal.  No discharge. Skin: No rashes, bruises or suspicious lesions. Neurologic: Grossly intact, no focal deficits, moving all 4 extremities. Psychiatric: Normal mood and affect.  Laboratory Data: Lab Results  Component Value Date   WBC 9.3 05/18/2017   HGB 14.2 05/18/2017   HCT 43.2 05/18/2017   MCV 92 05/18/2017   PLT 325 05/18/2017    Lab Results  Component Value Date   CREATININE 0.57 11/26/2017    Lab Results  Component Value Date   HGBA1C 6.0 (A) 12/03/2017    Urinalysis Results for orders placed or performed during the hospital encounter of 04/12/18  Urinalysis, Complete w Microscopic  Result  Value Ref Range   Color, Urine YELLOW YELLOW   APPearance CLOUDY (A) CLEAR   Specific Gravity, Urine 1.025 1.005 - 1.030   pH 6.0 5.0 - 8.0   Glucose, UA NEGATIVE NEGATIVE mg/dL   Hgb urine dipstick MODERATE (A) NEGATIVE   Bilirubin Urine NEGATIVE NEGATIVE   Ketones, ur NEGATIVE NEGATIVE mg/dL   Protein, ur TRACE (A) NEGATIVE mg/dL   Nitrite NEGATIVE NEGATIVE   Leukocytes, UA MODERATE (A) NEGATIVE   Squamous Epithelial / LPF 11-20 0 - 5   WBC, UA 21-50 0 - 5 WBC/hpf   RBC / HPF >50 0 - 5 RBC/hpf   Bacteria, UA FEW (A) NONE SEEN    Assessment & Plan:    1. Acute cystitis with hematuria Symptoms most consistent with UTI, UA suspicious Unfortunately, she had already provided a urine today in the lab prior to being catheterized for specimen We will go ahead and treat her for presumed UTI with Cipro for a week We will send culture Patient was advised to return if symptoms fail to improve She is agreeable this plan - Urine Culture; Future -  ciprofloxacin (CIPRO) 500 MG tablet; Take 1 tablet (500 mg total) by mouth 2 (two) times daily.  Dispense: 14 tablet; Refill: 0  2. Clitoral irritation Pelvic exam today unremarkable, no evidence or etiology for her isolated episode of clitoral discomfort   No follow-ups on file.  Hollice Espy, MD  [facility] [address], [place], [place] [address] [phone]

## 2018-04-14 LAB — URINE CULTURE: CULTURE: NO GROWTH

## 2018-04-15 ENCOUNTER — Telehealth: Payer: Self-pay

## 2018-04-15 NOTE — Telephone Encounter (Signed)
Patient returned call.  I shared the information with her.  She will complete the course of antibiotics.  If her symptoms persist, she will call to make a follow up visit.

## 2018-04-15 NOTE — Telephone Encounter (Signed)
Left message on patients cell phone to return call and left message with patients husband to have patient call the office.

## 2018-04-15 NOTE — Telephone Encounter (Signed)
-----   Message from Hollice Espy, MD sent at 04/15/2018  8:45 AM EST ----- Urine culture was negative but add like her to go ahead and complete the antibiotic course as a precaution.  If her symptoms fail to resolve, she should follow-up.  Hollice Espy, MD

## 2018-04-16 ENCOUNTER — Ambulatory Visit: Payer: Medicare Other | Admitting: Urology

## 2018-04-22 DIAGNOSIS — M19071 Primary osteoarthritis, right ankle and foot: Secondary | ICD-10-CM | POA: Diagnosis not present

## 2018-04-22 DIAGNOSIS — M79672 Pain in left foot: Secondary | ICD-10-CM | POA: Diagnosis not present

## 2018-04-30 ENCOUNTER — Telehealth: Payer: Self-pay | Admitting: Family Medicine

## 2018-04-30 NOTE — Telephone Encounter (Signed)
Pt needing to reschedule her appt on 05-02-18.  She has to go out of town. Please call her on her home phone.  Thanks, [employer]

## 2018-05-02 ENCOUNTER — Ambulatory Visit: Payer: Medicare Other

## 2018-05-02 NOTE — Telephone Encounter (Signed)
R/s AWv for 05/16/18 @ 1:20 AM. -MM

## 2018-05-09 ENCOUNTER — Telehealth: Payer: Self-pay | Admitting: Family Medicine

## 2018-05-09 NOTE — Telephone Encounter (Signed)
Visit is for a f/u of chronic health.  She will be due to have labs so it would help if she could fast, or she could come back one morning after the exam.  Attempted to call but VM was full and could not take message

## 2018-05-09 NOTE — Telephone Encounter (Signed)
Pt wanting to know what her April appt is for with Rosanna Randy and if she will need to fast for labs?  Please call pt back.  Thanks, [employer]

## 2018-05-13 ENCOUNTER — Encounter: Payer: Self-pay | Admitting: Gastroenterology

## 2018-05-13 ENCOUNTER — Ambulatory Visit (INDEPENDENT_AMBULATORY_CARE_PROVIDER_SITE_OTHER): Payer: Medicare Other | Admitting: Gastroenterology

## 2018-05-13 VITALS — BP 144/81 | HR 87 | Ht 63.0 in | Wt 217.0 lb

## 2018-05-13 DIAGNOSIS — K529 Noninfective gastroenteritis and colitis, unspecified: Secondary | ICD-10-CM | POA: Diagnosis not present

## 2018-05-13 NOTE — Telephone Encounter (Signed)
Pt advised of what her visit is for in April and to fast for labs

## 2018-05-13 NOTE — Progress Notes (Signed)
Elizabeth Bellows MD, MRCP(U.K) [address]  [place]  [place], [place] [address]  Main: [phone]  Fax: [phone]   Primary Care Physician: Elizabeth Trujillo., MD  Primary Gastroenterologist:  Dr. Jonathon Trujillo   No chief complaint on file.   HPI: Elizabeth Trujillo is a 83 y.o. female  Summary of history :  She is here today to see me as a follow up for osmotic diarrhea. Initially referred and seen on 01/01/18 for diarrhea. 12/22/17: C diff , lactoferrin, stool culture negative.Last colonoscopy was 13 years back and was normal. Began 2-3 months earlier and was getting worse , 1-3 episodes a day , lots of gas. Artificial sugars/sodas/chewing ONG:EXBMW n low- once a day, 3 cans of diet soda a week , ice cream - 3-5 times a week , "low calorie".  Interval history  02/05/2018-05/13/2018  No diarrhea, watching what she eats. Has on and off blood in her urine, already sees an urologist for the same. .       Current Outpatient Medications  Medication Sig Dispense Refill  . aspirin 81 MG tablet Take 81 mg by mouth daily.     Marland Kitchen CALCIUM-VITAMIN D PO Take 2 tablets by mouth daily.     . Cholecalciferol 1000 UNITS capsule Take 1,000 Units by mouth at bedtime.     . ciprofloxacin (CIPRO) 500 MG tablet Take 1 tablet (500 mg total) by mouth 2 (two) times daily. 14 tablet 0  . MULTIPLE VITAMIN PO Take 1 tablet by mouth daily.     Marland Kitchen nystatin cream (MYCOSTATIN) Apply 1 application topically 2 (two) times daily. 30 g 1  . OMEGA-3 FATTY ACIDS PO Take 1 capsule by mouth daily.     . rosuvastatin (CRESTOR) 20 MG tablet Take 0.5 tablets (10 mg total) by mouth daily. 30 tablet 12   No current facility-administered medications for this visit.     Allergies as of 05/13/2018 - Review Complete 04/12/2018  Allergen Reaction Noted  . Penicillin v  01/06/2015  . Penicillin v potassium Hives 01/14/2015  . Sulfa antibiotics Hives 01/06/2015    ROS:  General: Negative for anorexia,  weight loss, fever, chills, fatigue, weakness. ENT: Negative for hoarseness, difficulty swallowing , nasal congestion. CV: Negative for chest pain, angina, palpitations, dyspnea on exertion, peripheral edema.  Respiratory: Negative for dyspnea at rest, dyspnea on exertion, cough, sputum, wheezing.  GI: See history of present illness. GU:  Negative for dysuria, hematuria, urinary incontinence, urinary frequency, nocturnal urination.  Endo: Negative for unusual weight change.    Physical Examination:   BP (!) 144/81   Pulse 87   Ht 5\' 3"  (1.6 m)   Wt 217 lb (98.4 kg)   BMI 38.44 kg/m   General: Well-nourished, well-developed in no acute distress.  Eyes: No icterus. Conjunctivae pink. Mouth: Oropharyngeal mucosa moist and pink , no lesions erythema or exudate. Lungs: Clear to auscultation bilaterally. Non-labored. Heart: Regular rate and rhythm, no murmurs rubs or gallops.  Abdomen: Bowel sounds are normal, nontender, nondistended, no hepatosplenomegaly or masses, no abdominal bruits or hernia , no rebound or guarding.   Extremities: No lower extremity edema. No clubbing or deformities. Neuro: Alert and oriented x 3.  Grossly intact. Skin: Warm and dry, no jaundice.   Psych: Alert and cooperative, normal mood and affect.   Imaging Studies: No results found.  Assessment and Plan:   Elizabeth Trujillo is a 83 y.o. y/o female here to follow up for diarrhea likely  osmotic secondary to large qty of aritficial sugars in diet previously : significantly improved after d/c sugars.doing well.  Briefly discussed about a colonoscopy which she is not keen on presently similar to her last visit.   Dr Elizabeth Bellows  MD,MRCP [facility]) Follow up PRN

## 2018-05-14 ENCOUNTER — Telehealth: Payer: Self-pay | Admitting: Urology

## 2018-05-14 NOTE — Telephone Encounter (Signed)
Patient called the office today to report gross hematuria.  She feels that the frequency of hematuria has increased.  She confirmed that she completed the abx as prescribed in Jan 2020.  Scheduled appt for patient to see Dr. Erlene Quan on 05/17/2018 in [place].

## 2018-05-16 ENCOUNTER — Ambulatory Visit (INDEPENDENT_AMBULATORY_CARE_PROVIDER_SITE_OTHER): Payer: Medicare Other

## 2018-05-16 VITALS — BP 144/64 | HR 96 | Temp 97.8°F | Ht 63.0 in | Wt 215.4 lb

## 2018-05-16 DIAGNOSIS — Z0000 Encounter for general adult medical examination without abnormal findings: Secondary | ICD-10-CM

## 2018-05-16 NOTE — Progress Notes (Signed)
Subjective:   Elizabeth Trujillo is a 83 y.o. female who presents for Medicare Annual (Subsequent) preventive examination.  Review of Systems:  N/A  Cardiac Risk Factors include: advanced age (>36men, >61 women);dyslipidemia;obesity (BMI >30kg/m2);hypertension     Objective:     Vitals: BP (!) 144/64 (BP Location: Right Arm)   Pulse 96   Temp 97.8 F (36.6 C) (Oral)   Ht 5\' 3"  (1.6 m)   Wt 215 lb 6.4 oz (97.7 kg)   BMI 38.16 kg/m   Body mass index is 38.16 kg/m.  Advanced Directives 05/16/2018 04/24/2017 04/04/2016 11/03/2015 10/20/2015 01/14/2015  Does Patient Have a Medical Advance Directive? Yes Yes Yes Yes Yes Yes  Type of Advance Directive Living will;Healthcare Power of [place];Living will [employer];Living will [place];Living will [place];Living will -  Does patient want to make changes to medical advance directive? - - - No - Patient declined - -  Copy of [place] in Chart? Yes - validated most recent copy scanned in chart (See row information) No - copy requested No - copy requested No - copy requested No - copy requested -    Tobacco Social History   Tobacco Use  Smoking Status Never Smoker  Smokeless Tobacco Never Used     Counseling given: Not Answered   Clinical Intake:  Pre-visit preparation completed: Yes  Pain : No/denies pain Pain Score: 0-No pain     Nutritional Status: BMI > 30  Obese Nutritional Risks: None Diabetes: No  How often do you need to have someone help you when you read instructions, pamphlets, or other written materials from your doctor or pharmacy?: 1 - Never  Interpreter Needed?: No  Information entered by :: [facility], LPN  Past Medical History:  Diagnosis Date  . Arthritis   . Breast cancer ([place]) 1986   left mastectomy  . Cancer ([place]) 1986   left breast  . Elevated triglycerides with high cholesterol   . GERD  (gastroesophageal reflux disease)    history of reflux  . Hypercholesterolemia   . Hypertension    Past Surgical History:  Procedure Laterality Date  . ABDOMINAL HYSTERECTOMY  161096   TAH/BSO  . BREAST BIOPSY Right 01/23/2013   [place] bx/clip-neg  . BREAST SURGERY Left 07/1984   Mastectomy  . callus removed from left toe    . CATARACT EXTRACTION W/ INTRAOCULAR LENS IMPLANT Left 2015  . EYE SURGERY Left 2013  . FOOT SURGERY Left 04/2013  . KNEE ARTHROPLASTY Right 11/03/2015   Procedure: COMPUTER ASSISTED TOTAL KNEE ARTHROPLASTY;  Surgeon: Dereck Leep, MD;  Location: [facility] ORS;  Service: Orthopedics;  Laterality: Right;  . MASTECTOMY Left 1986   positive   Family History  Problem Relation Age of Onset  . Cancer Mother        baldder and breast  . Glaucoma Mother   . Kidney disease Mother   . Arthritis Mother   . Stroke Mother   . Breast cancer Mother 8  . Heart disease Father   . Cancer Brother        lung cancer, smoker  . Breast cancer Brother   . Breast cancer Cousin        3 mat cousins   Social History   Socioeconomic History  . Marital status: Married    Spouse name: Not on file  . Number of children: 3  . Years of education: Not on  file  . Highest education level: Bachelor's degree (e.g., BA, AB, BS)  Occupational History  . Occupation: retired  Scientific laboratory technician  . Financial resource strain: Not hard at all  . Food insecurity:    Worry: Never true    Inability: Never true  . Transportation needs:    Medical: No    Non-medical: No  Tobacco Use  . Smoking status: Never Smoker  . Smokeless tobacco: Never Used  Substance and Sexual Activity  . Alcohol use: No  . Drug use: No  . Sexual activity: Yes  Lifestyle  . Physical activity:    Days per week: 0 days    Minutes per session: 0 min  . Stress: Not at all  Relationships  . Social connections:    Talks on phone: Patient refused    Gets together: Patient refused    Attends religious service: Patient  refused    Active member of club or organization: Patient refused    Attends meetings of clubs or organizations: Patient refused    Relationship status: Patient refused  Other Topics Concern  . Not on file  Social History Narrative  . Not on file    Outpatient Encounter Medications as of 05/16/2018  Medication Sig  . CALCIUM-VITAMIN D PO Take 2 tablets by mouth daily.   . Cholecalciferol 1000 UNITS capsule Take 1,000 Units by mouth at bedtime.   Marland Kitchen ketoconazole (NIZORAL) 2 % shampoo SHAMPOO LEAVE ON FOR FIVE MINUTES THEN RINSE DAILY UNTIL CLEAR THEN WEEKLY FOR MAINTENANCE  . MULTIPLE VITAMIN PO Take 1 tablet by mouth daily.   Marland Kitchen nystatin cream (MYCOSTATIN) Apply 1 application topically 2 (two) times daily.  . rosuvastatin (CRESTOR) 20 MG tablet Take 0.5 tablets (10 mg total) by mouth daily.  Marland Kitchen aspirin 81 MG tablet Take 81 mg by mouth daily.   . ciprofloxacin (CIPRO) 500 MG tablet Take 1 tablet (500 mg total) by mouth 2 (two) times daily. (Patient not taking: Reported on 05/13/2018)  . meloxicam (MOBIC) 15 MG tablet Take 15 mg by mouth daily.   . OMEGA-3 FATTY ACIDS PO Take 1 capsule by mouth daily.    No facility-administered encounter medications on file as of 05/16/2018.     Activities of Daily Living In your present state of health, do you have any difficulty performing the following activities: 05/16/2018  Hearing? Y  Comment Wears bilateral hearing aids.   Vision? N  Difficulty concentrating or making decisions? N  Walking or climbing stairs? Y  Comment Due to knee pains.   Dressing or bathing? N  Doing errands, shopping? N  Preparing Food and eating ? N  Using the Toilet? N  In the past six months, have you accidently leaked urine? Y  Comment Some at night, wears protection.   Do you have problems with loss of bowel control? N  Managing your Medications? N  Managing your Finances? Y  Comment Husband manages finances.   Housekeeping or managing your Housekeeping? N  Comment  Does minimal housework, husband does the heavy cleaning.   Some recent data might be hidden    Patient Care Team: [name]., MD as PCP - General (Family Medicine) Birder Robson, MD as Referring Physician (Ophthalmology) Hollice Espy, MD as Consulting Physician (Urology) Reche Dixon, PA-C as Consulting Physician (Orthopedic Surgery) Jonathon Bellows, MD as Consulting Physician (Gastroenterology) Vladimir Crofts, MD as Consulting Physician (Neurology)    Assessment:   This is a routine wellness examination for Elizabeth Trujillo.  Exercise  Activities and Dietary recommendations Current Exercise Habits: The patient does not participate in regular exercise at present, Exercise limited by: orthopedic condition(s)  Goals    . DIET - INCREASE WATER INTAKE     Recommend increasing water intake to 4-6 glasses a day.     . Increase water intake     Starting 04/04/16, I will increase my water intake to 4-5 glasses a day.       Fall Risk Fall Risk  05/16/2018 04/24/2017 04/04/2016 01/14/2015 01/14/2015  Falls in the past year? 0 Yes No No No  Number falls in past yr: - 1 - - -  Injury with Fall? - No - - -   FALL RISK PREVENTION PERTAINING TO THE HOME: Any stairs in or around the home? Yes  If so, do they handrails? Yes   Home free of loose throw rugs in walkways, pet beds, electrical cords, etc? Yes  Adequate lighting in your home to reduce risk of falls? Yes   ASSISTIVE DEVICES UTILIZED TO PREVENT FALLS:  Life alert? No  Use of a cane, walker or w/c? Yes  Grab bars in the bathroom? Yes  Shower chair or bench in shower? Yes  Elevated toilet seat or a handicapped toilet? Yes    TIMED UP AND GO:  Was the test performed? No .    Depression Screen PHQ 2/9 Scores 05/16/2018 04/24/2017 04/04/2016 01/14/2015  PHQ - 2 Score 0 0 0 0     Cognitive Function: Declined today.      6CIT Screen 04/24/2017 04/04/2016  What Year? 0 points 0 points  What month? 0 points 0 points  What time? 0  points 0 points  Count back from 20 0 points 0 points  Months in reverse 0 points 0 points  Repeat phrase 0 points 0 points  Total Score 0 0    Immunization History  Administered Date(s) Administered  . Influenza, High Dose Seasonal PF 01/14/2015, 12/05/2016, 12/03/2017  . Pneumococcal Conjugate-13 09/08/2013  . Pneumococcal Polysaccharide-23 01/04/2011    Qualifies for Shingles Vaccine? Yes . Due for Shingrix. Education has been provided regarding the importance of this vaccine. Pt has been advised to call insurance company to determine out of pocket expense. Advised may also receive vaccine at local pharmacy or Health Dept. Verbalized acceptance and understanding.  Tdap: Although this vaccine is not a covered service during a Wellness Exam, does the patient still wish to receive this vaccine today?  No .  Education has been provided regarding the importance of this vaccine. Advised may receive this vaccine at local pharmacy or Health Dept. Aware to provide a copy of the vaccination record if obtained from local pharmacy or Health Dept. Verbalized acceptance and understanding.  Flu Vaccine: Up to date  Pneumococcal Vaccine: Up to date  Screening Tests Health Maintenance  Topic Date Due  . TETANUS/TDAP  06/06/1954  . DEXA SCAN  01/05/2018  . INFLUENZA VACCINE  Completed  . PNA vac Low Risk Adult  Completed    Cancer Screenings:  Colorectal Screening: No longer required.   Mammogram: No longer required.   Bone Density: Completed 01/18/16. Results reflect OSTEOPOROSIS. Repeat every 2 years. Pt declined order today and states she would rather wait and let PCP place the order.  Lung Cancer Screening: (Low Dose CT Chest recommended if Age 28-80 years, 30 pack-year currently smoking OR have quit w/in 15years.) does not qualify.    Additional Screening:  Vision Screening: Recommended annual ophthalmology exams for early  detection of glaucoma and other disorders of the  eye.  Dental Screening: Recommended annual dental exams for proper oral hygiene  Community Resource Referral:  CRR required this visit?  No      Plan:  I have personally reviewed and addressed the Medicare Annual Wellness questionnaire and have noted the following in the patient's chart:  A. Medical and social history B. Use of alcohol, tobacco or illicit drugs  C. Current medications and supplements D. Functional ability and status E.  Nutritional status F.  Physical activity G. Advance directives H. List of other physicians I.  Hospitalizations, surgeries, and ER visits in previous 12 months J.  [place] such as hearing and vision if needed, cognitive and depression L. Referrals and appointments - none  In addition, I have reviewed and discussed with patient certain preventive protocols, quality metrics, and best practice recommendations. A written personalized care plan for preventive services as well as general preventive health recommendations were provided to patient.  See attached scanned questionnaire for additional information.   Signed,  Fabio Neighbors, LPN Nurse Health Advisor   Nurse Recommendations: Pt declined the tetanus vaccine and DEXA order today. Pt would like to have this ordered once she sees PCP on 06/13/18.

## 2018-05-16 NOTE — Patient Instructions (Addendum)
Ms. Elizabeth Trujillo , Thank you for taking time to come for your Medicare Wellness Visit. I appreciate your ongoing commitment to your health goals. Please review the following plan we discussed and let me know if I can assist you in the future.   Screening recommendations/referrals: Colonoscopy: No longer required.  Mammogram: No longer required.  Bone Density: Pt declines referral today.  Recommended yearly ophthalmology/optometry visit for glaucoma screening and checkup Recommended yearly dental visit for hygiene and checkup  Vaccinations: Influenza vaccine: Up to date Pneumococcal vaccine: Completed series Tdap vaccine: Pt declines today.  Shingles vaccine: Pt declines today.     Advanced directives: Currently on file.   Conditions/risks identified: Continue trying to increase water intake to 6-8 8 oz glasses a day.   Next appointment: 06/13/18 with Dr Rosanna Randy. Pt declined scheduling the AWV for 2021 at this time.    Preventive Care 10 Years and Older, Female Preventive care refers to lifestyle choices and visits with your health care provider that can promote health and wellness. What does preventive care include?  A yearly physical exam. This is also called an annual well check.  Dental exams once or twice a year.  Routine eye exams. Ask your health care provider how often you should have your eyes checked.  Personal lifestyle choices, including:  Daily care of your teeth and gums.  Regular physical activity.  Eating a healthy diet.  Avoiding tobacco and drug use.  Limiting alcohol use.  Practicing safe sex.  Taking low-dose aspirin every day.  Taking vitamin and mineral supplements as recommended by your health care provider. What happens during an annual well check? The services and screenings done by your health care provider during your annual well check will depend on your age, overall health, lifestyle risk factors, and family history of disease. Counseling    Your health care provider may ask you questions about your:  Alcohol use.  Tobacco use.  Drug use.  Emotional well-being.  Home and relationship well-being.  Sexual activity.  Eating habits.  History of falls.  Memory and ability to understand (cognition).  Work and work Statistician.  Reproductive health. Screening  You may have the following tests or measurements:  Height, weight, and BMI.  Blood pressure.  Lipid and cholesterol levels. These may be checked every 5 years, or more frequently if you are over 76 years old.  Skin check.  Lung cancer screening. You may have this screening every year starting at age 28 if you have a 30-pack-year history of smoking and currently smoke or have quit within the past 15 years.  Fecal occult blood test (FOBT) of the stool. You may have this test every year starting at age 62.  Flexible sigmoidoscopy or colonoscopy. You may have a sigmoidoscopy every 5 years or a colonoscopy every 10 years starting at age 62.  Hepatitis C blood test.  Hepatitis B blood test.  Sexually transmitted disease (STD) testing.  Diabetes screening. This is done by checking your blood sugar (glucose) after you have not eaten for a while (fasting). You may have this done every 1-3 years.  Bone density scan. This is done to screen for osteoporosis. You may have this done starting at age 21.  Mammogram. This may be done every 1-2 years. Talk to your health care provider about how often you should have regular mammograms. Talk with your health care provider about your test results, treatment options, and if necessary, the need for more tests. Vaccines  Your health care  provider may recommend certain vaccines, such as:  Influenza vaccine. This is recommended every year.  Tetanus, diphtheria, and acellular pertussis (Tdap, Td) vaccine. You may need a Td booster every 10 years.  Zoster vaccine. You may need this after age 47.  Pneumococcal 13-valent  conjugate (PCV13) vaccine. One dose is recommended after age 31.  Pneumococcal polysaccharide (PPSV23) vaccine. One dose is recommended after age 50. Talk to your health care provider about which screenings and vaccines you need and how often you need them. This information is not intended to replace advice given to you by your health care provider. Make sure you discuss any questions you have with your health care provider. Document Released: 03/26/2015 Document Revised: 11/17/2015 Document Reviewed: 12/29/2014 Elsevier Interactive Patient Education  2017 [place] Prevention in the Home Falls can cause injuries. They can happen to people of all ages. There are many things you can do to make your home safe and to help prevent falls. What can I do on the outside of my home?  Regularly fix the edges of walkways and driveways and fix any cracks.  Remove anything that might make you trip as you walk through a door, such as a raised step or threshold.  Trim any bushes or trees on the path to your home.  Use bright outdoor lighting.  Clear any walking paths of anything that might make someone trip, such as rocks or tools.  Regularly check to see if handrails are loose or broken. Make sure that both sides of any steps have handrails.  Any raised decks and porches should have guardrails on the edges.  Have any leaves, snow, or ice cleared regularly.  Use sand or salt on walking paths during winter.  Clean up any spills in your garage right away. This includes oil or grease spills. What can I do in the bathroom?  Use night lights.  Install grab bars by the toilet and in the tub and shower. Do not use towel bars as grab bars.  Use non-skid mats or decals in the tub or shower.  If you need to sit down in the shower, use a plastic, non-slip stool.  Keep the floor dry. Clean up any water that spills on the floor as soon as it happens.  Remove soap buildup in the tub or  shower regularly.  Attach bath mats securely with double-sided non-slip rug tape.  Do not have throw rugs and other things on the floor that can make you trip. What can I do in the bedroom?  Use night lights.  Make sure that you have a light by your bed that is easy to reach.  Do not use any sheets or blankets that are too big for your bed. They should not hang down onto the floor.  Have a firm chair that has side arms. You can use this for support while you get dressed.  Do not have throw rugs and other things on the floor that can make you trip. What can I do in the kitchen?  Clean up any spills right away.  Avoid walking on wet floors.  Keep items that you use a lot in easy-to-reach places.  If you need to reach something above you, use a strong step stool that has a grab bar.  Keep electrical cords out of the way.  Do not use floor polish or wax that makes floors slippery. If you must use wax, use non-skid floor wax.  Do not have throw  rugs and other things on the floor that can make you trip. What can I do with my stairs?  Do not leave any items on the stairs.  Make sure that there are handrails on both sides of the stairs and use them. Fix handrails that are broken or loose. Make sure that handrails are as long as the stairways.  Check any carpeting to make sure that it is firmly attached to the stairs. Fix any carpet that is loose or worn.  Avoid having throw rugs at the top or bottom of the stairs. If you do have throw rugs, attach them to the floor with carpet tape.  Make sure that you have a light switch at the top of the stairs and the bottom of the stairs. If you do not have them, ask someone to add them for you. What else can I do to help prevent falls?  Wear shoes that:  Do not have high heels.  Have rubber bottoms.  Are comfortable and fit you well.  Are closed at the toe. Do not wear sandals.  If you use a stepladder:  Make sure that it is fully  opened. Do not climb a closed stepladder.  Make sure that both sides of the stepladder are locked into place.  Ask someone to hold it for you, if possible.  Clearly mark and make sure that you can see:  Any grab bars or handrails.  First and last steps.  Where the edge of each step is.  Use tools that help you move around (mobility aids) if they are needed. These include:  Canes.  Walkers.  Scooters.  Crutches.  Turn on the lights when you go into a dark area. Replace any light bulbs as soon as they burn out.  Set up your furniture so you have a clear path. Avoid moving your furniture around.  If any of your floors are uneven, fix them.  If there are any pets around you, be aware of where they are.  Review your medicines with your doctor. Some medicines can make you feel dizzy. This can increase your chance of falling. Ask your doctor what other things that you can do to help prevent falls. This information is not intended to replace advice given to you by your health care provider. Make sure you discuss any questions you have with your health care provider. Document Released: 12/24/2008 Document Revised: 08/05/2015 Document Reviewed: 04/03/2014 Elsevier Interactive Patient Education  2017 [employer].

## 2018-05-17 ENCOUNTER — Other Ambulatory Visit
Admission: RE | Admit: 2018-05-17 | Discharge: 2018-05-17 | Disposition: A | Discharge status: Home / Self Care | Payer: Medicare Other | Attending: Urology | Admitting: Urology

## 2018-05-17 ENCOUNTER — Other Ambulatory Visit: Payer: Self-pay

## 2018-05-17 ENCOUNTER — Encounter: Payer: Self-pay | Admitting: Urology

## 2018-05-17 ENCOUNTER — Ambulatory Visit (INDEPENDENT_AMBULATORY_CARE_PROVIDER_SITE_OTHER): Payer: Medicare Other | Admitting: Urology

## 2018-05-17 VITALS — BP 132/78 | HR 90 | Ht 63.0 in | Wt 215.0 lb

## 2018-05-17 DIAGNOSIS — N9089 Other specified noninflammatory disorders of vulva and perineum: Secondary | ICD-10-CM

## 2018-05-17 DIAGNOSIS — R310 Gross hematuria: Secondary | ICD-10-CM

## 2018-05-17 DIAGNOSIS — N390 Urinary tract infection, site not specified: Secondary | ICD-10-CM

## 2018-05-17 LAB — URINALYSIS, COMPLETE (UACMP) WITH MICROSCOPIC
Bacteria, UA: NONE SEEN
Bilirubin Urine: NEGATIVE
Glucose, UA: NEGATIVE mg/dL
KETONES UR: NEGATIVE mg/dL
Nitrite: NEGATIVE
Protein, ur: NEGATIVE mg/dL
Specific Gravity, Urine: 1.015 (ref 1.005–1.030)
pH: 7 (ref 5.0–8.0)

## 2018-05-17 NOTE — Progress Notes (Signed)
05/17/2018 6:09 PM   Elizabeth Trujillo Apr 01, 1935 [id number]  Referring provider: [name]., MD [address] [place], [place] [address]  Chief Complaint  Patient presents with  . Hematuria    HPI: 83 year old female who returns again today with complaints of gross hematuria.  She was seen January 2020 for the same issue. She he had a pelvic exam in January which did show significant atrophic vaginitis with urethral caruncle.  UA at that time appeared to be mildly suspicious but urine culture was negative.  She was treated for presumed UTI despite this.  Today, she brings her husband with her as well as some photographs of pink-tinged urine in the toilet.  She also brings in detailed log of when her gross hematuria episodes are occurring, now occurring on a more regular basis as frequently as once per week.  This generally involves one episode of pink-tinged urine which clears up the day.  No clots.  She also continues to have complaints of irritation at her clitoris.  On examination last visit, this appeared to be unremarkable and thought to be secondary to possibly mechanical irritation.  She is also got undergone extensive evaluation including CT urogram and cystoscopy just over a year ago.  This was unremarkable.  UA today appears to be contaminated, 20-50 squamous epithelial cells, 11-20 white blood cells, no red blood cells and no bacteria.  She does note today that she has a family history of bladder cancer, her mother.  She is very anxious about this fact.  Today, she denies any gross hematuria.  She denies any dysuria, urgency frequency or changes from her baseline urinary symptoms.  She reports that the episodes of blood are not related to the episodes of clitoral pain.   PMH: Past Medical History:  Diagnosis Date  . Arthritis   . Breast cancer ([place]) 1986   left mastectomy  . Cancer ([place]) 1986   left breast  . Elevated triglycerides with high  cholesterol   . GERD (gastroesophageal reflux disease)    history of reflux  . Hypercholesterolemia   . Hypertension     Surgical History: Past Surgical History:  Procedure Laterality Date  . ABDOMINAL HYSTERECTOMY  [id number]   TAH/BSO  . BREAST BIOPSY Right 01/23/2013   [place] bx/clip-neg  . BREAST SURGERY Left 07/1984   Mastectomy  . callus removed from left toe    . CATARACT EXTRACTION W/ INTRAOCULAR LENS IMPLANT Left 2015  . EYE SURGERY Left 2013  . FOOT SURGERY Left 04/2013  . KNEE ARTHROPLASTY Right 11/03/2015   Procedure: COMPUTER ASSISTED TOTAL KNEE ARTHROPLASTY;  Surgeon: Dereck Leep, MD;  Location: [facility] ORS;  Service: Orthopedics;  Laterality: Right;  . MASTECTOMY Left 1986   positive    Home Medications:  Allergies as of 05/17/2018      Reactions   Penicillin V    Penicillin V Potassium Hives   Has patient had a PCN reaction causing immediate rash, facial/tongue/throat swelling, SOB or lightheadedness with hypotension: no Has patient had a PCN reaction causing severe rash involving mucus membranes or skin necrosis: {no Has patient had a PCN reaction that required hospitalization no Has patient had a PCN reaction occurring within the last 10 years: no If all of the above answers are "NO", then may proceed with Cephalosporin use.   Sulfa Antibiotics Hives      Medication List       Accurate as of May 17, 2018  6:09 PM.  Always use your most recent med list.        aspirin 81 MG tablet Take 81 mg by mouth daily.   Cholecalciferol 25 MCG (1000 UT) capsule Take 1,000 Units by mouth at bedtime.   ketoconazole 2 % shampoo Commonly known as:  NIZORAL SHAMPOO LEAVE ON FOR FIVE MINUTES THEN RINSE DAILY UNTIL CLEAR THEN WEEKLY FOR MAINTENANCE   meloxicam 15 MG tablet Commonly known as:  MOBIC Take 15 mg by mouth daily.   MULTIPLE VITAMIN PO Take 1 tablet by mouth daily.   nystatin cream Commonly known as:  MYCOSTATIN Apply 1 application topically 2 (two) times  daily.   OMEGA-3 FATTY ACIDS PO Take 1 capsule by mouth daily.   rosuvastatin 20 MG tablet Commonly known as:  CRESTOR Take 0.5 tablets (10 mg total) by mouth daily.       Allergies:  Allergies  Allergen Reactions  . Penicillin V   . Penicillin V Potassium Hives    Has patient had a PCN reaction causing immediate rash, facial/tongue/throat swelling, SOB or lightheadedness with hypotension: no Has patient had a PCN reaction causing severe rash involving mucus membranes or skin necrosis: {no Has patient had a PCN reaction that required hospitalization no Has patient had a PCN reaction occurring within the last 10 years: no If all of the above answers are "NO", then may proceed with Cephalosporin use.  . Sulfa Antibiotics Hives    Family History: Family History  Problem Relation Age of Onset  . Cancer Mother        baldder and breast  . Glaucoma Mother   . Kidney disease Mother   . Arthritis Mother   . Stroke Mother   . Breast cancer Mother 65  . Heart disease Father   . Cancer Brother        lung cancer, smoker  . Breast cancer Brother   . Breast cancer Cousin        3 mat cousins    Social History:  reports that she has never smoked. She has never used smokeless tobacco. She reports that she does not drink alcohol or use drugs.  ROS: UROLOGY Frequent Urination?: No Hard to postpone urination?: No Burning/pain with urination?: No Get up at night to urinate?: No Leakage of urine?: No Urine stream starts and stops?: No Trouble starting stream?: No Do you have to strain to urinate?: No Blood in urine?: Yes Urinary tract infection?: No Sexually transmitted disease?: No Injury to kidneys or bladder?: No Painful intercourse?: No Weak stream?: No Currently pregnant?: No Vaginal bleeding?: No Last menstrual period?: n  Gastrointestinal Nausea?: No Vomiting?: No Indigestion/heartburn?: No Diarrhea?: No Constipation?: No  Constitutional Fever: No Night  sweats?: No Weight loss?: No Fatigue?: No  Skin Skin rash/lesions?: No Itching?: No  Eyes Blurred vision?: No Double vision?: No  Ears/Nose/Throat Sore throat?: No Sinus problems?: No  Hematologic/Lymphatic Swollen glands?: No Easy bruising?: No  Cardiovascular Leg swelling?: No Chest pain?: No  Respiratory Cough?: No Shortness of breath?: No  Endocrine Excessive thirst?: No  Musculoskeletal Back pain?: No Joint pain?: No  Neurological Headaches?: No Dizziness?: No  Psychologic Depression?: No Anxiety?: No  Physical Exam: BP 132/78   Pulse 90   Ht 5\' 3"  (1.6 m)   Wt 215 lb (97.5 kg)   BMI 38.09 kg/m   Constitutional:  Alert and oriented, No acute distress.  Accompanied by husband today. HEENT: [place] AT, moist mucus membranes.  Trachea midline, no masses. Cardiovascular: No clubbing,  cyanosis, or edema. Respiratory: Normal respiratory effort, no increased work of breathing. Skin: No rashes, bruises or suspicious lesions. Neurologic: Grossly intact, no focal deficits, moving all 4 extremities. Psychiatric: Normal mood and affect.  Laboratory Data: Lab Results  Component Value Date   WBC 9.3 05/18/2017   HGB 14.2 05/18/2017   HCT 43.2 05/18/2017   MCV 92 05/18/2017   PLT 325 05/18/2017    Lab Results  Component Value Date   CREATININE 0.57 11/26/2017    Lab Results  Component Value Date   HGBA1C 6.0 (A) 12/03/2017    Urinalysis    Component Value Date/Time   COLORURINE STRAW (A) 05/17/2018 1138   APPEARANCEUR CLOUDY (A) 05/17/2018 1138   LABSPEC 1.015 05/17/2018 1138   PHURINE 7.0 05/17/2018 1138   GLUCOSEU NEGATIVE 05/17/2018 1138   HGBUR MODERATE (A) 05/17/2018 1138   BILIRUBINUR NEGATIVE 05/17/2018 1138   BILIRUBINUR neg 07/30/2017 1406   KETONESUR NEGATIVE 05/17/2018 1138   PROTEINUR NEGATIVE 05/17/2018 1138   UROBILINOGEN 0.2 07/30/2017 1406   NITRITE NEGATIVE 05/17/2018 1138   LEUKOCYTESUR LARGE (A) 05/17/2018 1138    Lab  Results  Component Value Date   BACTERIA NONE SEEN 05/17/2018   Assessment & Plan:    1. Clitoral irritation Etiology unclear, previous pelvic exam was unremarkable I have recommended that she follow-up with her GYN provider for reassessment We will repeat her pelvic exam next week at time of cystoscopy, see below Suspect this is unrelated to her gross hematuria given there unrelated timing  2. Gross hematuria Recurrent episodes of mild gross hematuria UA today appears contaminated rather than infected Previous UAs have also been without evidence of bacterial cystitis Given its been a year since her last cystoscopy and her family history of bladder cancer, does seem reasonable to repeat her cystoscopy I have arranged for her to return next week for this procedure She is agreeable this plan  Follow-up in 1 week for cystoscopy/pelvic exam, no need to repeat urine next week  Hollice Espy, MD  [place] [address] [place], [place] [address] [phone]

## 2018-05-18 LAB — URINE CULTURE: Culture: NO GROWTH

## 2018-05-20 ENCOUNTER — Other Ambulatory Visit: Payer: Self-pay | Admitting: Family Medicine

## 2018-05-20 DIAGNOSIS — Z1231 Encounter for screening mammogram for malignant neoplasm of breast: Secondary | ICD-10-CM

## 2018-05-24 ENCOUNTER — Encounter: Payer: Self-pay | Admitting: Urology

## 2018-05-24 ENCOUNTER — Ambulatory Visit: Payer: Medicare Other | Admitting: Urology

## 2018-05-24 ENCOUNTER — Other Ambulatory Visit: Payer: Self-pay

## 2018-05-24 ENCOUNTER — Ambulatory Visit (INDEPENDENT_AMBULATORY_CARE_PROVIDER_SITE_OTHER): Payer: Medicare Other | Admitting: Urology

## 2018-05-24 VITALS — BP 145/75 | HR 87 | Ht 63.0 in | Wt 215.0 lb

## 2018-05-24 DIAGNOSIS — R310 Gross hematuria: Secondary | ICD-10-CM

## 2018-05-24 NOTE — Progress Notes (Signed)
   05/24/18  CC:  Chief Complaint  Patient presents with  . Cysto    HPI: 83 year old female with personal history of episodes of painless gross hematuria who returns today for repeat cystoscopy.  She underwent a extensive evaluation a year ago including CT urogram and cystoscopy which is negative.  She continues to have intermittent episodes of bleeding of unclear etiology.  Vitals:   05/24/18 1413  BP: (!) 145/75  Pulse: 87    NED. A&Ox3.   No respiratory distress   Abd soft, NT, ND Normal external genitalia with patent urethral meatus  Cystoscopy Procedure Note  Patient identification was confirmed, informed consent was obtained, and patient was prepped using Betadine solution.  Lidocaine jelly was administered per urethral meatus.    Procedure: - Flexible cystoscope introduced, without any difficulty.   - Thorough search of the bladder revealed:    normal urethral meatus    normal urothelium    no stones    no ulcers     no tumors    no urethral polyps    no trabeculation  - Ureteral orifices were normal in position and appearance.  Post-Procedure: - Patient tolerated the procedure well  Assessment/ Plan:  1. Gross hematuria No clear pathology identified on cystoscopy today We will repeat renal ultrasound for upper tract imaging given ongoing recurrent episodes of gross hematuria - US RENAL; Future  Hollice Espy, MD

## 2018-05-27 ENCOUNTER — Encounter: Payer: Self-pay | Admitting: Urology

## 2018-06-03 ENCOUNTER — Ambulatory Visit: Payer: Medicare Other

## 2018-06-13 ENCOUNTER — Ambulatory Visit: Payer: Self-pay | Admitting: Family Medicine

## 2018-06-20 ENCOUNTER — Ambulatory Visit: Payer: Self-pay | Admitting: Family Medicine

## 2018-07-09 ENCOUNTER — Telehealth: Payer: Self-pay | Admitting: Urology

## 2018-07-09 NOTE — Telephone Encounter (Signed)
Patient called the office today.  She states that she is having increased pain that she has mentioned to you in the past.  She says that she would like to move up the ultrasound and follow up appointment..and/or requesting a referral to [facility] (Dr. Wynetta Emery).  Please advise on how she should proceed.

## 2018-07-09 NOTE — Telephone Encounter (Signed)
That's what I explained to her and she said she would call them back   thanks

## 2018-07-09 NOTE — Telephone Encounter (Signed)
She was referred for clitoral/ irritatoin pain.  Please call [facility] and see if they will see her sooner.  Renal ultrasound is for further evaluation of gross hematuria.  I am happy to move that up but it does not really have anything to do with her current symptoms.  Please call scheduling and see if they will move up the study as well.  Hollice Espy, MD

## 2018-07-09 NOTE — Telephone Encounter (Signed)
Looking back at my documentation, she was already an established patient there so I asked her just to call and make an appointment.  That is how we left it.  Hollice Espy, MD

## 2018-07-09 NOTE — Telephone Encounter (Signed)
There was never a referral put in for [facility] they said they can not see her without one. Once it has been placed I will send it over. She said she was not as concerned about the [place] as she was about the referral.  Thanks, Sharyn Lull

## 2018-07-11 ENCOUNTER — Other Ambulatory Visit: Payer: Self-pay

## 2018-07-11 ENCOUNTER — Encounter: Payer: Self-pay | Admitting: Obstetrics and Gynecology

## 2018-07-11 ENCOUNTER — Ambulatory Visit (INDEPENDENT_AMBULATORY_CARE_PROVIDER_SITE_OTHER): Payer: Medicare Other | Admitting: Obstetrics and Gynecology

## 2018-07-11 VITALS — BP 134/78 | Ht 64.0 in | Wt 222.0 lb

## 2018-07-11 DIAGNOSIS — R102 Pelvic and perineal pain unspecified side: Secondary | ICD-10-CM

## 2018-07-11 NOTE — Progress Notes (Signed)
Obstetrics & Gynecology Office Visit   Chief Complaint:  Chief Complaint  Patient presents with  . Pelvic Pain  . Referral  Referral from Dr. Miguel Aschoff, MD, from [facility] for severe pelvic pain  History of Present Illness: 83 y.o. [phone] female who was seen in referral from Dr. Miguel Aschoff from [facility] for pelvic pain.  The patient has a history significant for breast cancer in the 1980s, which was reportedly estrogen receptor positive.  She also has a history significant for hematuria, which has been thoroughly investigated by Dr. Erlene Quan at [facility].  No etiology for her hematuria has been found.  She presents today with a pain that she describes as being located above her urethral meatus, around the area of her clitoris.  The pain is been present since about January of this year.  The pain was intermittent and did not occur every day until about 1-1/2 weeks ago.  Now the pain occurs daily.  She describes the pain as throbbing and stabbing.  She states that the pain is quite severe.  The pain does not radiate.  Alleviating factors include Tylenol.  But, she is unsure of how much benefit she actually gets from Tylenol.  Aggravating factors: She believes this is hard to pinpoint.  She feels like it occurs both at rest and after activity, but more often after activity.  She does continue to have some blood that she believes is coming from your urethra.  She was told that she has a caruncle.  Area of pain is not painful to touch.  She denies bleeding from that area.  She denies any associated symptoms with the pain.  Of note, she is also status total abdominal hysterectomy and bilateral salpingo-oophorectomy.  Today she also notes pelvic pressure.  This is a sensation she has when she has a urinary tract infection.  She denies frequency, dysuria, malodorous urine, or any other symptom.  Past Medical History:  Diagnosis Date  .  Arthritis   . Breast cancer ([place]) 1986   left mastectomy  . Cancer ([place]) 1986   left breast  . Elevated triglycerides with high cholesterol   . GERD (gastroesophageal reflux disease)    history of reflux  . Hypercholesterolemia   . Hypertension     Past Surgical History:  Procedure Laterality Date  . ABDOMINAL HYSTERECTOMY  [id number]   TAH/BSO  . BREAST BIOPSY Right 01/23/2013   [place] bx/clip-neg  . BREAST SURGERY Left 07/1984   Mastectomy  . callus removed from left toe    . CATARACT EXTRACTION W/ INTRAOCULAR LENS IMPLANT Left 2015  . EYE SURGERY Left 2013  . FOOT SURGERY Left 04/2013  . KNEE ARTHROPLASTY Right 11/03/2015   Procedure: COMPUTER ASSISTED TOTAL KNEE ARTHROPLASTY;  Surgeon: Dereck Leep, MD;  Location: [facility] ORS;  Service: Orthopedics;  Laterality: Right;  . MASTECTOMY Left 1986   positive    Gynecologic History: No LMP recorded. Patient has had a hysterectomy.  Obstetric History: [id number]  Family History  Problem Relation Age of Onset  . Cancer Mother        baldder and breast  . Glaucoma Mother   . Kidney disease Mother   . Arthritis Mother   . Stroke Mother   . Breast cancer Mother 64  . Heart disease Father   . Cancer Brother        lung cancer, smoker  . Breast cancer Brother   . Breast cancer Cousin  3 mat cousins    Social History   Socioeconomic History  . Marital status: Married    Spouse name: Not on file  . Number of children: 3  . Years of education: Not on file  . Highest education level: Bachelor's degree (e.g., BA, AB, BS)  Occupational History  . Occupation: retired  Scientific laboratory technician  . Financial resource strain: Not hard at all  . Food insecurity:    Worry: Never true    Inability: Never true  . Transportation needs:    Medical: No    Non-medical: No  Tobacco Use  . Smoking status: Never Smoker  . Smokeless tobacco: Never Used  Substance and Sexual Activity  . Alcohol use: No  . Drug use: No  . Sexual activity: Yes   Lifestyle  . Physical activity:    Days per week: 0 days    Minutes per session: 0 min  . Stress: Not at all  Relationships  . Social connections:    Talks on phone: Patient refused    Gets together: Patient refused    Attends religious service: Patient refused    Active member of club or organization: Patient refused    Attends meetings of clubs or organizations: Patient refused    Relationship status: Patient refused  . Intimate partner violence:    Fear of current or ex partner: Patient refused    Emotionally abused: Patient refused    Physically abused: Patient refused    Forced sexual activity: Patient refused  Other Topics Concern  . Not on file  Social History Narrative  . Not on file    Allergies  Allergen Reactions  . Penicillin V   . Penicillin V Potassium Hives    Has patient had a PCN reaction causing immediate rash, facial/tongue/throat swelling, SOB or lightheadedness with hypotension: no Has patient had a PCN reaction causing severe rash involving mucus membranes or skin necrosis: {no Has patient had a PCN reaction that required hospitalization no Has patient had a PCN reaction occurring within the last 10 years: no If all of the above answers are "NO", then may proceed with Cephalosporin use.  . Sulfa Antibiotics Hives    Prior to Admission medications   Medication Sig Start Date End Date Taking? Authorizing Provider  aspirin 81 MG tablet Take 81 mg by mouth daily.  12/12/12   [provider]  Cholecalciferol 1000 UNITS capsule Take 1,000 Units by mouth at bedtime.  12/12/12   [provider]  ketoconazole (NIZORAL) 2 % shampoo SHAMPOO LEAVE ON FOR FIVE MINUTES THEN RINSE DAILY UNTIL CLEAR THEN WEEKLY FOR MAINTENANCE 04/18/18   [provider]  MULTIPLE VITAMIN PO Take 1 tablet by mouth daily.  12/12/12   [provider]  nystatin cream (MYCOSTATIN) Apply 1 application topically 2 (two) times daily. 12/06/17   [name]., MD  OMEGA-3 FATTY ACIDS PO Take 1 capsule by mouth daily.  12/12/12   [provider]  rosuvastatin (CRESTOR) 20 MG tablet Take 0.5 tablets (10 mg total) by mouth daily. 06/06/17   [name]., MD    Review of Systems  Constitutional: Negative.   HENT: Negative.   Eyes: Negative.   Respiratory: Negative.   Cardiovascular: Negative.   Gastrointestinal: Negative.   Genitourinary: Negative.   Musculoskeletal: Negative.   Skin: Negative.   Neurological: Negative.   Psychiatric/Behavioral: Negative.      Physical Exam BP 134/78   Ht 5\' 4"  (1.626 m)  Wt 222 lb (100.7 kg)   BMI 38.11 kg/m  No LMP recorded. Patient has had a hysterectomy. Physical Exam Constitutional:      General: She is not in acute distress.    Appearance: Normal appearance. She is well-developed.  Genitourinary:     Pelvic exam was performed with patient in the lithotomy position.     Vulva, inguinal canal, urethra, bladder, vagina, right adnexa and left adnexa normal.     No posterior fourchette tenderness, injury or lesion present.     Urethral caruncle present.     Cervix is absent.     Uterus is absent.     Genitourinary Comments: The bimanual examination is limited by the patient's body habitus  HENT:     Head: Normocephalic and atraumatic.  Eyes:     General: No scleral icterus.    Conjunctiva/sclera: Conjunctivae normal.  Neck:     Musculoskeletal: Normal range of motion and neck supple.  Cardiovascular:     Rate and Rhythm: Normal rate and regular rhythm.     Heart sounds: No murmur. No friction rub. No gallop.   Pulmonary:     Effort: Pulmonary effort is normal. No respiratory distress.     Breath sounds: Normal breath sounds. No wheezing or rales.  Abdominal:     General: Bowel sounds are normal. There is no distension.     Palpations: Abdomen is soft. There is no mass.     Tenderness: There is no abdominal tenderness. There is no guarding or rebound.   Musculoskeletal: Normal range of motion.  Neurological:     General: No focal deficit present.     Mental Status: She is alert and oriented to person, place, and time.     Cranial Nerves: No cranial nerve deficit.  Skin:    General: Skin is warm and dry.     Findings: No erythema.  Psychiatric:        Mood and Affect: Mood normal.        Behavior: Behavior normal.        Judgment: Judgment normal.    Female chaperone present for pelvic and breast  portions of the physical exam  Assessment: 83 y.o. [id number] female here for  1. Pelvic pain   2. Pelvic pressure in female      Plan: Problem List Items Addressed This Visit    None    Visit Diagnoses    Pelvic pain    -  Primary   Pelvic pressure in female       Relevant Orders   Urine Culture     I was unable to appreciate any abnormality on her exam today that might explain her pain.  Her exam was unremarkable.  Given the lack of reproducibility of her pain, it is possible that the pain is neurologic in origin.  In either case, the etiology is difficult to ascertain.  It is possible that her pain is part of a syndrome related to her menopausal state.  However, she has been menopausal for quite some time and she is not a good candidate for estrogen, given her history of estrogen receptor positive breast cancer.  We discussed all of this in detail.  We discussed that if any new signs or symptoms should develop, she should feel free to return for further evaluation.  20 minutes spent in face to face discussion with > 50% spent in counseling,management, and coordination of care of her pelvic pain.   Prentice Docker, MD 07/11/2018  5:20 PM     CC: Hollice Espy, MD [facility]  [name]., MD [address] [place], [place] [address]

## 2018-07-13 LAB — URINE CULTURE

## 2018-07-18 ENCOUNTER — Encounter: Payer: Self-pay | Admitting: Family Medicine

## 2018-07-18 ENCOUNTER — Other Ambulatory Visit: Payer: Self-pay

## 2018-07-18 ENCOUNTER — Ambulatory Visit (INDEPENDENT_AMBULATORY_CARE_PROVIDER_SITE_OTHER): Payer: Medicare Other | Admitting: Family Medicine

## 2018-07-18 VITALS — BP 142/72 | HR 82 | Temp 98.1°F | Resp 16 | Wt 222.0 lb

## 2018-07-18 DIAGNOSIS — R102 Pelvic and perineal pain: Secondary | ICD-10-CM | POA: Diagnosis not present

## 2018-07-18 DIAGNOSIS — E7800 Pure hypercholesterolemia, unspecified: Secondary | ICD-10-CM

## 2018-07-18 DIAGNOSIS — M7989 Other specified soft tissue disorders: Secondary | ICD-10-CM | POA: Diagnosis not present

## 2018-07-18 DIAGNOSIS — R739 Hyperglycemia, unspecified: Secondary | ICD-10-CM

## 2018-07-18 DIAGNOSIS — R109 Unspecified abdominal pain: Secondary | ICD-10-CM | POA: Diagnosis not present

## 2018-07-18 MED ORDER — GABAPENTIN 100 MG PO CAPS: 100.0000 mg | ORAL_CAPSULE | Freq: Two times a day (BID) | ORAL | 3 refills | Status: DC

## 2018-07-18 NOTE — Progress Notes (Signed)
[name] Female    DOB: [birth year]   83 y.o.   MRN: [id number] Visit Date: 07/18/2018  Today's Provider: [name], MD   Chief Complaint  Patient presents with  . Pelvic Pain  . Hypertension   Subjective:     HPI Patient comes in today c/o pain and blood in her urine. She reports that she has dealt with this for several weeks. She has had an extensive work up with urology and gynecology, and reports that everything was negative. She reports that her pain has gotten worse over the last week.   She is also due for her routine check.  Lipid/Cholesterol, Follow-up:   Last seen for this4 months ago.  Management changes since that visit include no changes. . Last Lipid Panel:    Component Value Date/Time   CHOL 188 05/18/2017 1217   TRIG 239 (H) 05/18/2017 1217   HDL 49 05/18/2017 1217   CHOLHDL 3.8 05/18/2017 1217   CHOLHDL 4.1 12/05/2016 1545   LDLCALC 91 05/18/2017 1217   LDLCALC 101 (H) 12/05/2016 1545    She reports good compliance with treatment. She is not having side effects.  Current symptoms include none and have been stable. Weight trend: stable Prior visit with dietician: no Current diet: well balanced Current exercise: no regular exercise  Wt Readings from Last 3 Encounters:  07/18/18 222 lb (100.7 kg)  07/11/18 222 lb (100.7 kg)  05/24/18 215 lb (97.5 kg)    Allergies  Allergen Reactions  . Penicillin V   . Penicillin V Potassium Hives    Has patient had a PCN reaction causing immediate rash, facial/tongue/throat swelling, SOB or lightheadedness with hypotension: no Has patient had a PCN reaction causing severe rash involving mucus membranes or skin necrosis: {no Has patient had a PCN reaction that required hospitalization no Has patient had a PCN reaction occurring within the last 10 years: no If all of the above answers are "NO", then may proceed with Cephalosporin use.  . Sulfa Antibiotics Hives     Current  Outpatient Medications:  .  aspirin 81 MG tablet, Take 81 mg by mouth daily. , Disp: , Rfl:  .  Cholecalciferol 1000 UNITS capsule, Take 1,000 Units by mouth at bedtime. , Disp: , Rfl:  .  ketoconazole (NIZORAL) 2 % shampoo, SHAMPOO LEAVE ON FOR FIVE MINUTES THEN RINSE DAILY UNTIL CLEAR THEN WEEKLY FOR MAINTENANCE, Disp: , Rfl:  .  meloxicam (MOBIC) 15 MG tablet, Take 15 mg by mouth daily., Disp: , Rfl:  .  MULTIPLE VITAMIN PO, Take 1 tablet by mouth daily. , Disp: , Rfl:  .  nystatin cream (MYCOSTATIN), Apply 1 application topically 2 (two) times daily., Disp: 30 g, Rfl: 1 .  OMEGA-3 FATTY ACIDS PO, Take 1 capsule by mouth daily. , Disp: , Rfl:  .  rosuvastatin (CRESTOR) 20 MG tablet, Take 0.5 tablets (10 mg total) by mouth daily., Disp: 30 tablet, Rfl: 12  Review of Systems  Constitutional: Positive for fatigue. Negative for activity change, appetite change, chills and fever.  Eyes: Negative.   Respiratory: Negative for cough and shortness of breath.   Cardiovascular: Negative for leg swelling.  Gastrointestinal: Negative for abdominal pain, blood in stool, constipation, diarrhea, nausea, rectal pain and vomiting.  Endocrine: Negative.   Genitourinary: Positive for difficulty urinating, frequency, hematuria, pelvic pain, urgency and vaginal pain.  Musculoskeletal: Positive for arthralgias and myalgias.  Allergic/Immunologic: Negative.   Neurological: Negative for dizziness,  light-headedness and headaches.  Psychiatric/Behavioral: Negative for self-injury, sleep disturbance and suicidal ideas. The patient is not nervous/anxious.     Social History   Tobacco Use  . Smoking status: Never Smoker  . Smokeless tobacco: Never Used  Substance Use Topics  . Alcohol use: No      Objective:   BP (!) 142/72 (BP Location: Right Arm, Patient Position: Sitting, Cuff Size: Large)   Pulse 82   Temp 98.1 F (36.7 C)   Resp 16   Wt 222 lb (100.7 kg)   SpO2 98%   BMI 38.11 kg/m  Vitals:    07/18/18 1514  BP: (!) 142/72  Pulse: 82  Resp: 16  Temp: 98.1 F (36.7 C)  SpO2: 98%  Weight: 222 lb (100.7 kg)     Physical Exam Vitals signs reviewed.  Constitutional:      Appearance: Normal appearance.  HENT:     Right Ear: External ear normal.     Left Ear: External ear normal.  Eyes:     General: No scleral icterus.    Conjunctiva/sclera: Conjunctivae normal.  Cardiovascular:     Rate and Rhythm: Normal rate and regular rhythm.     Heart sounds: Normal heart sounds.  Pulmonary:     Effort: Pulmonary effort is normal.     Breath sounds: Normal breath sounds.  Abdominal:     Palpations: Abdomen is soft.  Skin:    General: Skin is warm and dry.  Neurological:     Mental Status: She is alert and oriented to person, place, and time.  Psychiatric:        Mood and Affect: Mood normal.        Behavior: Behavior normal.        Thought Content: Thought content normal.        Judgment: Judgment normal.    Right calf 16 inches Left calf 16.5 inches     Assessment & Plan    1. Pelvic pain Negative w/u with Gyn and urology.More than 50% of 25 minute visit spent in counseling/coordination of care. - gabapentin (NEURONTIN) 100 MG capsule; Take 1 capsule (100 mg total) by mouth 2 (two) times daily.  Dispense: 60 capsule; Refill: 3 - US Pelvis Complete; Future  2. Abdominal pain, unspecified abdominal location Obtain US due to persistent pain - CBC with Differential/Platelet - Comprehensive metabolic panel - US Abdomen Complete; Future  3. Leg swelling R/o DVT. - US Venous Img Lower Unilateral Left; Future  4. Hypercholesterolemia  - Lipid panel - TSH  5. Hyperglycemia  - Hemoglobin A1c     [name], MD  [facility]

## 2018-07-19 ENCOUNTER — Telehealth: Payer: Self-pay | Admitting: Family Medicine

## 2018-07-19 DIAGNOSIS — R102 Pelvic and perineal pain: Secondary | ICD-10-CM

## 2018-07-19 NOTE — Telephone Encounter (Signed)
Pt's wants to know if you want her to get abdominal and pelvic ultrasound done on 07/24/18. She was under the impression that she would try out medication that was prescribed before the test was done.If you want to proceed with pelvic ultrasound [facility] would like you to add ultrasound transvaginal [id number]

## 2018-07-19 NOTE — Telephone Encounter (Signed)
Elizabeth Trujillo! I have talked with Dr. Rosanna Randy, and yes, he would like to add the transvaginal US. I have placed the order.  I also spoke with the patient to reassure her that Dr. Rosanna Randy recommends that we go ahead and do all the ultrasounds prior to her appt. Her office visit is only 24hrs after she has had the tests, so we should know if the medication is beneficial by then.   Thanks!

## 2018-07-23 ENCOUNTER — Telehealth: Payer: Self-pay

## 2018-07-23 NOTE — Telephone Encounter (Signed)
Patient called office requesting for Rachelle to give her a call back. Patient would not disclose what message was about. KW

## 2018-07-23 NOTE — Telephone Encounter (Signed)
Patient reports that she is taking the gabapentin that was prescribed last week and she is feeling much better. She reports that the pharmacist advised her that it may adjust some of her lab levels once she has labs done. Does she need to stop the medication a day or so before she has blood work done? Please advise. Thanks!

## 2018-07-23 NOTE — Telephone Encounter (Signed)
No--stay on gabapentin

## 2018-07-23 NOTE — Telephone Encounter (Signed)
Pt advised.   Thanks,   -Gavriel Holzhauer  

## 2018-07-24 ENCOUNTER — Ambulatory Visit: Admission: RE | Admit: 2018-07-24 | Payer: Medicare Other | Source: Ambulatory Visit

## 2018-07-25 ENCOUNTER — Ambulatory Visit: Payer: Self-pay | Admitting: Family Medicine

## 2018-07-25 ENCOUNTER — Ambulatory Visit
Admission: RE | Admit: 2018-07-25 | Discharge: 2018-07-25 | Disposition: A | Discharge status: Home / Self Care | Payer: Medicare Other | Source: Ambulatory Visit | Attending: Family Medicine | Admitting: Family Medicine

## 2018-07-25 ENCOUNTER — Other Ambulatory Visit
Admission: RE | Admit: 2018-07-25 | Discharge: 2018-07-25 | Disposition: A | Discharge status: Home / Self Care | Payer: Medicare Other | Source: Home / Self Care | Attending: Family Medicine | Admitting: Family Medicine

## 2018-07-25 ENCOUNTER — Encounter: Payer: Self-pay | Admitting: Family Medicine

## 2018-07-25 ENCOUNTER — Other Ambulatory Visit: Payer: Self-pay

## 2018-07-25 ENCOUNTER — Ambulatory Visit (INDEPENDENT_AMBULATORY_CARE_PROVIDER_SITE_OTHER): Payer: Medicare Other | Admitting: Family Medicine

## 2018-07-25 VITALS — BP 126/64 | HR 78 | Temp 98.5°F | Resp 16 | Wt 222.0 lb

## 2018-07-25 DIAGNOSIS — N1330 Unspecified hydronephrosis: Secondary | ICD-10-CM

## 2018-07-25 DIAGNOSIS — M7989 Other specified soft tissue disorders: Secondary | ICD-10-CM | POA: Insufficient documentation

## 2018-07-25 DIAGNOSIS — R109 Unspecified abdominal pain: Secondary | ICD-10-CM | POA: Insufficient documentation

## 2018-07-25 DIAGNOSIS — R102 Pelvic and perineal pain unspecified side: Secondary | ICD-10-CM

## 2018-07-25 DIAGNOSIS — R600 Localized edema: Secondary | ICD-10-CM | POA: Diagnosis not present

## 2018-07-25 DIAGNOSIS — R739 Hyperglycemia, unspecified: Secondary | ICD-10-CM | POA: Insufficient documentation

## 2018-07-25 DIAGNOSIS — E7800 Pure hypercholesterolemia, unspecified: Secondary | ICD-10-CM | POA: Insufficient documentation

## 2018-07-25 DIAGNOSIS — M25562 Pain in left knee: Secondary | ICD-10-CM | POA: Diagnosis not present

## 2018-07-25 DIAGNOSIS — M25511 Pain in right shoulder: Secondary | ICD-10-CM | POA: Diagnosis not present

## 2018-07-25 DIAGNOSIS — G8929 Other chronic pain: Secondary | ICD-10-CM

## 2018-07-25 LAB — COMPREHENSIVE METABOLIC PANEL
ALT: 18 U/L (ref 0–44)
AST: 19 U/L (ref 15–41)
Albumin: 3.8 g/dL (ref 3.5–5.0)
Alkaline Phosphatase: 62 U/L (ref 38–126)
Anion gap: 4 — ABNORMAL LOW (ref 5–15)
BUN: 17 mg/dL (ref 8–23)
CO2: 27 mmol/L (ref 22–32)
Calcium: 9.1 mg/dL (ref 8.9–10.3)
Chloride: 109 mmol/L (ref 98–111)
Creatinine, Ser: 0.65 mg/dL (ref 0.44–1.00)
GFR calc Af Amer: >60 mL/min (ref >60)
GFR calc non Af Amer: >60 mL/min (ref >60)
Glucose, Bld: 114 mg/dL — ABNORMAL HIGH (ref 70–99)
Potassium: 4 mmol/L (ref 3.5–5.1)
Sodium: 140 mmol/L (ref 135–145)
Total Bilirubin: 0.8 mg/dL (ref 0.3–1.2)
Total Protein: 7 g/dL (ref 6.5–8.1)

## 2018-07-25 LAB — CBC WITH DIFFERENTIAL/PLATELET
Abs Immature Granulocytes: 0.04 10*3/uL (ref 0.00–0.07)
Basophils Absolute: 0.1 10*3/uL (ref 0.0–0.1)
Basophils Relative: 1 %
Eosinophils Absolute: 0.2 10*3/uL (ref 0.0–0.5)
Eosinophils Relative: 2 %
HCT: 42.2 % (ref 36.0–46.0)
Hemoglobin: 13.9 g/dL (ref 12.0–15.0)
Immature Granulocytes: 0 %
Lymphocytes Relative: 19 %
Lymphs Abs: 2 10*3/uL (ref 0.7–4.0)
MCH: 30.8 pg (ref 26.0–34.0)
MCHC: 32.9 g/dL (ref 30.0–36.0)
MCV: 93.6 fL (ref 80.0–100.0)
Monocytes Absolute: 1.1 10*3/uL — ABNORMAL HIGH (ref 0.1–1.0)
Monocytes Relative: 10 %
Neutro Abs: 7 10*3/uL (ref 1.7–7.7)
Neutrophils Relative %: 68 %
Platelets: 255 10*3/uL (ref 150–400)
RBC: 4.51 MIL/uL (ref 3.87–5.11)
RDW: 13.7 % (ref 11.5–15.5)
WBC: 10.3 10*3/uL (ref 4.0–10.5)
nRBC: 0 % (ref 0.0–0.2)

## 2018-07-25 LAB — LIPID PANEL
Cholesterol: 204 mg/dL — ABNORMAL HIGH (ref 0–200)
HDL: 48 mg/dL (ref >40)
LDL Cholesterol: 85 mg/dL (ref 0–99)
Total CHOL/HDL Ratio: 4.3 RATIO
Triglycerides: 353 mg/dL — ABNORMAL HIGH (ref <150)
VLDL: 71 mg/dL — ABNORMAL HIGH (ref 0–40)

## 2018-07-25 LAB — TSH: TSH: 2.26 u[IU]/mL (ref 0.350–4.500)

## 2018-07-25 NOTE — Progress Notes (Signed)
[name] Female    DOB: 07-17-1935   83 y.o.   MRN: [id number] Visit Date: 07/25/2018  Today's Provider: [name], MD   Chief Complaint  Patient presents with  . Follow-up   Subjective:     HPI Patient comes in today for a 1 week follow up. She was seen in the office for pelvic pain and abdominal pain. She had her ultrasound done earlier today and is here for the results. She was started on Gabapentin 100mg  BID, and reports that this has helped with her pain.  She states the ultrasound technician was concerned because she had tenderness in the right lower quadrant which could be indicative of appendicitis.  She had also c/o swelling in her left leg on last visit. A lower extremity US was also ordered to rule out DVT. She is here for those results as well.  Of note is she knows she has a left knee that needs replacement and this pain is getting gradually worse.  This pain of the left knee is definitely weightbearing pain.  Patient mentions today that she has had increased pain in her right shoulder that is bothersome today. She has taken tylenol for this with minimal relief.   It is of note that the periurethral pain that the patient had from the last visit has almost resolved with gabapentin until she had the transvaginal ultrasound earlier.  BP Readings from Last 3 Encounters:  07/25/18 126/64  07/18/18 (!) 142/72  07/11/18 134/78   Wt Readings from Last 3 Encounters:  07/25/18 222 lb (100.7 kg)  07/18/18 222 lb (100.7 kg)  07/11/18 222 lb (100.7 kg)    Allergies  Allergen Reactions  . Penicillin V   . Penicillin V Potassium Hives    Has patient had a PCN reaction causing immediate rash, facial/tongue/throat swelling, SOB or lightheadedness with hypotension: no Has patient had a PCN reaction causing severe rash involving mucus membranes or skin necrosis: {no Has patient had a PCN reaction that required hospitalization no Has patient had a PCN  reaction occurring within the last 10 years: no If all of the above answers are "NO", then may proceed with Cephalosporin use.  . Sulfa Antibiotics Hives     Current Outpatient Medications:  .  aspirin 81 MG tablet, Take 81 mg by mouth daily. , Disp: , Rfl:  .  Cholecalciferol 1000 UNITS capsule, Take 1,000 Units by mouth at bedtime. , Disp: , Rfl:  .  gabapentin (NEURONTIN) 100 MG capsule, Take 1 capsule (100 mg total) by mouth 2 (two) times daily., Disp: 60 capsule, Rfl: 3 .  ketoconazole (NIZORAL) 2 % shampoo, SHAMPOO LEAVE ON FOR FIVE MINUTES THEN RINSE DAILY UNTIL CLEAR THEN WEEKLY FOR MAINTENANCE, Disp: , Rfl:  .  meloxicam (MOBIC) 15 MG tablet, Take 15 mg by mouth daily., Disp: , Rfl:  .  MULTIPLE VITAMIN PO, Take 1 tablet by mouth daily. , Disp: , Rfl:  .  nystatin cream (MYCOSTATIN), Apply 1 application topically 2 (two) times daily., Disp: 30 g, Rfl: 1 .  OMEGA-3 FATTY ACIDS PO, Take 1 capsule by mouth daily. , Disp: , Rfl:  .  rosuvastatin (CRESTOR) 20 MG tablet, Take 0.5 tablets (10 mg total) by mouth daily., Disp: 30 tablet, Rfl: 12  Review of Systems  Constitutional: Positive for activity change and fatigue.  HENT: Negative.   Eyes: Negative.   Respiratory: Negative for cough and shortness of breath.  Cardiovascular: Positive for leg swelling. Negative for chest pain and palpitations.  Endocrine: Negative.   Genitourinary: Positive for pelvic pain. Negative for decreased urine volume, difficulty urinating, dysuria, enuresis, flank pain, frequency, genital sores, hematuria, menstrual problem, urgency, vaginal bleeding, vaginal discharge and vaginal pain.  Musculoskeletal: Positive for arthralgias, myalgias and neck stiffness.  Allergic/Immunologic: Negative.   Neurological: Negative for dizziness, tremors, syncope, weakness, light-headedness and headaches.  Psychiatric/Behavioral: Negative for agitation, self-injury, sleep disturbance and suicidal ideas. The patient is  nervous/anxious.     Social History   Tobacco Use  . Smoking status: Never Smoker  . Smokeless tobacco: Never Used  Substance Use Topics  . Alcohol use: No      Objective:   BP 126/64 (BP Location: Right Arm, Patient Position: Sitting, Cuff Size: Large)   Pulse 78   Temp 98.5 F (36.9 C)   Resp 16   Wt 222 lb (100.7 kg)   SpO2 98%   BMI 38.11 kg/m  Vitals:   07/25/18 1500  BP: 126/64  Pulse: 78  Resp: 16  Temp: 98.5 F (36.9 C)  SpO2: 98%  Weight: 222 lb (100.7 kg)     Physical Exam Vitals signs reviewed.  Constitutional:      Appearance: She is obese.  HENT:     Head: Normocephalic and atraumatic.     Right Ear: External ear normal.     Left Ear: External ear normal.  Eyes:     General: No scleral icterus. Cardiovascular:     Rate and Rhythm: Normal rate and regular rhythm.     Pulses: Normal pulses.     Heart sounds: Normal heart sounds.  Pulmonary:     Effort: Pulmonary effort is normal.     Breath sounds: Normal breath sounds.  Abdominal:     Palpations: Abdomen is soft.     Tenderness: There is abdominal tenderness.     Comments: Minimal tenderness in the right lower quadrant.  No guarding or rebound.  Musculoskeletal:        General: Tenderness present.     Comments: Tender along lateral joint line of left knee.  Skin:    General: Skin is warm and dry.     Comments: Fair skin  Neurological:     Mental Status: She is alert and oriented to person, place, and time. Mental status is at baseline.  Psychiatric:        Mood and Affect: Mood normal.        Behavior: Behavior normal.        Thought Content: Thought content normal.        Judgment: Judgment normal.         Assessment & Plan    1. Pelvic pain Work-up negative per GYN and urology.  2. Abdominal pain, unspecified abdominal location Because of right lower quadrant tenderness proceed with CT of the abdomen and pelvis to rule out appendicitis  3. Acute pain of left knee For  about orthopedic surgery.  Was on injection helped her in the past. - Ambulatory referral to Orthopedic Surgery  4. Chronic right shoulder pain Her to orthopedics.  She does have anterior joint line tenderness from her trauma. - Ambulatory referral to Orthopedic Surgery  5. Hydronephrosis, left This was sent in/seen after her visit.  Due to hydronephrosis on the left and right lower quadrant pain we will proceed with CT scan.     Dhanya Bogle Cranford Mon, MD  [facility]

## 2018-07-25 NOTE — Patient Instructions (Signed)
Apply heating pad to right shoulder a few times daily.

## 2018-07-26 ENCOUNTER — Telehealth: Payer: Self-pay

## 2018-07-26 DIAGNOSIS — N1330 Unspecified hydronephrosis: Secondary | ICD-10-CM

## 2018-07-26 LAB — HEMOGLOBIN A1C
Hgb A1c MFr Bld: 5.8 % — ABNORMAL HIGH (ref 4.8–5.6)
Mean Plasma Glucose: 119.76 mg/dL

## 2018-07-26 NOTE — Telephone Encounter (Signed)
Patient was advised. CT ordered.

## 2018-07-26 NOTE — Telephone Encounter (Signed)
-----   Message from [name]., MD sent at 07/26/2018 12:21 PM EDT ----- Left hydronephrosis not seen on other studies. To be complete would order CT of abd/pelvis with contrast  for left hydronephrosis and RLQ tenderness/r/o appendicitis.

## 2018-07-29 ENCOUNTER — Telehealth: Payer: Self-pay | Admitting: Family Medicine

## 2018-07-29 DIAGNOSIS — M1712 Unilateral primary osteoarthritis, left knee: Secondary | ICD-10-CM | POA: Diagnosis not present

## 2018-07-29 DIAGNOSIS — S46001A Unspecified injury of muscle(s) and tendon(s) of the rotator cuff of right shoulder, initial encounter: Secondary | ICD-10-CM | POA: Diagnosis not present

## 2018-07-29 DIAGNOSIS — E669 Obesity, unspecified: Secondary | ICD-10-CM | POA: Diagnosis not present

## 2018-07-29 NOTE — Telephone Encounter (Signed)
Pt is waiting about the order for the scan pelvic  She ask for Rachelle to call.  CB#  [phone]  Thanks  Con Memos

## 2018-07-29 NOTE — Telephone Encounter (Signed)
Good Morning Elizabeth Trujillo! When you are able to schedule this patient for a CT can you let me know? Thank you! :-)

## 2018-07-29 NOTE — Telephone Encounter (Signed)
She is scheduled for 08/01/18.I have left message for her to return call so I can give her instructions

## 2018-08-01 ENCOUNTER — Other Ambulatory Visit: Payer: Self-pay

## 2018-08-01 ENCOUNTER — Ambulatory Visit: Payer: Self-pay | Admitting: Family Medicine

## 2018-08-01 ENCOUNTER — Ambulatory Visit
Admission: RE | Admit: 2018-08-01 | Discharge: 2018-08-01 | Disposition: A | Discharge status: Home / Self Care | Payer: Medicare Other | Source: Ambulatory Visit | Attending: Family Medicine | Admitting: Family Medicine

## 2018-08-01 DIAGNOSIS — K760 Fatty (change of) liver, not elsewhere classified: Secondary | ICD-10-CM | POA: Diagnosis not present

## 2018-08-01 DIAGNOSIS — N134 Hydroureter: Secondary | ICD-10-CM | POA: Diagnosis not present

## 2018-08-01 DIAGNOSIS — N1330 Unspecified hydronephrosis: Secondary | ICD-10-CM | POA: Diagnosis not present

## 2018-08-01 DIAGNOSIS — K5730 Diverticulosis of large intestine without perforation or abscess without bleeding: Secondary | ICD-10-CM | POA: Diagnosis not present

## 2018-08-01 HISTORY — DX: Systemic involvement of connective tissue, unspecified: M35.9

## 2018-08-01 MED ORDER — IOHEXOL 300 MG/ML  SOLN
100.0000 mL | Freq: Once | INTRAMUSCULAR | Status: AC | PRN
  Administered 2018-08-01: 100 mL via INTRAVENOUS

## 2018-08-08 ENCOUNTER — Telehealth: Payer: Self-pay

## 2018-08-08 NOTE — Telephone Encounter (Signed)
Patient is calling regarding her CT scan Results that Dr.Gilbert had order for her and has not receive the results. She also state that the pain is still there and that's is why she calling to see if we have the results. Do you think you can look into it please. CB#[phone]

## 2018-08-08 NOTE — Telephone Encounter (Signed)
Left hydronephrosis and hydroureter noted. No obvious stone. Does she have a urologist? If not we need to refer her.

## 2018-08-08 NOTE — Telephone Encounter (Signed)
Patient was notified of results. Patient states she has already seen urologist. Patient wants to get Dr. Alben Spittle opinion.on what the step is? Patient states she has been having pain again for over a week. Patient states gabapentin is not working.  Please advise? Patient is aware Dr. Rosanna Randy is out of the office till Monday.

## 2018-08-12 NOTE — Telephone Encounter (Signed)
Double gabapentin dose.Still would refer back to urology.

## 2018-08-12 NOTE — Telephone Encounter (Signed)
Patient was advised. Patient wants to confirm that she is to take gabapentin 100 mg 2 capsules bid. Please advise? Per Dr. Rosanna Randy patient was advised instructions were correct.

## 2018-08-13 ENCOUNTER — Other Ambulatory Visit: Payer: Self-pay

## 2018-08-13 DIAGNOSIS — N1330 Unspecified hydronephrosis: Secondary | ICD-10-CM

## 2018-08-21 ENCOUNTER — Telehealth: Payer: Self-pay

## 2018-08-21 MED ORDER — TRAMADOL HCL 50 MG PO TABS: 50.0000 mg | ORAL_TABLET | Freq: Two times a day (BID) | ORAL | 0 refills | Status: DC

## 2018-08-21 MED ORDER — TRAMADOL HCL 50 MG PO TABS: 50.0000 mg | ORAL_TABLET | Freq: Three times a day (TID) | ORAL | 0 refills | Status: DC | PRN

## 2018-08-21 NOTE — Telephone Encounter (Signed)
Patient states that she is still having some pain and would like a nurse to call her back.

## 2018-08-21 NOTE — Telephone Encounter (Signed)
Patient reports that she is taking the gabapentin along with Tylenol and it has not helped with her pain. Per Dr. Rosanna Randy, will prescribe Tramadol 50mg  BID to see if this helps.  Initially sent in 30 tablets, per pharmacy insurance will only cover 7 days of the medication. Advised patient of this.

## 2018-08-21 NOTE — Telephone Encounter (Signed)
Patient is requesting pain medication.

## 2018-08-27 ENCOUNTER — Ambulatory Visit: Payer: Self-pay | Admitting: Family Medicine

## 2018-08-28 ENCOUNTER — Encounter: Payer: Self-pay | Admitting: Urology

## 2018-08-28 ENCOUNTER — Other Ambulatory Visit: Payer: Self-pay | Admitting: Radiology

## 2018-08-28 ENCOUNTER — Telehealth: Payer: Self-pay | Admitting: Radiology

## 2018-08-28 ENCOUNTER — Other Ambulatory Visit: Payer: Self-pay

## 2018-08-28 ENCOUNTER — Ambulatory Visit (INDEPENDENT_AMBULATORY_CARE_PROVIDER_SITE_OTHER): Payer: Medicare Other | Admitting: Urology

## 2018-08-28 VITALS — BP 130/84 | HR 80 | Ht 64.0 in | Wt 215.0 lb

## 2018-08-28 DIAGNOSIS — R310 Gross hematuria: Secondary | ICD-10-CM

## 2018-08-28 DIAGNOSIS — N1330 Unspecified hydronephrosis: Secondary | ICD-10-CM

## 2018-08-28 MED ORDER — TRAMADOL HCL 50 MG PO TABS: 50.0000 mg | ORAL_TABLET | Freq: Two times a day (BID) | ORAL | 0 refills | Status: DC

## 2018-08-28 NOTE — Telephone Encounter (Signed)
Discussed the [facility] Information form below over the phone with patient.    [place], [address] [place], [place] [address] Telephone: [phone] Fax: [phone]   Thank you for choosing [facility] for your upcoming surgery!  We are always here to assist in your urological needs.  Please read the following information with specific details for your upcoming appointments related to your surgery. Please contact [facility] at [phone] Option 3 with any questions.  The Name of Your Surgery: Left ureteroscopy, possible biopsy, laser fulguration, ureteral stent placement Your Surgery Date: 09/16/2018 Your Surgeon: Hollice Espy  Please call Same Day Surgery at [phone] between the hours of 1pm-3pm one day prior to your surgery. They will inform you of the time to arrive at Same Day Surgery which is located on the second floor of the [facility].   Please refer to the attached letter regarding instructions for Pre-Admission Testing. You will receive a call from the [place] office regarding your appointment with them.  The Pre-Admission Testing office is located at [place], on the first floor of the [facility] in [place] (office is to the right as you enter through the [employer] of the [facility]). Please have all medications you are currently taking and your insurance card available.  A COVID-19 test will be required prior to surgery and once test is performed you will need to remain in quarantine until the day of surgery. Patient was advised to have nothing to eat or drink after midnight the night prior to surgery except that she may have only water until 2 hours before surgery with nothing to drink within 2 hours of surgery.  The patient states she currently takes aspirin 81mg  & was informed to  hold medication for 7 days prior to surgery beginning on 09/09/2018. Patient's questions were answered and she expressed understanding of these instructions.

## 2018-08-28 NOTE — Progress Notes (Signed)
08/28/2018 8:31 PM   Elizabeth Trujillo Dec 17, 1935 [id number]  Referring provider: [name]., MD [address] [name],  [place] [address]  Chief Complaint  Patient presents with  . Hematuria    HPI: 83 year old female with a personal history of recurrent painless gross hematuria who returns today with concerning imaging findings.  She initially underwent extensive gross hematuria evaluation in 2019 including CT urogram and cystoscopy.  This revealed no significant pathology.  In the interim, she continues to have intermittent episodes of hematuria without a clear etiology.  More recently, she returned to the office on 05/2018 for repeat cystoscopy again with no significant findings.  A renal ultrasound was ordered but scheduled in August for unclear reasons.  In the interim, her primary care physician ordered an abdominal ultrasound which showed new mild to moderate left hydronephrosis not previously appreciated.  This was followed up with a CT abdomen pelvis on 08/01/2018 which showed similar hydroureteronephrosis down to the level of the distal ureter without an obstructing stone.  Was described as "only slightly prominent" within the last several centimeters of the ureter.  She denies any significant bladder pain.  She does continue to have intermittent episodes of clitoral pain of unclear etiology.  She underwent extensive gynecological evaluation for this and no underlying etiology was identified.  Company today by her husband.   PMH: Past Medical History:  Diagnosis Date  . Arthritis   . Breast cancer ([facility]) 1986   left mastectomy  . Cancer ([facility]) 1986   left breast  . Collagen vascular disease ([facility])    RA   . Elevated triglycerides with high cholesterol   . GERD (gastroesophageal reflux disease)    history of reflux  . Hypercholesterolemia   . Hypertension     Surgical History: Past Surgical History:  Procedure Laterality Date  . ABDOMINAL  HYSTERECTOMY  [id number]   TAH/BSO  . BREAST BIOPSY Right 01/23/2013   [place] bx/clip-neg  . BREAST SURGERY Left 07/1984   Mastectomy  . callus removed from left toe    . CATARACT EXTRACTION W/ INTRAOCULAR LENS IMPLANT Left 2015  . EYE SURGERY Left 2013  . FOOT SURGERY Left 04/2013  . KNEE ARTHROPLASTY Right 11/03/2015   Procedure: COMPUTER ASSISTED TOTAL KNEE ARTHROPLASTY;  Surgeon: Dereck Leep, MD;  Location: [facility] ORS;  Service: Orthopedics;  Laterality: Right;  . MASTECTOMY Left 1986   positive    Home Medications:  Allergies as of 08/28/2018      Reactions   Penicillin V    Penicillin V Potassium Hives   Has patient had a PCN reaction causing immediate rash, facial/tongue/throat swelling, SOB or lightheadedness with hypotension: no Has patient had a PCN reaction causing severe rash involving mucus membranes or skin necrosis: {no Has patient had a PCN reaction that required hospitalization no Has patient had a PCN reaction occurring within the last 10 years: no If all of the above answers are "NO", then may proceed with Cephalosporin use.   Sulfa Antibiotics Hives      Medication List       Accurate as of August 28, 2018 11:59 PM. If you have any questions, ask your nurse or doctor.        aspirin 81 MG tablet Take 81 mg by mouth daily.   Cholecalciferol 25 MCG (1000 UT) capsule Take 1,000 Units by mouth at bedtime.   gabapentin 100 MG capsule Commonly known as: NEURONTIN Take 1 capsule (100 mg total) by  mouth 2 (two) times daily.   ketoconazole 2 % shampoo Commonly known as: NIZORAL SHAMPOO LEAVE ON FOR FIVE MINUTES THEN RINSE DAILY UNTIL CLEAR THEN WEEKLY FOR MAINTENANCE   meloxicam 15 MG tablet Commonly known as: MOBIC Take 15 mg by mouth daily.   MULTIPLE VITAMIN PO Take 1 tablet by mouth daily.   nystatin cream Commonly known as: MYCOSTATIN Apply 1 application topically 2 (two) times daily.   OMEGA-3 FATTY ACIDS PO Take 1 capsule by mouth daily.    rosuvastatin 20 MG tablet Commonly known as: CRESTOR Take 0.5 tablets (10 mg total) by mouth daily.   traMADol 50 MG tablet Commonly known as: ULTRAM Take 1 tablet (50 mg total) by mouth 2 (two) times daily.       Allergies:  Allergies  Allergen Reactions  . Penicillin V   . Penicillin V Potassium Hives    Has patient had a PCN reaction causing immediate rash, facial/tongue/throat swelling, SOB or lightheadedness with hypotension: no Has patient had a PCN reaction causing severe rash involving mucus membranes or skin necrosis: {no Has patient had a PCN reaction that required hospitalization no Has patient had a PCN reaction occurring within the last 10 years: no If all of the above answers are "NO", then may proceed with Cephalosporin use.  . Sulfa Antibiotics Hives    Family History: Family History  Problem Relation Age of Onset  . Cancer Mother        baldder and breast  . Glaucoma Mother   . Kidney disease Mother   . Arthritis Mother   . Stroke Mother   . Breast cancer Mother 29  . Heart disease Father   . Cancer Brother        lung cancer, smoker  . Breast cancer Brother   . Breast cancer Cousin        3 mat cousins    Social History:  reports that she has never smoked. She has never used smokeless tobacco. She reports that she does not drink alcohol or use drugs.  ROS: UROLOGY Frequent Urination?: No Hard to postpone urination?: No Burning/pain with urination?: No Get up at night to urinate?: No Leakage of urine?: No Urine stream starts and stops?: No Trouble starting stream?: No Do you have to strain to urinate?: No Blood in urine?: Yes Urinary tract infection?: No Sexually transmitted disease?: No Injury to kidneys or bladder?: No Painful intercourse?: No Weak stream?: No Currently pregnant?: No Vaginal bleeding?: No Last menstrual period?: n  Gastrointestinal Nausea?: No Vomiting?: No Indigestion/heartburn?: No Diarrhea?: No Constipation?:  No  Constitutional Fever: No Night sweats?: No Weight loss?: No Fatigue?: No  Skin Skin rash/lesions?: No Itching?: No  Eyes Blurred vision?: No Double vision?: No  Ears/Nose/Throat Sore throat?: No Sinus problems?: No  Hematologic/Lymphatic Swollen glands?: No Easy bruising?: No  Cardiovascular Leg swelling?: No Chest pain?: No  Respiratory Cough?: No Shortness of breath?: No  Endocrine Excessive thirst?: No  Musculoskeletal Back pain?: No Joint pain?: No  Neurological Headaches?: No Dizziness?: No  Psychologic Depression?: No Anxiety?: No  Physical Exam: BP 130/84   Pulse 80   Ht 5\' 4"  (1.626 m)   Wt 215 lb (97.5 kg)   BMI 36.90 kg/m   Constitutional:  Alert and oriented, No acute distress. HEENT: [place] AT, moist mucus membranes.  Trachea midline, no masses. Cardiovascular: No clubbing, cyanosis, or edema. Respiratory: Normal respiratory effort, no increased work of breathing. Skin: No rashes, bruises or suspicious lesions. Neurologic: Grossly  intact, no focal deficits, moving all 4 extremities. Psychiatric: Normal mood and affect.  Laboratory Data: Lab Results  Component Value Date   WBC 10.3 07/25/2018   HGB 13.9 07/25/2018   HCT 42.2 07/25/2018   MCV 93.6 07/25/2018   PLT 255 07/25/2018    Lab Results  Component Value Date   CREATININE 0.65 07/25/2018   Lab Results  Component Value Date   HGBA1C 5.8 (H) 07/25/2018    Urinalysis Results for orders placed or performed in visit on 08/28/18  Urine culture   Specimen: Urine   UR  Result Value Ref Range   Urine Culture, Routine Final report    Organism ID, Bacteria Comment   Microscopic Examination   URINE  Result Value Ref Range   WBC, UA 11-30 (A) 0 - 5 /hpf   RBC >30 (A) 0 - 2 /hpf   Epithelial Cells (non renal) 0-10 0 - 10 /hpf   Bacteria, UA Moderate (A) None seen/Few  Urinalysis, Complete  Result Value Ref Range   Specific Gravity, UA 1.020 1.005 - 1.030   pH, UA 6.5  5.0 - 7.5   Color, UA Yellow Yellow   Appearance Ur Cloudy (A) Clear   Leukocytes,UA 2+ (A) Negative   Protein,UA 1+ (A) Negative/Trace   Glucose, UA Negative Negative   Ketones, UA Negative Negative   RBC, UA 3+ (A) Negative   Bilirubin, UA Negative Negative   Urobilinogen, Ur 0.2 0.2 - 1.0 mg/dL   Nitrite, UA Negative Negative   Microscopic Examination See below:      Pertinent Imaging: CT abdomen pelvis with contrast on 08/01/2018 was personally reviewed.  Agree with radiologic interpretation,  Abdominal ultrasound was also reviewed.  These are also directly compared to CT hematuria work-up from 04/2017 which currently indicate this is a new finding.  Assessment & Plan:    1. Gross hematuria Possibly secondary to #2 See below discussion - Urinalysis, Complete - Urine culture  2. Hydronephrosis of left kidney New finding of left hydroureteronephrosis down to the distal ureter, concerning for possible underlying malignancy amongst others in differential diagnosis.  This was discussed with the patient and her husband today extensively.  Actually recommend proceeding to the operating room for cystoscopy, bilateral retrograde pyelogram, diagnostic left ureteroscopy with possible biopsy, possible ablation and ureteral stent placement.  Both she and her husband are agreeable this plan.  Risk of surgery were discussed in detail including risk of bleeding, infection,, stent pain, need for further procedures amongst others.  All questions were answered. - Urine culture  Hollice Espy, MD  [facility] [address] [place], [place] [address] [phone]  I spent 25 min with this patient of which greater than 50% was spent in counseling and coordination of care with the patient.

## 2018-08-28 NOTE — Patient Instructions (Signed)
Ureteroscopy °Ureteroscopy is a procedure to check for and treat problems inside part of the urinary tract. In this procedure, a thin, tube-shaped instrument with a light at the end (ureteroscope) is used to look at the inside of the kidneys and the ureters, which are the tubes that carry urine from the kidneys to the bladder. The ureteroscope is inserted into one or both of the ureters. °You may need this procedure if you have frequent urinary tract infections (UTIs), blood in your urine, or a stone in one of your ureters. A ureteroscopy can be done to find the cause of urine blockage in a ureter and to evaluate other abnormalities inside the ureters or kidneys. If stones are found, they can be removed during the procedure. Polyps, abnormal tissue, and some types of tumors can also be removed or treated. The ureteroscope may also have a tool to remove tissue to be checked for disease under a microscope (biopsy). °Tell a health care provider about: °· Any allergies you have. °· All medicines you are taking, including vitamins, herbs, eye drops, creams, and over-the-counter medicines. °· Any problems you or family members have had with anesthetic medicines. °· Any blood disorders you have. °· Any surgeries you have had. °· Any medical conditions you have. °· Whether you are pregnant or may be pregnant. °What are the risks? °Generally, this is a safe procedure. However, problems may occur, including: °· Bleeding. °· Infection. °· Allergic reactions to medicines. °· Scarring that narrows the ureter (stricture). °· Creating a hole in the ureter (perforation). °What happens before the procedure? °Staying hydrated °Follow instructions from your health care provider about hydration, which may include: °· Up to 2 hours before the procedure - you may continue to drink clear liquids, such as water, clear fruit juice, black coffee, and plain tea. °Eating and drinking restrictions °Follow instructions from your health care  provider about eating and drinking, which may include: °· 8 hours before the procedure - stop eating heavy meals or foods such as meat, fried foods, or fatty foods. °· 6 hours before the procedure - stop eating light meals or foods, such as toast or cereal. °· 6 hours before the procedure - stop drinking milk or drinks that contain milk. °· 2 hours before the procedure - stop drinking clear liquids. °Medicines °· Ask your health care provider about: °? Changing or stopping your regular medicines. This is especially important if you are taking diabetes medicines or blood thinners. °? Taking medicines such as aspirin and ibuprofen. These medicines can thin your blood. Do not take these medicines before your procedure if your health care provider instructs you not to. °· You may be given antibiotic medicine to help prevent infection. °General instructions °· You may have a urine sample taken to check for infection. °· Plan to have someone take you home from the hospital or clinic. °What happens during the procedure? ° °· To reduce your risk of infection: °? Your health care team will wash or sanitize their hands. °? Your skin will be washed with soap. °· An IV tube will be inserted into one of your veins. °· You will be given one of the following: °? A medicine to help you relax (sedative). °? A medicine to make you fall asleep (general anesthetic). °? A medicine that is injected into your spine to numb the area below and slightly above the injection site (spinal anesthetic). °· To lower your risk of infection, you may be given an antibiotic medicine   by an injection or through the IV tube.  The opening from which you urinate (urethra) will be cleaned with a germ-killing solution.  The ureteroscope will be passed through your urethra into your bladder.  A salt-water solution will flow through the ureteroscope to fill your bladder. This will help the health care provider see the openings of your ureters more  clearly.  Then, the ureteroscope will be passed into your ureter. ? If a growth is found, a piece of it may be removed so it can be examined under a microscope (biopsy). ? If a stone is found, it may be removed through the ureteroscope, or the stone may be broken up using a laser, shock waves, or electrical energy. ? In some cases, if the ureter is too small, a tube may be inserted that keeps the ureter open (ureteral stent). The stent may be left in place for 1 or 2 weeks to keep the ureter open, and then the ureteroscopy procedure will be performed.  The scope will be removed, and your bladder will be emptied. The procedure may vary among health care providers and hospitals. What happens after the procedure?  Your blood pressure, heart rate, breathing rate, and blood oxygen level will be monitored until the medicines you were given have worn off.  You may be asked to urinate.  Donot drive for 24 hours if you were given a sedative. This information is not intended to replace advice given to you by your health care provider. Make sure you discuss any questions you have with your health care provider. Document Released: 03/04/2013 Document Revised: 12/14/2015 Document Reviewed: 12/10/2015 Elsevier Interactive Patient Education  2019 [employer].   Ureteral Stent Implantation  Ureteral stent implantation is a procedure to insert (implant) a flexible, soft, plastic tube (stent) into a tube (ureter) that drains urine from the kidneys. The stent supports the ureter while it heals and helps to drain urine from the kidneys. You may have a ureteral stent implanted after having a procedure to remove a blockage from the ureter (ureterolysis or pyeloplasty).You may also have a stent implanted to open the flow of urine when you have a blockage caused by a kidney stone, tumor, blood clot, or infection. You have two ureters, one on each side of the body. The ureters connect the kidneys to the organ  that holds urine until it passes out of the body (bladder). The stent is placed so that one end is in the kidney, and one end is in the bladder. The stent is usually taken out after your ureter has healed. Depending on your condition, you may have a stent for just a few weeks, or you may have a long-term stent that will need to be replaced every few months. Tell a health care provider about:  Any allergies you have.  All medicines you are taking, including vitamins, herbs, eye drops, creams, and over-the-counter medicines.  Any problems you or family members have had with anesthetic medicines.  Any blood disorders you have.  Any surgeries you have had.  Any medical conditions you have.  Whether you are pregnant or may be pregnant. What are the risks? Generally, this is a safe procedure. However, problems may occur, including:  Infection.  Bleeding.  Allergic reactions to medicines.  Damage to other structures or organs. Tearing (perforation) of the ureter is possible.  Movement of the stent away from where it is placed during surgery (migration). What happens before the procedure?  Ask your health care  provider about: ? Changing or stopping your regular medicines. This is especially important if you are taking diabetes medicines or blood thinners. ? Taking medicines such as aspirin and ibuprofen. These medicines can thin your blood. Do not take these medicines before your procedure if your health care provider instructs you not to.  Follow instructions from your health care provider about eating or drinking restrictions.  Do not drink alcohol and do not use any tobacco products before your procedure, as told by your health care provider.  You may be given antibiotic medicine to help prevent infection.  Plan to have someone take you home after the procedure.  If you go home right after the procedure, plan to have someone with you for 24 hours. What happens during the  procedure?  An IV tube will be inserted into one of your veins.  You will be given a medicine to make you fall asleep (general anesthetic). You may also be given a medicine to help you relax (sedative).  A thin, tube-shaped instrument with a light and tiny camera at the end (cystoscope) will be inserted into your urethra. The urethra is the tube that drains urine from the bladder out of the body. In men, the urethra opens at the end of the penis. In women, the urethra opens in front of the vaginal opening.  The cystoscope will be passed into your bladder.  A thin wire (guide wire) will be passed through your bladder and into your ureter. This is used to guide the stent into your ureter.  The stent will be inserted into your ureter.  The guide wire and the cystoscope will be removed.  A flexible tube (catheter) will be inserted through your urethra so that one end is in your bladder. This helps to drain urine from your bladder. The procedure may vary among hospitals and health care providers. What happens after the procedure?  Your blood pressure, heart rate, breathing rate, and blood oxygen level will be monitored often until the medicines you were given have worn off.  You may continue to receive medicine and fluids through an IV tube.  You may have some soreness or pain in your abdomen and urethra. Medicines will be available to help you.  You will be encouraged to get up and walk around as soon as you can.  You will have a catheter draining your urine.  You will have some blood in your urine.  Do not drive for 24 hours if you received a sedative. This information is not intended to replace advice given to you by your health care provider. Make sure you discuss any questions you have with your health care provider. Document Released: 02/25/2000 Document Revised: 08/05/2015 Document Reviewed: 09/11/2014 Elsevier Interactive Patient Education  2019 [employer].

## 2018-08-29 LAB — URINALYSIS, COMPLETE
Bilirubin, UA: NEGATIVE
Glucose, UA: NEGATIVE
Ketones, UA: NEGATIVE
Nitrite, UA: NEGATIVE
Specific Gravity, UA: 1.02 (ref 1.005–1.030)
Urobilinogen, Ur: 0.2 mg/dL (ref 0.2–1.0)
pH, UA: 6.5 (ref 5.0–7.5)

## 2018-08-29 LAB — MICROSCOPIC EXAMINATION: RBC, Urine: >30 /hpf — AB (ref 0–2)

## 2018-08-30 LAB — URINE CULTURE

## 2018-09-08 ENCOUNTER — Observation Stay
Admission: EM | Admit: 2018-09-08 | Discharge: 2018-09-09 | Disposition: A | Discharge status: Home / Self Care | Payer: Medicare Other | Attending: Internal Medicine | Admitting: Internal Medicine

## 2018-09-08 ENCOUNTER — Observation Stay: Payer: Medicare Other

## 2018-09-08 ENCOUNTER — Emergency Department: Payer: Medicare Other

## 2018-09-08 ENCOUNTER — Other Ambulatory Visit: Payer: Self-pay

## 2018-09-08 ENCOUNTER — Encounter: Payer: Self-pay | Admitting: Emergency Medicine

## 2018-09-08 DIAGNOSIS — R531 Weakness: Secondary | ICD-10-CM | POA: Diagnosis not present

## 2018-09-08 DIAGNOSIS — M6281 Muscle weakness (generalized): Secondary | ICD-10-CM | POA: Diagnosis not present

## 2018-09-08 DIAGNOSIS — R2681 Unsteadiness on feet: Secondary | ICD-10-CM | POA: Diagnosis not present

## 2018-09-08 DIAGNOSIS — Z7982 Long term (current) use of aspirin: Secondary | ICD-10-CM | POA: Diagnosis not present

## 2018-09-08 DIAGNOSIS — M25519 Pain in unspecified shoulder: Secondary | ICD-10-CM | POA: Diagnosis not present

## 2018-09-08 DIAGNOSIS — R4702 Dysphasia: Secondary | ICD-10-CM | POA: Diagnosis not present

## 2018-09-08 DIAGNOSIS — Z880 Allergy status to penicillin: Secondary | ICD-10-CM | POA: Insufficient documentation

## 2018-09-08 DIAGNOSIS — Z9012 Acquired absence of left breast and nipple: Secondary | ICD-10-CM | POA: Diagnosis not present

## 2018-09-08 DIAGNOSIS — M75101 Unspecified rotator cuff tear or rupture of right shoulder, not specified as traumatic: Secondary | ICD-10-CM | POA: Insufficient documentation

## 2018-09-08 DIAGNOSIS — G629 Polyneuropathy, unspecified: Secondary | ICD-10-CM | POA: Diagnosis not present

## 2018-09-08 DIAGNOSIS — E7800 Pure hypercholesterolemia, unspecified: Secondary | ICD-10-CM

## 2018-09-08 DIAGNOSIS — Z66 Do not resuscitate: Secondary | ICD-10-CM | POA: Insufficient documentation

## 2018-09-08 DIAGNOSIS — Z853 Personal history of malignant neoplasm of breast: Secondary | ICD-10-CM | POA: Diagnosis not present

## 2018-09-08 DIAGNOSIS — M1990 Unspecified osteoarthritis, unspecified site: Secondary | ICD-10-CM | POA: Diagnosis not present

## 2018-09-08 DIAGNOSIS — E785 Hyperlipidemia, unspecified: Secondary | ICD-10-CM | POA: Insufficient documentation

## 2018-09-08 DIAGNOSIS — R319 Hematuria, unspecified: Secondary | ICD-10-CM | POA: Insufficient documentation

## 2018-09-08 DIAGNOSIS — R4781 Slurred speech: Secondary | ICD-10-CM | POA: Diagnosis not present

## 2018-09-08 DIAGNOSIS — Z03818 Encounter for observation for suspected exposure to other biological agents ruled out: Secondary | ICD-10-CM | POA: Diagnosis not present

## 2018-09-08 DIAGNOSIS — R29898 Other symptoms and signs involving the musculoskeletal system: Secondary | ICD-10-CM | POA: Insufficient documentation

## 2018-09-08 DIAGNOSIS — R4701 Aphasia: Secondary | ICD-10-CM | POA: Diagnosis not present

## 2018-09-08 DIAGNOSIS — R262 Difficulty in walking, not elsewhere classified: Secondary | ICD-10-CM | POA: Insufficient documentation

## 2018-09-08 DIAGNOSIS — Z7902 Long term (current) use of antithrombotics/antiplatelets: Secondary | ICD-10-CM | POA: Diagnosis not present

## 2018-09-08 DIAGNOSIS — Z882 Allergy status to sulfonamides status: Secondary | ICD-10-CM | POA: Insufficient documentation

## 2018-09-08 DIAGNOSIS — Z1159 Encounter for screening for other viral diseases: Secondary | ICD-10-CM | POA: Insufficient documentation

## 2018-09-08 DIAGNOSIS — G459 Transient cerebral ischemic attack, unspecified: Principal | ICD-10-CM

## 2018-09-08 DIAGNOSIS — Z791 Long term (current) use of non-steroidal anti-inflammatories (NSAID): Secondary | ICD-10-CM | POA: Diagnosis not present

## 2018-09-08 DIAGNOSIS — Z79899 Other long term (current) drug therapy: Secondary | ICD-10-CM | POA: Diagnosis not present

## 2018-09-08 DIAGNOSIS — I10 Essential (primary) hypertension: Secondary | ICD-10-CM | POA: Diagnosis not present

## 2018-09-08 DIAGNOSIS — K219 Gastro-esophageal reflux disease without esophagitis: Secondary | ICD-10-CM | POA: Diagnosis not present

## 2018-09-08 DIAGNOSIS — I340 Nonrheumatic mitral (valve) insufficiency: Secondary | ICD-10-CM | POA: Insufficient documentation

## 2018-09-08 DIAGNOSIS — R29810 Facial weakness: Secondary | ICD-10-CM | POA: Diagnosis not present

## 2018-09-08 LAB — CBC WITH DIFFERENTIAL/PLATELET
Abs Immature Granulocytes: 0.04 10*3/uL (ref 0.00–0.07)
Basophils Absolute: 0.1 10*3/uL (ref 0.0–0.1)
Basophils Relative: 1 %
Eosinophils Absolute: 0.1 10*3/uL (ref 0.0–0.5)
Eosinophils Relative: 1 %
HCT: 38.9 % (ref 36.0–46.0)
Hemoglobin: 12.8 g/dL (ref 12.0–15.0)
Immature Granulocytes: 0 %
Lymphocytes Relative: 20 %
Lymphs Abs: 1.8 10*3/uL (ref 0.7–4.0)
MCH: 30.5 pg (ref 26.0–34.0)
MCHC: 32.9 g/dL (ref 30.0–36.0)
MCV: 92.6 fL (ref 80.0–100.0)
Monocytes Absolute: 0.9 10*3/uL (ref 0.1–1.0)
Monocytes Relative: 10 %
Neutro Abs: 6.2 10*3/uL (ref 1.7–7.7)
Neutrophils Relative %: 68 %
Platelets: 284 10*3/uL (ref 150–400)
RBC: 4.2 MIL/uL (ref 3.87–5.11)
RDW: 13.7 % (ref 11.5–15.5)
WBC: 9.1 10*3/uL (ref 4.0–10.5)
nRBC: 0 % (ref 0.0–0.2)

## 2018-09-08 LAB — URINALYSIS, COMPLETE (UACMP) WITH MICROSCOPIC
Bacteria, UA: NONE SEEN
Bilirubin Urine: NEGATIVE
Glucose, UA: NEGATIVE mg/dL
Ketones, ur: NEGATIVE mg/dL
Nitrite: NEGATIVE
Protein, ur: NEGATIVE mg/dL
RBC / HPF: >50 RBC/hpf — ABNORMAL HIGH (ref 0–5)
Specific Gravity, Urine: 1.009 (ref 1.005–1.030)
pH: 7 (ref 5.0–8.0)

## 2018-09-08 LAB — COMPREHENSIVE METABOLIC PANEL
ALT: 16 U/L (ref 0–44)
AST: 18 U/L (ref 15–41)
Albumin: 3.3 g/dL — ABNORMAL LOW (ref 3.5–5.0)
Alkaline Phosphatase: 68 U/L (ref 38–126)
Anion gap: 9 (ref 5–15)
BUN: 27 mg/dL — ABNORMAL HIGH (ref 8–23)
CO2: 27 mmol/L (ref 22–32)
Calcium: 8.9 mg/dL (ref 8.9–10.3)
Chloride: 105 mmol/L (ref 98–111)
Creatinine, Ser: 0.68 mg/dL (ref 0.44–1.00)
GFR calc Af Amer: >60 mL/min (ref >60)
GFR calc non Af Amer: >60 mL/min (ref >60)
Glucose, Bld: 107 mg/dL — ABNORMAL HIGH (ref 70–99)
Potassium: 3.9 mmol/L (ref 3.5–5.1)
Sodium: 141 mmol/L (ref 135–145)
Total Bilirubin: 0.3 mg/dL (ref 0.3–1.2)
Total Protein: 6 g/dL — ABNORMAL LOW (ref 6.5–8.1)

## 2018-09-08 LAB — SARS CORONAVIRUS 2 BY RT PCR (HOSPITAL ORDER, PERFORMED IN ~~LOC~~ HOSPITAL LAB): SARS Coronavirus 2: NEGATIVE

## 2018-09-08 LAB — LACTIC ACID, PLASMA: Lactic Acid, Venous: 0.9 mmol/L (ref 0.5–1.9)

## 2018-09-08 MED ORDER — ASPIRIN 325 MG PO TABS
325.0000 mg | ORAL_TABLET | Freq: Every day | ORAL | Status: DC
  Administered 2018-09-08: 325 mg via ORAL
  Filled 2018-09-08 (×2): qty 1

## 2018-09-08 MED ORDER — ACETAMINOPHEN 325 MG PO TABS: 650.0000 mg | ORAL_TABLET | ORAL | Status: DC | PRN

## 2018-09-08 MED ORDER — STROKE: EARLY STAGES OF RECOVERY BOOK
Freq: Once | Status: AC
  Administered 2018-09-09: 02:00:00

## 2018-09-08 MED ORDER — ACETAMINOPHEN 160 MG/5ML PO SOLN
650.0000 mg | ORAL | Status: DC | PRN
  Filled 2018-09-08: qty 20.3

## 2018-09-08 MED ORDER — ENOXAPARIN SODIUM 40 MG/0.4ML ~~LOC~~ SOLN
40.0000 mg | SUBCUTANEOUS | Status: DC
  Administered 2018-09-09: 02:00:00 40 mg via SUBCUTANEOUS
  Filled 2018-09-08: qty 0.4

## 2018-09-08 MED ORDER — ROSUVASTATIN CALCIUM 10 MG PO TABS
10.0000 mg | ORAL_TABLET | Freq: Every day | ORAL | Status: DC
  Administered 2018-09-09: 10 mg via ORAL
  Filled 2018-09-08: qty 1

## 2018-09-08 MED ORDER — ACETAMINOPHEN 650 MG RE SUPP: 650.0000 mg | RECTAL | Status: DC | PRN

## 2018-09-08 MED ORDER — ASPIRIN 81 MG PO TABS: 81.0000 mg | ORAL_TABLET | Freq: Every day | ORAL | Status: DC

## 2018-09-08 NOTE — ED Notes (Signed)
Patient's family notified of hospital admission by patient, transported to 1C in wheelchair, alert and oriented x4, NAD

## 2018-09-08 NOTE — ED Notes (Signed)
ED TO INPATIENT HANDOFF REPORT  ED Nurse Name and Phone #: [phone]   S Name/Age/Gender Elizabeth Trujillo 83 y.o. female Room/Bed: ED09A/ED09A  Code Status   Code Status: Prior  Home/SNF/Other Home Patient oriented to: self, place, time and situation Is this baseline? Yes   Triage Complete: Triage complete  Chief Complaint Speech aphasia  Triage Note Patient arrives from home with history of TIA, began having symptoms like aphasia and confusion around 5pm, cleared up but took longer than her TIAs usually last. Arrives AxO x4, NAD   Allergies Allergies  Allergen Reactions  . Penicillin V   . Penicillin V Potassium Hives    Has patient had a PCN reaction causing immediate rash, facial/tongue/throat swelling, SOB or lightheadedness with hypotension: no Has patient had a PCN reaction causing severe rash involving mucus membranes or skin necrosis: {no Has patient had a PCN reaction that required hospitalization no Has patient had a PCN reaction occurring within the last 10 years: no If all of the above answers are "NO", then may proceed with Cephalosporin use.  . Sulfa Antibiotics Hives    Level of Care/Admitting Diagnosis ED Disposition    ED Disposition Condition [facility] Area: [place] [[id number]]  Level of Care: Med-Surg [16]  Covid Evaluation: Screening Protocol (No Symptoms)  Diagnosis: Aphasia Marvelous.[place].3.ICD-9-CM]  Admitting Physician: Lance Coon [[id number]]  Attending Physician: Lance Coon [[id number]]  PT Class (Do Not Modify): Observation [104]  PT Acc Code (Do Not Modify): Observation [10022]       B Medical/Surgery History Past Medical History:  Diagnosis Date  . Arthritis   . Breast cancer ([place]) 1986   left mastectomy  . Cancer ([place]) 1986   left breast  . Collagen vascular disease ([place])    RA   . Elevated triglycerides with high cholesterol   . GERD (gastroesophageal reflux disease)    history of reflux  .  Hypercholesterolemia   . Hypertension    Past Surgical History:  Procedure Laterality Date  . ABDOMINAL HYSTERECTOMY  812751   TAH/BSO  . BREAST BIOPSY Right 01/23/2013   [place] bx/clip-neg  . BREAST SURGERY Left 07/1984   Mastectomy  . callus removed from left toe    . CATARACT EXTRACTION W/ INTRAOCULAR LENS IMPLANT Left 2015  . EYE SURGERY Left 2013  . FOOT SURGERY Left 04/2013  . KNEE ARTHROPLASTY Right 11/03/2015   Procedure: COMPUTER ASSISTED TOTAL KNEE ARTHROPLASTY;  Surgeon: Dereck Leep, MD;  Location: [facility] ORS;  Service: Orthopedics;  Laterality: Right;  . MASTECTOMY Left 1986   positive     A IV Location/Drains/Wounds Patient Lines/Drains/Airways Status   Active Line/Drains/Airways    Name:   Placement date:   Placement time:   Site:   Days:   Peripheral IV 11/03/15 Right Hand   11/03/15    1104    Hand   1040   Peripheral IV 08/01/18 Right Antecubital   08/01/18    1112    Antecubital   38   Peripheral IV 09/08/18 Right Antecubital   09/08/18    1910    Antecubital   less than 1   Closed System Drain Right Knee Autotranfusion system (Autovac)   11/03/15    1530    Knee   1040   Closed System Drain Right Knee [employer] (Hemovac)   -    -    Knee      Incision (Closed) 11/03/15 Knee Right  11/03/15    1245     1040          Intake/Output Last 24 hours No intake or output data in the 24 hours ending 09/08/18 2211  Labs/Imaging Results for orders placed or performed during the hospital encounter of 09/08/18 (from the past 48 hour(s))  CBC with Differential     Status: None   Collection Time: 09/08/18  7:22 PM  Result Value Ref Range   WBC 9.1 4.0 - 10.5 K/uL   RBC 4.20 3.87 - 5.11 MIL/uL   Hemoglobin 12.8 12.0 - 15.0 g/dL   HCT 38.9 36.0 - 46.0 %   MCV 92.6 80.0 - 100.0 fL   MCH 30.5 26.0 - 34.0 pg   MCHC 32.9 30.0 - 36.0 g/dL   RDW 13.7 11.5 - 15.5 %   Platelets 284 150 - 400 K/uL   nRBC 0.0 0.0 - 0.2 %   Neutrophils Relative % 68 %   Neutro Abs 6.2 1.7 -  7.7 K/uL   Lymphocytes Relative 20 %   Lymphs Abs 1.8 0.7 - 4.0 K/uL   Monocytes Relative 10 %   Monocytes Absolute 0.9 0.1 - 1.0 K/uL   Eosinophils Relative 1 %   Eosinophils Absolute 0.1 0.0 - 0.5 K/uL   Basophils Relative 1 %   Basophils Absolute 0.1 0.0 - 0.1 K/uL   Immature Granulocytes 0 %   Abs Immature Granulocytes 0.04 0.00 - 0.07 K/uL    Comment: Performed at [facility], [address] [place], [place] [address]  Comprehensive metabolic panel     Status: Abnormal   Collection Time: 09/08/18  7:22 PM  Result Value Ref Range   Sodium 141 135 - 145 mmol/L   Potassium 3.9 3.5 - 5.1 mmol/L   Chloride 105 98 - 111 mmol/L   CO2 27 22 - 32 mmol/L   Glucose, Bld 107 (H) 70 - 99 mg/dL   BUN 27 (H) 8 - 23 mg/dL   Creatinine, Ser 0.68 0.44 - 1.00 mg/dL   Calcium 8.9 8.9 - 10.3 mg/dL   Total Protein 6.0 (L) 6.5 - 8.1 g/dL   Albumin 3.3 (L) 3.5 - 5.0 g/dL   AST 18 15 - 41 U/L   ALT 16 0 - 44 U/L   Alkaline Phosphatase 68 38 - 126 U/L   Total Bilirubin 0.3 0.3 - 1.2 mg/dL   GFR calc non Af Amer >60 >60 mL/min   GFR calc Af Amer >60 >60 mL/min   Anion gap 9 5 - 15    Comment: Performed at [facility], [name] [place], [place] [address]  Lactic acid, plasma     Status: None   Collection Time: 09/08/18  7:22 PM  Result Value Ref Range   Lactic Acid, Venous 0.9 0.5 - 1.9 mmol/L    Comment: Performed at [facility], [address] [place], [place] [address]  Urinalysis, Complete w Microscopic     Status: Abnormal   Collection Time: 09/08/18  8:23 PM  Result Value Ref Range   Color, Urine STRAW (A) YELLOW   APPearance CLEAR (A) CLEAR   Specific Gravity, Urine 1.009 1.005 - 1.030   pH 7.0 5.0 - 8.0   Glucose, UA NEGATIVE NEGATIVE mg/dL   Hgb urine dipstick LARGE (A) NEGATIVE   Bilirubin Urine NEGATIVE NEGATIVE   Ketones, ur NEGATIVE NEGATIVE mg/dL   Protein, ur NEGATIVE NEGATIVE mg/dL   Nitrite NEGATIVE NEGATIVE   Leukocytes,Ua TRACE  (  A) NEGATIVE   RBC / HPF >50 (H) 0 - 5 RBC/hpf   WBC, UA 6-10 0 - 5 WBC/hpf   Bacteria, UA NONE SEEN NONE SEEN   Squamous Epithelial / LPF 0-5 0 - 5    Comment: Performed at [facility], [address] [place], [place] [address]   Ct Head Wo Contrast  Result Date: 09/08/2018 CLINICAL DATA:  Aphasia, confusion, onset of symptoms at 1700 hours but cleared, though taking longer than her previous TIA symptoms EXAM: CT HEAD WITHOUT CONTRAST TECHNIQUE: Contiguous axial images were obtained from the base of the skull through the vertex without intravenous contrast. Sagittal and coronal MPR images reconstructed from axial data set. COMPARISON:  CT head 05/07/2014, MR brain 02/06/2018 FINDINGS: Brain: Mild atrophy. Normal ventricular morphology. No midline shift or mass effect. Mild small vessel chronic ischemic changes of deep cerebral white matter. No intracranial hemorrhage, mass lesion or evidence acute infarction. Vascular: Unremarkable.  No hyperdense vessels. Skull: Intact Sinuses/Orbits: Clear Other: N/A IMPRESSION: Atrophy with mild small vessel chronic ischemic changes of deep cerebral white matter. No acute intracranial abnormalities. Electronically Signed   By: Lavonia Dana M.D.   On: 09/08/2018 20:14    Pending Labs Unresulted Labs (From admission, onward)    Start     Ordered   09/08/18 2103  SARS Coronavirus 2 (CEPHEID - Performed in [facility] hospital lab), [facility] Order  Once,   STAT    Question:  Rule Out  Answer:  Yes   09/08/18 2102   09/08/18 1949  Lactic acid, plasma  Now then every 2 hours,   STAT     09/08/18 1949   Signed and Held  Hemoglobin A1c  Tomorrow morning,   R     Signed and Held   Signed and Held  Lipid panel  Tomorrow morning,   R    Comments: Fasting    Signed and Held   Signed and Held  CBC  (enoxaparin (LOVENOX)    CrCl >/= 30 ml/min)  Once,   R    Comments: Baseline for enoxaparin therapy IF NOT ALREADY DRAWN.  Notify MD if PLT < 100 K.    Signed  and Held   Signed and Held  Creatinine, serum  (enoxaparin (LOVENOX)    CrCl >/= 30 ml/min)  Once,   R    Comments: Baseline for enoxaparin therapy IF NOT ALREADY DRAWN.    Signed and Held   Signed and Held  Creatinine, serum  (enoxaparin (LOVENOX)    CrCl >/= 30 ml/min)  Weekly,   R    Comments: while on enoxaparin therapy    Signed and Held          Vitals/Pain Today's Vitals   09/08/18 2130 09/08/18 2151 09/08/18 2152 09/08/18 2153  BP: (!) 144/71     Pulse: 62   65  Resp: 15 18 13 11   Temp:      TempSrc:      SpO2: 94%   96%  Weight:      Height:      PainSc:        Isolation Precautions No active isolations  Medications Medications  aspirin tablet 325 mg (325 mg Oral Given 09/08/18 2116)    Mobility walks with person assist Low fall risk   Focused Assessments Neuro Assessment Handoff:  Swallow screen pass? not done Cardiac Rhythm: Normal sinus rhythm NIH Stroke Scale ( + Modified Stroke Scale Criteria)  Interval: Initial Level of  Consciousness (1a.)   : Alert, keenly responsive LOC Questions (1b. )   +: Answers both questions correctly LOC Commands (1c. )   + : Performs both tasks correctly Best Gaze (2. )  +: Normal Visual (3. )  +: No visual loss Facial Palsy (4. )    : Normal symmetrical movements Motor Arm, Left (5a. )   +: No drift Motor Arm, Right (5b. )   +: No drift Motor Leg, Left (6a. )   +: No drift Motor Leg, Right (6b. )   +: No drift Limb Ataxia (7. ): Absent Sensory (8. )   +: Normal, no sensory loss Best Language (9. )   +: No aphasia Dysarthria (10. ): Normal Extinction/Inattention (11.)   +: No Abnormality Modified SS Total  +: 0 Complete NIHSS TOTAL: 0 Last date known well: 09/08/18 Last time known well: 1700 Neuro Assessment:   Neuro Checks:   Initial (09/08/18 1902)  Last Documented NIHSS Modified Score: 0 (09/08/18 1902) Has TPA been given? No If patient is a Neuro Trauma and patient is going to OR before floor call  report to [place] nurse: [phone] or [phone]     R Recommendations: See Admitting Provider Note  Report given to:   Additional Notes:

## 2018-09-08 NOTE — ED Provider Notes (Signed)
[facility] Emergency Department Provider Note  ____________________________________________  Time seen: Approximately 8:53 PM  I have reviewed the triage vital signs and the nursing notes.   HISTORY  Chief Complaint Altered Mental Status and Aphasia    HPI Elizabeth Trujillo is a 83 y.o. female with a history of TIA, presents to the emergency department with expressive aphasia that started around 5:00 PM.  Patient reports that she was attempting to write a Facebook message and what she was typing was different than what she was thinking.  Patient also describes discoordination of the right upper extremity.  Patient states that her symptoms resolved while waiting in the emergency department.  She denies chest pain, chest tightness, shortness of breath, nausea, vomiting or abdominal pain.        Past Medical History:  Diagnosis Date  . Arthritis   . Breast cancer ([place]) 1986   left mastectomy  . Cancer ([place]) 1986   left breast  . Collagen vascular disease ([place])    RA   . Elevated triglycerides with high cholesterol   . GERD (gastroesophageal reflux disease)    history of reflux  . Hypercholesterolemia   . Hypertension     Patient Active Problem List   Diagnosis Date Noted  . Fatigue 01/06/2016  . Urinary tract infection, site not specified 11/21/2015  . S/P total knee arthroplasty 11/03/2015  . Knee pain 09/28/2015  . Myalgia 09/28/2015  . Allergic rhinitis 01/06/2015  . Confusion state 01/06/2015  . Dermatitis, eczematoid 01/06/2015  . Episode of hypertension 01/06/2015  . Hypercholesterolemia 01/06/2015  . Angiopathy 01/06/2015  . Malignant neoplasm of breast ([place]) 01/06/2015  . Adiposity 01/06/2015  . Osteoarthritis 01/06/2015  . Osteopenia 01/06/2015  . Avitaminosis D 01/06/2015    Past Surgical History:  Procedure Laterality Date  . ABDOMINAL HYSTERECTOMY  440102   TAH/BSO  . BREAST BIOPSY Right 01/23/2013   [place] bx/clip-neg  .  BREAST SURGERY Left 07/1984   Mastectomy  . callus removed from left toe    . CATARACT EXTRACTION W/ INTRAOCULAR LENS IMPLANT Left 2015  . EYE SURGERY Left 2013  . FOOT SURGERY Left 04/2013  . KNEE ARTHROPLASTY Right 11/03/2015   Procedure: COMPUTER ASSISTED TOTAL KNEE ARTHROPLASTY;  Surgeon: Dereck Leep, MD;  Location: [facility] ORS;  Service: Orthopedics;  Laterality: Right;  . MASTECTOMY Left 1986   positive    Prior to Admission medications   Medication Sig Start Date End Date Taking? Authorizing Provider  aspirin 81 MG tablet Take 81 mg by mouth daily.  12/12/12   [provider]  Cholecalciferol 1000 UNITS capsule Take 1,000 Units by mouth at bedtime.  12/12/12   [provider]  gabapentin (NEURONTIN) 100 MG capsule Take 1 capsule (100 mg total) by mouth 2 (two) times daily. 07/18/18   [name]., MD  ketoconazole (NIZORAL) 2 % shampoo SHAMPOO LEAVE ON FOR FIVE MINUTES THEN RINSE DAILY UNTIL CLEAR THEN WEEKLY FOR MAINTENANCE 04/18/18   [provider]  meloxicam (MOBIC) 15 MG tablet Take 15 mg by mouth daily.    [provider]  MULTIPLE VITAMIN PO Take 1 tablet by mouth daily.  12/12/12   [provider]  nystatin cream (MYCOSTATIN) Apply 1 application topically 2 (two) times daily. 12/06/17   [name]., MD  OMEGA-3 FATTY ACIDS PO Take 1 capsule by mouth daily.  12/12/12   [provider]  rosuvastatin (CRESTOR) 20 MG tablet Take 0.5 tablets (10  mg total) by mouth daily. 06/06/17   [name]., MD  traMADol (ULTRAM) 50 MG tablet Take 1 tablet (50 mg total) by mouth 2 (two) times daily. 08/28/18   Hollice Espy, MD    Allergies Penicillin v, Penicillin v potassium, and Sulfa antibiotics  Family History  Problem Relation Age of Onset  . Cancer Mother        baldder and breast  . Glaucoma Mother   . Kidney disease Mother   . Arthritis Mother   . Stroke Mother   . Breast cancer Mother 18  . Heart  disease Father   . Cancer Brother        lung cancer, smoker  . Breast cancer Brother   . Breast cancer Cousin        3 mat cousins    Social History Social History   Tobacco Use  . Smoking status: Never Smoker  . Smokeless tobacco: Never Used  Substance Use Topics  . Alcohol use: No  . Drug use: No     Review of Systems  Constitutional: No fever/chills Eyes: No visual changes. No discharge ENT: No upper respiratory complaints. Cardiovascular: no chest pain. Respiratory: no cough. No SOB. Gastrointestinal: No abdominal pain.  No nausea, no vomiting.  No diarrhea.  No constipation. Musculoskeletal: Negative for musculoskeletal pain. Skin: Negative for rash, abrasions, lacerations, ecchymosis. Neurological: Patient had expressive aphasia and discoordination of the right upper extremity.  ____________________________________________   PHYSICAL EXAM:  VITAL SIGNS: ED Triage Vitals  Enc Vitals Group     BP 09/08/18 1858 133/71     Pulse Rate 09/08/18 1858 66     Resp 09/08/18 1858 18     Temp 09/08/18 1858 98.8 F (37.1 C)     Temp Source 09/08/18 1858 Oral     SpO2 09/08/18 1848 95 %     Weight 09/08/18 1859 215 lb (97.5 kg)     Height 09/08/18 1859 5\' 3"  (1.6 m)     Head Circumference --      Peak Flow --      Pain Score 09/08/18 1859 0     Pain Loc --      Pain Edu? --      Excl. in [place]? --      Constitutional: Alert and oriented. Well appearing and in no acute distress. Eyes: Conjunctivae are normal. PERRL. EOMI. Head: Atraumatic. ENT: Neck: No stridor.  FROM.  Cardiovascular: Normal rate, regular rhythm. Normal S1 and S2.  Good peripheral circulation. Respiratory: Normal respiratory effort without tachypnea or retractions. Lungs CTAB. Good air entry to the bases with no decreased or absent breath sounds. Gastrointestinal: Bowel sounds 4 quadrants. Soft and nontender to palpation. No guarding or rigidity. No palpable masses. No distention. No CVA  tenderness. Musculoskeletal: Full range of motion to all extremities. No gross deformities appreciated.  Patient has symmetric grip strength. Neurologic:  Normal speech and language. No gross focal neurologic deficits are appreciated.  Patient can perform hand to nose and rapid alternating movements.  No hypo-or hyperreflexia. Skin:  Skin is warm, dry and intact. No rash noted. Psychiatric: Mood and affect are normal. Speech and behavior are normal. Patient exhibits appropriate insight and judgement.   ____________________________________________   LABS (all labs ordered are listed, but only abnormal results are displayed)  Labs Reviewed  COMPREHENSIVE METABOLIC PANEL - Abnormal; Notable for the following components:      Result Value   Glucose, Bld 107 (*)  BUN 27 (*)    Total Protein 6.0 (*)    Albumin 3.3 (*)    All other components within normal limits  URINALYSIS, COMPLETE (UACMP) WITH MICROSCOPIC - Abnormal; Notable for the following components:   Color, Urine STRAW (*)    APPearance CLEAR (*)    Hgb urine dipstick LARGE (*)    Leukocytes,Ua TRACE (*)    RBC / HPF >50 (*)    All other components within normal limits  SARS CORONAVIRUS 2 (HOSPITAL ORDER, [place] [facility])  CBC WITH DIFFERENTIAL/PLATELET  LACTIC ACID, PLASMA  LACTIC ACID, PLASMA   ____________________________________________  EKG   ____________________________________________  RADIOLOGY I personally viewed and evaluated these images as part of my medical decision making, as well as reviewing the written report by the radiologist.  Ct Head Wo Contrast  Result Date: 09/08/2018 CLINICAL DATA:  Aphasia, confusion, onset of symptoms at 1700 hours but cleared, though taking longer than her previous TIA symptoms EXAM: CT HEAD WITHOUT CONTRAST TECHNIQUE: Contiguous axial images were obtained from the base of the skull through the vertex without intravenous contrast. Sagittal and coronal  MPR images reconstructed from axial data set. COMPARISON:  CT head 05/07/2014, MR brain 02/06/2018 FINDINGS: Brain: Mild atrophy. Normal ventricular morphology. No midline shift or mass effect. Mild small vessel chronic ischemic changes of deep cerebral white matter. No intracranial hemorrhage, mass lesion or evidence acute infarction. Vascular: Unremarkable.  No hyperdense vessels. Skull: Intact Sinuses/Orbits: Clear Other: N/A IMPRESSION: Atrophy with mild small vessel chronic ischemic changes of deep cerebral white matter. No acute intracranial abnormalities. Electronically Signed   By: Lavonia Dana M.D.   On: 09/08/2018 20:14    ____________________________________________    PROCEDURES  Procedure(s) performed:    Procedures    Medications  aspirin tablet 325 mg (has no administration in time range)     ____________________________________________   INITIAL IMPRESSION / ASSESSMENT AND PLAN / ED COURSE  Pertinent labs & imaging results that were available during my care of the patient were reviewed by me and considered in my medical decision making (see chart for details).  Review of the [place] CSRS was performed in accordance of the [place] prior to dispensing any controlled drugs.        Assessment and Plan: TIA 84 year old female presents to the emergency department with concern for expressive aphasia that started around 5:00 PM tonight.  Vital signs are stable in emergency department.  Neuro exam is reassuring.  Differential diagnosis includes TIA, CVA, subarachnoid hemorrhage and subdural hematoma...  Basic labs were reassuring in the emergency department.  CT head reveals no evidence of acute abnormality.  Prime doc on call, Dr. Jannifer Franklin, was consulted and patient was accepted for admission.   All patient questions were answered.      ____________________________________________  FINAL CLINICAL IMPRESSION(S) / ED DIAGNOSES  Final diagnoses:  TIA (transient  ischemic attack)      NEW MEDICATIONS STARTED DURING THIS VISIT:  ED Discharge Orders    None          This chart was dictated using voice recognition software/Dragon. Despite best efforts to proofread, errors can occur which can change the meaning. Any change was purely unintentional.    Karren Cobble 09/08/18 2106    Duffy Bruce, MD 09/11/18 2003

## 2018-09-08 NOTE — ED Notes (Signed)
Pt assisted to use bedside toilet. Urine sample sent to lab.

## 2018-09-08 NOTE — ED Notes (Signed)
Patient transported to CT 

## 2018-09-08 NOTE — ED Notes (Signed)
Pt assisted back into bed after using the bedside toilet. Pt urinated 350cc.

## 2018-09-08 NOTE — ED Notes (Signed)
Attempt to call report, RN not available.   

## 2018-09-08 NOTE — ED Notes (Signed)
Dr. Willis at bedside  

## 2018-09-08 NOTE — ED Triage Notes (Signed)
Patient arrives from home with history of TIA, began having symptoms like aphasia and confusion around 5pm, cleared up but took longer than her TIAs usually last. Arrives AxO x4, NAD

## 2018-09-08 NOTE — H&P (Signed)
[facility] NAME: Elizabeth Trujillo    MR#:  [id number]  DATE OF BIRTH:  03/05/1936  DATE OF ADMISSION:  09/08/2018  PRIMARY CARE PHYSICIAN: [name]., MD   REQUESTING/REFERRING PHYSICIAN: Sherral Hammers, [place]  CHIEF COMPLAINT:   Chief Complaint  Patient presents with  . Altered Mental Status  . Aphasia    HISTORY OF PRESENT ILLNESS:  Elizabeth Trujillo  is a 83 y.o. female who presents with chief complaint as above.  Patient presents to the ED after an episode of atypical a aphasia and some right hand weakness.  She states that she has had TIAs in the past where she had some speech difficulty.  These are always short-lived.  Today her symptoms were lasting quite a bit longer.  She was at the computer and was typing, but was unable to type what she was thinking.  She also noticed then that she had difficulty saying it as well.  She experienced some mild right hand weakness.  She decided to come to the ED for evaluation when the symptoms were not improving within a few short period of time.  At this time her symptoms have not resolved.  However, hospitalist were called for admission and further evaluation  PAST MEDICAL HISTORY:   Past Medical History:  Diagnosis Date  . Arthritis   . Breast cancer ([place]) 1986   left mastectomy  . Cancer ([place]) 1986   left breast  . Collagen vascular disease ([place])    RA   . Elevated triglycerides with high cholesterol   . GERD (gastroesophageal reflux disease)    history of reflux  . Hypercholesterolemia   . Hypertension      PAST SURGICAL HISTORY:   Past Surgical History:  Procedure Laterality Date  . ABDOMINAL HYSTERECTOMY  [id number]   TAH/BSO  . BREAST BIOPSY Right 01/23/2013   [place] bx/clip-neg  . BREAST SURGERY Left 07/1984   Mastectomy  . callus removed from left toe    . CATARACT EXTRACTION W/ INTRAOCULAR LENS IMPLANT Left 2015  . EYE SURGERY Left 2013  . FOOT SURGERY Left 04/2013   . KNEE ARTHROPLASTY Right 11/03/2015   Procedure: COMPUTER ASSISTED TOTAL KNEE ARTHROPLASTY;  Surgeon: Dereck Leep, MD;  Location: [facility] ORS;  Service: Orthopedics;  Laterality: Right;  . MASTECTOMY Left 1986   positive     SOCIAL HISTORY:   Social History   Tobacco Use  . Smoking status: Never Smoker  . Smokeless tobacco: Never Used  Substance Use Topics  . Alcohol use: No     FAMILY HISTORY:   Family History  Problem Relation Age of Onset  . Cancer Mother        baldder and breast  . Glaucoma Mother   . Kidney disease Mother   . Arthritis Mother   . Stroke Mother   . Breast cancer Mother 77  . Heart disease Father   . Cancer Brother        lung cancer, smoker  . Breast cancer Brother   . Breast cancer Cousin        3 mat cousins     DRUG ALLERGIES:   Allergies  Allergen Reactions  . Penicillin V   . Penicillin V Potassium Hives    Has patient had a PCN reaction causing immediate rash, facial/tongue/throat swelling, SOB or lightheadedness with hypotension: no Has patient had a PCN reaction causing severe rash involving mucus membranes or skin necrosis: {  no Has patient had a PCN reaction that required hospitalization no Has patient had a PCN reaction occurring within the last 10 years: no If all of the above answers are "NO", then may proceed with Cephalosporin use.  Ignacia Bayley Antibiotics Hives    MEDICATIONS AT HOME:   Prior to Admission medications   Medication Sig Start Date End Date Taking? Authorizing Provider  aspirin 81 MG tablet Take 81 mg by mouth daily.  12/12/12   [provider]  Cholecalciferol 1000 UNITS capsule Take 1,000 Units by mouth at bedtime.  12/12/12   [provider]  gabapentin (NEURONTIN) 100 MG capsule Take 1 capsule (100 mg total) by mouth 2 (two) times daily. 07/18/18   [name]., MD  ketoconazole (NIZORAL) 2 % shampoo SHAMPOO LEAVE ON FOR FIVE MINUTES THEN RINSE DAILY UNTIL CLEAR THEN WEEKLY FOR  MAINTENANCE 04/18/18   [provider]  meloxicam (MOBIC) 15 MG tablet Take 15 mg by mouth daily.    [provider]  MULTIPLE VITAMIN PO Take 1 tablet by mouth daily.  12/12/12   [provider]  nystatin cream (MYCOSTATIN) Apply 1 application topically 2 (two) times daily. 12/06/17   [name]., MD  OMEGA-3 FATTY ACIDS PO Take 1 capsule by mouth daily.  12/12/12   [provider]  rosuvastatin (CRESTOR) 20 MG tablet Take 0.5 tablets (10 mg total) by mouth daily. 06/06/17   [name]., MD  traMADol (ULTRAM) 50 MG tablet Take 1 tablet (50 mg total) by mouth 2 (two) times daily. 08/28/18   Hollice Espy, MD    REVIEW OF SYSTEMS:  Review of Systems  Constitutional: Negative for chills, fever, malaise/fatigue and weight loss.  HENT: Negative for ear pain, hearing loss and tinnitus.   Eyes: Negative for blurred vision, double vision, pain and redness.  Respiratory: Negative for cough, hemoptysis and shortness of breath.   Cardiovascular: Negative for chest pain, palpitations, orthopnea and leg swelling.  Gastrointestinal: Negative for abdominal pain, constipation, diarrhea, nausea and vomiting.  Genitourinary: Negative for dysuria, frequency and hematuria.  Musculoskeletal: Negative for back pain, joint pain and neck pain.  Skin:       No acne, rash, or lesions  Neurological: Positive for speech change and focal weakness. Negative for dizziness, tremors and weakness.  Endo/Heme/Allergies: Negative for polydipsia. Does not bruise/bleed easily.  Psychiatric/Behavioral: Negative for depression. The patient is not nervous/anxious and does not have insomnia.      VITAL SIGNS:   Vitals:   09/08/18 2130 09/08/18 2151 09/08/18 2152 09/08/18 2153  BP: (!) 144/71     Pulse: 62   65  Resp: 15 18 13 11   Temp:      TempSrc:      SpO2: 94%   96%  Weight:      Height:       Wt Readings from Last 3 Encounters:  09/08/18 97.5 kg  08/28/18  97.5 kg  07/25/18 100.7 kg    PHYSICAL EXAMINATION:  Physical Exam  Vitals reviewed. Constitutional: She is oriented to person, place, and time. She appears well-developed and well-nourished. No distress.  HENT:  Head: Normocephalic and atraumatic.  Mouth/Throat: Oropharynx is clear and moist.  Eyes: Pupils are equal, round, and reactive to light. Conjunctivae and EOM are normal. No scleral icterus.  Neck: Normal range of motion. Neck supple. No JVD present. No thyromegaly present.  Cardiovascular: Normal rate, regular rhythm and intact distal pulses. Exam reveals no gallop  and no friction rub.  No murmur heard. Respiratory: Effort normal and breath sounds normal. No respiratory distress. She has no wheezes. She has no rales.  GI: Soft. Bowel sounds are normal. She exhibits no distension. There is no abdominal tenderness.  Musculoskeletal: Normal range of motion.        General: No edema.     Comments: No arthritis, no gout  Lymphadenopathy:    She has no cervical adenopathy.  Neurological: She is alert and oriented to person, place, and time. No cranial nerve deficit.  Neurologic: Cranial nerves II-XII intact, Sensation intact to light touch/pinprick, 5/5 strength in all extremities, no dysarthria, no aphasia, no dysphagia, memory intact, finger to nose testing showed no abnormality  Skin: Skin is warm and dry. No rash noted. No erythema.  Psychiatric: She has a normal mood and affect. Her behavior is normal. Judgment and thought content normal.    LABORATORY PANEL:   CBC Recent Labs  Lab 09/08/18 1922  WBC 9.1  HGB 12.8  HCT 38.9  PLT 284   ------------------------------------------------------------------------------------------------------------------  Chemistries  Recent Labs  Lab 09/08/18 1922  NA 141  K 3.9  CL 105  CO2 27  GLUCOSE 107*  BUN 27*  CREATININE 0.68  CALCIUM 8.9  AST 18  ALT 16  ALKPHOS 68  BILITOT 0.3    ------------------------------------------------------------------------------------------------------------------  Cardiac Enzymes No results for input(s): TROPONINI in the last 168 hours. ------------------------------------------------------------------------------------------------------------------  RADIOLOGY:  Ct Head Wo Contrast  Result Date: 09/08/2018 CLINICAL DATA:  Aphasia, confusion, onset of symptoms at 1700 hours but cleared, though taking longer than her previous TIA symptoms EXAM: CT HEAD WITHOUT CONTRAST TECHNIQUE: Contiguous axial images were obtained from the base of the skull through the vertex without intravenous contrast. Sagittal and coronal MPR images reconstructed from axial data set. COMPARISON:  CT head 05/07/2014, MR brain 02/06/2018 FINDINGS: Brain: Mild atrophy. Normal ventricular morphology. No midline shift or mass effect. Mild small vessel chronic ischemic changes of deep cerebral white matter. No intracranial hemorrhage, mass lesion or evidence acute infarction. Vascular: Unremarkable.  No hyperdense vessels. Skull: Intact Sinuses/Orbits: Clear Other: N/A IMPRESSION: Atrophy with mild small vessel chronic ischemic changes of deep cerebral white matter. No acute intracranial abnormalities. Electronically Signed   By: Lavonia Dana M.D.   On: 09/08/2018 20:14    EKG:   Orders placed or performed in visit on 07/13/16  . EKG 12-Lead    IMPRESSION AND PLAN:  Principal Problem:   Aphasia -admit per stroke admission order set with work-up to include MRI, appropriate labs, consults including neurology consult Active Problems:   HTN (hypertension) -patient is currently normotensive, hold antihypertensives for now   Hypercholesterolemia -home dose antilipid   GERD (gastroesophageal reflux disease) -home dose PPI  Chart review performed and case discussed with ED provider. Labs, imaging and/or ECG reviewed by provider and discussed with patient/family. Management  plans discussed with the patient and/or family.  COVID-19 status: Test pending  DVT PROPHYLAXIS: SubQ lovenox   GI PROPHYLAXIS:  PPI   ADMISSION STATUS: Observation  CODE STATUS: Full Code Status History    Date Active Date Inactive Code Status Order ID Comments User Context   11/03/2015 1656 11/05/2015 1607 Full Code [id number]  Dereck Leep, MD Inpatient   Advance Care Planning Activity    Advance Directive Documentation     Most Recent Value  Type of Advance Directive  Living will  Pre-existing out of facility DNR order (yellow form or pink MOST form)  -  "  MOST" Form in Place?  -      TOTAL TIME TAKING CARE OF THIS PATIENT: 40 minutes.   This patient was evaluated in the context of the global COVID-19 pandemic, which necessitated consideration that the patient might be at risk for infection with the SARS-CoV-2 virus that causes COVID-19. Institutional protocols and algorithms that pertain to the evaluation of patients at risk for COVID-19 are in a state of rapid change based on information released by regulatory bodies including the CDC and federal and state organizations. These policies and algorithms were followed to the best of this provider's knowledge to date during the patient's care at this facility.  Ethlyn Daniels 09/08/2018, 9:55 PM  [facility]  Office  [phone]  CC: Primary care physician; [name]., MD  Note:  This document was prepared using Dragon voice recognition software and may include unintentional dictation errors.

## 2018-09-09 ENCOUNTER — Observation Stay: Payer: Medicare Other

## 2018-09-09 ENCOUNTER — Observation Stay (HOSPITAL_BASED_OUTPATIENT_CLINIC_OR_DEPARTMENT_OTHER)
Admit: 2018-09-09 | Discharge: 2018-09-09 | Disposition: A | Discharge status: Home / Self Care | Payer: Medicare Other | Attending: Internal Medicine | Admitting: Internal Medicine

## 2018-09-09 DIAGNOSIS — R4701 Aphasia: Secondary | ICD-10-CM

## 2018-09-09 DIAGNOSIS — E785 Hyperlipidemia, unspecified: Secondary | ICD-10-CM | POA: Diagnosis not present

## 2018-09-09 DIAGNOSIS — I351 Nonrheumatic aortic (valve) insufficiency: Secondary | ICD-10-CM | POA: Diagnosis not present

## 2018-09-09 DIAGNOSIS — M25511 Pain in right shoulder: Secondary | ICD-10-CM | POA: Diagnosis not present

## 2018-09-09 DIAGNOSIS — K219 Gastro-esophageal reflux disease without esophagitis: Secondary | ICD-10-CM | POA: Diagnosis not present

## 2018-09-09 DIAGNOSIS — I6782 Cerebral ischemia: Secondary | ICD-10-CM | POA: Diagnosis not present

## 2018-09-09 DIAGNOSIS — G459 Transient cerebral ischemic attack, unspecified: Secondary | ICD-10-CM | POA: Diagnosis not present

## 2018-09-09 DIAGNOSIS — I10 Essential (primary) hypertension: Secondary | ICD-10-CM | POA: Diagnosis not present

## 2018-09-09 DIAGNOSIS — I340 Nonrheumatic mitral (valve) insufficiency: Secondary | ICD-10-CM | POA: Diagnosis not present

## 2018-09-09 LAB — LIPID PANEL
Cholesterol: 252 mg/dL — ABNORMAL HIGH (ref 0–200)
HDL: 37 mg/dL — ABNORMAL LOW (ref >40)
LDL Cholesterol: 149 mg/dL — ABNORMAL HIGH (ref 0–99)
Total CHOL/HDL Ratio: 6.8 RATIO
Triglycerides: 331 mg/dL — ABNORMAL HIGH (ref <150)
VLDL: 66 mg/dL — ABNORMAL HIGH (ref 0–40)

## 2018-09-09 LAB — ECHOCARDIOGRAM COMPLETE
Height: 63 in
Weight: 3497.6 oz

## 2018-09-09 MED ORDER — ROSUVASTATIN CALCIUM 20 MG PO TABS: 20.0000 mg | ORAL_TABLET | Freq: Every day | ORAL | 12 refills | Status: DC

## 2018-09-09 MED ORDER — GABAPENTIN 100 MG PO CAPS
100.0000 mg | ORAL_CAPSULE | Freq: Two times a day (BID) | ORAL | Status: DC
  Filled 2018-09-09: qty 1

## 2018-09-09 MED ORDER — VITAMIN D3 25 MCG (1000 UNIT) PO TABS
1000.0000 [IU] | ORAL_TABLET | Freq: Every day | ORAL | Status: DC
  Filled 2018-09-09: qty 1

## 2018-09-09 MED ORDER — TRAMADOL HCL 50 MG PO TABS
50.0000 mg | ORAL_TABLET | Freq: Two times a day (BID) | ORAL | Status: DC
  Filled 2018-09-09: qty 1

## 2018-09-09 MED ORDER — CLOPIDOGREL BISULFATE 75 MG PO TABS: 75.0000 mg | ORAL_TABLET | Freq: Every day | ORAL | 11 refills | Status: DC

## 2018-09-09 MED ORDER — ATORVASTATIN CALCIUM 40 MG PO TABS: 40.0000 mg | ORAL_TABLET | Freq: Every day | ORAL | 11 refills | Status: DC

## 2018-09-09 NOTE — Progress Notes (Signed)
[facility] [place] NAME: Elizabeth Trujillo    MR#:  [id number]  DATE OF BIRTH:  [birth year]  SUBJECTIVE:  CHIEF COMPLAINT:   Chief Complaint  Patient presents with   Altered Mental Status   Aphasia   -Denies any complaints now.  Had some expressive aphasia and right hand weakness on presentation which have completely resolved.  Stroke work-up is negative.  REVIEW OF SYSTEMS:  Review of Systems  Constitutional: Negative for chills, fever and malaise/fatigue.  HENT: Negative for congestion, ear discharge, hearing loss and nosebleeds.   Eyes: Negative for blurred vision and double vision.  Respiratory: Negative for cough, shortness of breath and wheezing.   Cardiovascular: Negative for chest pain and palpitations.  Gastrointestinal: Negative for abdominal pain, constipation, diarrhea, nausea and vomiting.  Genitourinary: Negative for dysuria.  Musculoskeletal: Negative for myalgias.  Neurological: Negative for dizziness, focal weakness, seizures, weakness and headaches.  Psychiatric/Behavioral: Negative for depression.    DRUG ALLERGIES:   Allergies  Allergen Reactions   Penicillin V    Penicillin V Potassium Hives    Has patient had a PCN reaction causing immediate rash, facial/tongue/throat swelling, SOB or lightheadedness with hypotension: no Has patient had a PCN reaction causing severe rash involving mucus membranes or skin necrosis: {no Has patient had a PCN reaction that required hospitalization no Has patient had a PCN reaction occurring within the last 10 years: no If all of the above answers are "NO", then may proceed with Cephalosporin use.   Sulfa Antibiotics Hives    VITALS:  Blood pressure (!) 148/70, pulse 69, temperature 97.6 F (36.4 C), temperature source Oral, resp. rate 18, height 5\' 3"  (1.6 m), weight 99.2 kg, SpO2 97 %.  PHYSICAL EXAMINATION:  Physical Exam   GENERAL:  83 y.o.-year-old patient lying  in the bed with no acute distress.  EYES: Pupils equal, round, reactive to light and accommodation. No scleral icterus. Extraocular muscles intact.  HEENT: Head atraumatic, normocephalic. Oropharynx and nasopharynx clear.  NECK:  Supple, no jugular venous distention. No thyroid enlargement, no tenderness.  LUNGS: Normal breath sounds bilaterally, no wheezing, rales,rhonchi or crepitation. No use of accessory muscles of respiration.  CARDIOVASCULAR: S1, S2 normal. No murmurs, rubs, or gallops.  ABDOMEN: Soft, nontender, nondistended. Bowel sounds present. No organomegaly or mass.  EXTREMITIES: No pedal edema, cyanosis, or clubbing.  NEUROLOGIC: Cranial nerves II through XII are intact. Muscle strength 5/5 in all extremities. Sensation intact. Gait not checked.  PSYCHIATRIC: The patient is alert and oriented x 3.  SKIN: No obvious rash, lesion, or ulcer.    LABORATORY PANEL:   CBC Recent Labs  Lab 09/08/18 1922  WBC 9.1  HGB 12.8  HCT 38.9  PLT 284   ------------------------------------------------------------------------------------------------------------------  Chemistries  Recent Labs  Lab 09/08/18 1922  NA 141  K 3.9  CL 105  CO2 27  GLUCOSE 107*  BUN 27*  CREATININE 0.68  CALCIUM 8.9  AST 18  ALT 16  ALKPHOS 68  BILITOT 0.3   ------------------------------------------------------------------------------------------------------------------  Cardiac Enzymes No results for input(s): TROPONINI in the last 168 hours. ------------------------------------------------------------------------------------------------------------------  RADIOLOGY:  Dg Shoulder Right  Result Date: 09/09/2018 CLINICAL DATA:  Injury.  Right shoulder pain. EXAM: RIGHT SHOULDER - 2+ VIEW COMPARISON:  Chest x-ray 05/07/2014. FINDINGS: Severe acromioclavicular and glenohumeral degenerative change. Downsloping acromion. High-riding right shoulder consistent with chronic rotator cuff tear.  Calcific changes noted in the region of supraspinatus region possibly related to supraspinatus tendinosis. No  evidence of fracture or dislocation. IMPRESSION: 1. Severe acromioclavicular and glenohumeral degenerative change. Calcific supraspinatus tendinosis cannot be excluded. High-riding right shoulder consistent chronic rotator cuff tear. 2.  No evidence of acute fracture or dislocation. Electronically Signed   By: Marcello Moores  Register   On: 09/09/2018 06:46   Ct Head Wo Contrast  Result Date: 09/08/2018 CLINICAL DATA:  Aphasia, confusion, onset of symptoms at 1700 hours but cleared, though taking longer than her previous TIA symptoms EXAM: CT HEAD WITHOUT CONTRAST TECHNIQUE: Contiguous axial images were obtained from the base of the skull through the vertex without intravenous contrast. Sagittal and coronal MPR images reconstructed from axial data set. COMPARISON:  CT head 05/07/2014, MR brain 02/06/2018 FINDINGS: Brain: Mild atrophy. Normal ventricular morphology. No midline shift or mass effect. Mild small vessel chronic ischemic changes of deep cerebral white matter. No intracranial hemorrhage, mass lesion or evidence acute infarction. Vascular: Unremarkable.  No hyperdense vessels. Skull: Intact Sinuses/Orbits: Clear Other: N/A IMPRESSION: Atrophy with mild small vessel chronic ischemic changes of deep cerebral white matter. No acute intracranial abnormalities. Electronically Signed   By: Lavonia Dana M.D.   On: 09/08/2018 20:14   Mr Brain Wo Contrast  Result Date: 09/09/2018 CLINICAL DATA:  83 y/o F; episode of aphasia and right hand weakness. EXAM: MRI HEAD WITHOUT CONTRAST TECHNIQUE: Multiplanar, multiecho pulse sequences of the brain and surrounding structures were obtained without intravenous contrast. COMPARISON:  09/08/2018 CT head.  02/06/2018 MRI head. FINDINGS: Brain: No acute infarction, hemorrhage, hydrocephalus, extra-axial collection or mass lesion. Stable nonspecific white matter  hyperintensities compatible with chronic microvascular ischemic changes and stable volume loss of the brain. Vascular: Normal flow voids. Skull and upper cervical spine: Normal marrow signal. Sinuses/Orbits: Negative. Other: None. IMPRESSION: 1. No acute intracranial abnormality identified. 2. Stable mild chronic microvascular ischemic changes and volume loss of the brain. Electronically Signed   By: Kristine Garbe M.D.   On: 09/09/2018 00:54   US Carotid Bilateral (at [facility] Only)  Result Date: 09/09/2018 CLINICAL DATA:  Aphasia.  History of TIA and hypertension. EXAM: BILATERAL CAROTID DUPLEX ULTRASOUND TECHNIQUE: Pearline Cables scale imaging, color Doppler and duplex ultrasound were performed of bilateral carotid and vertebral arteries in the neck. COMPARISON:  None. FINDINGS: Criteria: Quantification of carotid stenosis is based on velocity parameters that correlate the residual internal carotid diameter with NASCET-based stenosis levels, using the diameter of the distal internal carotid lumen as the denominator for stenosis measurement. The following velocity measurements were obtained: RIGHT ICA: 80/26 cm/sec CCA: 01/77 cm/sec SYSTOLIC ICA/CCA RATIO:  1.0 ECA: 90 cm/sec LEFT ICA: 78/22 cm/sec CCA: 93/90 cm/sec SYSTOLIC ICA/CCA RATIO:  0.9 ECA: 75 cm/sec RIGHT CAROTID ARTERY: There is no grayscale evidence of significant intimal thickening or atherosclerotic plaque affecting interrogated portions of the right carotid system. RIGHT VERTEBRAL ARTERY:  Antegrade flow LEFT CAROTID ARTERY: There is no grayscale evidence of significant intimal thickening or atherosclerotic plaque affecting interrogated portions of the left carotid system. LEFT VERTEBRAL ARTERY:  Antegrade Flow IMPRESSION: Normal carotid Doppler ultrasound. Electronically Signed   By: Sandi Mariscal M.D.   On: 09/09/2018 08:28    EKG:   Orders placed or performed in visit on 07/13/16   EKG 12-Lead    ASSESSMENT AND PLAN:    83 year old female with past medical history significant for breast cancer status post left mastectomy, arthritis, hypertension, hyperlipidemia presents to hospital secondary to transient expressive aphasia and right hand weakness.  1.  Transient ischemic attack-possible TIA with symptoms have  resolved. -Patient on aspirin and statin.  She has not been taking her statin for more than a week now due to delay in getting her refill. -LDL is increased at 149. -MRI of the brain showing no acute intracranial abnormality and stable chronic microvascular ischemic changes. -Carotid Dopplers negative.  Echocardiogram is pending -Awaiting neurology input prior to discharge -EEG has been ordered as well  2.  Episode of hematuria as outpatient-following up with urology.  Supposed to have a urethroscopy next week.  However in light of TIA, aspirin will need to be continued.  Sent message to her outpatient urologist about possible rescheduling her procedure  3.  Neuropathy-on gabapentin  4.  Arthritis-chronic right rotator cuff tear, continue home medications  5.  DVT prophylaxis-Lovenox  Worked well with physical therapy    All the records are reviewed and case discussed with Care Management/Social Workerr. Management plans discussed with the patient, family and they are in agreement.  CODE STATUS: Full Code  TOTAL TIME TAKING CARE OF THIS PATIENT: 38 minutes.   POSSIBLE D/C TODAY, DEPENDING ON CLINICAL CONDITION.   Gladstone Lighter M.D on 09/09/2018 at 1:11 PM  Between 7am to 6pm - Pager - [phone]  After 6pm go to [email or web address] - password [email or web address] [facility]  Office  [phone]  CC: Primary care physician; [name]., MD

## 2018-09-09 NOTE — Progress Notes (Signed)
*  PRELIMINARY RESULTS* Echocardiogram 2D Echocardiogram has been performed.  Sherrie Sport 09/09/2018, 8:45 AM

## 2018-09-09 NOTE — Progress Notes (Signed)
eeg completed ° °

## 2018-09-09 NOTE — Consult Note (Signed)
Referring Physician: Tressia Miners    Chief Complaint: Difficulty with speech, right hand incoordination, facial droop  HPI: Elizabeth Trujillo is an 83 y.o. female with a history of TIA who reports that on yesterday at about 5pm she noted that while typing she was unable to type what she meant to say.  Also noted that her words were not coming out right.  Felt that her right hand was difficult to control and her daughter noted that the right side of her mouth was drooping.  Patient has had many similar episodes in the past starting in 2017 that usually only last a minute or two but this episode lasted about 15 minutes therefore they presented for evaluation.   Initial NIHSS of 0.  Date last known well: Date: 09/08/2018 Time last known well: Time: 17:00 tPA Given: No: Resolution of symptoms  Past Medical History:  Diagnosis Date  . Arthritis   . Breast cancer ([place]) 1986   left mastectomy  . Cancer ([place]) 1986   left breast  . Collagen vascular disease ([place])    RA   . Elevated triglycerides with high cholesterol   . GERD (gastroesophageal reflux disease)    history of reflux  . Hypercholesterolemia   . Hypertension     Past Surgical History:  Procedure Laterality Date  . ABDOMINAL HYSTERECTOMY  [id number]   TAH/BSO  . BREAST BIOPSY Right 01/23/2013   [place] bx/clip-neg  . BREAST SURGERY Left 07/1984   Mastectomy  . callus removed from left toe    . CATARACT EXTRACTION W/ INTRAOCULAR LENS IMPLANT Left 2015  . EYE SURGERY Left 2013  . FOOT SURGERY Left 04/2013  . KNEE ARTHROPLASTY Right 11/03/2015   Procedure: COMPUTER ASSISTED TOTAL KNEE ARTHROPLASTY;  Surgeon: Dereck Leep, MD;  Location: [facility] ORS;  Service: Orthopedics;  Laterality: Right;  . MASTECTOMY Left 1986   positive    Family History  Problem Relation Age of Onset  . Cancer Mother        baldder and breast  . Glaucoma Mother   . Kidney disease Mother   . Arthritis Mother   . Stroke Mother   . Breast cancer Mother 72  .  Heart disease Father   . Cancer Brother        lung cancer, smoker  . Breast cancer Brother   . Breast cancer Cousin        3 mat cousins   Social History:  reports that she has never smoked. She has never used smokeless tobacco. She reports that she does not drink alcohol or use drugs.  Allergies:  Allergies  Allergen Reactions  . Penicillin V   . Penicillin V Potassium Hives    Has patient had a PCN reaction causing immediate rash, facial/tongue/throat swelling, SOB or lightheadedness with hypotension: no Has patient had a PCN reaction causing severe rash involving mucus membranes or skin necrosis: {no Has patient had a PCN reaction that required hospitalization no Has patient had a PCN reaction occurring within the last 10 years: no If all of the above answers are "NO", then may proceed with Cephalosporin use.  . Sulfa Antibiotics Hives    Medications:  I have reviewed the patient's current medications. Prior to Admission:  Medications Prior to Admission  Medication Sig Dispense Refill Last Dose  . aspirin 81 MG tablet Take 81 mg by mouth daily.    09/07/2018 at Unknown time  . Cholecalciferol 1000 UNITS capsule Take 1,000 Units by mouth at bedtime.  09/07/2018 at Unknown time  . gabapentin (NEURONTIN) 100 MG capsule Take 1 capsule (100 mg total) by mouth 2 (two) times daily. 60 capsule 3 09/08/2018 at Unknown time  . meloxicam (MOBIC) 15 MG tablet Take 15 mg by mouth daily.   09/08/2018 at Unknown time  . MULTIPLE VITAMIN PO Take 1 tablet by mouth daily.    09/07/2018 at Unknown time  . rosuvastatin (CRESTOR) 20 MG tablet Take 0.5 tablets (10 mg total) by mouth daily. 30 tablet 12 09/08/2018 at Unknown time  . traMADol (ULTRAM) 50 MG tablet Take 1 tablet (50 mg total) by mouth 2 (two) times daily. 30 tablet 0 09/08/2018 at Unknown time  . ketoconazole (NIZORAL) 2 % shampoo SHAMPOO LEAVE ON FOR FIVE MINUTES THEN RINSE DAILY UNTIL CLEAR THEN WEEKLY FOR MAINTENANCE     . nystatin cream  (MYCOSTATIN) Apply 1 application topically 2 (two) times daily. 30 g 1   . OMEGA-3 FATTY ACIDS PO Take 1 capsule by mouth daily.    Not Taking at Unknown time   Scheduled: . aspirin  325 mg Oral Daily  . cholecalciferol  1,000 Units Oral QHS  . enoxaparin (LOVENOX) injection  40 mg Subcutaneous Q24H  . gabapentin  100 mg Oral BID  . rosuvastatin  10 mg Oral Daily  . traMADol  50 mg Oral BID    ROS: History obtained from the patient  General ROS: negative for - chills, fatigue, fever, night sweats, weight gain or weight loss Psychological ROS: memory difficulties Ophthalmic ROS: negative for - blurry vision, double vision, eye pain or loss of vision ENT ROS: negative for - epistaxis, nasal discharge, oral lesions, sore throat, tinnitus or vertigo Allergy and Immunology ROS: negative for - hives or itchy/watery eyes Hematological and Lymphatic ROS: negative for - bleeding problems, bruising or swollen lymph nodes Endocrine ROS: negative for - galactorrhea, hair pattern changes, polydipsia/polyuria or temperature intolerance Respiratory ROS: negative for - cough, hemoptysis, shortness of breath or wheezing Cardiovascular ROS: negative for - chest pain, dyspnea on exertion, edema or irregular heartbeat Gastrointestinal ROS: abdominal pain Genito-Urinary ROS: hematuria Musculoskeletal ROS: knee pain Neurological ROS: as noted in HPI Dermatological ROS: negative for rash and skin lesion changes  Physical Examination: Blood pressure (!) 141/69, pulse 62, temperature 97.6 F (36.4 C), temperature source Oral, resp. rate 18, height 5\' 3"  (1.6 m), weight 99.2 kg, SpO2 97 %.  HEENT-  Normocephalic, no lesions, without obvious abnormality.  Normal external eye and conjunctiva.  Normal TM's bilaterally.  Normal auditory canals and external ears. Normal external nose, mucus membranes and septum.  Normal pharynx. Cardiovascular- S1, S2 normal, pulses palpable throughout   Lungs- chest clear, no  wheezing, rales, normal symmetric air entry Abdomen- soft, non-tender; bowel sounds normal; no masses,  no organomegaly Extremities- no edema Lymph-no adenopathy palpable Musculoskeletal-no joint tenderness, deformity or swelling Skin-warm and dry, no hyperpigmentation, vitiligo, or suspicious lesions  Neurological Examination   Mental Status: Alert, oriented, thought content appropriate.  Speech fluent without evidence of aphasia.  Able to follow 3 step commands without difficulty. Cranial Nerves: II: Discs flat bilaterally; Visual fields grossly normal, pupils equal, round, reactive to light and accommodation III,IV, VI: ptosis not present, extra-ocular motions intact bilaterally V,VII: smile symmetric, facial light touch sensation normal bilaterally VIII: hearing normal bilaterally IX,X: gag reflex present XI: bilateral shoulder shrug XII: midline tongue extension Motor: Right : Upper extremity   5/5    Left:     Upper extremity   5/5  Lower extremity   5/5     Lower extremity   5/5 Tone and bulk:normal tone throughout; no atrophy noted Sensory: Pinprick and light touch intact throughout, bilaterally Deep Tendon Reflexes: 2+ in the upper extremities and absent a t the ankles.  KJ's not tested due to pain Plantars: Right: downgoing   Left: downgoing Cerebellar: Normal finger-to-nose and normal heel-to-shin testing bilaterally Gait: normal gait and station    Laboratory Studies:  Basic Metabolic Panel: Recent Labs  Lab 09/08/18 1922  NA 141  K 3.9  CL 105  CO2 27  GLUCOSE 107*  BUN 27*  CREATININE 0.68  CALCIUM 8.9    Liver Function Tests: Recent Labs  Lab 09/08/18 1922  AST 18  ALT 16  ALKPHOS 68  BILITOT 0.3  PROT 6.0*  ALBUMIN 3.3*   No results for input(s): LIPASE, AMYLASE in the last 168 hours. No results for input(s): AMMONIA in the last 168 hours.  CBC: Recent Labs  Lab 09/08/18 1922  WBC 9.1  NEUTROABS 6.2  HGB 12.8  HCT 38.9  MCV 92.6   PLT 284    Cardiac Enzymes: No results for input(s): CKTOTAL, CKMB, CKMBINDEX, TROPONINI in the last 168 hours.  BNP: Invalid input(s): POCBNP  CBG: No results for input(s): GLUCAP in the last 168 hours.  Microbiology: Results for orders placed or performed during the hospital encounter of 09/08/18  SARS Coronavirus 2 (CEPHEID - Performed in [employer] [facility] hospital lab), Hosp Order     Status: None   Collection Time: 09/08/18  9:03 PM   Specimen: Nasopharyngeal Swab  Result Value Ref Range Status   SARS Coronavirus 2 NEGATIVE NEGATIVE Final    Comment: (NOTE) If result is NEGATIVE SARS-CoV-2 target nucleic acids are NOT DETECTED. The SARS-CoV-2 RNA is generally detectable in upper and lower  respiratory specimens during the acute phase of infection. The lowest  concentration of SARS-CoV-2 viral copies this assay can detect is 250  copies / mL. A negative result does not preclude SARS-CoV-2 infection  and should not be used as the sole basis for treatment or other  patient management decisions.  A negative result may occur with  improper specimen collection / handling, submission of specimen other  than nasopharyngeal swab, presence of viral mutation(s) within the  areas targeted by this assay, and inadequate number of viral copies  (<250 copies / mL). A negative result must be combined with clinical  observations, patient history, and epidemiological information. If result is POSITIVE SARS-CoV-2 target nucleic acids are DETECTED. The SARS-CoV-2 RNA is generally detectable in upper and lower  respiratory specimens dur ing the acute phase of infection.  Positive  results are indicative of active infection with SARS-CoV-2.  Clinical  correlation with patient history and other diagnostic information is  necessary to determine patient infection status.  Positive results do  not rule out bacterial infection or co-infection with other viruses. If result is PRESUMPTIVE  POSTIVE SARS-CoV-2 nucleic acids MAY BE PRESENT.   A presumptive positive result was obtained on the submitted specimen  and confirmed on repeat testing.  While 2019 novel coronavirus  (SARS-CoV-2) nucleic acids may be present in the submitted sample  additional confirmatory testing may be necessary for epidemiological  and / or clinical management purposes  to differentiate between  SARS-CoV-2 and other Sarbecovirus currently known to infect humans.  If clinically indicated additional testing with an alternate test  methodology [phone]) is advised. The SARS-CoV-2 RNA is generally  detectable in upper and  lower respiratory sp ecimens during the acute  phase of infection. The expected result is Negative. Fact Sheet for Patients:  StrictlyIdeas.no Fact Sheet for Healthcare Providers: BankingDealers.co.za This test is not yet approved or cleared by the [place] FDA and has been authorized for detection and/or diagnosis of SARS-CoV-2 by FDA under an Emergency Use Authorization (EUA).  This EUA will remain in effect (meaning this test can be used) for the duration of the COVID-19 declaration under Section 564(b)(1) of the Act, 21 U.S.C. section 360bbb-3(b)(1), unless the authorization is terminated or revoked sooner. Performed at [facility], [address] [place], [place] [address]     Coagulation Studies: No results for input(s): LABPROT, INR in the last 72 hours.  Urinalysis:  Recent Labs  Lab 09/08/18 2023  COLORURINE STRAW*  LABSPEC 1.009  PHURINE 7.0  GLUCOSEU NEGATIVE  HGBUR LARGE*  BILIRUBINUR NEGATIVE  KETONESUR NEGATIVE  PROTEINUR NEGATIVE  NITRITE NEGATIVE  LEUKOCYTESUR TRACE*    Lipid Panel:    Component Value Date/Time   CHOL 252 (H) 09/09/2018 0706   CHOL 188 05/18/2017 1217   TRIG 331 (H) 09/09/2018 0706   HDL 37 (L) 09/09/2018 0706   HDL 49 05/18/2017 1217   CHOLHDL 6.8 09/09/2018 0706    VLDL 66 (H) 09/09/2018 0706   LDLCALC 149 (H) 09/09/2018 0706   LDLCALC 91 05/18/2017 1217   LDLCALC 101 (H) 12/05/2016 1545    HgbA1C:  Lab Results  Component Value Date   HGBA1C 5.8 (H) 07/25/2018    Urine Drug Screen:  No results found for: LABOPIA, COCAINSCRNUR, LABBENZ, AMPHETMU, THCU, LABBARB  Alcohol Level: No results for input(s): ETH in the last 168 hours.   Imaging: Dg Shoulder Right  Result Date: 09/09/2018 CLINICAL DATA:  Injury.  Right shoulder pain. EXAM: RIGHT SHOULDER - 2+ VIEW COMPARISON:  Chest x-ray 05/07/2014. FINDINGS: Severe acromioclavicular and glenohumeral degenerative change. Downsloping acromion. High-riding right shoulder consistent with chronic rotator cuff tear. Calcific changes noted in the region of supraspinatus region possibly related to supraspinatus tendinosis. No evidence of fracture or dislocation. IMPRESSION: 1. Severe acromioclavicular and glenohumeral degenerative change. Calcific supraspinatus tendinosis cannot be excluded. High-riding right shoulder consistent chronic rotator cuff tear. 2.  No evidence of acute fracture or dislocation. Electronically Signed   By: Marcello Moores  Register   On: 09/09/2018 06:46   Ct Head Wo Contrast  Result Date: 09/08/2018 CLINICAL DATA:  Aphasia, confusion, onset of symptoms at 1700 hours but cleared, though taking longer than her previous TIA symptoms EXAM: CT HEAD WITHOUT CONTRAST TECHNIQUE: Contiguous axial images were obtained from the base of the skull through the vertex without intravenous contrast. Sagittal and coronal MPR images reconstructed from axial data set. COMPARISON:  CT head 05/07/2014, MR brain 02/06/2018 FINDINGS: Brain: Mild atrophy. Normal ventricular morphology. No midline shift or mass effect. Mild small vessel chronic ischemic changes of deep cerebral white matter. No intracranial hemorrhage, mass lesion or evidence acute infarction. Vascular: Unremarkable.  No hyperdense vessels. Skull: Intact  Sinuses/Orbits: Clear Other: N/A IMPRESSION: Atrophy with mild small vessel chronic ischemic changes of deep cerebral white matter. No acute intracranial abnormalities. Electronically Signed   By: Lavonia Dana M.D.   On: 09/08/2018 20:14   Mr Brain Wo Contrast  Result Date: 09/09/2018 CLINICAL DATA:  83 y/o F; episode of aphasia and right hand weakness. EXAM: MRI HEAD WITHOUT CONTRAST TECHNIQUE: Multiplanar, multiecho pulse sequences of the brain and surrounding structures were obtained without intravenous contrast. COMPARISON:  09/08/2018 CT head.  02/06/2018 MRI head. FINDINGS: Brain: No acute infarction, hemorrhage, hydrocephalus, extra-axial collection or mass lesion. Stable nonspecific white matter hyperintensities compatible with chronic microvascular ischemic changes and stable volume loss of the brain. Vascular: Normal flow voids. Skull and upper cervical spine: Normal marrow signal. Sinuses/Orbits: Negative. Other: None. IMPRESSION: 1. No acute intracranial abnormality identified. 2. Stable mild chronic microvascular ischemic changes and volume loss of the brain. Electronically Signed   By: Kristine Garbe M.D.   On: 09/09/2018 00:54   US Carotid Bilateral (at [facility] And [facility] Only)  Result Date: 09/09/2018 CLINICAL DATA:  Aphasia.  History of TIA and hypertension. EXAM: BILATERAL CAROTID DUPLEX ULTRASOUND TECHNIQUE: Pearline Cables scale imaging, color Doppler and duplex ultrasound were performed of bilateral carotid and vertebral arteries in the neck. COMPARISON:  None. FINDINGS: Criteria: Quantification of carotid stenosis is based on velocity parameters that correlate the residual internal carotid diameter with NASCET-based stenosis levels, using the diameter of the distal internal carotid lumen as the denominator for stenosis measurement. The following velocity measurements were obtained: RIGHT ICA: 80/26 cm/sec CCA: 01/09 cm/sec SYSTOLIC ICA/CCA RATIO:  1.0 ECA: 90 cm/sec LEFT ICA: 78/22 cm/sec CCA:  32/35 cm/sec SYSTOLIC ICA/CCA RATIO:  0.9 ECA: 75 cm/sec RIGHT CAROTID ARTERY: There is no grayscale evidence of significant intimal thickening or atherosclerotic plaque affecting interrogated portions of the right carotid system. RIGHT VERTEBRAL ARTERY:  Antegrade flow LEFT CAROTID ARTERY: There is no grayscale evidence of significant intimal thickening or atherosclerotic plaque affecting interrogated portions of the left carotid system. LEFT VERTEBRAL ARTERY:  Antegrade Flow IMPRESSION: Normal carotid Doppler ultrasound. Electronically Signed   By: Sandi Mariscal M.D.   On: 09/09/2018 08:28    Assessment: 83 y.o. female presenting after an episode of aphasia, right facial droop and right hand weakness.  Symptoms now resolved.  Patient on ASA at home.  MRI of the brain reviewed and shows no acute changes.  TIA and seizure on the differential.  Carotid dopplers show no evidence of hemodynamically significant stenosis.  Echocardiogram pending.  A1c pending, LDL 149.  Stroke Risk Factors - hyperlipidemia and hypertension  Plan: 1. HgbA1c pending 2. PT consult, OT consult, Speech consult 3. Echocardiogram pending 4. Prophylactic therapy-ASA 81mg  and Plavix 75mg  for three weeks with change to Plavix 75mg  daily alone after that time.   5. NPO until RN stroke swallow screen 6. Telemetry monitoring 7. Frequent neuro checks 8. Aggressive lipid management with target LDL<70. 9. EEG   Alexis Goodell, MD Neurology [phone] 09/09/2018, 11:41 AM

## 2018-09-09 NOTE — Care Management Obs Status (Signed)
[facility] NOTIFICATION   Patient Details  Name: Elizabeth Trujillo MRN: [id number] Date of Birth: Jun 19, 1935   Medicare Observation Status Notification Given:  No(Patient discharged less then 24 hours.)    Senay Sistrunk, Veronia Beets, LCSW 09/09/2018, 3:52 PM

## 2018-09-09 NOTE — Evaluation (Signed)
Physical Therapy Evaluation Patient Details Name: Elizabeth Trujillo MRN: [id number] DOB: 1936/01/09 Today's Date: 09/09/2018   History of Present Illness  Elizabeth Trujillo is a 83 y.o. female with a history of TIA, & R TKA. Pt presented to the emergency department with expressive aphasia that started around 5:00 PM on 09/08/18.  Patient reports that she was attempting to write a Facebook message and what she was typing was different than what she was thinking.  Patient also describes discoordination of the right upper extremity.  Patient states that her symptoms resolved while waiting in the emergency department. MRI (-) for acute findings.    Clinical Impression  Patient received standing at bed side, finishing up with OT evaluation, patient agrees to walk. Performed transfers with modified independence. Ambulated 150 feet with SPC and min guard. No lob, decreased gait speed due to left knee. Patient reports she feels as though she is at baseline level of functioning. No further PT needs required at this time.       Follow Up Recommendations No PT follow up    Equipment Recommendations  None recommended by PT    Recommendations for Other Services       Precautions / Restrictions Precautions Precautions: Fall Precaution Comments: Moderate fall risk Restrictions Weight Bearing Restrictions: No      Mobility  Bed Mobility Overal bed mobility: Modified Independent             General bed mobility comments: HOB raised. Increased time.  Transfers Overall transfer level: Independent               General transfer comment: Able to complete sit<>stand and sup<>sit independently with slight increased time.  Ambulation/Gait Ambulation/Gait assistance: Modified independent (Device/Increase time) Gait Distance (Feet): 150 Feet Assistive device: Straight cane   Gait velocity: decreased   General Gait Details: steady ambulation, educated on which side to use SPC on.  Stairs            Wheelchair Mobility    Modified Rankin (Stroke Patients Only)       Balance Overall balance assessment: Modified Independent                                           Pertinent Vitals/Pain Pain Assessment: No/denies pain    Home Living Family/patient expects to be discharged to:: Private residence Living Arrangements: Spouse/significant other Available Help at Discharge: Family Type of Home: House Home Access: [place]: [place]: Environmental consultant - 2 wheels;Shower seat;Cane - single point;Bedside commode;Wheelchair - manual      Prior Function Level of Independence: Independent with assistive device(s)         Comments: Uses SPC for community distances. Tends to furniture cruise in the home 2/2 increased pain/decreased ROM in her L knee. Is considering L TKA.     Hand Dominance   Dominant Hand: Right    Extremity/Trunk Assessment   Upper Extremity Assessment Upper Extremity Assessment: Defer to OT evaluation    Lower Extremity Assessment Lower Extremity Assessment: Overall WFL for tasks assessed    Cervical / Trunk Assessment Cervical / Trunk Assessment: Normal  Communication   Communication: No difficulties  Cognition Arousal/Alertness: Awake/alert Behavior During Therapy: WFL for tasks assessed/performed Overall Cognitive Status: Within Functional Limits for tasks assessed  General Comments: occasional difficulty with word finding.      General Comments General comments (skin integrity, edema, etc.): Patient uses SPC for steadying and due to pain in L knee    Exercises Other Exercises Other Exercises: Pt educated on falls prevention strateiges to support continued independence and safety during ADL tasks.   Assessment/Plan    PT Assessment Patent does not need any further PT services  PT Problem List         PT Treatment  Interventions      PT Goals (Current goals can be found in the Care Plan section)  Acute Rehab PT Goals Patient Stated Goal: To Go home PT Goal Formulation: With patient Time For Goal Achievement: 09/10/18 Potential to Achieve Goals: Good    Frequency     Barriers to discharge        Co-evaluation               AM-PAC PT "6 Clicks" Mobility  Outcome Measure Help needed turning from your back to your side while in a flat bed without using bedrails?: A Little Help needed moving from lying on your back to sitting on the side of a flat bed without using bedrails?: A Little Help needed moving to and from a bed to a chair (including a wheelchair)?: None Help needed standing up from a chair using your arms (e.g., wheelchair or bedside chair)?: None Help needed to walk in hospital room?: None Help needed climbing 3-5 steps with a railing? : None 6 Click Score: 22    End of Session Equipment Utilized During Treatment: Gait belt Activity Tolerance: Patient tolerated treatment well Patient left: in bed;with bed alarm set Nurse Communication: Mobility status PT Visit Diagnosis: Muscle weakness (generalized) (M62.81);Difficulty in walking, not elsewhere classified (R26.2);Pain Pain - Right/Left: Left Pain - part of body: Knee    Time: 1020-1030 PT Time Calculation (min) (ACUTE ONLY): 10 min   Charges:   PT Evaluation $PT Eval Low Complexity: 1 Low PT Treatments $Gait Training: 8-22 mins        Lavaris Sexson, PT, GCS 09/09/18,11:32 AM

## 2018-09-09 NOTE — Evaluation (Signed)
Occupational Therapy Evaluation Patient Details Name: CHARLEY LAFRANCE MRN: [id number] DOB: June 14, 1935 Today's Date: 09/09/2018    History of Present Illness NEHEMIE CASSERLY is a 83 y.o. female with a history of TIA, & R TKA. Pt presented to the emergency department with expressive aphasia that started around 5:00 PM on 09/08/18.  Patient reports that she was attempting to write a Facebook message and what she was typing was different than what she was thinking.  Patient also describes discoordination of the right upper extremity.  Patient states that her symptoms resolved while waiting in the emergency department. MRI (-) for acute findings.   Clinical Impression   Ms. Klumb was seen for OT evaluation this date. Prior to hospital admission, pt was independent in all aspects of ADL/IADL, using a SPC for community mobility, and denies falls history in past 12 months. Pt lives with her spouse and adult daughter in a 1 story home with a ramped entrance. Currently pt reporting symptoms have resolved. Pt demonstrates baseline independence to perform ADL and mobility tasks and no strength, sensory, coordination, cognitive, or visual deficits appreciated with assessment. No skilled OT needs identified. Will sign off. Please re-consult if additional OT needs arise.     Follow Up Recommendations  No OT follow up    Equipment Recommendations  None recommended by OT    Recommendations for Other Services       Precautions / Restrictions Precautions Precautions: Fall Precaution Comments: Moderate fall risk Restrictions Weight Bearing Restrictions: No      Mobility Bed Mobility Overal bed mobility: Modified Independent             General bed mobility comments: HOB raised. Increased time.  Transfers Overall transfer level: Independent               General transfer comment: Able to complete sit<>stand and sup<>sit independently with slight increased time.    Balance Overall  balance assessment: Modified Independent;No apparent balance deficits (not formally assessed)                                         ADL either performed or assessed with clinical judgement   ADL Overall ADL's : Independent;At baseline                                       General ADL Comments: Pt reports symptoms have resolved. She feels back to baseline for all ADL. Ambulating in room w/o AD.     Vision Baseline Vision/History: Wears glasses Wears Glasses: Distance only Patient Visual Report: No change from baseline       Perception     Praxis      Pertinent Vitals/Pain Pain Assessment: No/denies pain     Hand Dominance Right   Extremity/Trunk Assessment Upper Extremity Assessment Upper Extremity Assessment: Overall WFL for tasks assessed   Lower Extremity Assessment Lower Extremity Assessment: Overall WFL for tasks assessed   Cervical / Trunk Assessment Cervical / Trunk Assessment: Normal   Communication Communication Communication: No difficulties   Cognition Arousal/Alertness: Awake/alert Behavior During Therapy: WFL for tasks assessed/performed Overall Cognitive Status: Within Functional Limits for tasks assessed  General Comments: occasional difficulty with word finding.   General Comments  Pt prefers to [facility] for amb 2/2 increased pain in the L knee. Reports occasional instances of L knee "going out". No falls history reported.    Exercises Other Exercises Other Exercises: Pt educated on falls prevention strateiges to support continued independence and safety during ADL tasks.   Shoulder Instructions      Home Living Family/patient expects to be discharged to:: Private residence Living Arrangements: Spouse/significant other   Type of Home: House Home Access: [place]: One level         Bathroom Toilet: Handicapped height(comfort height)      Home Equipment: Environmental consultant - 2 wheels;Shower seat;Cane - single point;Bedside commode;Wheelchair - manual          Prior Functioning/Environment Level of Independence: Independent with assistive device(s)        Comments: Uses SPC for community distances. Tends to furniture cruise in the home 2/2 increased pain/decreased ROM in her L knee. Is considering L TKA.        OT Problem List: Decreased coordination;Decreased range of motion;Decreased safety awareness;Decreased knowledge of use of DME or AE;Impaired balance (sitting and/or standing)      OT Treatment/Interventions:      OT Goals(Current goals can be found in the care plan section) Acute Rehab OT Goals Patient Stated Goal: To Go home OT Goal Formulation: All assessment and education complete, DC therapy Time For Goal Achievement: 09/09/18 Potential to Achieve Goals: Good  OT Frequency:     Barriers to D/C:            Co-evaluation              AM-PAC OT "6 Clicks" Daily Activity     Outcome Measure Help from another person eating meals?: None Help from another person taking care of personal grooming?: None Help from another person toileting, which includes using toliet, bedpan, or urinal?: None Help from another person bathing (including washing, rinsing, drying)?: None Help from another person to put on and taking off regular upper body clothing?: None Help from another person to put on and taking off regular lower body clothing?: None 6 Click Score: 24   End of Session    Activity Tolerance: Patient tolerated treatment well;No increased pain Patient left: in bed;with bed alarm set;with call bell/phone within reach  OT Visit Diagnosis: Unsteadiness on feet (R26.81);Other symptoms and signs involving the nervous system (R29.898)                Time: 1001-1017 OT Time Calculation (min): 16 min Charges:  OT General Charges $OT Visit: 1 Visit OT Evaluation $OT Eval Low Complexity: [place], M.S., OTR/L Ascom: [phone] 09/09/18, 10:37 AM

## 2018-09-09 NOTE — Procedures (Signed)
ELECTROENCEPHALOGRAM REPORT   [name]       Room #: 101A-AA EEG No. ID: [id number] Age: 83 y.o.        Sex: female Referring Physician: Tressia Miners Report Date:  09/09/2018        Interpreting Physician: Alexis Goodell  History: Elizabeth Trujillo is an 83 y.o. female with recurrent episodes of aphasia  Medications:  ASA, Neurontin, Crestor, Ultram  Conditions of Recording:  This is a 21 channel routine scalp EEG performed with bipolar and monopolar montages arranged in accordance to the international 10/20 system of electrode placement. One channel was dedicated to EKG recording.  The patient is in the awake, drowsy and asleep states.  Description:  The waking background activity consists of a low voltage, symmetrical, fairly well organized, 8 Hz alpha activity, seen from the parieto-occipital and posterior temporal regions.  Low voltage fast activity, poorly organized, is seen anteriorly and is at times superimposed on more posterior regions.  A mixture of theta and alpha rhythms are seen from the central and temporal regions. The patient drowses with slowing to irregular, low voltage theta and beta activity.   The patient goes in to a light sleep with symmetrical sleep spindles, vertex central sharp transients and irregular slow activity.   No epileptiform activity is noted.   Hyperventilation was not performed. Intermittent photic stimulation was performed but failed to illicit any change in the tracing.    IMPRESSION: Normal electroencephalogram, awake, asleep and with activation procedures. There are no focal lateralizing or epileptiform features.   Alexis Goodell, MD Neurology [phone] 09/09/2018, 3:52 PM

## 2018-09-09 NOTE — Progress Notes (Signed)
SLP Cancellation Note  Patient Details Name: Elizabeth Trujillo MRN: [id number] DOB: 04-Sep-1935   Cancelled treatment:       Reason Eval/Treat Not Completed: SLP screened, no needs identified, will sign off; Chart reviewed. Screened cognitive linguistic abilities at length. Expressive and receptive language, cognition appear WFL. Spontaneous speech is fluent and 100% clear with no evidence of dysarthria. Pt denies any previous or current s/s dysphagia. SLP to sign off at this time, please re-consult if change in status requires re-assessment. Thank you for this consult.   Dailen Mcclish, MA, CCC-SLP 09/09/2018, 9:39 AM

## 2018-09-09 NOTE — Progress Notes (Signed)
Pt is being discharged home.  Discharge papers given and explained to pt. Pt verbalized understanding.  Meds and f/u appointments reviewed. Pt made aware of the need to schedule f/u appointments and to pick up Rx from pharmacy.  Awaiting transportation.

## 2018-09-10 ENCOUNTER — Other Ambulatory Visit: Payer: Self-pay | Admitting: Radiology

## 2018-09-10 DIAGNOSIS — G459 Transient cerebral ischemic attack, unspecified: Secondary | ICD-10-CM | POA: Diagnosis not present

## 2018-09-10 DIAGNOSIS — R410 Disorientation, unspecified: Secondary | ICD-10-CM | POA: Diagnosis not present

## 2018-09-10 LAB — HEMOGLOBIN A1C
Hgb A1c MFr Bld: 6.1 % — ABNORMAL HIGH (ref 4.8–5.6)
Mean Plasma Glucose: 128 mg/dL

## 2018-09-10 NOTE — Discharge Summary (Signed)
[place] [facility] [place] NAME: Elizabeth Trujillo    MR#:  [id number]  DATE OF BIRTH:  Sep 21, 1935  DATE OF ADMISSION:  09/08/2018   ADMITTING PHYSICIAN: Lance Coon, MD  DATE OF DISCHARGE: 09/09/2018  6:23 PM  PRIMARY CARE PHYSICIAN: [name]., MD   ADMISSION DIAGNOSIS:   TIA (transient ischemic attack) [G45.9]  DISCHARGE DIAGNOSIS:   Principal Problem:   Aphasia Active Problems:   Hypercholesterolemia   HTN (hypertension)   GERD (gastroesophageal reflux disease)   SECONDARY DIAGNOSIS:   Past Medical History:  Diagnosis Date  . Arthritis   . Breast cancer ([place]) 1986   left mastectomy  . Cancer ([place]) 1986   left breast  . Collagen vascular disease ([place])    RA   . Elevated triglycerides with high cholesterol   . GERD (gastroesophageal reflux disease)    history of reflux  . Hypercholesterolemia   . Hypertension     HOSPITAL COURSE:   83 year old female with past medical history significant for breast cancer status post left mastectomy, arthritis, hypertension, hyperlipidemia presents to hospital secondary to transient expressive aphasia and right hand weakness.  1.  Transient ischemic attack- possible TIA with symptoms have resolved. -Patient on aspirin and statin.  Appreciate neurology consult.  Plavix has been added.  Continue dual antiplatelet treatment for 3 weeks and then changed to single antiplatelet agent, likely Plavix.  She has not been taking her statin for more than a week now due to delay in getting her refill.  Lipitor has been prescribed at discharge.-LDL is increased at 149. -MRI of the brain showing no acute intracranial abnormality and stable chronic microvascular ischemic changes. -Carotid Dopplers negative.  Echocardiogram is with negative bubble study -EEG has been ordered and it is normal. -Physical therapy recommended no needs at discharge.  2.  Episode of hematuria as  outpatient-following up with urology.  Supposed to have a urethroscopy next week.  However in light of TIA, aspirin and Plavix have been started.  Continue dual antiplatelet agent for 3 weeks and follow-up with neurology as outpatient to see if she can be changed over to single antiplatelet agent at that time. - Notified her outpatient urologist Dr. Erlene Quan about possible rescheduling the procedure  3.  Neuropathy-on gabapentin  4.  Arthritis-chronic right rotator cuff tear, continue home medications   Worked well with physical therapy.  Discharge home today  DISCHARGE CONDITIONS:   Guarded  CONSULTS OBTAINED:   Treatment Team:  Catarina Hartshorn, MD  DRUG ALLERGIES:   Allergies  Allergen Reactions  . Penicillin V   . Penicillin V Potassium Hives    Has patient had a PCN reaction causing immediate rash, facial/tongue/throat swelling, SOB or lightheadedness with hypotension: no Has patient had a PCN reaction causing severe rash involving mucus membranes or skin necrosis: {no Has patient had a PCN reaction that required hospitalization no Has patient had a PCN reaction occurring within the last 10 years: no If all of the above answers are "NO", then may proceed with Cephalosporin use.  . Sulfa Antibiotics Hives   DISCHARGE MEDICATIONS:   Allergies as of 09/09/2018      Reactions   Penicillin V    Penicillin V Potassium Hives   Has patient had a PCN reaction causing immediate rash, facial/tongue/throat swelling, SOB or lightheadedness with hypotension: no Has patient had a PCN reaction causing severe rash involving mucus membranes or skin necrosis: {no Has patient had a PCN  reaction that required hospitalization no Has patient had a PCN reaction occurring within the last 10 years: no If all of the above answers are "NO", then may proceed with Cephalosporin use.   Sulfa Antibiotics Hives      Medication List    STOP taking these medications   rosuvastatin 20 MG tablet  Commonly known as: CRESTOR     TAKE these medications   aspirin 81 MG tablet Take 81 mg by mouth daily.   atorvastatin 40 MG tablet Commonly known as: Lipitor Take 1 tablet (40 mg total) by mouth daily.   Cholecalciferol 25 MCG (1000 UT) capsule Take 1,000 Units by mouth at bedtime.   clopidogrel 75 MG tablet Commonly known as: Plavix Take 1 tablet (75 mg total) by mouth daily.   gabapentin 100 MG capsule Commonly known as: NEURONTIN Take 1 capsule (100 mg total) by mouth 2 (two) times daily.   ketoconazole 2 % shampoo Commonly known as: NIZORAL SHAMPOO LEAVE ON FOR FIVE MINUTES THEN RINSE DAILY UNTIL CLEAR THEN WEEKLY FOR MAINTENANCE   meloxicam 15 MG tablet Commonly known as: MOBIC Take 15 mg by mouth daily.   MULTIPLE VITAMIN PO Take 1 tablet by mouth daily.   nystatin cream Commonly known as: MYCOSTATIN Apply 1 application topically 2 (two) times daily.   OMEGA-3 FATTY ACIDS PO Take 1 capsule by mouth daily.   traMADol 50 MG tablet Commonly known as: ULTRAM Take 1 tablet (50 mg total) by mouth 2 (two) times daily.        DISCHARGE INSTRUCTIONS:   1.  PCP follow-up in 1 to 2 weeks 2.  Neurology follow-up in 2 to 3 weeks  DIET:   Cardiac diet  ACTIVITY:   Activity as tolerated  OXYGEN:   Home Oxygen: No.  Oxygen Delivery: room air  DISCHARGE LOCATION:   home   If you experience worsening of your admission symptoms, develop shortness of breath, life threatening emergency, suicidal or homicidal thoughts you must seek medical attention immediately by calling 911 or calling your MD immediately  if symptoms less severe.  You Must read complete instructions/literature along with all the possible adverse reactions/side effects for all the Medicines you take and that have been prescribed to you. Take any new Medicines after you have completely understood and accpet all the possible adverse reactions/side effects.   Please note  You were cared for  by a hospitalist during your hospital stay. If you have any questions about your discharge medications or the care you received while you were in the hospital after you are discharged, you can call the unit and asked to speak with the hospitalist on call if the hospitalist that took care of you is not available. Once you are discharged, your primary care physician will handle any further medical issues. Please note that NO REFILLS for any discharge medications will be authorized once you are discharged, as it is imperative that you return to your primary care physician (or establish a relationship with a primary care physician if you do not have one) for your aftercare needs so that they can reassess your need for medications and monitor your lab values.    On the day of Discharge:  VITAL SIGNS:   Blood pressure (!) 149/57, pulse 63, temperature 97.6 F (36.4 C), temperature source Oral, resp. rate 17, height 5\' 3"  (1.6 m), weight 99.2 kg, SpO2 98 %.  PHYSICAL EXAMINATION:    GENERAL:  83 y.o.-year-old patient lying in the bed with  no acute distress.  EYES: Pupils equal, round, reactive to light and accommodation. No scleral icterus. Extraocular muscles intact.  HEENT: Head atraumatic, normocephalic. Oropharynx and nasopharynx clear.  NECK:  Supple, no jugular venous distention. No thyroid enlargement, no tenderness.  LUNGS: Normal breath sounds bilaterally, no wheezing, rales,rhonchi or crepitation. No use of accessory muscles of respiration.  CARDIOVASCULAR: S1, S2 normal. No murmurs, rubs, or gallops.  ABDOMEN: Soft, nontender, nondistended. Bowel sounds present. No organomegaly or mass.  EXTREMITIES: No pedal edema, cyanosis, or clubbing.  NEUROLOGIC: Cranial nerves II through XII are intact. Muscle strength 5/5 in all extremities. Sensation intact. Gait not checked.  PSYCHIATRIC: The patient is alert and oriented x 3.  SKIN: No obvious rash, lesion, or ulcer.   DATA REVIEW:   CBC  Recent Labs  Lab 09/08/18 1922  WBC 9.1  HGB 12.8  HCT 38.9  PLT 284    Chemistries  Recent Labs  Lab 09/08/18 1922  NA 141  K 3.9  CL 105  CO2 27  GLUCOSE 107*  BUN 27*  CREATININE 0.68  CALCIUM 8.9  AST 18  ALT 16  ALKPHOS 68  BILITOT 0.3     Microbiology Results  Results for orders placed or performed during the hospital encounter of 09/08/18  SARS Coronavirus 2 (CEPHEID - Performed in [place] hospital lab), Hosp Order     Status: None   Collection Time: 09/08/18  9:03 PM   Specimen: Nasopharyngeal Swab  Result Value Ref Range Status   SARS Coronavirus 2 NEGATIVE NEGATIVE Final    Comment: (NOTE) If result is NEGATIVE SARS-CoV-2 target nucleic acids are NOT DETECTED. The SARS-CoV-2 RNA is generally detectable in upper and lower  respiratory specimens during the acute phase of infection. The lowest  concentration of SARS-CoV-2 viral copies this assay can detect is 250  copies / mL. A negative result does not preclude SARS-CoV-2 infection  and should not be used as the sole basis for treatment or other  patient management decisions.  A negative result may occur with  improper specimen collection / handling, submission of specimen other  than nasopharyngeal swab, presence of viral mutation(s) within the  areas targeted by this assay, and inadequate number of viral copies  (<250 copies / mL). A negative result must be combined with clinical  observations, patient history, and epidemiological information. If result is POSITIVE SARS-CoV-2 target nucleic acids are DETECTED. The SARS-CoV-2 RNA is generally detectable in upper and lower  respiratory specimens dur ing the acute phase of infection.  Positive  results are indicative of active infection with SARS-CoV-2.  Clinical  correlation with patient history and other diagnostic information is  necessary to determine patient infection status.  Positive results do  not rule out bacterial infection or  co-infection with other viruses. If result is PRESUMPTIVE POSTIVE SARS-CoV-2 nucleic acids MAY BE PRESENT.   A presumptive positive result was obtained on the submitted specimen  and confirmed on repeat testing.  While 2019 novel coronavirus  (SARS-CoV-2) nucleic acids may be present in the submitted sample  additional confirmatory testing may be necessary for epidemiological  and / or clinical management purposes  to differentiate between  SARS-CoV-2 and other Sarbecovirus currently known to infect humans.  If clinically indicated additional testing with an alternate test  methodology [phone]) is advised. The SARS-CoV-2 RNA is generally  detectable in upper and lower respiratory sp ecimens during the acute  phase of infection. The expected result is Negative. Fact Sheet for Patients:  StrictlyIdeas.no Fact Sheet for Healthcare Providers: BankingDealers.co.za This test is not yet approved or cleared by the [place] FDA and has been authorized for detection and/or diagnosis of SARS-CoV-2 by FDA under an Emergency Use Authorization (EUA).  This EUA will remain in effect (meaning this test can be used) for the duration of the COVID-19 declaration under Section 564(b)(1) of the Act, 21 U.S.C. section 360bbb-3(b)(1), unless the authorization is terminated or revoked sooner. Performed at [facility], [address] [place], [place] [address]     RADIOLOGY:  No results found.   Management plans discussed with the patient, family and they are in agreement.  CODE STATUS:  Code Status History    Date Active Date Inactive Code Status Order ID Comments User Context   09/08/2018 2317 09/09/2018 2123 Full Code [id number]  Lance Coon, MD Inpatient   11/03/2015 1656 11/05/2015 1607 Full Code [id number]  Dereck Leep, MD Inpatient   Advance Care Planning Activity    Advance Directive Documentation     Most Recent Value  Type  of Advance Directive  Healthcare Power of [place], Living will  Pre-existing out of facility DNR order (yellow form or pink MOST form)  -  "MOST" Form in Place?  -      TOTAL TIME TAKING CARE OF THIS PATIENT: 38 minutes.    Gladstone Lighter M.D on 09/10/2018 at 1:50 PM  Between 7am to 6pm - Pager - [phone]  After 6pm go to [email or web address] - password [email or web address] [facility]  [employer] [facility]  Office  [phone]  CC: Primary care physician; [name]., MD   Note: This dictation was prepared with Dragon dictation along with smaller phrase technology. Any transcriptional errors that result from this process are unintentional.

## 2018-09-11 ENCOUNTER — Other Ambulatory Visit: Payer: Self-pay

## 2018-09-11 ENCOUNTER — Emergency Department: Payer: Medicare Other

## 2018-09-11 ENCOUNTER — Inpatient Hospital Stay: Payer: Self-pay | Admitting: Family Medicine

## 2018-09-11 ENCOUNTER — Emergency Department
Admission: EM | Admit: 2018-09-11 | Discharge: 2018-09-11 | Disposition: A | Discharge status: Home / Self Care | Payer: Medicare Other | Attending: Emergency Medicine | Admitting: Emergency Medicine

## 2018-09-11 DIAGNOSIS — I10 Essential (primary) hypertension: Secondary | ICD-10-CM | POA: Insufficient documentation

## 2018-09-11 DIAGNOSIS — Z853 Personal history of malignant neoplasm of breast: Secondary | ICD-10-CM | POA: Insufficient documentation

## 2018-09-11 DIAGNOSIS — Z79899 Other long term (current) drug therapy: Secondary | ICD-10-CM | POA: Diagnosis not present

## 2018-09-11 DIAGNOSIS — R4182 Altered mental status, unspecified: Secondary | ICD-10-CM | POA: Diagnosis not present

## 2018-09-11 DIAGNOSIS — Z7902 Long term (current) use of antithrombotics/antiplatelets: Secondary | ICD-10-CM | POA: Diagnosis not present

## 2018-09-11 DIAGNOSIS — Z7982 Long term (current) use of aspirin: Secondary | ICD-10-CM | POA: Insufficient documentation

## 2018-09-11 DIAGNOSIS — G459 Transient cerebral ischemic attack, unspecified: Secondary | ICD-10-CM | POA: Diagnosis not present

## 2018-09-11 DIAGNOSIS — R4781 Slurred speech: Secondary | ICD-10-CM | POA: Diagnosis not present

## 2018-09-11 LAB — CBC
HCT: 39.8 % (ref 36.0–46.0)
Hemoglobin: 13.3 g/dL (ref 12.0–15.0)
MCH: 30.6 pg (ref 26.0–34.0)
MCHC: 33.4 g/dL (ref 30.0–36.0)
MCV: 91.5 fL (ref 80.0–100.0)
Platelets: 272 10*3/uL (ref 150–400)
RBC: 4.35 MIL/uL (ref 3.87–5.11)
RDW: 13.6 % (ref 11.5–15.5)
WBC: 11.4 10*3/uL — ABNORMAL HIGH (ref 4.0–10.5)
nRBC: 0 % (ref 0.0–0.2)

## 2018-09-11 LAB — COMPREHENSIVE METABOLIC PANEL
ALT: 17 U/L (ref 0–44)
AST: 18 U/L (ref 15–41)
Albumin: 3.3 g/dL — ABNORMAL LOW (ref 3.5–5.0)
Alkaline Phosphatase: 67 U/L (ref 38–126)
Anion gap: 6 (ref 5–15)
BUN: 22 mg/dL (ref 8–23)
CO2: 24 mmol/L (ref 22–32)
Calcium: 8.7 mg/dL — ABNORMAL LOW (ref 8.9–10.3)
Chloride: 108 mmol/L (ref 98–111)
Creatinine, Ser: 0.71 mg/dL (ref 0.44–1.00)
GFR calc Af Amer: >60 mL/min (ref >60)
GFR calc non Af Amer: >60 mL/min (ref >60)
Glucose, Bld: 134 mg/dL — ABNORMAL HIGH (ref 70–99)
Potassium: 3.4 mmol/L — ABNORMAL LOW (ref 3.5–5.1)
Sodium: 138 mmol/L (ref 135–145)
Total Bilirubin: 0.5 mg/dL (ref 0.3–1.2)
Total Protein: 6.2 g/dL — ABNORMAL LOW (ref 6.5–8.1)

## 2018-09-11 MED ORDER — SODIUM CHLORIDE 0.9% FLUSH: 10.0000 mL | INTRAVENOUS | Status: DC | PRN

## 2018-09-11 MED ORDER — IOHEXOL 350 MG/ML SOLN
75.0000 mL | Freq: Once | INTRAVENOUS | Status: AC | PRN
  Administered 2018-09-11: 03:00:00 75 mL via INTRAVENOUS

## 2018-09-11 NOTE — ED Notes (Signed)
Attempt x 3 to place IV for angio unsuccessful. EDP aware. Order for IV team placement

## 2018-09-11 NOTE — Progress Notes (Signed)
Received a consult to discontinue Midline. Informed RN that IV team consult is not needed for Midline removal, and that same process of access discontinuation like that of regular peripheral IVs applies since it is not a central line.

## 2018-09-11 NOTE — ED Provider Notes (Signed)
[facility] Emergency Department Provider Note   First MD Initiated Contact with Patient 09/11/18 0007     (approximate)  I have reviewed the triage vital signs and the nursing notes.   HISTORY  Chief Complaint Altered Mental Status    HPI LIBORIA PUTNAM is a 83 y.o. female below list of previous medical conditions presents to the emergency department via EMS with history of slurred speech and mental status earlier today which is since resolved.  Patient was recently admitted and discharged on 09/09/2018 secondary to TIA.  Patient denies any symptoms at present.  Including no headache weakness numbness gait instability or visual changes.  Patient denies any difficulty speaking at present.  Patient denies any confusion.  Patient denies any  Fever.        Past Medical History:  Diagnosis Date   Arthritis    Breast cancer ([place]) 1986   left mastectomy   Cancer ([place]) 1986   left breast   Collagen vascular disease ([place])    RA    Elevated triglycerides with high cholesterol    GERD (gastroesophageal reflux disease)    history of reflux   Hypercholesterolemia    Hypertension     Patient Active Problem List   Diagnosis Date Noted   Aphasia 09/08/2018   HTN (hypertension) 09/08/2018   GERD (gastroesophageal reflux disease) 09/08/2018   Fatigue 01/06/2016   Urinary tract infection, site not specified 11/21/2015   S/P total knee arthroplasty 11/03/2015   Knee pain 09/28/2015   Myalgia 09/28/2015   Allergic rhinitis 01/06/2015   Confusion state 01/06/2015   Dermatitis, eczematoid 01/06/2015   Episode of hypertension 01/06/2015   Hypercholesterolemia 01/06/2015   Angiopathy 01/06/2015   Malignant neoplasm of breast ([place]) 01/06/2015   Adiposity 01/06/2015   Osteoarthritis 01/06/2015   Osteopenia 01/06/2015   Avitaminosis D 01/06/2015    Past Surgical History:  Procedure Laterality Date   ABDOMINAL HYSTERECTOMY   601093   TAH/BSO   BREAST BIOPSY Right 01/23/2013   [place] bx/clip-neg   BREAST SURGERY Left 07/1984   Mastectomy   callus removed from left toe     CATARACT EXTRACTION W/ INTRAOCULAR LENS IMPLANT Left 2015   EYE SURGERY Left 2013   FOOT SURGERY Left 04/2013   KNEE ARTHROPLASTY Right 11/03/2015   Procedure: COMPUTER ASSISTED TOTAL KNEE ARTHROPLASTY;  Surgeon: Dereck Leep, MD;  Location: [facility] ORS;  Service: Orthopedics;  Laterality: Right;   MASTECTOMY Left 1986   positive    Prior to Admission medications   Medication Sig Start Date End Date Taking? Authorizing Provider  aspirin 81 MG tablet Take 81 mg by mouth daily.  12/12/12  Yes [provider]  atorvastatin (LIPITOR) 40 MG tablet Take 1 tablet (40 mg total) by mouth daily. 09/09/18 09/09/19 Yes Gladstone Lighter, MD  Cholecalciferol 1000 UNITS capsule Take 1,000 Units by mouth at bedtime.  12/12/12  Yes [provider]  clopidogrel (PLAVIX) 75 MG tablet Take 1 tablet (75 mg total) by mouth daily. 09/09/18 09/09/19 Yes Gladstone Lighter, MD  gabapentin (NEURONTIN) 100 MG capsule Take 1 capsule (100 mg total) by mouth 2 (two) times daily. 07/18/18  Yes [name]., MD  ketoconazole (NIZORAL) 2 % shampoo SHAMPOO LEAVE ON FOR FIVE MINUTES THEN RINSE DAILY UNTIL CLEAR THEN WEEKLY FOR MAINTENANCE 04/18/18  Yes [provider]  meloxicam (MOBIC) 15 MG tablet Take 15 mg by mouth daily.   Yes [provider]  MULTIPLE VITAMIN PO Take  1 tablet by mouth daily.  12/12/12  Yes [provider]  nystatin cream (MYCOSTATIN) Apply 1 application topically 2 (two) times daily. 12/06/17  Yes [name]., MD  traMADol (ULTRAM) 50 MG tablet Take 1 tablet (50 mg total) by mouth 2 (two) times daily. 08/28/18  Yes Hollice Espy, MD    Allergies Penicillin v, Penicillin v potassium, and Sulfa antibiotics  Family History  Problem Relation Age of Onset   Cancer Mother        baldder and  breast   Glaucoma Mother    Kidney disease Mother    Arthritis Mother    Stroke Mother    Breast cancer Mother 68   Heart disease Father    Cancer Brother        lung cancer, smoker   Breast cancer Brother    Breast cancer Cousin        3 mat cousins    Social History Social History   Tobacco Use   Smoking status: Never Smoker   Smokeless tobacco: Never Used  Substance Use Topics   Alcohol use: No   Drug use: No    Review of Systems Constitutional: No fever/chills Eyes: No visual changes. ENT: No sore throat. Cardiovascular: Denies chest pain. Respiratory: Denies shortness of breath. Gastrointestinal: No abdominal pain.  No nausea, no vomiting.  No diarrhea.  No constipation. Genitourinary: Negative for dysuria. Musculoskeletal: Negative for neck pain.  Negative for back pain. Integumentary: Negative for rash. Neurological: Negative for headaches, focal weakness or numbness.  ____________________________________________   PHYSICAL EXAM:  VITAL SIGNS: ED Triage Vitals  Enc Vitals Group     BP 09/11/18 0008 137/67     Pulse Rate 09/11/18 0008 72     Resp 09/11/18 0008 18     Temp 09/11/18 0008 98 F (36.7 C)     Temp Source 09/11/18 0008 Oral     SpO2 09/11/18 0008 95 %     Weight 09/11/18 0013 98 kg (216 lb 0.8 oz)     Height 09/11/18 0013 1.6 m (5\' 3" )     Head Circumference --      Peak Flow --      Pain Score 09/11/18 0012 2     Pain Loc --      Pain Edu? --      Excl. in [place]? --     Constitutional: Alert and oriented. Well appearing and in no acute distress. Eyes: Conjunctivae are normal. PERRL. EOMI. Head: Atraumatic. Mouth/Throat: Mucous membranes are moist.  Oropharynx non-erythematous. Neck: No stridor.   Cardiovascular: Normal rate, regular rhythm. Good peripheral circulation. Grossly normal heart sounds. Respiratory: Normal respiratory effort.  No retractions. No audible wheezing. Gastrointestinal: Soft and nontender. No  distention.   Musculoskeletal: No lower extremity tenderness nor edema. No gross deformities of extremities. Neurologic:  Normal speech and language. No gross focal neurologic deficits are appreciated.  Skin:  Skin is warm, dry and intact. No rash noted. Psychiatric: Mood and affect are normal. Speech and behavior are normal.  ____________________________________________   LABS (all labs ordered are listed, but only abnormal results are displayed)  Labs Reviewed  CBC - Abnormal; Notable for the following components:      Result Value   WBC 11.4 (*)    All other components within normal limits  COMPREHENSIVE METABOLIC PANEL - Abnormal; Notable for the following components:   Potassium 3.4 (*)    Glucose, Bld 134 (*)    Calcium 8.7 (*)  Total Protein 6.2 (*)    Albumin 3.3 (*)    All other components within normal limits   ____________________________________________  EKG  ED ECG REPORT I, [place] Mikael Debell, the attending physician, personally viewed and interpreted this ECG.   Date: 09/11/2018  EKG Time: 12:13 AM  Rate: 71  Rhythm: Normal sinus rhythm  Axis: Normal  Intervals: Normal  ST&T Change: None  ____________________________________________  RADIOLOGY I, [place] Shakur Lembo, personally viewed and evaluated these images (plain radiographs) as part of my medical decision making, as well as reviewing the written report by the radiologist.  ED MD interpretation: CT/CT Angiogram of the head and neck  Official radiology report(s): Ct Angio Head W Or Wo Contrast  Result Date: 09/11/2018 CLINICAL DATA:  83 y/o  F; slurred speech and altered behavior. EXAM: CT ANGIOGRAPHY HEAD AND NECK TECHNIQUE: Multidetector CT imaging of the head and neck was performed using the standard protocol during bolus administration of intravenous contrast. Multiplanar CT image reconstructions and MIPs were obtained to evaluate the vascular anatomy. Carotid stenosis measurements (when applicable)  are obtained utilizing NASCET criteria, using the distal internal carotid diameter as the denominator. CONTRAST:  48mL OMNIPAQUE IOHEXOL 350 MG/ML SOLN COMPARISON:  09/09/2018 carotid ultrasound. 09/08/2018 MRI head. 09/08/2018 CT head. FINDINGS: CT HEAD FINDINGS Brain: No evidence of acute infarction, hemorrhage, hydrocephalus, extra-axial collection or mass lesion/mass effect. Stable nonspecific white matter hypodensities compatible with chronic microvascular ischemic changes. Stable of the brain. Vascular: Below. Skull: Normal. Negative for fracture or focal lesion. Sinuses: Imaged portions are clear. Orbits: No acute finding. Review of the MIP images confirms the above findings CTA NECK FINDINGS Aortic arch: Bovine variant branching. Imaged portion shows no evidence of aneurysm or dissection. No significant stenosis of the major arch vessel origins. Mild calcific atherosclerosis of the aortic arch. Right carotid system: No evidence of dissection, stenosis (50% or greater) or occlusion. Mild non stenotic calcific atherosclerosis of the carotid bifurcation. Left carotid system: No evidence of dissection, stenosis (50% or greater) or occlusion. Mild non stenotic calcific atherosclerosis of the carotid bifurcation. Vertebral arteries: Dominant. No evidence of dissection, stenosis (50% or greater) or occlusion. Skeleton: No acute fracture. Mild-to-moderate spondylosis of the cervical spine. No high-grade bony spinal canal stenosis. Other neck: 9 mm nodule left lobe of the thyroid. Upper chest: Negative. Review of the MIP images confirms the above findings CTA HEAD FINDINGS Anterior circulation: No significant stenosis, proximal occlusion, aneurysm, or vascular malformation. Mild non stenotic calcific atherosclerosis of carotid siphons. Posterior circulation: No significant stenosis, proximal occlusion, aneurysm, or vascular malformation. Venous sinuses: As permitted by contrast timing, patent. Anatomic variants:  Fetal right PCA. Review of the MIP images confirms the above findings IMPRESSION: 1. Negative stable noncontrast CT of head. 2. Patent carotid and vertebral arteries. No dissection, aneurysm, or hemodynamically significant stenosis utilizing NASCET criteria. 3. Patent anterior and posterior intracranial circulation. No large vessel occlusion, aneurysm, or significant stenosis. 4. Mild non stenotic calcific atherosclerosis of aortic arch, carotid bifurcations, and carotid siphons. Electronically Signed   By: Kristine Garbe M.D.   On: 09/11/2018 04:00   Ct Angio Neck W And/or Wo Contrast  Result Date: 09/11/2018 CLINICAL DATA:  83 y/o  F; slurred speech and altered behavior. EXAM: CT ANGIOGRAPHY HEAD AND NECK TECHNIQUE: Multidetector CT imaging of the head and neck was performed using the standard protocol during bolus administration of intravenous contrast. Multiplanar CT image reconstructions and MIPs were obtained to evaluate the vascular anatomy. Carotid stenosis measurements (when applicable)  are obtained utilizing NASCET criteria, using the distal internal carotid diameter as the denominator. CONTRAST:  6mL OMNIPAQUE IOHEXOL 350 MG/ML SOLN COMPARISON:  09/09/2018 carotid ultrasound. 09/08/2018 MRI head. 09/08/2018 CT head. FINDINGS: CT HEAD FINDINGS Brain: No evidence of acute infarction, hemorrhage, hydrocephalus, extra-axial collection or mass lesion/mass effect. Stable nonspecific white matter hypodensities compatible with chronic microvascular ischemic changes. Stable of the brain. Vascular: Below. Skull: Normal. Negative for fracture or focal lesion. Sinuses: Imaged portions are clear. Orbits: No acute finding. Review of the MIP images confirms the above findings CTA NECK FINDINGS Aortic arch: Bovine variant branching. Imaged portion shows no evidence of aneurysm or dissection. No significant stenosis of the major arch vessel origins. Mild calcific atherosclerosis of the aortic arch. Right  carotid system: No evidence of dissection, stenosis (50% or greater) or occlusion. Mild non stenotic calcific atherosclerosis of the carotid bifurcation. Left carotid system: No evidence of dissection, stenosis (50% or greater) or occlusion. Mild non stenotic calcific atherosclerosis of the carotid bifurcation. Vertebral arteries: Dominant. No evidence of dissection, stenosis (50% or greater) or occlusion. Skeleton: No acute fracture. Mild-to-moderate spondylosis of the cervical spine. No high-grade bony spinal canal stenosis. Other neck: 9 mm nodule left lobe of the thyroid. Upper chest: Negative. Review of the MIP images confirms the above findings CTA HEAD FINDINGS Anterior circulation: No significant stenosis, proximal occlusion, aneurysm, or vascular malformation. Mild non stenotic calcific atherosclerosis of carotid siphons. Posterior circulation: No significant stenosis, proximal occlusion, aneurysm, or vascular malformation. Venous sinuses: As permitted by contrast timing, patent. Anatomic variants: Fetal right PCA. Review of the MIP images confirms the above findings IMPRESSION: 1. Negative stable noncontrast CT of head. 2. Patent carotid and vertebral arteries. No dissection, aneurysm, or hemodynamically significant stenosis utilizing NASCET criteria. 3. Patent anterior and posterior intracranial circulation. No large vessel occlusion, aneurysm, or significant stenosis. 4. Mild non stenotic calcific atherosclerosis of aortic arch, carotid bifurcations, and carotid siphons. Electronically Signed   By: Kristine Garbe M.D.   On: 09/11/2018 04:00     Procedures   ____________________________________________   INITIAL IMPRESSION / MDM / ASSESSMENT AND PLAN / ED COURSE  As part of my medical decision making, I reviewed the following data within the [place] NUMBER  83 year old female presenting with above-stated history and physical exam concerning for possible TIA.  I  reviewed the patient's recent admission.  Patient is currently on appropriate management and she is taking Plavix.  CT angiogram of the head and cervical spine performed today also negative.  Patient with no focal neurological deficits while in the emergency department.  Patient also admits to normal speech and admits that she has no symptoms at present.  As patient is currently on appropriate management without any neurological deficits patient advised to follow-up with outpatient primary care provider.  *Elizabeth Trujillo was evaluated in Emergency Department on 09/11/2018 for the symptoms described in the history of present illness. She was evaluated in the context of the global COVID-19 pandemic, which necessitated consideration that the patient might be at risk for infection with the SARS-CoV-2 virus that causes COVID-19. Institutional protocols and algorithms that pertain to the evaluation of patients at risk for COVID-19 are in a state of rapid change based on information released by regulatory bodies including the CDC and federal and state organizations. These policies and algorithms were followed during the patient's care in the ED.  Some ED evaluations and interventions may be delayed as a result of limited staffing during  the pandemic.*         ____________________________________________  FINAL CLINICAL IMPRESSION(S) / ED DIAGNOSES  Final diagnoses:  TIA (transient ischemic attack)     MEDICATIONS GIVEN DURING THIS VISIT:  Medications  sodium chloride flush (NS) 0.9 % injection 10-40 mL (has no administration in time range)  iohexol (OMNIPAQUE) 350 MG/ML injection 75 mL (75 mLs Intravenous Contrast Given 09/11/18 1102)     ED Discharge Orders    None       Note:  This document was prepared using Dragon voice recognition software and may include unintentional dictation errors.   Gregor Hams, MD 09/11/18 2237

## 2018-09-11 NOTE — ED Notes (Signed)
2x2s and coban applied as mid line dressing

## 2018-09-11 NOTE — ED Triage Notes (Signed)
Pt was home and her family feels she had some slurred speech and altered behavior. Pt was just discharged on Monday after a stroke work up. Pt is at baseline now and not having any sx of stroke

## 2018-09-11 NOTE — ED Triage Notes (Signed)
Pt stated Plavix on 6/30

## 2018-09-11 NOTE — ED Notes (Addendum)
Call from IV team that RNs remove midline catheters. Confirmed with 2A charge nurse that they do indeed remove caths

## 2018-09-12 ENCOUNTER — Other Ambulatory Visit: Payer: Medicare Other

## 2018-09-16 ENCOUNTER — Other Ambulatory Visit: Payer: Self-pay

## 2018-09-16 ENCOUNTER — Encounter: Payer: Self-pay | Admitting: Family Medicine

## 2018-09-16 ENCOUNTER — Ambulatory Visit (INDEPENDENT_AMBULATORY_CARE_PROVIDER_SITE_OTHER): Payer: Medicare Other | Admitting: Family Medicine

## 2018-09-16 VITALS — BP 140/88 | HR 80 | Temp 97.9°F | Resp 16 | Ht 64.0 in | Wt 211.0 lb

## 2018-09-16 DIAGNOSIS — C50912 Malignant neoplasm of unspecified site of left female breast: Secondary | ICD-10-CM | POA: Diagnosis not present

## 2018-09-16 DIAGNOSIS — M1990 Unspecified osteoarthritis, unspecified site: Secondary | ICD-10-CM | POA: Diagnosis not present

## 2018-09-16 DIAGNOSIS — Z170 Estrogen receptor positive status [ER+]: Secondary | ICD-10-CM | POA: Diagnosis not present

## 2018-09-16 DIAGNOSIS — K219 Gastro-esophageal reflux disease without esophagitis: Secondary | ICD-10-CM

## 2018-09-16 DIAGNOSIS — I10 Essential (primary) hypertension: Secondary | ICD-10-CM | POA: Diagnosis not present

## 2018-09-16 DIAGNOSIS — G459 Transient cerebral ischemic attack, unspecified: Secondary | ICD-10-CM | POA: Diagnosis not present

## 2018-09-16 DIAGNOSIS — E7800 Pure hypercholesterolemia, unspecified: Secondary | ICD-10-CM | POA: Diagnosis not present

## 2018-09-16 DIAGNOSIS — Z6837 Body mass index (BMI) 37.0-37.9, adult: Secondary | ICD-10-CM | POA: Diagnosis not present

## 2018-09-16 MED ORDER — AMLODIPINE BESYLATE 5 MG PO TABS: 5.0000 mg | ORAL_TABLET | Freq: Every day | ORAL | 1 refills | Status: DC

## 2018-09-16 NOTE — Progress Notes (Signed)
[name] Female    DOB: 01-Aug-1935   83 y.o.   MRN: [id number] Visit Date: 09/16/2018  Today's Provider: [name], MD   Chief Complaint  Patient presents with  . Hospitalization Follow-up   Subjective:     HPI    Follow up Hospitalization  Patient was admitted to [facility] on 09/08/2018 and discharged on 09/09/2018. She was treated for TIA. Treatment for this included: MRI Brain, Carotid Doppler, EEG, all stable. Telephone follow up was done on none She reports good compliance with treatment. She reports this condition is Improved.  -----------------------------------------------------------   Patient states she feels much improved since discharge from hospital.   Allergies  Allergen Reactions  . Penicillin V   . Penicillin V Potassium Hives    Has patient had a PCN reaction causing immediate rash, facial/tongue/throat swelling, SOB or lightheadedness with hypotension: no Has patient had a PCN reaction causing severe rash involving mucus membranes or skin necrosis: {no Has patient had a PCN reaction that required hospitalization no Has patient had a PCN reaction occurring within the last 10 years: no If all of the above answers are "NO", then may proceed with Cephalosporin use.  . Sulfa Antibiotics Hives     Current Outpatient Medications:  .  aspirin 81 MG tablet, Take 81 mg by mouth daily. , Disp: , Rfl:  .  atorvastatin (LIPITOR) 40 MG tablet, Take 1 tablet (40 mg total) by mouth daily., Disp: 30 tablet, Rfl: 11 .  Cholecalciferol 1000 UNITS capsule, Take 1,000 Units by mouth at bedtime. , Disp: , Rfl:  .  clopidogrel (PLAVIX) 75 MG tablet, Take 1 tablet (75 mg total) by mouth daily., Disp: 30 tablet, Rfl: 11 .  gabapentin (NEURONTIN) 100 MG capsule, Take 1 capsule (100 mg total) by mouth 2 (two) times daily., Disp: 60 capsule, Rfl: 3 .  ketoconazole (NIZORAL) 2 % shampoo, SHAMPOO LEAVE ON FOR FIVE MINUTES THEN RINSE DAILY UNTIL CLEAR THEN  WEEKLY FOR MAINTENANCE, Disp: , Rfl:  .  meloxicam (MOBIC) 15 MG tablet, Take 15 mg by mouth daily., Disp: , Rfl:  .  MULTIPLE VITAMIN PO, Take 1 tablet by mouth daily. , Disp: , Rfl:  .  nystatin cream (MYCOSTATIN), Apply 1 application topically 2 (two) times daily., Disp: 30 g, Rfl: 1 .  traMADol (ULTRAM) 50 MG tablet, Take 1 tablet (50 mg total) by mouth 2 (two) times daily., Disp: 30 tablet, Rfl: 0  Review of Systems  Constitutional: Negative for appetite change, chills, fatigue and fever.  HENT: Negative.   Eyes: Negative.   Respiratory: Negative for chest tightness and shortness of breath.   Cardiovascular: Negative for chest pain and palpitations.  Gastrointestinal: Negative for abdominal pain, nausea and vomiting.  Endocrine: Negative.   Allergic/Immunologic: Negative.   Neurological: Negative for dizziness and weakness.  Psychiatric/Behavioral: Negative.     Social History   Tobacco Use  . Smoking status: Never Smoker  . Smokeless tobacco: Never Used  Substance Use Topics  . Alcohol use: No      Objective:   BP 140/88 (BP Location: Right Arm, Patient Position: Sitting, Cuff Size: Large)   Pulse 80   Temp 97.9 F (36.6 C) (Oral)   Resp 16   Ht 5\' 4"  (1.626 m)   Wt 211 lb (95.7 kg)   SpO2 97%   BMI 36.22 kg/m  Vitals:   09/16/18 1458  BP: 140/88  Pulse: 80  Resp: 16  Temp: 97.9 F (36.6 C)  TempSrc: Oral  SpO2: 97%  Weight: 211 lb (95.7 kg)  Height: 5\' 4"  (1.626 m)     Physical Exam Vitals signs reviewed.  Constitutional:      Appearance: She is obese.  HENT:     Head: Normocephalic and atraumatic.     Right Ear: External ear normal.     Left Ear: External ear normal.     Nose: Nose normal.     Mouth/Throat:     Pharynx: Oropharynx is clear.  Eyes:     General: No scleral icterus. Neck:     Musculoskeletal: No neck rigidity.  Cardiovascular:     Rate and Rhythm: Normal rate and regular rhythm.     Pulses: Normal pulses.     Heart sounds:  Normal heart sounds.  Pulmonary:     Effort: Pulmonary effort is normal.     Breath sounds: Normal breath sounds.  Abdominal:     Palpations: Abdomen is soft.  Lymphadenopathy:     Cervical: No cervical adenopathy.  Skin:    General: Skin is warm and dry.  Neurological:     General: No focal deficit present.     Mental Status: She is alert and oriented to person, place, and time. Mental status is at baseline.  Psychiatric:        Mood and Affect: Mood normal.        Behavior: Behavior normal.        Thought Content: Thought content normal.        Judgment: Judgment normal.      No results found for any visits on 09/16/18.     Assessment & Plan    1. TIA (transient ischemic attack) Also needs neurology f/u. Treat all risk factors. More than 50% of  25 minute visit spent in counseling or coordination of care  2. Essential hypertension Add Amlodipine 5 mg daily. RTC 2-4 weeks.  3. Osteoarthritis, unspecified osteoarthritis type, unspecified site   4. Gastroesophageal reflux disease, esophagitis presence not specified   5. Malignant neoplasm of left breast in female, estrogen receptor positive, unspecified site of breast ([place])   6. Hypercholesterolemia Pt for some reason has not been taking crestor.Now on Lipitor.  7. Class 2 severe obesity due to excess calories with serious comorbidity and body mass index (BMI) of 37.0 to 37.9 in adult [facility])     I have done the exam and reviewed the above chart and it is accurate to the best of my knowledge. Development worker, community has been used in this note in any air is in the dictation or transcription are unintentional.  [name], MD  [place]

## 2018-09-16 NOTE — Patient Instructions (Addendum)
1. Hypertension  -amlodipine 5 mg qd  -Blood pressure cuff- brand Omron or [place]  Follow up in 2-3 weeks

## 2018-09-19 ENCOUNTER — Other Ambulatory Visit: Payer: Self-pay

## 2018-09-19 DIAGNOSIS — N390 Urinary tract infection, site not specified: Secondary | ICD-10-CM

## 2018-09-19 DIAGNOSIS — N1330 Unspecified hydronephrosis: Secondary | ICD-10-CM

## 2018-09-25 DIAGNOSIS — R410 Disorientation, unspecified: Secondary | ICD-10-CM | POA: Diagnosis not present

## 2018-09-25 DIAGNOSIS — R4789 Other speech disturbances: Secondary | ICD-10-CM | POA: Diagnosis not present

## 2018-09-25 DIAGNOSIS — R404 Transient alteration of awareness: Secondary | ICD-10-CM | POA: Diagnosis not present

## 2018-09-25 DIAGNOSIS — E669 Obesity, unspecified: Secondary | ICD-10-CM | POA: Diagnosis not present

## 2018-09-25 DIAGNOSIS — R9082 White matter disease, unspecified: Secondary | ICD-10-CM | POA: Diagnosis not present

## 2018-09-26 ENCOUNTER — Telehealth: Payer: Self-pay | Admitting: Radiology

## 2018-09-26 NOTE — Telephone Encounter (Signed)
LMOM to notify patient to hold aspirin and Plavix beginning 10/07/2018 and only resume aspirin post op per clearance received from Dr Manuella Ghazi.

## 2018-09-27 ENCOUNTER — Other Ambulatory Visit: Payer: Self-pay | Admitting: Radiology

## 2018-09-27 DIAGNOSIS — Z01818 Encounter for other preprocedural examination: Secondary | ICD-10-CM

## 2018-09-27 DIAGNOSIS — N1330 Unspecified hydronephrosis: Secondary | ICD-10-CM

## 2018-09-27 DIAGNOSIS — N390 Urinary tract infection, site not specified: Secondary | ICD-10-CM

## 2018-09-30 ENCOUNTER — Ambulatory Visit: Payer: Self-pay | Admitting: Family Medicine

## 2018-10-04 ENCOUNTER — Other Ambulatory Visit: Payer: Self-pay

## 2018-10-04 ENCOUNTER — Other Ambulatory Visit
Admission: RE | Admit: 2018-10-04 | Discharge: 2018-10-04 | Disposition: A | Discharge status: Home / Self Care | Payer: Medicare Other | Attending: Urology | Admitting: Urology

## 2018-10-04 ENCOUNTER — Other Ambulatory Visit: Payer: Medicare Other

## 2018-10-04 DIAGNOSIS — N390 Urinary tract infection, site not specified: Secondary | ICD-10-CM

## 2018-10-04 DIAGNOSIS — N1330 Unspecified hydronephrosis: Secondary | ICD-10-CM

## 2018-10-04 DIAGNOSIS — Z01818 Encounter for other preprocedural examination: Secondary | ICD-10-CM | POA: Diagnosis not present

## 2018-10-04 LAB — URINALYSIS, COMPLETE (UACMP) WITH MICROSCOPIC
Glucose, UA: NEGATIVE mg/dL
Nitrite: NEGATIVE
Protein, ur: 100 mg/dL — AB
RBC / HPF: >50 RBC/hpf (ref 0–5)
Specific Gravity, Urine: 1.025 (ref 1.005–1.030)
pH: 5.5 (ref 5.0–8.0)

## 2018-10-06 LAB — URINE CULTURE: Culture: 10000 — AB

## 2018-10-07 ENCOUNTER — Encounter: Payer: Self-pay | Admitting: Family Medicine

## 2018-10-07 ENCOUNTER — Ambulatory Visit (INDEPENDENT_AMBULATORY_CARE_PROVIDER_SITE_OTHER): Payer: Medicare Other | Admitting: Family Medicine

## 2018-10-07 ENCOUNTER — Other Ambulatory Visit: Payer: Self-pay

## 2018-10-07 VITALS — BP 131/78 | HR 84 | Temp 97.7°F | Resp 18 | Wt 211.2 lb

## 2018-10-07 DIAGNOSIS — I10 Essential (primary) hypertension: Secondary | ICD-10-CM

## 2018-10-07 DIAGNOSIS — G40909 Epilepsy, unspecified, not intractable, without status epilepticus: Secondary | ICD-10-CM

## 2018-10-07 DIAGNOSIS — R600 Localized edema: Secondary | ICD-10-CM

## 2018-10-07 DIAGNOSIS — G459 Transient cerebral ischemic attack, unspecified: Secondary | ICD-10-CM | POA: Diagnosis not present

## 2018-10-07 DIAGNOSIS — E7800 Pure hypercholesterolemia, unspecified: Secondary | ICD-10-CM

## 2018-10-07 NOTE — Progress Notes (Signed)
[name] Female    DOB: 04-22-1935   83 y.o.   MRN: [id number] Visit Date: 10/07/2018  Today's Provider: [name], MD   Chief Complaint  Patient presents with  . Hyperlipidemia  . Hypertension   Subjective:     HPI 1 Month follow up for hypertension and cholesterol. Patient states that she had some swelling and redness in her left leg on Saturday and Sunday. She has not experienced any pain. She is not taking Mobic presently. No trauma or syncope.  She is undergoind seizure w/u by neurology. Allergies  Allergen Reactions  . Penicillin V   . Penicillin V Potassium Hives    Has patient had a PCN reaction causing immediate rash, facial/tongue/throat swelling, SOB or lightheadedness with hypotension: no Has patient had a PCN reaction causing severe rash involving mucus membranes or skin necrosis: {no Has patient had a PCN reaction that required hospitalization no Has patient had a PCN reaction occurring within the last 10 years: no If all of the above answers are "NO", then may proceed with Cephalosporin use.  . Sulfa Antibiotics Hives     Current Outpatient Medications:  .  amLODipine (NORVASC) 5 MG tablet, Take 1 tablet (5 mg total) by mouth daily., Disp: 30 tablet, Rfl: 1 .  aspirin 81 MG tablet, Take 81 mg by mouth daily. , Disp: , Rfl:  .  atorvastatin (LIPITOR) 40 MG tablet, Take 1 tablet (40 mg total) by mouth daily., Disp: 30 tablet, Rfl: 11 .  Cholecalciferol 1000 UNITS capsule, Take 1,000 Units by mouth at bedtime. , Disp: , Rfl:  .  clopidogrel (PLAVIX) 75 MG tablet, Take 1 tablet (75 mg total) by mouth daily., Disp: 30 tablet, Rfl: 11 .  gabapentin (NEURONTIN) 100 MG capsule, Take 1 capsule (100 mg total) by mouth 2 (two) times daily., Disp: 60 capsule, Rfl: 3 .  ketoconazole (NIZORAL) 2 % shampoo, SHAMPOO LEAVE ON FOR FIVE MINUTES THEN RINSE DAILY UNTIL CLEAR THEN WEEKLY FOR MAINTENANCE, Disp: , Rfl:  .  meloxicam (MOBIC) 15 MG tablet,  Take 15 mg by mouth daily., Disp: , Rfl:  .  MULTIPLE VITAMIN PO, Take 1 tablet by mouth daily. , Disp: , Rfl:  .  nystatin cream (MYCOSTATIN), Apply 1 application topically 2 (two) times daily., Disp: 30 g, Rfl: 1 .  traMADol (ULTRAM) 50 MG tablet, Take 1 tablet (50 mg total) by mouth 2 (two) times daily., Disp: 30 tablet, Rfl: 0  Review of Systems  Eyes: Negative.   Respiratory: Negative.   Cardiovascular: Negative.   Gastrointestinal: Negative.   Endocrine: Negative.   Genitourinary: Negative.   Musculoskeletal: Positive for joint swelling.  Allergic/Immunologic: Negative.   Neurological: Negative.   Psychiatric/Behavioral: Negative.   All other systems reviewed and are negative.   Social History   Tobacco Use  . Smoking status: Never Smoker  . Smokeless tobacco: Never Used  Substance Use Topics  . Alcohol use: No      Objective:   BP 131/78 (BP Location: Right Arm, Patient Position: Sitting, Cuff Size: Large)   Pulse 84   Temp 97.7 F (36.5 C) (Oral)   Resp 18   Wt 211 lb 3.2 oz (95.8 kg)   SpO2 99%   BMI 36.25 kg/m  Vitals:   10/07/18 1350  BP: 131/78  Pulse: 84  Resp: 18  Temp: 97.7 F (36.5 C)  TempSrc: Oral  SpO2: 99%  Weight: 211 lb 3.2 oz (95.8  kg)     Physical Exam Vitals signs reviewed.  Constitutional:      Appearance: She is obese.  HENT:     Right Ear: External ear normal.     Left Ear: External ear normal.  Eyes:     General: No scleral icterus.    Conjunctiva/sclera: Conjunctivae normal.  Cardiovascular:     Rate and Rhythm: Normal rate and regular rhythm.     Heart sounds: Normal heart sounds.  Pulmonary:     Breath sounds: Normal breath sounds.  Abdominal:     Palpations: Abdomen is soft.  Musculoskeletal:     Right lower leg: Edema present.     Left lower leg: Edema present.     Comments: 1+ ankle/pedal edema.  Skin:    General: Skin is warm and dry.  Neurological:     Mental Status: She is alert and oriented to person,  place, and time.  Psychiatric:        Mood and Affect: Mood normal.        Behavior: Behavior normal.        Thought Content: Thought content normal.        Judgment: Judgment normal.      No results found for any visits on 10/07/18.     Assessment & Plan    1. Essential hypertension Controlled. More than 50% 25 minute visit spent in counseling or coordination of care  - Comprehensive metabolic panel  2. TIA (transient ischemic attack) W/u by Dr Manuella Ghazi. - CBC with Differential/Platelet  3. Hypercholesterolemia  - Lipid panel  4. Seizure disorder ([place]) W/u per neurology  5. Pedal edema Probably due to norvasc. Stay  Off of Mobic.    Ferol Laiche Cranford Mon, MD  [facility]

## 2018-10-09 ENCOUNTER — Other Ambulatory Visit: Payer: Self-pay | Admitting: Family Medicine

## 2018-10-09 DIAGNOSIS — R102 Pelvic and perineal pain: Secondary | ICD-10-CM

## 2018-10-09 LAB — COMPREHENSIVE METABOLIC PANEL
ALT: 16 IU/L (ref 0–32)
AST: 18 IU/L (ref 0–40)
Albumin/Globulin Ratio: 1.8 (ref 1.2–2.2)
Albumin: 4.2 g/dL (ref 3.6–4.6)
Alkaline Phosphatase: 94 IU/L (ref 39–117)
BUN/Creatinine Ratio: 20 (ref 12–28)
BUN: 13 mg/dL (ref 8–27)
Bilirubin Total: 0.6 mg/dL (ref 0.0–1.2)
CO2: 22 mmol/L (ref 20–29)
Calcium: 9.6 mg/dL (ref 8.7–10.3)
Chloride: 104 mmol/L (ref 96–106)
Creatinine, Ser: 0.66 mg/dL (ref 0.57–1.00)
GFR calc Af Amer: 96 mL/min/{1.73_m2} (ref >59)
GFR calc non Af Amer: 83 mL/min/{1.73_m2} (ref >59)
Globulin, Total: 2.3 g/dL (ref 1.5–4.5)
Glucose: 102 mg/dL — ABNORMAL HIGH (ref 65–99)
Potassium: 4 mmol/L (ref 3.5–5.2)
Sodium: 144 mmol/L (ref 134–144)
Total Protein: 6.5 g/dL (ref 6.0–8.5)

## 2018-10-09 LAB — CBC WITH DIFFERENTIAL/PLATELET
Basophils Absolute: 0.1 10*3/uL (ref 0.0–0.2)
Basos: 1 %
EOS (ABSOLUTE): 0.2 10*3/uL (ref 0.0–0.4)
Eos: 2 %
Hematocrit: 45.7 % (ref 34.0–46.6)
Hemoglobin: 14.6 g/dL (ref 11.1–15.9)
Immature Grans (Abs): 0 10*3/uL (ref 0.0–0.1)
Immature Granulocytes: 0 %
Lymphocytes Absolute: 1.7 10*3/uL (ref 0.7–3.1)
Lymphs: 16 %
MCH: 30 pg (ref 26.6–33.0)
MCHC: 31.9 g/dL (ref 31.5–35.7)
MCV: 94 fL (ref 79–97)
Monocytes Absolute: 0.9 10*3/uL (ref 0.1–0.9)
Monocytes: 9 %
Neutrophils Absolute: 7.7 10*3/uL — ABNORMAL HIGH (ref 1.4–7.0)
Neutrophils: 72 %
Platelets: 320 10*3/uL (ref 150–450)
RBC: 4.86 x10E6/uL (ref 3.77–5.28)
RDW: 14.7 % (ref 11.7–15.4)
WBC: 10.5 10*3/uL (ref 3.4–10.8)

## 2018-10-09 LAB — LIPID PANEL
Chol/HDL Ratio: 4 ratio (ref 0.0–4.4)
Cholesterol, Total: 183 mg/dL (ref 100–199)
HDL: 46 mg/dL (ref >39)
LDL Calculated: 84 mg/dL (ref 0–99)
Triglycerides: 265 mg/dL — ABNORMAL HIGH (ref 0–149)
VLDL Cholesterol Cal: 53 mg/dL — ABNORMAL HIGH (ref 5–40)

## 2018-10-10 ENCOUNTER — Other Ambulatory Visit: Payer: Medicare Other

## 2018-10-10 ENCOUNTER — Encounter
Admission: RE | Admit: 2018-10-10 | Discharge: 2018-10-10 | Disposition: A | Discharge status: Home / Self Care | Payer: Medicare Other | Source: Ambulatory Visit | Attending: Urology | Admitting: Urology

## 2018-10-10 ENCOUNTER — Other Ambulatory Visit: Payer: Self-pay

## 2018-10-10 DIAGNOSIS — Z20828 Contact with and (suspected) exposure to other viral communicable diseases: Secondary | ICD-10-CM | POA: Insufficient documentation

## 2018-10-10 HISTORY — DX: Cerebral infarction, unspecified: I63.9

## 2018-10-10 NOTE — Patient Instructions (Signed)
Your procedure is scheduled on: Monday 8/3 Report to Day Surgery. To find out your arrival time please call [phone] between 1PM - 3PM on Friday 7/31.  Remember: Instructions that are not followed completely may result in serious medical risk,  up to and including death, or upon the discretion of your surgeon and anesthesiologist your  surgery may need to be rescheduled.     _X__ 1. Do not eat food after midnight the night before your procedure.                 No gum chewing or hard candies. You may drink clear liquids up to 2 hours                 before you are scheduled to arrive for your surgery- DO not drink clear                 liquids within 2 hours of the start of your surgery.                 Clear Liquids include:  water, apple juice without pulp, clear carbohydrate                 drink such as Clearfast of Gatorade, Black Coffee or Tea (Do not add                 anything to coffee or tea).  __X__2.  On the morning of surgery brush your teeth with toothpaste and water, you                may rinse your mouth with mouthwash if you wish.  Do not swallow any toothpaste of mouthwash.     ___ 3.  No Alcohol for 24 hours before or after surgery.   ___ 4.  Do Not Smoke or use e-cigarettes For 24 Hours Prior to Your Surgery.                 Do not use any chewable tobacco products for at least 6 hours prior to                 surgery.  ____  5.  Bring all medications with you on the day of surgery if instructed.   _x___  6.  Notify your doctor if there is any change in your medical condition      (cold, fever, infections).     Do not wear jewelry, make-up, hairpins, clips or nail polish. Do not wear lotions, powders, or perfumes. You may wear deodorant. Do not shave 48 hours prior to surgery. Men may shave face and neck. Do not bring valuables to the hospital.    [facility] is not responsible for any belongings or  valuables.  Contacts, dentures or bridgework may not be worn into surgery. Leave your suitcase in the car. After surgery it may be brought to your room. For patients admitted to the hospital, discharge time is determined by your treatment team.   Patients discharged the day of surgery will not be allowed to drive home.   Please read over the following fact sheets that you were given:     __x__ Take these medicines the morning of surgery with A SIP OF WATER:    1. amLODipine (NORVASC) 5 MG tablet  2. gabapentin (NEURONTIN) 100 MG capsule  3. traMADol (ULTRAM) 50 MG tablet  Or Tylenol is needed  4.  5.  6.  ____ Fleet Enema (  as directed)   ____ Use CHG Soap as directed  ____ Use inhalers on the day of surgery  ____ Stop metformin 2 days prior to surgery    ____ Take 1/2 of usual insulin dose the night before surgery. No insulin the morning          of surgery.   __x__ Stopped Plavix/aspirin 7/27 ____ Stop Anti-inflammatories on    ____ Stop supplements until after surgery.    ____ Bring C-Pap to the hospital.

## 2018-10-11 LAB — SARS CORONAVIRUS 2 (TAT 6-24 HRS): SARS Coronavirus 2: NEGATIVE

## 2018-10-13 MED ORDER — CIPROFLOXACIN IN D5W 400 MG/200ML IV SOLN
400.0000 mg | INTRAVENOUS | Status: AC
  Administered 2018-10-14: 400 mg via INTRAVENOUS

## 2018-10-14 ENCOUNTER — Ambulatory Visit: Payer: Medicare Other | Admitting: Anesthesiology

## 2018-10-14 ENCOUNTER — Telehealth: Payer: Self-pay | Admitting: Radiology

## 2018-10-14 ENCOUNTER — Telehealth: Payer: Self-pay

## 2018-10-14 ENCOUNTER — Other Ambulatory Visit: Payer: Self-pay

## 2018-10-14 ENCOUNTER — Encounter: Payer: Self-pay | Admitting: *Deleted

## 2018-10-14 ENCOUNTER — Encounter
Admission: RE | Disposition: A | Discharge status: Home / Self Care | Payer: Self-pay | Source: Home / Self Care | Attending: Urology

## 2018-10-14 ENCOUNTER — Ambulatory Visit
Admission: RE | Admit: 2018-10-14 | Discharge: 2018-10-14 | Disposition: A | Discharge status: Home / Self Care | Payer: Medicare Other | Attending: Urology | Admitting: Urology

## 2018-10-14 DIAGNOSIS — R310 Gross hematuria: Secondary | ICD-10-CM

## 2018-10-14 DIAGNOSIS — Z79899 Other long term (current) drug therapy: Secondary | ICD-10-CM | POA: Diagnosis not present

## 2018-10-14 DIAGNOSIS — N3081 Other cystitis with hematuria: Secondary | ICD-10-CM | POA: Diagnosis not present

## 2018-10-14 DIAGNOSIS — Z791 Long term (current) use of non-steroidal anti-inflammatories (NSAID): Secondary | ICD-10-CM | POA: Diagnosis not present

## 2018-10-14 DIAGNOSIS — E782 Mixed hyperlipidemia: Secondary | ICD-10-CM | POA: Diagnosis not present

## 2018-10-14 DIAGNOSIS — Z882 Allergy status to sulfonamides status: Secondary | ICD-10-CM | POA: Insufficient documentation

## 2018-10-14 DIAGNOSIS — E7800 Pure hypercholesterolemia, unspecified: Secondary | ICD-10-CM | POA: Diagnosis not present

## 2018-10-14 DIAGNOSIS — D090 Carcinoma in situ of bladder: Secondary | ICD-10-CM | POA: Diagnosis not present

## 2018-10-14 DIAGNOSIS — Z880 Allergy status to penicillin: Secondary | ICD-10-CM | POA: Diagnosis not present

## 2018-10-14 DIAGNOSIS — Z8673 Personal history of transient ischemic attack (TIA), and cerebral infarction without residual deficits: Secondary | ICD-10-CM | POA: Diagnosis not present

## 2018-10-14 DIAGNOSIS — M1990 Unspecified osteoarthritis, unspecified site: Secondary | ICD-10-CM | POA: Insufficient documentation

## 2018-10-14 DIAGNOSIS — I10 Essential (primary) hypertension: Secondary | ICD-10-CM | POA: Insufficient documentation

## 2018-10-14 DIAGNOSIS — Z853 Personal history of malignant neoplasm of breast: Secondary | ICD-10-CM | POA: Diagnosis not present

## 2018-10-14 DIAGNOSIS — M359 Systemic involvement of connective tissue, unspecified: Secondary | ICD-10-CM | POA: Diagnosis not present

## 2018-10-14 DIAGNOSIS — K219 Gastro-esophageal reflux disease without esophagitis: Secondary | ICD-10-CM | POA: Diagnosis not present

## 2018-10-14 DIAGNOSIS — N1330 Unspecified hydronephrosis: Secondary | ICD-10-CM | POA: Insufficient documentation

## 2018-10-14 DIAGNOSIS — Z9012 Acquired absence of left breast and nipple: Secondary | ICD-10-CM | POA: Diagnosis not present

## 2018-10-14 DIAGNOSIS — Z7982 Long term (current) use of aspirin: Secondary | ICD-10-CM | POA: Insufficient documentation

## 2018-10-14 DIAGNOSIS — Z79891 Long term (current) use of opiate analgesic: Secondary | ICD-10-CM | POA: Insufficient documentation

## 2018-10-14 DIAGNOSIS — D4122 Neoplasm of uncertain behavior of left ureter: Secondary | ICD-10-CM

## 2018-10-14 DIAGNOSIS — C679 Malignant neoplasm of bladder, unspecified: Secondary | ICD-10-CM | POA: Diagnosis not present

## 2018-10-14 DIAGNOSIS — N3289 Other specified disorders of bladder: Secondary | ICD-10-CM

## 2018-10-14 HISTORY — PX: CYSTOSCOPY WITH URETEROSCOPY AND STENT PLACEMENT: SHX6377

## 2018-10-14 HISTORY — PX: URETERAL BIOPSY: SHX6688

## 2018-10-14 HISTORY — PX: HOLMIUM LASER APPLICATION: SHX5852

## 2018-10-14 SURGERY — CYSTOURETEROSCOPY, WITH STENT INSERTION
Anesthesia: General | Laterality: Left

## 2018-10-14 MED ORDER — PROPOFOL 10 MG/ML IV BOLUS
INTRAVENOUS | Status: DC | PRN
  Administered 2018-10-14: 150 mg via INTRAVENOUS

## 2018-10-14 MED ORDER — FAMOTIDINE 20 MG PO TABS
ORAL_TABLET | ORAL | Status: AC
  Administered 2018-10-14: 20 mg via ORAL
  Filled 2018-10-14: qty 1

## 2018-10-14 MED ORDER — OXYCODONE HCL 5 MG/5ML PO SOLN: 5.0000 mg | Freq: Once | ORAL | Status: DC | PRN

## 2018-10-14 MED ORDER — HYDROCODONE-ACETAMINOPHEN 5-325 MG PO TABS: 1.0000 | ORAL_TABLET | Freq: Four times a day (QID) | ORAL | 0 refills | Status: DC | PRN

## 2018-10-14 MED ORDER — SUGAMMADEX SODIUM 200 MG/2ML IV SOLN
INTRAVENOUS | Status: DC | PRN
  Administered 2018-10-14: 200 mg via INTRAVENOUS

## 2018-10-14 MED ORDER — FENTANYL CITRATE (PF) 100 MCG/2ML IJ SOLN
INTRAMUSCULAR | Status: AC
  Administered 2018-10-14: 25 ug via INTRAVENOUS
  Filled 2018-10-14: qty 2

## 2018-10-14 MED ORDER — ROCURONIUM BROMIDE 100 MG/10ML IV SOLN
INTRAVENOUS | Status: DC | PRN
  Administered 2018-10-14: 20 mg via INTRAVENOUS
  Administered 2018-10-14: 10 mg via INTRAVENOUS

## 2018-10-14 MED ORDER — OXYCODONE HCL 5 MG PO TABS: 5.0000 mg | ORAL_TABLET | Freq: Once | ORAL | Status: DC | PRN

## 2018-10-14 MED ORDER — LACTATED RINGERS IV SOLN
INTRAVENOUS | Status: DC
  Administered 2018-10-14: 09:00:00 via INTRAVENOUS

## 2018-10-14 MED ORDER — DEXAMETHASONE SODIUM PHOSPHATE 10 MG/ML IJ SOLN
INTRAMUSCULAR | Status: DC | PRN
  Administered 2018-10-14: 10 mg via INTRAVENOUS

## 2018-10-14 MED ORDER — CIPROFLOXACIN IN D5W 400 MG/200ML IV SOLN
INTRAVENOUS | Status: AC
  Filled 2018-10-14: qty 200

## 2018-10-14 MED ORDER — EPHEDRINE SULFATE 50 MG/ML IJ SOLN
INTRAMUSCULAR | Status: DC | PRN
  Administered 2018-10-14 (×2): 10 mg via INTRAVENOUS

## 2018-10-14 MED ORDER — PROPOFOL 10 MG/ML IV BOLUS
INTRAVENOUS | Status: AC
  Filled 2018-10-14: qty 20

## 2018-10-14 MED ORDER — LIDOCAINE HCL (CARDIAC) PF 100 MG/5ML IV SOSY
PREFILLED_SYRINGE | INTRAVENOUS | Status: DC | PRN
  Administered 2018-10-14: 100 mg via INTRAVENOUS

## 2018-10-14 MED ORDER — FENTANYL CITRATE (PF) 100 MCG/2ML IJ SOLN
25.0000 ug | INTRAMUSCULAR | Status: DC | PRN
  Administered 2018-10-14 (×2): 25 ug via INTRAVENOUS

## 2018-10-14 MED ORDER — SUCCINYLCHOLINE CHLORIDE 20 MG/ML IJ SOLN
INTRAMUSCULAR | Status: DC | PRN
  Administered 2018-10-14: 200 mg via INTRAVENOUS

## 2018-10-14 MED ORDER — FENTANYL CITRATE (PF) 100 MCG/2ML IJ SOLN
INTRAMUSCULAR | Status: AC
  Filled 2018-10-14: qty 2

## 2018-10-14 MED ORDER — PHENYLEPHRINE HCL (PRESSORS) 10 MG/ML IV SOLN
INTRAVENOUS | Status: DC | PRN
  Administered 2018-10-14 (×2): 100 ug via INTRAVENOUS

## 2018-10-14 MED ORDER — FAMOTIDINE 20 MG PO TABS
20.0000 mg | ORAL_TABLET | Freq: Once | ORAL | Status: AC
  Administered 2018-10-14: 20 mg via ORAL

## 2018-10-14 MED ORDER — FENTANYL CITRATE (PF) 100 MCG/2ML IJ SOLN
INTRAMUSCULAR | Status: DC | PRN
  Administered 2018-10-14: 50 ug via INTRAVENOUS

## 2018-10-14 MED ORDER — TAMSULOSIN HCL 0.4 MG PO CAPS: 0.4000 mg | ORAL_CAPSULE | Freq: Every day | ORAL | 0 refills | Status: DC

## 2018-10-14 SURGICAL SUPPLY — 25 items
BAG DRAIN CYSTO-URO LG1000N (MISCELLANEOUS) ×3 IMPLANT
BRUSH SCRUB EZ  4% CHG (MISCELLANEOUS) ×2
BRUSH SCRUB EZ 4% CHG (MISCELLANEOUS) ×1 IMPLANT
COVER WAND RF STERILE (DRAPES) ×3 IMPLANT
DRSG TELFA 4X3 1S NADH ST (GAUZE/BANDAGES/DRESSINGS) ×1 IMPLANT
ELECT REM PT RETURN 9FT ADLT (ELECTROSURGICAL) ×3
ELECTRODE REM PT RTRN 9FT ADLT (ELECTROSURGICAL) IMPLANT
FIBER LASER TRAC TIP (UROLOGICAL SUPPLIES) ×2 IMPLANT
FORCEPS BIOP PIRANHA Y (CUTTING FORCEPS) ×2 IMPLANT
GLOVE BIO SURGEON STRL SZ 6.5 (GLOVE) ×2 IMPLANT
GLOVE BIO SURGEONS STRL SZ 6.5 (GLOVE) ×1
GOWN STRL REUS W/ TWL LRG LVL3 (GOWN DISPOSABLE) ×2 IMPLANT
GOWN STRL REUS W/TWL LRG LVL3 (GOWN DISPOSABLE) ×6
GUIDEWIRE GREEN .038 145CM (MISCELLANEOUS) ×2 IMPLANT
GUIDEWIRE STR DUAL SENSOR (WIRE) ×5 IMPLANT
INFUSOR MANOMETER BAG 3000ML (MISCELLANEOUS) ×3 IMPLANT
KIT TURNOVER CYSTO (KITS) ×3 IMPLANT
NDL SAFETY ECLIPSE 18X1.5 (NEEDLE) ×1 IMPLANT
NEEDLE HYPO 18GX1.5 SHARP (NEEDLE) ×3
PACK CYSTO AR (MISCELLANEOUS) ×3 IMPLANT
SET CYSTO W/LG BORE CLAMP LF (SET/KITS/TRAYS/PACK) ×3 IMPLANT
STENT URO INLAY 6FRX24CM (STENTS) ×2 IMPLANT
SURGILUBE 2OZ TUBE FLIPTOP (MISCELLANEOUS) ×3 IMPLANT
WATER STERILE IRR 1000ML POUR (IV SOLUTION) ×3 IMPLANT
WATER STERILE IRR 3000ML UROMA (IV SOLUTION) ×3 IMPLANT

## 2018-10-14 NOTE — Anesthesia Post-op Follow-up Note (Signed)
Anesthesia QCDR form completed.        

## 2018-10-14 NOTE — Transfer of Care (Signed)
Immediate Anesthesia Transfer of Care Note  [name]  Procedure(s) Performed: CYSTOSCOPY WITH URETEROSCOPY AND STENT PLACEMENT (Left ) HOLMIUM LASER APPLICATION (Left ) URETERAL BIOPSY (Left )  Patient Location: PACU  Anesthesia Type:General  Level of Consciousness: sedated  Airway & Oxygen Therapy: Patient Spontanous Breathing and Patient connected to face mask oxygen  Post-op Assessment: Report given to RN and Post -op Vital signs reviewed and stable  Post vital signs: Reviewed and stable  Last Vitals:  Vitals Value Taken Time  BP 136/53 10/14/18 1131  Temp 36.9 C 10/14/18 1130  Pulse 73 10/14/18 1136  Resp 20 10/14/18 1136  SpO2 97 % 10/14/18 1136  Vitals shown include unvalidated device data.  Last Pain:  Vitals:   10/14/18 1130  TempSrc:   PainSc: Asleep         Complications: No apparent anesthesia complications

## 2018-10-14 NOTE — Telephone Encounter (Signed)
-----   Message from [name]., MD sent at 10/09/2018  6:26 PM EDT ----- Labs ok

## 2018-10-14 NOTE — Telephone Encounter (Signed)
Left message to call back regarding lab results.

## 2018-10-14 NOTE — Telephone Encounter (Signed)
Husband asks for an alternative medication to tamsulosin. She develops a rash with sulfa drugs and the pharmacist told them it could cause the same reaction. Please advise.

## 2018-10-14 NOTE — Anesthesia Procedure Notes (Signed)
Procedure Name: Intubation Date/Time: 10/14/2018 9:55 AM Performed by: Philbert Riser, CRNA Pre-anesthesia Checklist: Patient identified, Emergency Drugs available, Suction available, Patient being monitored and Timeout performed Patient Re-evaluated:Patient Re-evaluated prior to induction Oxygen Delivery Method: Circle system utilized and Simple face mask Preoxygenation: Pre-oxygenation with 100% oxygen Induction Type: IV induction Ventilation: Mask ventilation without difficulty Laryngoscope Size: McGraph and 3 Grade View: Grade II Tube type: Oral Tube size: 7.0 mm Number of attempts: 1 Airway Equipment and Method: Stylet Placement Confirmation: ETT inserted through vocal cords under direct vision,  positive ETCO2 and breath sounds checked- equal and bilateral Secured at: 20 cm Tube secured with: Tape Dental Injury: Teeth and Oropharynx as per pre-operative assessment

## 2018-10-14 NOTE — Discharge Instructions (Signed)
Transurethral Resection of Bladder Tumor (TURBT) or Bladder Biopsy ° ° °Definition: ° Transurethral Resection of the Bladder Tumor is a surgical procedure used to diagnose and remove tumors within the bladder. TURBT is the most common treatment for early stage bladder cancer. ° °General instructions: °   ° Your recent bladder surgery requires very little post hospital care but some definite precautions. ° °Despite the fact that no skin incisions were used, the area around the bladder incisions are raw and covered with scabs to promote healing and prevent bleeding. Certain precautions are needed to insure that the scabs are not disturbed over the next 2-4 weeks while the healing proceeds. ° °Because the raw surface inside your bladder and the irritating effects of urine you may expect frequency of urination and/or urgency (a stronger desire to urinate) and perhaps even getting up at night more often. This will usually resolve or improve slowly over the healing period. You may see some blood in your urine over the first 6 weeks. Do not be alarmed, even if the urine was clear for a while. Get off your feet and drink lots of fluids until clearing occurs. If you start to pass clots or don't improve call us. ° °Diet: ° °You may return to your normal diet immediately. Because of the raw surface of your bladder, alcohol, spicy foods, foods high in acid and drinks with caffeine may cause irritation or frequency and should be used in moderation. To keep your urine flowing freely and avoid constipation, drink plenty of fluids during the day (8-10 glasses). Tip: Avoid cranberry juice because it is very acidic. ° °Activity: ° °Your physical activity doesn't need to be restricted. However, if you are very active, you may see some blood in the urine. We suggest that you reduce your activity under the circumstances until the bleeding has stopped. ° °Bowels: ° °It is important to keep your bowels regular during the postoperative  period. Straining with bowel movements can cause bleeding. A bowel movement every other day is reasonable. Use a mild laxative if needed, such as milk of magnesia 2-3 tablespoons, or 2 Dulcolax tablets. Call if you continue to have problems. If you had been taking narcotics for pain, before, during or after your surgery, you may be constipated. Take a laxative if necessary. ° ° ° °Medication: ° °You should resume your pre-surgery medications unless told not to. In addition you may be given an antibiotic to prevent or treat infection. Antibiotics are not always necessary. All medication should be taken as prescribed until the bottles are finished unless you are having an unusual reaction to one of the drugs. ° ° °[facility] °[place], [place] [address] °[phone] ° ° ° °AMBULATORY SURGERY  °DISCHARGE INSTRUCTIONS ° ° °1) The drugs that you were given will stay in your system until tomorrow so for the next 24 hours you should not: ° °A) Drive an automobile °B) Make any legal decisions °C) Drink any alcoholic beverage ° ° °2) You may resume regular meals tomorrow.  Today it is better to start with liquids and gradually work up to solid foods. ° °You may eat anything you prefer, but it is better to start with liquids, then soup and crackers, and gradually work up to solid foods. ° ° °3) Please notify your doctor immediately if you have any unusual bleeding, trouble breathing, redness and pain at the surgery site, drainage, fever, or pain not relieved by medication. ° ° ° °4) Additional Instructions: ° ° ° ° ° ° ° °  Please contact your physician with any problems or Same Day Surgery at [phone], Monday through Friday 6 am to 4 pm, or [place] at  Main number at [phone]. ° °

## 2018-10-14 NOTE — Op Note (Signed)
Date of procedure: 10/14/18  Preoperative diagnosis:  1. Left hydronephrosis 2. Gross hematuria  Postoperative diagnosis:  1. Same as above 2. Left ureteral tumor 3. Bladder lesion  Procedure: 1. Left retrograde pyelogram 2. Left ureteroscopy 3. Left ureteral biopsy 4. Fulguration of left distal ureteral tumor 5. Left ureteral stent placement 6. Bladder biopsy  Surgeon: Hollice Espy, MD  Anesthesia: General  Complications: None  Intraoperative findings: Filling defect within the left distal ureter with mild hydroureteronephrosis, approximately 1 cm with apple core-like appearance near the level of the iliacs.  Bullous edema adjacent to left UO but does not appear to be involving UO itself.  EBL: Minimal  Specimens: Left ureteral tumor, bladder biopsy  Drains: 6 x 24 French double-J ureteral stent on left, Bard optima  Indication: Elizabeth Trujillo is a 83 y.o. patient with personal history of gross hematuria who underwent negative work-up last year.  In the interim, she is continued to have episodes of gross hematuria and ultimately was found to have new left hydroureteronephrosis concerning for possible ureteral lesion.  After reviewing the management options for treatment, he elected to proceed with the above surgical procedure(s). We have discussed the potential benefits and risks of the procedure, side effects of the proposed treatment, the likelihood of the patient achieving the goals of the procedure, and any potential problems that might occur during the procedure or recuperation. Informed consent has been obtained.  Description of procedure:  The patient was taken to the operating room and general anesthesia was induced.  The patient was placed in the dorsal lithotomy position, prepped and draped in the usual sterile fashion, and preoperative antibiotics were administered. A preoperative time-out was performed.   21 [place] scope was advanced per urethra into the  bladder.  Notably, she has fairly significant prolapse thus the bladder was reduced with vaginal pressure.  This allowed for visualization of the trigone more easily.  There is some bullous edema adjacent to the left UO with somewhat irregular surface but no obvious papillary lesion.  Almost an infiltrative appearance.  Measured no more than 1 cm.  Did not appear to involve the left UO.  The remainder the bladder was completely unremarkable with out lesions tumors, or ulcerations.  Attention was turned to the left ureteral orifice which was cannulated using a 5 [place] open-ended ureteral catheter.  A gentle retrograde pyelogram was performed on this side which revealed mild to moderate left hydroureteronephrosis down to the level of the iliacs where a small apple core like stricture/filling defect was appreciated.  There also appeared to be another small lesion just proximal to this, less than 0.5 cm.  There were no other obvious filling defects throughout the remainder of the collecting system.  A wire was then placed up to level of the kidney under fluoroscopic guidance a stop in place as a safety wire.  Of 1.9 French semirigid ureteroscope was then advanced alongside of the ureter into the distal portion near the level of the iliacs where there was a circumferential area of narrowing with papillary change with some adherent small calcification.  This is likely the left the location of the apple core-like appearance on retrograde pyelogram.  I was able to pass the scope beyond this area fairly easily and there was a small more pedunculated lesion on the medial ureteral wall just proximal to this.  The scope was unable to be advanced up to the level of the proximal ureter no additional lesions were identified.  Next, Bo Mcclintock  forceps were used to completely resect the more pedunculated smaller lesion and one single biopsy specimen.  Then use the Bo Mcclintock to biopsy the circumferential lesion with more difficulty given  the location of this lesion.  It appeared to be possibly high-grade for the more pedunculated lesion.  These were passed off the field as left ureteral biopsy.  Next, a Super Stiff wire was advanced up to the level of the kidney under direct visualization.  A single channel 7 [place] Wolf flexible ureteroscope was then advanced over the Super Stiff wire as a working wire up to the level of the kidney under fluoroscopic guidance which went easily.  The upper tract was completely visualized using a retrograde pyelogram as a roadmap and no additional upper tract lesions were identified.  The scope was then backed in length the ureter inspecting along the way.  Finally, upon entering the portion of the ureter which have been previously biopsied, 200 m laser fiber was brought in and using the settings of 0.4 J and 20 Hz, the tumor bed of the more proximal lesion was fulgurated as well as circumferentially to ablate the surface of the apple core lesion until no gross tumor was visible.  I performed a last retrograde pyelogram which showed some mild improvement of the upper cordlike appearance.  There is no extravasation or ureteral injury appreciated.  I then exchanged back to the semirigid scope and further inspected and fulgurate 1 additional area which appeared to be consistent with viable tumor.  Finally, the scope was removed.  Next, cold cup biopsy forceps were used to biopsy the area was around the left ureteral orifice which were somewhat suspicious in appearance.  Several biopsies were taken and then Bugbee electrocautery was used to fulgurate the areas.  Care was taken to avoid any fulguration in around the left UO.  Finally, a 6 x 24 French Bard optima ureteral stent was advanced over the safety wire up to level the kidney.  The wires partially drawn till full coils noted both within the bladder as well as within the renal pelvis.  The bladder was drained.  Hemostasis was excellent.  The patient was then  cleaned and dried, repositioned in supine position, reversed anesthesia, and taken the PACU in stable condition.  Plan: I will see the patient next week in clinic to discuss her pathology results.  Follow-up will be determined based on these.  All questions answered.  She may resume her antiplatelet therapy this evening.  Hollice Espy, M.D.

## 2018-10-14 NOTE — Telephone Encounter (Signed)
This is not a medication she absolutely needs to take.  All does help her ureter relax and prevent spasms.  There is extremely low cross-reactivity between the 2 medications and that is more theoretical than actual.  This is something I use with patients who have sulfa allergies all the time without issues.  Hollice Espy, MD

## 2018-10-14 NOTE — H&P (Signed)
08/28/2018 -> updated today 10/14/18 In the interim, series of TIAs cleared to neurology to hold antiplatelet preop Will resume ASAP post op RRR CTAB Preop Urine culture negative   Elizabeth Trujillo [birth year] [id number]  Referring provider: [name]., MD [address] [place],  [place] [address]     Chief Complaint  Patient presents with  . Hematuria    HPI: 83 year old female with a personal history of recurrent painless gross hematuria who returns today with concerning imaging findings.  She initially underwent extensive gross hematuria evaluation in 2019 including CT urogram and cystoscopy.  This revealed no significant pathology.  In the interim, she continues to have intermittent episodes of hematuria without a clear etiology.  More recently, she returned to the office on 05/2018 for repeat cystoscopy again with no significant findings.  A renal ultrasound was ordered but scheduled in August for unclear reasons.  In the interim, her primary care physician ordered an abdominal ultrasound which showed new mild to moderate left hydronephrosis not previously appreciated.  This was followed up with a CT abdomen pelvis on 08/01/2018 which showed similar hydroureteronephrosis down to the level of the distal ureter without an obstructing stone.  Was described as "only slightly prominent" within the last several centimeters of the ureter.  She denies any significant bladder pain.  She does continue to have intermittent episodes of clitoral pain of unclear etiology.  She underwent extensive gynecological evaluation for this and no underlying etiology was identified.  Company today by her husband.   PMH:     Past Medical History:  Diagnosis Date  . Arthritis   . Breast cancer ([facility]) 1986   left mastectomy  . Cancer ([place]) 1986   left breast  . Collagen vascular disease ([place])    RA   . Elevated triglycerides with high cholesterol   . GERD  (gastroesophageal reflux disease)    history of reflux  . Hypercholesterolemia   . Hypertension     Surgical History:      Past Surgical History:  Procedure Laterality Date  . ABDOMINAL HYSTERECTOMY  [id number]   TAH/BSO  . BREAST BIOPSY Right 01/23/2013   [place] bx/clip-neg  . BREAST SURGERY Left 07/1984   Mastectomy  . callus removed from left toe    . CATARACT EXTRACTION W/ INTRAOCULAR LENS IMPLANT Left 2015  . EYE SURGERY Left 2013  . FOOT SURGERY Left 04/2013  . KNEE ARTHROPLASTY Right 11/03/2015   Procedure: COMPUTER ASSISTED TOTAL KNEE ARTHROPLASTY;  Surgeon: Dereck Leep, MD;  Location: [facility] ORS;  Service: Orthopedics;  Laterality: Right;  . MASTECTOMY Left 1986   positive    Home Medications:       Allergies as of 08/28/2018      Reactions   Penicillin V    Penicillin V Potassium Hives   Has patient had a PCN reaction causing immediate rash, facial/tongue/throat swelling, SOB or lightheadedness with hypotension: no Has patient had a PCN reaction causing severe rash involving mucus membranes or skin necrosis: {no Has patient had a PCN reaction that required hospitalization no Has patient had a PCN reaction occurring within the last 10 years: no If all of the above answers are "NO", then may proceed with Cephalosporin use.   Sulfa Antibiotics Hives         Medication List       Accurate as of August 28, 2018 11:59 PM. If you have any questions, ask your nurse or doctor.  aspirin 81 MG tablet Take 81 mg by mouth daily.   Cholecalciferol 25 MCG (1000 UT) capsule Take 1,000 Units by mouth at bedtime.   gabapentin 100 MG capsule Commonly known as: NEURONTIN Take 1 capsule (100 mg total) by mouth 2 (two) times daily.   ketoconazole 2 % shampoo Commonly known as: NIZORAL SHAMPOO LEAVE ON FOR FIVE MINUTES THEN RINSE DAILY UNTIL CLEAR THEN WEEKLY FOR MAINTENANCE   meloxicam 15 MG tablet Commonly known as: MOBIC Take 15 mg by  mouth daily.   MULTIPLE VITAMIN PO Take 1 tablet by mouth daily.   nystatin cream Commonly known as: MYCOSTATIN Apply 1 application topically 2 (two) times daily.   OMEGA-3 FATTY ACIDS PO Take 1 capsule by mouth daily.   rosuvastatin 20 MG tablet Commonly known as: CRESTOR Take 0.5 tablets (10 mg total) by mouth daily.   traMADol 50 MG tablet Commonly known as: ULTRAM Take 1 tablet (50 mg total) by mouth 2 (two) times daily.       Allergies:       Allergies  Allergen Reactions  . Penicillin V   . Penicillin V Potassium Hives    Has patient had a PCN reaction causing immediate rash, facial/tongue/throat swelling, SOB or lightheadedness with hypotension: no Has patient had a PCN reaction causing severe rash involving mucus membranes or skin necrosis: {no Has patient had a PCN reaction that required hospitalization no Has patient had a PCN reaction occurring within the last 10 years: no If all of the above answers are "NO", then may proceed with Cephalosporin use.  . Sulfa Antibiotics Hives    Family History:      Family History  Problem Relation Age of Onset  . Cancer Mother        baldder and breast  . Glaucoma Mother   . Kidney disease Mother   . Arthritis Mother   . Stroke Mother   . Breast cancer Mother 2  . Heart disease Father   . Cancer Brother        lung cancer, smoker  . Breast cancer Brother   . Breast cancer Cousin        3 mat cousins    Social History:  reports that she has never smoked. She has never used smokeless tobacco. She reports that she does not drink alcohol or use drugs.  ROS: UROLOGY Frequent Urination?: No Hard to postpone urination?: No Burning/pain with urination?: No Get up at night to urinate?: No Leakage of urine?: No Urine stream starts and stops?: No Trouble starting stream?: No Do you have to strain to urinate?: No Blood in urine?: Yes Urinary tract infection?: No Sexually transmitted  disease?: No Injury to kidneys or bladder?: No Painful intercourse?: No Weak stream?: No Currently pregnant?: No Vaginal bleeding?: No Last menstrual period?: n  Gastrointestinal Nausea?: No Vomiting?: No Indigestion/heartburn?: No Diarrhea?: No Constipation?: No  Constitutional Fever: No Night sweats?: No Weight loss?: No Fatigue?: No  Skin Skin rash/lesions?: No Itching?: No  Eyes Blurred vision?: No Double vision?: No  Ears/Nose/Throat Sore throat?: No Sinus problems?: No  Hematologic/Lymphatic Swollen glands?: No Easy bruising?: No  Cardiovascular Leg swelling?: No Chest pain?: No  Respiratory Cough?: No Shortness of breath?: No  Endocrine Excessive thirst?: No  Musculoskeletal Back pain?: No Joint pain?: No  Neurological Headaches?: No Dizziness?: No  Psychologic Depression?: No Anxiety?: No  Physical Exam: BP 130/84   Pulse 80   Ht 5\' 4"  (1.626 m)   Wt 215 lb (  97.5 kg)   BMI 36.90 kg/m   Constitutional:  Alert and oriented, No acute distress. HEENT: [place] AT, moist mucus membranes.  Trachea midline, no masses. Cardiovascular: No clubbing, cyanosis, or edema. Respiratory: Normal respiratory effort, no increased work of breathing. Skin: No rashes, bruises or suspicious lesions. Neurologic: Grossly intact, no focal deficits, moving all 4 extremities. Psychiatric: Normal mood and affect.  Laboratory Data: Recent Labs       Lab Results  Component Value Date   WBC 10.3 07/25/2018   HGB 13.9 07/25/2018   HCT 42.2 07/25/2018   MCV 93.6 07/25/2018   PLT 255 07/25/2018      Recent Labs       Lab Results  Component Value Date   CREATININE 0.65 07/25/2018     Recent Labs       Lab Results  Component Value Date   HGBA1C 5.8 (H) 07/25/2018      Urinalysis      Results for orders placed or performed in visit on 08/28/18  Urine culture   Specimen: Urine   UR  Result Value Ref Range   Urine  Culture, Routine Final report    Organism ID, Bacteria Comment   Microscopic Examination   URINE  Result Value Ref Range   WBC, UA 11-30 (A) 0 - 5 /hpf   RBC >30 (A) 0 - 2 /hpf   Epithelial Cells (non renal) 0-10 0 - 10 /hpf   Bacteria, UA Moderate (A) None seen/Few  Urinalysis, Complete  Result Value Ref Range   Specific Gravity, UA 1.020 1.005 - 1.030   pH, UA 6.5 5.0 - 7.5   Color, UA Yellow Yellow   Appearance Ur Cloudy (A) Clear   Leukocytes,UA 2+ (A) Negative   Protein,UA 1+ (A) Negative/Trace   Glucose, UA Negative Negative   Ketones, UA Negative Negative   RBC, UA 3+ (A) Negative   Bilirubin, UA Negative Negative   Urobilinogen, Ur 0.2 0.2 - 1.0 mg/dL   Nitrite, UA Negative Negative   Microscopic Examination See below:      Pertinent Imaging: CT abdomen pelvis with contrast on 08/01/2018 was personally reviewed.  Agree with radiologic interpretation,  Abdominal ultrasound was also reviewed.  These are also directly compared to CT hematuria work-up from 04/2017 which currently indicate this is a new finding.  Assessment & Plan:    1. Gross hematuria Possibly secondary to #2 See below discussion - Urinalysis, Complete - Urine culture  2. Hydronephrosis of left kidney New finding of left hydroureteronephrosis down to the distal ureter, concerning for possible underlying malignancy amongst others in differential diagnosis.  This was discussed with the patient and her husband today extensively.  Actually recommend proceeding to the operating room for cystoscopy, bilateral retrograde pyelogram, diagnostic left ureteroscopy with possible biopsy, possible ablation and ureteral stent placement.  Both she and her husband are agreeable this plan.  Risk of surgery were discussed in detail including risk of bleeding, infection,, stent pain, need for further procedures amongst others.  All questions were answered. - Urine culture  Hollice Espy, MD  [facility] [address], [place], [place] [address] [phone]  I spent 25 min with this patient of which greater than 50% was spent in counseling and coordination of care with the patient.

## 2018-10-14 NOTE — Anesthesia Preprocedure Evaluation (Addendum)
Anesthesia Evaluation  Patient identified by MRN, date of birth, ID band Patient awake    Reviewed: Allergy & Precautions, H&P , NPO status , Patient's Chart, lab work & pertinent test results  History of Anesthesia Complications Negative for: history of anesthetic complications  Airway Mallampati: III  TM Distance: <3 FB Neck ROM: limited    Dental  (+) Chipped   Pulmonary neg pulmonary ROS, neg shortness of breath,           Cardiovascular Exercise Tolerance: Good hypertension, (-) angina(-) Past MI and (-) DOE      Neuro/Psych PSYCHIATRIC DISORDERS TIACVA negative psych ROS   GI/Hepatic Neg liver ROS, GERD  Medicated and Controlled,  Endo/Other  negative endocrine ROS  Renal/GU      Musculoskeletal   Abdominal   Peds  Hematology negative hematology ROS (+)   Anesthesia Other Findings Past Medical History: No date: Arthritis 1986: Breast cancer ([place])     Comment:  left mastectomy 1986: Cancer ([place])     Comment:  left breast No date: Collagen vascular disease ([facility])     Comment:  RA  No date: Elevated triglycerides with high cholesterol No date: GERD (gastroesophageal reflux disease)     Comment:  history of reflux No date: Hypercholesterolemia No date: Hypertension No date: Stroke [facility])     Comment:  TIA  Past Surgical History: [id number]: ABDOMINAL HYSTERECTOMY     Comment:  TAH/BSO 01/23/2013: BREAST BIOPSY; Right     Comment:  [place] bx/clip-neg 07/1984: BREAST SURGERY; Left     Comment:  Mastectomy No date: callus removed from left toe 2015: CATARACT EXTRACTION W/ INTRAOCULAR LENS IMPLANT; Left 2013: EYE SURGERY; Left 04/2013: FOOT SURGERY; Left 11/03/2015: KNEE ARTHROPLASTY; Right     Comment:  Procedure: COMPUTER ASSISTED TOTAL KNEE ARTHROPLASTY;                Surgeon: Dereck Leep, MD;  Location: [facility] ORS;                Service: Orthopedics;  Laterality: Right; 1986: MASTECTOMY; Left  Comment:  positive  BMI    Body Mass Index: 37.49 kg/m      Reproductive/Obstetrics negative OB ROS                             Anesthesia Physical Anesthesia Plan  ASA: III  Anesthesia Plan: General ETT   Post-op Pain Management:    Induction: Intravenous  PONV Risk Score and Plan: Ondansetron, Dexamethasone, Midazolam and Treatment may vary due to age or medical condition  Airway Management Planned: Oral ETT and Video Laryngoscope Planned  Additional Equipment:   Intra-op Plan:   Post-operative Plan: Extubation in OR  Informed Consent: I have reviewed the patients History and Physical, chart, labs and discussed the procedure including the risks, benefits and alternatives for the proposed anesthesia with the patient or authorized representative who has indicated his/her understanding and acceptance.     Dental Advisory Given  Plan Discussed with: Anesthesiologist, CRNA and Surgeon  Anesthesia Plan Comments: (Patient has clearance from Dr. Manuella Ghazi to stop taking her anti coagulation for this procedure.  Patient consented that with her history she may be higher risk for stroke and she voiced understanding.  Patient consented for risks of anesthesia including but not limited to:  - adverse reactions to medications - damage to teeth, lips or other oral mucosa - sore throat or hoarseness - Damage to heart, brain,  lungs or loss of life  Patient voiced understanding.)       Anesthesia Quick Evaluation

## 2018-10-14 NOTE — Progress Notes (Signed)
Ch spoke w/ pt in pre-op before being consulted by surgeon. Pt presented to hv a positive affect and was alert. Pt shared that she has been dealing with chronic pain for a while in the area. The pt is concerned that there may be blockage but hopeful that it is not cancerous. Ch provided words of encouragement and prayed for the pt to be at peace and calm. Pt appreciated visit.  No further needs at this time.    10/14/18 0900  Clinical Encounter Type  Visited With Patient  Visit Type Psychological support;Spiritual support;Social support;Pre-op  Spiritual Encounters  Spiritual Needs Emotional;Grief support  Stress Factors  Patient Stress Factors Health changes  Family Stress Factors None identified

## 2018-10-15 NOTE — Telephone Encounter (Signed)
Made appointment and cancelled ultrasound. Patient aware.

## 2018-10-15 NOTE — Telephone Encounter (Signed)
Advised patient of Dr Cherrie Gauze note below regarding tamsulosin.  Patient has a renal ultrasound scheduled. This may have been made before surgery. Does she still need to have the ultrasound?  Patient needs a follow up appointment. When can she be doublebooked for this?

## 2018-10-15 NOTE — Telephone Encounter (Signed)
No she does not need a renal ultrasound.  Elizabeth Trujillo prefers [facility] I believe, I can see her at 4:15 next Friday.  Hollice Espy, MD

## 2018-10-16 ENCOUNTER — Other Ambulatory Visit: Payer: Self-pay | Admitting: Anatomic Pathology & Clinical Pathology

## 2018-10-16 LAB — SURGICAL PATHOLOGY

## 2018-10-17 ENCOUNTER — Telehealth: Payer: Self-pay

## 2018-10-17 NOTE — Telephone Encounter (Signed)
Patient notified of lab results

## 2018-10-22 NOTE — Anesthesia Postprocedure Evaluation (Signed)
Anesthesia Post Note  [name]  Procedure(s) Performed: CYSTOSCOPY WITH URETEROSCOPY AND STENT PLACEMENT (Left ) HOLMIUM LASER APPLICATION (Left ) URETERAL BIOPSY (Left )  Patient location during evaluation: PACU Anesthesia Type: General Level of consciousness: awake and alert Pain management: pain level controlled Vital Signs Assessment: post-procedure vital signs reviewed and stable Respiratory status: spontaneous breathing, nonlabored ventilation, respiratory function stable and patient connected to nasal cannula oxygen Cardiovascular status: blood pressure returned to baseline and stable Postop Assessment: no apparent nausea or vomiting Anesthetic complications: no     Last Vitals:  Vitals:   10/14/18 1237 10/14/18 1316  BP: 132/71 127/68  Pulse: 79 73  Resp: 16 14  Temp: 36.6 C 36.7 C  SpO2: 99% 98%    Last Pain:  Vitals:   10/15/18 0829  TempSrc:   PainSc: 0-No pain                 Molli Barrows

## 2018-10-24 ENCOUNTER — Other Ambulatory Visit: Payer: Medicare Other

## 2018-10-24 NOTE — Progress Notes (Signed)
Tumor Board Documentation  ALEXANDRIA SHIFLETT was presented by Dr Erlene Quan at our Tumor Board on 10/24/2018, which included representatives from medical oncology, surgical, radiology, pathology, navigation, research, palliative care, pharmacy, internal medicine.  Lygia currently presents as an external consult, for [place], for new positive pathology with history of the following treatments: surgical intervention(s).  Additionally, we reviewed previous medical and familial history, history of present illness, and recent lab results along with all available histopathologic and imaging studies. The tumor board considered available treatment options and made the following recommendations: Active surveillance Second look surgery in 2 months, possibly refer to outside institution, possible BCG therapy  The following procedures/referrals were also placed: No orders of the defined types were placed in this encounter.   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  Tumor board is a meeting of clinicians from various specialty areas who evaluate and discuss patients for whom a multidisciplinary approach is being considered. Final determinations in the plan of care are those of the provider(s). The responsibility for follow up of recommendations given during tumor board is that of the provider.   Today's extended care, comprehensive team conference, Bernestine was not present for the discussion and was not examined.   Multidisciplinary Tumor Board is a multidisciplinary case peer review process.  Decisions discussed in the Multidisciplinary Tumor Board reflect the opinions of the specialists present at the conference without having examined the patient.  Ultimately, treatment and diagnostic decisions rest with the primary  provider(s) and the patient.

## 2018-10-25 ENCOUNTER — Other Ambulatory Visit
Admission: RE | Admit: 2018-10-25 | Discharge: 2018-10-25 | Disposition: A | Discharge status: Home / Self Care | Payer: Medicare Other | Source: Ambulatory Visit | Attending: Urology | Admitting: Urology

## 2018-10-25 ENCOUNTER — Encounter: Payer: Self-pay | Admitting: Urology

## 2018-10-25 ENCOUNTER — Ambulatory Visit (INDEPENDENT_AMBULATORY_CARE_PROVIDER_SITE_OTHER): Payer: Medicare Other | Admitting: Urology

## 2018-10-25 VITALS — BP 129/77 | HR 76

## 2018-10-25 DIAGNOSIS — C679 Malignant neoplasm of bladder, unspecified: Secondary | ICD-10-CM | POA: Diagnosis not present

## 2018-10-25 DIAGNOSIS — N390 Urinary tract infection, site not specified: Secondary | ICD-10-CM

## 2018-10-25 DIAGNOSIS — N1330 Unspecified hydronephrosis: Secondary | ICD-10-CM | POA: Diagnosis not present

## 2018-10-25 LAB — URINALYSIS, COMPLETE (UACMP) WITH MICROSCOPIC
Bilirubin Urine: NEGATIVE
Glucose, UA: NEGATIVE mg/dL
Ketones, ur: NEGATIVE mg/dL
Nitrite: NEGATIVE
Protein, ur: >300 mg/dL — AB
RBC / HPF: >50 RBC/hpf (ref 0–5)
Specific Gravity, Urine: 1.025 (ref 1.005–1.030)
pH: 5.5 (ref 5.0–8.0)

## 2018-10-25 NOTE — Progress Notes (Signed)
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 ° °She initially presented with gross hematuria over a year ago at which time she underwent a negative hematuria work-up.  She continued of intermittent gross hematuria.  Ultimately, she had a noncontrast CT scan with new evidence of left hydronephrosis and thus was taken to the operating room on 10/14/2018 for diagnostic left ureteroscopy.  This showed a lesion in the left distal ureter which is somewhat pedunculated as well as a more circumferential lesion consistent with high-grade urothelial carcinoma, depth of invasion unknown.  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  The remainder of the upper tract was unremarkable.  The lesion was biopsied and fulgurated using the laser. ° °She has been tolerating her stent well.  She is some pressure and urgency frequency associated with it.  No fevers or chills. ° °She does remark today that her left vaginal/clitoral pain is significantly improved since stent placement/surgery.  She wonders if this may have been the etiology of her pain all along. ° °No known smoking history.  No environmental exposures. ° °Notably, she does have a personal history of breast cancer.  Mother also had bladder and breast cancer. ° °She has had a series of TIAs in the recent past and is undergoing further evaluation with neurology.  She is no longer on Plavix.  She is a lot of questions about this today and will be seeing Dr. Gilbert and her neurologist, Dr. Potter in the near future. ° ° °PMH: °Past Medical History:  °Diagnosis Date  °• Arthritis   °• Breast cancer (HCC) 1986  ° left  mastectomy  °• Cancer (HCC) 1986  ° left breast  °• Collagen vascular disease (HCC)   ° RA   °• Elevated triglycerides with high cholesterol   °• GERD (gastroesophageal reflux disease)   ° history of reflux  °• Hypercholesterolemia   °• Hypertension   °• Stroke (HCC)   ° TIA  ° ° °Surgical History: °Past Surgical History:  °Procedure Laterality Date  °• ABDOMINAL HYSTERECTOMY  071986  ° TAH/BSO  °• BREAST BIOPSY Right 01/23/2013  ° us bx/clip-neg  °• BREAST SURGERY Left 07/1984  ° Mastectomy  °• callus removed from left toe    °• CATARACT EXTRACTION W/ INTRAOCULAR LENS IMPLANT Left 2015  °• CYSTOSCOPY WITH URETEROSCOPY AND STENT PLACEMENT Left 10/14/2018  ° Procedure: CYSTOSCOPY WITH URETEROSCOPY AND STENT PLACEMENT;  Surgeon: Carlis Blanchard, MD;  Location: [facility];  Service: Urology;  Laterality: Left;  °• EYE SURGERY Left 2013  °• FOOT SURGERY Left 04/2013  °• HOLMIUM LASER APPLICATION Left 10/14/2018  ° Procedure: HOLMIUM LASER APPLICATION;  Surgeon: Jarely Juncaj, MD;  Location: [facility];  Service: Urology;  Laterality: Left;  °• KNEE ARTHROPLASTY Right 11/03/2015  ° Procedure: COMPUTER ASSISTED TOTAL KNEE ARTHROPLASTY;  Surgeon: James P Hooten, MD;  Location: [facility];  Service: Orthopedics;  Laterality: Right;  °• MASTECTOMY Left 1986  ° positive  °• URETERAL BIOPSY Left 10/14/2018  ° Procedure: URETERAL BIOPSY;  Surgeon: Tsering Leaman, MD;  Location: [facility];  Service: Urology;  Laterality: Left;  ° ° °Home Medications:  °  Allergies as of 10/25/2018   °   Reactions  ° Penicillin V Potassium Hives  ° Has patient had a PCN reaction causing immediate rash, facial/tongue/throat swelling, SOB or lightheadedness with hypotension: no °Has patient had a PCN reaction causing severe rash involving mucus membranes or skin necrosis: {no °Has patient had a PCN reaction that required hospitalization no °Has patient had a PCN reaction occurring within the last 10 years: no °If all of the above answers are "NO", then may  proceed with Cephalosporin use.  ° Sulfa Antibiotics Hives  °  °  °Medication List  °  °  ° Accurate as of October 25, 2018 11:59 PM. If you have any questions, ask your nurse or doctor.  °  °  °  °acetaminophen 500 MG tablet °Commonly known as: TYLENOL °Take 500-1,000 mg by mouth every 6 (six) hours as needed (pain.). °  °amLODipine 5 MG tablet °Commonly known as: NORVASC °Take 1 tablet (5 mg total) by mouth daily. °  °aspirin EC 81 MG tablet °Take 81 mg by mouth at bedtime. °  °atorvastatin 40 MG tablet °Commonly known as: Lipitor °Take 1 tablet (40 mg total) by mouth daily. °What changed: when to take this °  °CALCIUM+D3 PO °Take 1 tablet by mouth daily at 2 PM. °  °cholecalciferol 25 MCG (1000 UT) tablet °Commonly known as: VITAMIN D °Take 1,000 Units by mouth at bedtime. °  °clopidogrel 75 MG tablet °Commonly known as: Plavix °Take 1 tablet (75 mg total) by mouth daily. °What changed: when to take this °  °gabapentin 100 MG capsule °Commonly known as: NEURONTIN °Take 2 capsules (200 mg total) by mouth 2 (two) times daily. °  °HYDROcodone-acetaminophen 5-325 MG tablet °Commonly known as: NORCO/VICODIN °Take 1-2 tablets by mouth every 6 (six) hours as needed for moderate pain. °  °ketoconazole 2 % shampoo °Commonly known as: NIZORAL °Apply 1 application topically every three (3) days as needed (flaky/itchy scalp). °  °levETIRAcetam 250 MG tablet °Commonly known as: KEPPRA °Take 250 mg by mouth 2 (two) times daily. °  °multivitamin with minerals Tabs tablet °Take 1 tablet by mouth daily at 2 PM. °  °nystatin cream °Commonly known as: MYCOSTATIN °Apply 1 application topically 2 (two) times daily. °What changed:  °· when to take this °· reasons to take this °  °tamsulosin 0.4 MG Caps capsule °Commonly known as: Flomax °Take 1 capsule (0.4 mg total) by mouth daily. °  °traMADol 50 MG tablet °Commonly known as: ULTRAM °Take 1 tablet (50 mg total) by mouth 2 (two) times daily. °What changed:  °· when to take  this °· reasons to take this °  °  ° ° °Allergies:  °Allergies  °Allergen Reactions  °• Penicillin V Potassium Hives  °  Has patient had a PCN reaction causing immediate rash, facial/tongue/throat swelling, SOB or lightheadedness with hypotension: no °Has patient had a PCN reaction causing severe rash involving mucus membranes or skin necrosis: {no °Has patient had a PCN reaction that required hospitalization no °Has patient had a PCN reaction occurring within the last 10 years: no °If all of the above answers are "NO", then may proceed with Cephalosporin use.  °• Sulfa Antibiotics Hives  ° ° °Family History: °Family History  °Problem Relation Age of Onset  °• Cancer Mother   °     baldder and breast  °• Glaucoma Mother   °• Kidney disease Mother   °• Arthritis Mother   °• Stroke Mother   °•   Breast cancer Mother 78  °• Heart disease Father   °• Cancer Brother   °     lung cancer, smoker  °• Breast cancer Brother   °• Breast cancer Cousin   °     3 mat cousins  ° ° °Social History:  reports that she has never smoked. She has never used smokeless tobacco. She reports that she does not drink alcohol or use drugs. ° °ROS: °UROLOGY °Frequent Urination?: No °Hard to postpone urination?: No °Burning/pain with urination?: No °Get up at night to urinate?: No °Leakage of urine?: No °Urine stream starts and stops?: No °Trouble starting stream?: No °Do you have to strain to urinate?: No °Blood in urine?: No °Urinary tract infection?: No °Sexually transmitted disease?: No °Injury to kidneys or bladder?: No °Painful intercourse?: No °Weak stream?: No °Currently pregnant?: No °Vaginal bleeding?: No °Last menstrual period?: n ° °Gastrointestinal °Nausea?: No °Vomiting?: No °Indigestion/heartburn?: No °Diarrhea?: No °Constipation?: No ° °Constitutional °Fever: No °Night sweats?: No °Weight loss?: No °Fatigue?: No ° °Skin °Skin rash/lesions?: No °Itching?: No ° °Eyes °Blurred vision?: No °Double vision?: No ° °Ears/Nose/Throat °Sore  throat?: No °Sinus problems?: No ° °Hematologic/Lymphatic °Swollen glands?: No °Easy bruising?: No ° °Cardiovascular °Leg swelling?: No °Chest pain?: No ° °Respiratory °Cough?: No °Shortness of breath?: No ° °Endocrine °Excessive thirst?: No ° °Musculoskeletal °Back pain?: No °Joint pain?: No ° °Neurological °Headaches?: No °Dizziness?: No ° °Psychologic °Depression?: No °Anxiety?: No ° °Physical Exam: °BP 129/77    Pulse 76   °Constitutional:  Alert and oriented, No acute distress. Accompanied by her husband today as well as her daughter by face time. °HEENT: [place] AT, moist mucus membranes.  Trachea midline, no masses. °Cardiovascular: No clubbing, cyanosis, or edema. °Respiratory: Normal respiratory effort, no increased work of breathing. °Skin: No rashes, bruises or suspicious lesions. °Neurologic: Grossly intact, no focal deficits, moving all 4 extremities. °Psychiatric: Normal mood and affect. ° °Laboratory Data: °Lab Results  °Component Value Date  ° WBC 10.5 10/07/2018  ° HGB 14.6 10/07/2018  ° HCT 45.7 10/07/2018  ° MCV 94 10/07/2018  ° PLT 320 10/07/2018  ° ° °Lab Results  °Component Value Date  ° CREATININE 0.66 10/07/2018  ° ° ° °Lab Results  °Component Value Date  ° HGBA1C 6.1 (H) 09/09/2018  ° ° °Urinalysis °Results for orders placed or performed during the hospital encounter of 10/14/18  °Surgical pathology  °Result Value Ref Range  ° SURGICAL PATHOLOGY    °  Surgical Pathology °CASE: [id number] °[name] °Surgical Pathology Report ° ° ° ° °SPECIMEN SUBMITTED: °A. Ureter, left °B. Bladder; bx ° °CLINICAL HISTORY: °None provided ° °PRE-OPERATIVE DIAGNOSIS: °Left hydronephrosis ° °POST-OPERATIVE DIAGNOSIS: °Same as pre-op ° ° ° °DIAGNOSIS: °A.  URETER, LEFT DISTAL; BIOPSY: °- UROTHELIAL CARCINOMA, HIGH-GRADE (WHO/ISUP), DEFINITIVE INVASION NOT °IDENTIFIED. °- SCANT MUSCULARIS PROPRIA PRESENT, AND UNINVOLVED. ° °B.  URINARY BLADDER, LEFT URETERAL ORIFICE; TRANSURETHRAL BIOPSY: °- UROTHELIAL  CARCINOMA IN SITU. °- CYSTITIS CYSTICA. °- NEGATIVE FOR INVASIVE CARCINOMA. ° °GROSS DESCRIPTION: °A. Labeled: Left ureter °Received: In formalin °Tissue fragment(s): 7 °Size: From less than 0.1-0.1 cm °Description: Tonsil tissue fragment °Entirely submitted in 1 cassette. ° °B. Labeled: Bladder biopsy °Received: In formalin °Tissue fragment(s): 4 °Size: From 0.1-0.2 cm °Description: Soft tissue fragments °Entirely submitted in 1 cassette.  ° ° °Final Diagnosis performed by Heath Jones, MD.   Electronically signed °10/16/2018 1:28:03PM °The electronic signature indicates that the named Attending Pathologist °has evaluated the specimen ° Technical component performed   at [facility], [address], [place], °[place] [address] Lab: [phone] Dir: Sanjai Nagendra, MD, MMM  Professional °component performed at [facility], [facility], [address], [place], [place] [address] Lab: [phone] Dir: Tara C. °Rubinas, MD °  ° ° °Assessment & Plan:   ° °1. Hydronephrosis of left kidney °Secondary to obstructing tumor °Status post stent placement ° °2. Malignant neoplasm of urinary bladder, unspecified site (HCC) °Left upper tract urothelial carcinoma of the ureter, high-grade presumably noninvasive ° °Discussed the standard of care in this situation would be nephroureterectomy, however given her medical comorbidities age amongst others, we discussed alternative options including including more conservative treatment with second look ureteroscopy, additional fulguration of the lesion as needed for local control followed by BCG to the bladder as well as with a stent in place to allow for reflux up into the distal ureter.  If she elected this treatment option, would plan for very close ureteroscopic follow-up to ensure response to treatment.  We discussed this is not the standard of care, however could be considered option given her overall health and comorbidities.  We discussed that the typical response rate to  BCG is around 70% and presumably would be similar for the upper tract lesion.  We discussed referral to tertiary care center for second opinion which she declined. ° °Case was also discussed in multidisciplinary tumor board, see note. ° °She would like to proceed with more conservative options.  We will plan to return back to the operating room in a few weeks for second look ureteroscopy, laser ablation as deemed necessary, repeat biopsy is necessary and replacement of her ureteral stent.  All questions were answered. ° °We discussed BCG at length today as well.  We will plan to proceed with this a few weeks after her ureteroscopic procedure with a stent in place.  We discussed risk of BCG at length and she was given handouts and additional information about this process.  All questions were answered, of her husband and daughters as well. ° °Lastly, she is interested in referral to genetics.  This could  have implications for her biological children. ° °- Urinalysis, Complete w Microscopic; Future °- Urine Culture; Future ° °Artemisia Auvil, MD ° °[facility] °[address], [address] °, [place] [address] °[phone] ° ° °I spent 40 min with this patient of which greater than 50% was spent in counseling and coordination of care with the patient.  ° °

## 2018-10-25 NOTE — H&P (View-Only) (Signed)
10/25/2018 9:04 AM   Elizabeth Trujillo Oct 09, 1935 [id number]  Referring provider: [name]., MD [address] 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  She initially presented with gross hematuria over a year ago at which time she underwent a negative hematuria work-up.  She continued of intermittent gross hematuria.  Ultimately, she had a noncontrast CT scan with new evidence of left hydronephrosis and thus was taken to the operating room on 10/14/2018 for diagnostic left ureteroscopy.  This showed a lesion in the left distal ureter which is somewhat pedunculated as well as a more circumferential lesion consistent with high-grade urothelial carcinoma, depth of invasion unknown.  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  The remainder of the upper tract was unremarkable.  The lesion was biopsied and fulgurated using the laser.  She has been tolerating her stent well.  She is some pressure and urgency frequency associated with it.  No fevers or chills.  She does remark today that her left vaginal/clitoral pain is significantly improved since stent placement/surgery.  She wonders if this may have been the etiology of her pain all along.  No known smoking history.  No environmental exposures.  Notably, she does have a personal history of breast cancer.  Mother also had bladder and breast cancer.  She has had a series of TIAs in the recent past and is undergoing further evaluation with neurology.  She is no longer on Plavix.  She is a lot of questions about this today and will be seeing Dr. Rosanna Randy and her neurologist, Dr. Melrose Nakayama in the near future.   PMH: Past Medical History:  Diagnosis Date   Arthritis    Breast cancer ([place]) 1986   left  mastectomy   Cancer ([place]) 1986   left breast   Collagen vascular disease ([place])    RA    Elevated triglycerides with high cholesterol    GERD (gastroesophageal reflux disease)    history of reflux   Hypercholesterolemia    Hypertension    Stroke [facility])    TIA    Surgical History: Past Surgical History:  Procedure Laterality Date   ABDOMINAL HYSTERECTOMY  [id number]   TAH/BSO   BREAST BIOPSY Right 01/23/2013   [place] bx/clip-neg   BREAST SURGERY Left 07/1984   Mastectomy   callus removed from left toe     CATARACT EXTRACTION W/ INTRAOCULAR LENS IMPLANT Left 2015   CYSTOSCOPY WITH URETEROSCOPY AND STENT PLACEMENT Left 10/14/2018   Procedure: CYSTOSCOPY WITH URETEROSCOPY AND STENT PLACEMENT;  Surgeon: Hollice Espy, MD;  Location: [facility] ORS;  Service: Urology;  Laterality: Left;   EYE SURGERY Left 2013   FOOT SURGERY Left 04/2013   HOLMIUM LASER APPLICATION Left 06/17/960   Procedure: HOLMIUM LASER APPLICATION;  Surgeon: Hollice Espy, MD;  Location: [facility] ORS;  Service: Urology;  Laterality: Left;   KNEE ARTHROPLASTY Right 11/03/2015   Procedure: COMPUTER ASSISTED TOTAL KNEE ARTHROPLASTY;  Surgeon: Dereck Leep, MD;  Location: [facility] ORS;  Service: Orthopedics;  Laterality: Right;   MASTECTOMY Left 1986   positive   URETERAL BIOPSY Left 10/14/2018   Procedure: URETERAL BIOPSY;  Surgeon: Hollice Espy, MD;  Location: [facility] ORS;  Service: Urology;  Laterality: Left;    Home Medications:  Allergies as of 10/25/2018      Reactions   Penicillin V Potassium Hives   Has patient had a PCN reaction causing immediate rash, facial/tongue/throat swelling, SOB or lightheadedness with hypotension: no Has patient had a PCN reaction causing severe rash involving mucus membranes or skin necrosis: {no Has patient had a PCN reaction that required hospitalization no Has patient had a PCN reaction occurring within the last 10 years: no If all of the above answers are "NO", then may  proceed with Cephalosporin use.   Sulfa Antibiotics Hives      Medication List       Accurate as of October 25, 2018 11:59 PM. If you have any questions, ask your nurse or doctor.        acetaminophen 500 MG tablet Commonly known as: TYLENOL Take 500-1,000 mg by mouth every 6 (six) hours as needed (pain.).   amLODipine 5 MG tablet Commonly known as: NORVASC Take 1 tablet (5 mg total) by mouth daily.   aspirin EC 81 MG tablet Take 81 mg by mouth at bedtime.   atorvastatin 40 MG tablet Commonly known as: Lipitor Take 1 tablet (40 mg total) by mouth daily. What changed: when to take this   CALCIUM+D3 PO Take 1 tablet by mouth daily at 2 PM.   cholecalciferol 25 MCG (1000 UT) tablet Commonly known as: VITAMIN D Take 1,000 Units by mouth at bedtime.   clopidogrel 75 MG tablet Commonly known as: Plavix Take 1 tablet (75 mg total) by mouth daily. What changed: when to take this   gabapentin 100 MG capsule Commonly known as: NEURONTIN Take 2 capsules (200 mg total) by mouth 2 (two) times daily.   HYDROcodone-acetaminophen 5-325 MG tablet Commonly known as: NORCO/VICODIN Take 1-2 tablets by mouth every 6 (six) hours as needed for moderate pain.   ketoconazole 2 % shampoo Commonly known as: NIZORAL Apply 1 application topically every three (3) days as needed (flaky/itchy scalp).   levETIRAcetam 250 MG tablet Commonly known as: KEPPRA Take 250 mg by mouth 2 (two) times daily.   multivitamin with minerals Tabs tablet Take 1 tablet by mouth daily at 2 PM.   nystatin cream Commonly known as: MYCOSTATIN Apply 1 application topically 2 (two) times daily. What changed:   when to take this  reasons to take this   tamsulosin 0.4 MG Caps capsule Commonly known as: Flomax Take 1 capsule (0.4 mg total) by mouth daily.   traMADol 50 MG tablet Commonly known as: ULTRAM Take 1 tablet (50 mg total) by mouth 2 (two) times daily. What changed:   when to take  this  reasons to take this       Allergies:  Allergies  Allergen Reactions   Penicillin V Potassium Hives    Has patient had a PCN reaction causing immediate rash, facial/tongue/throat swelling, SOB or lightheadedness with hypotension: no Has patient had a PCN reaction causing severe rash involving mucus membranes or skin necrosis: {no Has patient had a PCN reaction that required hospitalization no Has patient had a PCN reaction occurring within the last 10 years: no If all of the above answers are "NO", then may proceed with Cephalosporin use.   Sulfa Antibiotics Hives    Family History: Family History  Problem Relation Age of Onset   Cancer Mother        baldder and breast   Glaucoma Mother    Kidney disease Mother    Arthritis Mother    Stroke Mother  Breast cancer Mother 42   Heart disease Father    Cancer Brother        lung cancer, smoker   Breast cancer Brother    Breast cancer Cousin        3 mat cousins    Social History:  reports that she has never smoked. She has never used smokeless tobacco. She reports that she does not drink alcohol or use drugs.  ROS: UROLOGY Frequent Urination?: No Hard to postpone urination?: No Burning/pain with urination?: No Get up at night to urinate?: No Leakage of urine?: No Urine stream starts and stops?: No Trouble starting stream?: No Do you have to strain to urinate?: No Blood in urine?: No Urinary tract infection?: No Sexually transmitted disease?: No Injury to kidneys or bladder?: No Painful intercourse?: No Weak stream?: No Currently pregnant?: No Vaginal bleeding?: No Last menstrual period?: n  Gastrointestinal Nausea?: No Vomiting?: No Indigestion/heartburn?: No Diarrhea?: No Constipation?: No  Constitutional Fever: No Night sweats?: No Weight loss?: No Fatigue?: No  Skin Skin rash/lesions?: No Itching?: No  Eyes Blurred vision?: No Double vision?: No  Ears/Nose/Throat Sore  throat?: No Sinus problems?: No  Hematologic/Lymphatic Swollen glands?: No Easy bruising?: No  Cardiovascular Leg swelling?: No Chest pain?: No  Respiratory Cough?: No Shortness of breath?: No  Endocrine Excessive thirst?: No  Musculoskeletal Back pain?: No Joint pain?: No  Neurological Headaches?: No Dizziness?: No  Psychologic Depression?: No Anxiety?: No  Physical Exam: BP 129/77    Pulse 76   Constitutional:  Alert and oriented, No acute distress. Accompanied by her husband today as well as her daughter by face time. HEENT: [place] AT, moist mucus membranes.  Trachea midline, no masses. Cardiovascular: No clubbing, cyanosis, or edema. Respiratory: Normal respiratory effort, no increased work of breathing. Skin: No rashes, bruises or suspicious lesions. Neurologic: Grossly intact, no focal deficits, moving all 4 extremities. Psychiatric: Normal mood and affect.  Laboratory Data: Lab Results  Component Value Date   WBC 10.5 10/07/2018   HGB 14.6 10/07/2018   HCT 45.7 10/07/2018   MCV 94 10/07/2018   PLT 320 10/07/2018    Lab Results  Component Value Date   CREATININE 0.66 10/07/2018     Lab Results  Component Value Date   HGBA1C 6.1 (H) 09/09/2018    Urinalysis Results for orders placed or performed during the hospital encounter of 10/14/18  Surgical pathology  Result Value Ref Range   SURGICAL PATHOLOGY      Surgical Pathology CASE: [id number] PATIENT: Delena Bali Surgical Pathology Report     SPECIMEN SUBMITTED: A. Ureter, left B. Bladder; bx  CLINICAL HISTORY: None provided  PRE-OPERATIVE DIAGNOSIS: Left hydronephrosis  POST-OPERATIVE DIAGNOSIS: Same as pre-op    DIAGNOSIS: A.  URETER, LEFT DISTAL; BIOPSY: - UROTHELIAL CARCINOMA, HIGH-GRADE (WHO/ISUP), DEFINITIVE INVASION NOT IDENTIFIED. - SCANT MUSCULARIS PROPRIA PRESENT, AND UNINVOLVED.  B.  URINARY BLADDER, LEFT URETERAL ORIFICE; TRANSURETHRAL BIOPSY: - UROTHELIAL  CARCINOMA IN SITU. - CYSTITIS CYSTICA. - NEGATIVE FOR INVASIVE CARCINOMA.  GROSS DESCRIPTION: A. Labeled: Left ureter Received: In formalin Tissue fragment(s): 7 Size: From less than 0.1-0.1 cm Description: Tonsil tissue fragment Entirely submitted in 1 cassette.  B. Labeled: Bladder biopsy Received: In formalin Tissue fragment(s): 4 Size: From 0.1-0.2 cm Description: Soft tissue fragments Entirely submitted in 1 cassette.    Final Diagnosis performed by Allena Napoleon, MD.   Electronically signed 10/16/2018 1:28:03PM The electronic signature indicates that the named Attending Pathologist has evaluated the specimen  Technical component performed  at [facility], [address], [place], [place] [address] Lab: [phone] Dir: Rush Farmer, MD, MMM  Professional component performed at [facility], [facility], [place], [place], [place] [address] Lab: [phone] Dir: Dellia Nims. Reuel Derby, MD     Assessment & Plan:    1. Hydronephrosis of left kidney Secondary to obstructing tumor Status post stent placement  2. Malignant neoplasm of urinary bladder, unspecified site [facility]) Left upper tract urothelial carcinoma of the ureter, high-grade presumably noninvasive  Discussed the standard of care in this situation would be nephroureterectomy, however given her medical comorbidities age amongst others, we discussed alternative options including including more conservative treatment with second look ureteroscopy, additional fulguration of the lesion as needed for local control followed by BCG to the bladder as well as with a stent in place to allow for reflux up into the distal ureter.  If she elected this treatment option, would plan for very close ureteroscopic follow-up to ensure response to treatment.  We discussed this is not the standard of care, however could be considered option given her overall health and comorbidities.  We discussed that the typical response rate to  BCG is around 70% and presumably would be similar for the upper tract lesion.  We discussed referral to tertiary care center for second opinion which she declined.  Case was also discussed in multidisciplinary tumor board, see note.  She would like to proceed with more conservative options.  We will plan to return back to the operating room in a few weeks for second look ureteroscopy, laser ablation as deemed necessary, repeat biopsy is necessary and replacement of her ureteral stent.  All questions were answered.  We discussed BCG at length today as well.  We will plan to proceed with this a few weeks after her ureteroscopic procedure with a stent in place.  We discussed risk of BCG at length and she was given handouts and additional information about this process.  All questions were answered, of her husband and daughters as well.  Lastly, she is interested in referral to genetics.  This could  have implications for her biological children.  - Urinalysis, Complete w Microscopic; Future - Urine Culture; Future  Hollice Espy, MD  [facility] [address], [place], [place] [address] [phone]   I spent 40 min with this patient of which greater than 50% was spent in counseling and coordination of care with the patient.

## 2018-10-25 NOTE — Patient Instructions (Signed)
Bacillus Calmette-Guerin Live, BCG intravesical solution What is this medicine? BACILLUS CALMETTE-GUERIN LIVE, BCG (ba SIL us KAL met gay RAYN) is a bacteria solution. This medicine stimulates the immune system to ward off cancer cells. It is used to treat bladder cancer. This medicine may be used for other purposes; ask your health care provider or pharmacist if you have questions. COMMON BRAND NAME(S): Theracys, TICE BCG What should I tell my health care provider before I take this medicine? They need to know if you have any of these conditions:  aneurysm  blood in the urine  bladder biopsy within 2 weeks  fever or infection  immune system problems  leukemia  lymphoma  myasthenia gravis  need organ transplant  prosthetic device like arterial graft, artificial joint, prosthetic heart valve  recent or ongoing radiation therapy  tuberculosis  an unusual or allergic reaction to Bacillus Calmette-Guerin Live, BCG, latex, other medicines, foods, dyes, or preservatives  pregnant or trying to get pregnant  breast-feeding How should I use this medicine? This drug is given as a catheter infusion into the bladder. It is administered in a hospital or clinic by a specially trained health care provider. You will be given directions to follow before the treatment. Follow your health care provider's directions carefully. This medicine contains live bacteria. It is very important to follow these directions closely after treatment to prevent others from coming in contact with your urine. Your health care provider may give you additional directions to follow. Try to hold this medicine in your bladder for 1 to 2 hours. Follow these directions the first time you go to the bathroom and for 6 hours after the first void.  Wash your hands before using the restroom. After voiding, wash your hands and genital area.  Use a toilet and sit when going to the bathroom. This helps to prevent the urine  from splashing. Do not use public toilets or void outside.  After each void, add 2 cups of undiluted bleach to the toilet. Close the lid. Wait 15 to 20 minutes and then flush the toilet.  After the first void, drink more fluids to help dilute your urine.  If you have urinary incontinence, wash the clothes you were wearing in the washer immediately. Do not wash other clothes at the same time.  If you are wearing an incontinence pad, pour bleach on the pad and allow it to soak into the pad before throwing it away. Put the pad in a plastic bag and put it in the trash. Talk to your pediatrician regarding the use of this medicine in children. Special care may be needed. Overdosage: If you think you have taken too much of this medicine contact a poison control center or emergency room at once. NOTE: This medicine is only for you. Do not share this medicine with others. What if I miss a dose? It is important not to miss your dose. Call your doctor or health care professional if you are unable to keep an appointment. What may interact with this medicine?  antibiotics  medicines to suppress your immune system like chemotherapy agents or corticosteroids  medicine to treat tuberculosis This list may not describe all possible interactions. Give your health care provider a list of all the medicines, herbs, non-prescription drugs, or dietary supplements you use. Also tell them if you smoke, drink alcohol, or use illegal drugs. Some items may interact with your medicine. What should I watch for while using this medicine? Visit your health care   provider for checks on your progress. This medicine may make you feel generally unwell. Contact your health care provider if your symptoms last more than 2 days or if they get worse. Call your health care provider right away if you have a severe or unusual symptom. Infection can be spread to others through contact with this medicine. To prevent the spread of  infection, follow your health care provider's directions carefully after treatment. Do not become pregnant while taking this medicine. There is a potential for serious side effects to an unborn child. Talk to your health care provider for more information. Do not breast-feed an infant while taking this medicine. If you have sex while on this medicine, use a condom. Ask your health care provider how long you should use a condom. What side effects may I notice from receiving this medicine? Side effects that you should report to your doctor or health care professional as soon as possible:  allergic reactions like skin rash, itching or hives, swelling of the face, lips, or tongue  signs of infection - fever or chills, cough, sore throat, pain or difficulty passing urine  signs of decreased red blood cells - unusually weak or tired, fainting spells, lightheadedness  blood in urine  breathing problems  cough  eye pain, redness  flu-like symptoms  joint pain  bladder-area pain for more than 2 days after treatment  trouble passing urine or change in the amount of urine  vomiting  yellowing of the eyes or skin Side effects that usually do not require medical attention (report to your doctor or health care professional if they continue or are bothersome):  bladder spasm  burning when passing urine within 2 days of treatment  feel need to pass urine often or wake up at night to pass urine  loss of appetite This list may not describe all possible side effects. Call your doctor for medical advice about side effects. You may report side effects to FDA at 1-800-FDA-1088. Where should I keep my medicine? This drug is given in a hospital or clinic and will not be stored at home. NOTE: This sheet is a summary. It may not cover all possible information. If you have questions about this medicine, talk to your doctor, pharmacist, or health care provider.  2020 Elsevier/Gold Standard  (2018-05-03 12:05:29)  

## 2018-10-27 LAB — URINE CULTURE: Culture: >=100000 — AB

## 2018-10-29 ENCOUNTER — Ambulatory Visit: Payer: Medicare Other

## 2018-10-29 ENCOUNTER — Other Ambulatory Visit: Payer: Self-pay

## 2018-10-29 ENCOUNTER — Ambulatory Visit (INDEPENDENT_AMBULATORY_CARE_PROVIDER_SITE_OTHER): Payer: Medicare Other | Admitting: Family Medicine

## 2018-10-29 ENCOUNTER — Encounter: Payer: Self-pay | Admitting: Family Medicine

## 2018-10-29 VITALS — BP 105/67 | HR 90 | Temp 97.3°F | Resp 18 | Wt 216.4 lb

## 2018-10-29 DIAGNOSIS — I999 Unspecified disorder of circulatory system: Secondary | ICD-10-CM | POA: Diagnosis not present

## 2018-10-29 DIAGNOSIS — B372 Candidiasis of skin and nail: Secondary | ICD-10-CM | POA: Diagnosis not present

## 2018-10-29 DIAGNOSIS — Z170 Estrogen receptor positive status [ER+]: Secondary | ICD-10-CM

## 2018-10-29 DIAGNOSIS — C50912 Malignant neoplasm of unspecified site of left female breast: Secondary | ICD-10-CM | POA: Diagnosis not present

## 2018-10-29 DIAGNOSIS — G459 Transient cerebral ischemic attack, unspecified: Secondary | ICD-10-CM | POA: Diagnosis not present

## 2018-10-29 DIAGNOSIS — C689 Malignant neoplasm of urinary organ, unspecified: Secondary | ICD-10-CM

## 2018-10-29 DIAGNOSIS — G40909 Epilepsy, unspecified, not intractable, without status epilepticus: Secondary | ICD-10-CM

## 2018-10-29 DIAGNOSIS — Z6837 Body mass index (BMI) 37.0-37.9, adult: Secondary | ICD-10-CM | POA: Diagnosis not present

## 2018-10-29 NOTE — Progress Notes (Signed)
[name] Female    DOB: Nov 30, 1935   83 y.o.   MRN: [id number] Visit Date: 10/29/2018  Today's Provider: [name], MD   No chief complaint on file.  Subjective:     HPI Patient would like to discuss medications. She is having symptoms of a vaginal yeast infection for one week. She is only having itching, and using nystatin cream.  She just found out that the urology w/u yielded and urothelial cancer. Her pain is doing fairly well.She needs further surgical procedures for this cancer . Locaolized at this time.  Allergies  Allergen Reactions   Penicillin V Potassium Hives    Has patient had a PCN reaction causing immediate rash, facial/tongue/throat swelling, SOB or lightheadedness with hypotension: no Has patient had a PCN reaction causing severe rash involving mucus membranes or skin necrosis: {no Has patient had a PCN reaction that required hospitalization no Has patient had a PCN reaction occurring within the last 10 years: no If all of the above answers are "NO", then may proceed with Cephalosporin use.   Sulfa Antibiotics Hives     Current Outpatient Medications:    acetaminophen (TYLENOL) 500 MG tablet, Take 500-1,000 mg by mouth every 6 (six) hours as needed (pain.)., Disp: , Rfl:    amLODipine (NORVASC) 5 MG tablet, Take 1 tablet (5 mg total) by mouth daily., Disp: 30 tablet, Rfl: 1   aspirin EC 81 MG tablet, Take 81 mg by mouth at bedtime., Disp: , Rfl:    gabapentin (NEURONTIN) 100 MG capsule, Take 2 capsules (200 mg total) by mouth 2 (two) times daily., Disp: 60 capsule, Rfl: 5   HYDROcodone-acetaminophen (NORCO/VICODIN) 5-325 MG tablet, Take 1-2 tablets by mouth every 6 (six) hours as needed for moderate pain., Disp: 10 tablet, Rfl: 0   ketoconazole (NIZORAL) 2 % shampoo, Apply 1 application topically every three (3) days as needed (flaky/itchy scalp). , Disp: , Rfl:    levETIRAcetam (KEPPRA) 250 MG tablet, Take 250 mg by mouth  2 (two) times daily. , Disp: , Rfl:    Multiple Vitamin (MULTIVITAMIN WITH MINERALS) TABS tablet, Take 1 tablet by mouth daily at 2 PM., Disp: , Rfl:    nystatin cream (MYCOSTATIN), Apply 1 application topically 2 (two) times daily. (Patient taking differently: Apply 1 application topically 2 (two) times daily as needed (yeast). ), Disp: 30 g, Rfl: 1   tamsulosin (FLOMAX) 0.4 MG CAPS capsule, Take 1 capsule (0.4 mg total) by mouth daily., Disp: 30 capsule, Rfl: 0   traMADol (ULTRAM) 50 MG tablet, Take 1 tablet (50 mg total) by mouth 2 (two) times daily. (Patient taking differently: Take 50 mg by mouth 2 (two) times daily as needed (pain.). ), Disp: 30 tablet, Rfl: 0   atorvastatin (LIPITOR) 40 MG tablet, Take 1 tablet (40 mg total) by mouth daily. (Patient taking differently: Take 40 mg by mouth at bedtime. ), Disp: 30 tablet, Rfl: 11   Calcium Carb-Cholecalciferol (CALCIUM+D3 PO), Take 1 tablet by mouth daily at 2 PM., Disp: , Rfl:    cholecalciferol (VITAMIN D) 25 MCG (1000 UT) tablet, Take 1,000 Units by mouth at bedtime., Disp: , Rfl:    clopidogrel (PLAVIX) 75 MG tablet, Take 1 tablet (75 mg total) by mouth daily. (Patient taking differently: Take 75 mg by mouth at bedtime. ), Disp: 30 tablet, Rfl: 11  Review of Systems  HENT: Negative.   Eyes: Negative.   Respiratory: Negative.   Cardiovascular: Negative.  Gastrointestinal: Negative.   Endocrine: Negative.   Genitourinary: Negative.   Skin: Positive for rash.  Allergic/Immunologic: Negative.   Neurological: Negative.   Psychiatric/Behavioral: Negative.   All other systems reviewed and are negative.   Social History   Tobacco Use   Smoking status: Never Smoker   Smokeless tobacco: Never Used  Substance Use Topics   Alcohol use: No      Objective:   BP 105/67 (BP Location: Left Arm, Patient Position: Sitting, Cuff Size: Large)    Pulse 90    Temp (!) 97.3 F (36.3 C) (Oral)    Resp 18    Wt 216 lb 6.4 oz (98.2 kg)     SpO2 97%    BMI 38.33 kg/m  Vitals:   10/29/18 1352  BP: 105/67  Pulse: 90  Resp: 18  Temp: (!) 97.3 F (36.3 C)  TempSrc: Oral  SpO2: 97%  Weight: 216 lb 6.4 oz (98.2 kg)     Physical Exam Vitals signs reviewed.  Constitutional:      Appearance: Normal appearance.  HENT:     Head: Normocephalic and atraumatic.     Right Ear: External ear normal.     Left Ear: External ear normal.  Eyes:     General: No scleral icterus.    Conjunctiva/sclera: Conjunctivae normal.  Cardiovascular:     Rate and Rhythm: Normal rate and regular rhythm.     Pulses: Normal pulses.     Heart sounds: Normal heart sounds.  Pulmonary:     Effort: Pulmonary effort is normal.     Breath sounds: Normal breath sounds.  Abdominal:     Palpations: Abdomen is soft.  Skin:    General: Skin is warm and dry.     Findings: Rash present.     Comments: Rash in groin c/w  Yeast with no secondary infection.  Neurological:     General: No focal deficit present.     Mental Status: She is alert and oriented to person, place, and time.  Psychiatric:        Mood and Affect: Mood normal.        Behavior: Behavior normal.        Thought Content: Thought content normal.        Judgment: Judgment normal.      No results found for any visits on 10/29/18.     Assessment & Plan     1. Urothelial carcinoma [facility]) Per urology. More than 50% 25 minute visit spent in counseling or coordination of care-0 2. Angiopathy   3. Malignant neoplasm of left breast in female, estrogen receptor positive, unspecified site of breast ([place])   4. Class 2 severe obesity due to excess calories with serious comorbidity and body mass index (BMI) of 37.0 to 37.9 in adult ([place])   5. Yeast dermatitis   6. TIA (transient ischemic attack) Per neurology. OK to stop plavix for procedures.  7. Seizure disorder [facility]) Neurology.     Asti Mackley Cranford Mon, MD  [facility]

## 2018-10-29 NOTE — Patient Instructions (Addendum)
Stay on Gabapentin.  Use Nystatin twice daily for rash.  Stop Amlodipine. Check BP weekly.   Blood pressure should stay less than 140/90.

## 2018-10-30 ENCOUNTER — Telehealth: Payer: Self-pay | Admitting: Urology

## 2018-10-30 NOTE — Telephone Encounter (Signed)
We have already discussed most of this at her visit.   It is fine if she wants to move up the EEG but is not willing to make any difference in what we do.  No need to recollect, multiple spaces are fine.  He does not need to have a cath UA her recollection.  She can continue Flomax while her stent is in, this will likely help with the stent discomfort.  I am referring her to genetics.  She does not need to do anything, way to be contacted.

## 2018-10-30 NOTE — Telephone Encounter (Signed)
Called patient and notified her that surgery was scheduled for 11-11-18. She will be contacted with a pre-op apt date and time. Patient was told clearance was sent to Dr. Trena Platt office to stop Plavix and ASA. Patient states that she was instructed by Dr. Manuella Ghazi that she no longer needs to take Plavix. She also states that she has an upcoming EEG on 12-06-18 with Dr. Manuella Ghazi and she is not sure if this needs to be moved up. She was informed that we would contact Dr. Trena Platt office to clarify.  Patient's urine from Friday grew multiple species and recommends recollection, should patient come in for a cath UA or just another recollection? Patient also wanted to know if she should continue flomax? Patient inquired in regards to genetic testing, is this something we are needing to set her up with or the cancer center?

## 2018-10-30 NOTE — Telephone Encounter (Signed)
This patient and Denny Peon have been calling each other in regards to surgery. The patient called back stating that she was ready to schedule her surgery? Can you call her to discuss?   Thanks, Sharyn Lull

## 2018-10-30 NOTE — Telephone Encounter (Signed)
Patient notified. And discussed in detail in regards to the BCG treatment after surgery, and we would call to schedule that 6wk post op

## 2018-11-01 DIAGNOSIS — G459 Transient cerebral ischemic attack, unspecified: Secondary | ICD-10-CM | POA: Insufficient documentation

## 2018-11-07 ENCOUNTER — Other Ambulatory Visit: Payer: Self-pay

## 2018-11-07 ENCOUNTER — Other Ambulatory Visit
Admission: RE | Admit: 2018-11-07 | Discharge: 2018-11-07 | Disposition: A | Discharge status: Home / Self Care | Payer: Medicare Other | Source: Ambulatory Visit | Attending: Urology | Admitting: Urology

## 2018-11-07 DIAGNOSIS — Z01812 Encounter for preprocedural laboratory examination: Secondary | ICD-10-CM | POA: Diagnosis not present

## 2018-11-07 DIAGNOSIS — Z20828 Contact with and (suspected) exposure to other viral communicable diseases: Secondary | ICD-10-CM | POA: Diagnosis not present

## 2018-11-07 LAB — SARS CORONAVIRUS 2 (TAT 6-24 HRS): SARS Coronavirus 2: NEGATIVE

## 2018-11-11 ENCOUNTER — Ambulatory Visit
Admission: RE | Admit: 2018-11-11 | Discharge: 2018-11-11 | Disposition: A | Discharge status: Home / Self Care | Payer: Medicare Other | Attending: Urology | Admitting: Urology

## 2018-11-11 ENCOUNTER — Encounter: Payer: Self-pay | Admitting: Emergency Medicine

## 2018-11-11 ENCOUNTER — Ambulatory Visit: Payer: Medicare Other | Admitting: Anesthesiology

## 2018-11-11 ENCOUNTER — Encounter
Admission: RE | Disposition: A | Discharge status: Home / Self Care | Payer: Self-pay | Source: Home / Self Care | Attending: Urology

## 2018-11-11 ENCOUNTER — Telehealth: Payer: Self-pay | Admitting: Urology

## 2018-11-11 DIAGNOSIS — Z8673 Personal history of transient ischemic attack (TIA), and cerebral infarction without residual deficits: Secondary | ICD-10-CM | POA: Insufficient documentation

## 2018-11-11 DIAGNOSIS — E7800 Pure hypercholesterolemia, unspecified: Secondary | ICD-10-CM | POA: Insufficient documentation

## 2018-11-11 DIAGNOSIS — K219 Gastro-esophageal reflux disease without esophagitis: Secondary | ICD-10-CM | POA: Insufficient documentation

## 2018-11-11 DIAGNOSIS — Z8551 Personal history of malignant neoplasm of bladder: Secondary | ICD-10-CM | POA: Diagnosis present

## 2018-11-11 DIAGNOSIS — M069 Rheumatoid arthritis, unspecified: Secondary | ICD-10-CM | POA: Diagnosis not present

## 2018-11-11 DIAGNOSIS — Z8052 Family history of malignant neoplasm of bladder: Secondary | ICD-10-CM | POA: Insufficient documentation

## 2018-11-11 DIAGNOSIS — D4122 Neoplasm of uncertain behavior of left ureter: Secondary | ICD-10-CM | POA: Diagnosis not present

## 2018-11-11 DIAGNOSIS — Z880 Allergy status to penicillin: Secondary | ICD-10-CM | POA: Insufficient documentation

## 2018-11-11 DIAGNOSIS — N3080 Other cystitis without hematuria: Secondary | ICD-10-CM | POA: Diagnosis not present

## 2018-11-11 DIAGNOSIS — Z882 Allergy status to sulfonamides status: Secondary | ICD-10-CM | POA: Insufficient documentation

## 2018-11-11 DIAGNOSIS — M1990 Unspecified osteoarthritis, unspecified site: Secondary | ICD-10-CM | POA: Insufficient documentation

## 2018-11-11 DIAGNOSIS — E781 Pure hyperglyceridemia: Secondary | ICD-10-CM | POA: Diagnosis not present

## 2018-11-11 DIAGNOSIS — D090 Carcinoma in situ of bladder: Secondary | ICD-10-CM | POA: Insufficient documentation

## 2018-11-11 DIAGNOSIS — Z803 Family history of malignant neoplasm of breast: Secondary | ICD-10-CM | POA: Insufficient documentation

## 2018-11-11 DIAGNOSIS — Z9012 Acquired absence of left breast and nipple: Secondary | ICD-10-CM | POA: Insufficient documentation

## 2018-11-11 DIAGNOSIS — Z9842 Cataract extraction status, left eye: Secondary | ICD-10-CM | POA: Insufficient documentation

## 2018-11-11 DIAGNOSIS — C662 Malignant neoplasm of left ureter: Secondary | ICD-10-CM | POA: Insufficient documentation

## 2018-11-11 DIAGNOSIS — Z90710 Acquired absence of both cervix and uterus: Secondary | ICD-10-CM | POA: Diagnosis not present

## 2018-11-11 DIAGNOSIS — N1330 Unspecified hydronephrosis: Secondary | ICD-10-CM | POA: Diagnosis not present

## 2018-11-11 DIAGNOSIS — M359 Systemic involvement of connective tissue, unspecified: Secondary | ICD-10-CM | POA: Diagnosis not present

## 2018-11-11 DIAGNOSIS — Z961 Presence of intraocular lens: Secondary | ICD-10-CM | POA: Insufficient documentation

## 2018-11-11 DIAGNOSIS — Z853 Personal history of malignant neoplasm of breast: Secondary | ICD-10-CM | POA: Diagnosis not present

## 2018-11-11 DIAGNOSIS — I10 Essential (primary) hypertension: Secondary | ICD-10-CM | POA: Insufficient documentation

## 2018-11-11 HISTORY — PX: CYSTOSCOPY/URETEROSCOPY/HOLMIUM LASER/STENT PLACEMENT: SHX6546

## 2018-11-11 HISTORY — PX: BIOPSY: SHX5522

## 2018-11-11 SURGERY — CYSTOSCOPY/URETEROSCOPY/HOLMIUM LASER/STENT PLACEMENT
Anesthesia: General | Site: Ureter | Laterality: Left

## 2018-11-11 MED ORDER — LIDOCAINE HCL (CARDIAC) PF 100 MG/5ML IV SOSY
PREFILLED_SYRINGE | INTRAVENOUS | Status: DC | PRN
  Administered 2018-11-11: 100 mg via INTRAVENOUS

## 2018-11-11 MED ORDER — PROPOFOL 10 MG/ML IV BOLUS
INTRAVENOUS | Status: AC
  Filled 2018-11-11: qty 20

## 2018-11-11 MED ORDER — IOHEXOL 180 MG/ML  SOLN
INTRAMUSCULAR | Status: DC | PRN
  Administered 2018-11-11: 20 mL

## 2018-11-11 MED ORDER — PROPOFOL 10 MG/ML IV BOLUS
INTRAVENOUS | Status: DC | PRN
  Administered 2018-11-11: 20 mg via INTRAVENOUS
  Administered 2018-11-11: 80 mg via INTRAVENOUS
  Administered 2018-11-11: 30 mg via INTRAVENOUS

## 2018-11-11 MED ORDER — ONDANSETRON HCL 4 MG/2ML IJ SOLN
INTRAMUSCULAR | Status: DC | PRN
  Administered 2018-11-11: 4 mg via INTRAVENOUS

## 2018-11-11 MED ORDER — FAMOTIDINE 20 MG PO TABS
ORAL_TABLET | ORAL | Status: AC
  Administered 2018-11-11: 09:00:00 20 mg via ORAL
  Filled 2018-11-11: qty 1

## 2018-11-11 MED ORDER — FENTANYL CITRATE (PF) 100 MCG/2ML IJ SOLN
INTRAMUSCULAR | Status: DC | PRN
  Administered 2018-11-11 (×2): 25 ug via INTRAVENOUS

## 2018-11-11 MED ORDER — FAMOTIDINE 20 MG PO TABS
20.0000 mg | ORAL_TABLET | Freq: Once | ORAL | Status: AC
  Administered 2018-11-11: 09:00:00 20 mg via ORAL

## 2018-11-11 MED ORDER — FENTANYL CITRATE (PF) 100 MCG/2ML IJ SOLN: 25.0000 ug | INTRAMUSCULAR | Status: DC | PRN

## 2018-11-11 MED ORDER — FENTANYL CITRATE (PF) 100 MCG/2ML IJ SOLN
INTRAMUSCULAR | Status: AC
  Filled 2018-11-11: qty 2

## 2018-11-11 MED ORDER — DEXAMETHASONE SODIUM PHOSPHATE 10 MG/ML IJ SOLN
INTRAMUSCULAR | Status: DC | PRN
  Administered 2018-11-11: 10 mg via INTRAVENOUS

## 2018-11-11 MED ORDER — LACTATED RINGERS IV SOLN
INTRAVENOUS | Status: DC
  Administered 2018-11-11: 09:00:00 via INTRAVENOUS

## 2018-11-11 MED ORDER — CIPROFLOXACIN IN D5W 400 MG/200ML IV SOLN
400.0000 mg | Freq: Two times a day (BID) | INTRAVENOUS | Status: DC
  Administered 2018-11-11: 09:00:00 400 mg via INTRAVENOUS

## 2018-11-11 MED ORDER — ONDANSETRON HCL 4 MG/2ML IJ SOLN: 4.0000 mg | Freq: Once | INTRAMUSCULAR | Status: DC | PRN

## 2018-11-11 MED ORDER — CIPROFLOXACIN IN D5W 400 MG/200ML IV SOLN
INTRAVENOUS | Status: AC
  Filled 2018-11-11: qty 200

## 2018-11-11 SURGICAL SUPPLY — 41 items
BAG DRAIN CYSTO-URO LG1000N (MISCELLANEOUS) ×4 IMPLANT
BASKET ZERO TIP 1.9FR (BASKET) IMPLANT
BRUSH SCRUB EZ  4% CHG (MISCELLANEOUS) ×2
BRUSH SCRUB EZ 1% IODOPHOR (MISCELLANEOUS) ×4 IMPLANT
BRUSH SCRUB EZ 4% CHG (MISCELLANEOUS) ×2 IMPLANT
BSKT STON RTRVL ZERO TP 1.9FR (BASKET)
CATH URETL 5X70 OPEN END (CATHETERS) ×4 IMPLANT
CNTNR SPEC 2.5X3XGRAD LEK (MISCELLANEOUS)
CONT SPEC 4OZ STER OR WHT (MISCELLANEOUS)
CONT SPEC 4OZ STRL OR WHT (MISCELLANEOUS)
CONTAINER SPEC 2.5X3XGRAD LEK (MISCELLANEOUS) IMPLANT
DRAPE UTILITY 15X26 TOWEL STRL (DRAPES) ×4 IMPLANT
DRSG TELFA 4X3 1S NADH ST (GAUZE/BANDAGES/DRESSINGS) ×4 IMPLANT
ELECT REM PT RETURN 9FT ADLT (ELECTROSURGICAL) ×4
ELECTRODE REM PT RTRN 9FT ADLT (ELECTROSURGICAL) ×2 IMPLANT
FIBER LASER TRAC TIP (UROLOGICAL SUPPLIES) IMPLANT
FORCEPS BIOP PIRANHA Y (CUTTING FORCEPS) ×2 IMPLANT
GLOVE BIO SURGEON STRL SZ 6.5 (GLOVE) ×3 IMPLANT
GLOVE BIO SURGEONS STRL SZ 6.5 (GLOVE) ×1
GLOVE BIOGEL M 6.5 STRL (GLOVE) ×4 IMPLANT
GOWN STRL REUS W/ TWL LRG LVL3 (GOWN DISPOSABLE) ×4 IMPLANT
GOWN STRL REUS W/TWL LRG LVL3 (GOWN DISPOSABLE) ×8
GUIDEWIRE GREEN .038 145CM (MISCELLANEOUS) IMPLANT
GUIDEWIRE STR DUAL SENSOR (WIRE) ×4 IMPLANT
INFUSOR MANOMETER BAG 3000ML (MISCELLANEOUS) ×4 IMPLANT
INTRODUCER DILATOR DOUBLE (INTRODUCER) IMPLANT
KIT TURNOVER CYSTO (KITS) ×4 IMPLANT
NDL SAFETY ECLIPSE 18X1.5 (NEEDLE) ×2 IMPLANT
NEEDLE HYPO 18GX1.5 SHARP (NEEDLE) ×4
PACK CYSTO AR (MISCELLANEOUS) ×4 IMPLANT
SET CYSTO W/LG BORE CLAMP LF (SET/KITS/TRAYS/PACK) ×4 IMPLANT
SHEATH URETERAL 12FRX35CM (MISCELLANEOUS) IMPLANT
SOL .9 NS 3000ML IRR  AL (IV SOLUTION) ×2
SOL .9 NS 3000ML IRR AL (IV SOLUTION) ×2
SOL .9 NS 3000ML IRR UROMATIC (IV SOLUTION) ×2 IMPLANT
STENT URET 6FRX24 CONTOUR (STENTS) IMPLANT
STENT URET 6FRX26 CONTOUR (STENTS) IMPLANT
STENT URO INLAY 6FRX24CM (STENTS) ×2 IMPLANT
SURGILUBE 2OZ TUBE FLIPTOP (MISCELLANEOUS) ×4 IMPLANT
WATER STERILE IRR 1000ML POUR (IV SOLUTION) ×4 IMPLANT
WATER STERILE IRR 3000ML UROMA (IV SOLUTION) ×4 IMPLANT

## 2018-11-11 NOTE — Anesthesia Preprocedure Evaluation (Signed)
Anesthesia Evaluation  Patient identified by MRN, date of birth, ID band Patient awake    Reviewed: Allergy & Precautions, NPO status , Patient's Chart, lab work & pertinent test results  History of Anesthesia Complications Negative for: history of anesthetic complications  Airway Mallampati: II       Dental   Pulmonary neg sleep apnea, neg COPD, Not current smoker,           Cardiovascular hypertension, Pt. on medications (-) Past MI and (-) CHF (-) dysrhythmias (-) Valvular Problems/Murmurs     Neuro/Psych Seizures - (? seizure w/u in progress),  TIA (speech difficulties)   GI/Hepatic Neg liver ROS, neg GERD  ,  Endo/Other  neg diabetes  Renal/GU negative Renal ROS     Musculoskeletal   Abdominal   Peds  Hematology   Anesthesia Other Findings   Reproductive/Obstetrics                             Anesthesia Physical Anesthesia Plan  ASA: III  Anesthesia Plan: General   Post-op Pain Management:    Induction: Intravenous  PONV Risk Score and Plan: 3 and Dexamethasone, Ondansetron and Treatment may vary due to age or medical condition  Airway Management Planned: LMA  Additional Equipment:   Intra-op Plan:   Post-operative Plan:   Informed Consent: I have reviewed the patients History and Physical, chart, labs and discussed the procedure including the risks, benefits and alternatives for the proposed anesthesia with the patient or authorized representative who has indicated his/her understanding and acceptance.       Plan Discussed with:   Anesthesia Plan Comments:         Anesthesia Quick Evaluation

## 2018-11-11 NOTE — Interval H&P Note (Signed)
History and Physical Interval Note:  11/11/2018 9:13 AM  Elizabeth Trujillo  has presented today for surgery, with the diagnosis of LEFT URETERAL CANCER.  The various methods of treatment have been discussed with the patient and family. After consideration of risks, benefits and other options for treatment, the patient has consented to  Procedure(s): CYSTOSCOPY/URETEROSCOPY/HOLMIUM LASER/STENT PLACEMENT (Left) CYSTOSCOPY WITH BLADDER BIOPSY (Left) CYSTOSCOPY WITH FULGERATION (Left) as a surgical intervention.  The patient's history has been reviewed, patient examined, no change in status, stable for surgery.  I have reviewed the patient's chart and labs.  Questions were answered to the patient's satisfaction.    RRR CTAB  Hollice Espy

## 2018-11-11 NOTE — Op Note (Signed)
Date of procedure: 11/11/18  Preoperative diagnosis:  1. History of left urothelial carcinoma 2. CIS of the bladder  Postoperative diagnosis:  1. Same as above  Procedure: 1. Left ureteroscopy 2. Left retrograde pyelogram 3. Left ureteral biopsies 4. Left ureteral stent exchange  Surgeon: Hollice Espy, MD  Anesthesia: General  Complications: None  Intraoperative findings: No obvious viable tumor residual in left distal ureter, previous biopsy sites/laser ablation site with concentric narrowing but no obvious viable tumor.  Several biopsies taken of sloughed off debris within the lumen of the ureter as well as a few biopsies from the wall of the ureter the previous tumor location site.  EBL: Minimal  Specimens: Left distal ureteral biopsies  Drains: 6 x 24 French Bard optima ureteral stent on left  Indication: Elizabeth Trujillo is a 83 y.o. patient with newly diagnosed left distal ureteral urothelial carcinoma, high-grade but presumably noninvasive along with CIS of the bladder.  He is elected to pursue conservative management and returns today for second look ureteroscopy with repeat biopsies.  After reviewing the management options for treatment, she elected to proceed with the above surgical procedure(s). We have discussed the potential benefits and risks of the procedure, side effects of the proposed treatment, the likelihood of the patient achieving the goals of the procedure, and any potential problems that might occur during the procedure or recuperation. Informed consent has been obtained.  Description of procedure:  The patient was taken to the operating room and general anesthesia was induced.  The patient was placed in the dorsal lithotomy position, prepped and draped in the usual sterile fashion, and preoperative antibiotics were administered. A preoperative time-out was performed.   21 [place] scope was advanced per urethra into the bladder.  Attention was turned to left  ureteral orifice which had some surrounding bullous changes but no obvious tumor.  The distal stent coil was grasped using stent graspers and brought to level the urethral meatus.  The stent was then cannulated using a sensor wire up to the level of the kidney.  This was snapped in place as a safety wire.  A 4.5 semirigid ureteroscope was then advanced into the distal ureter where some sloughed off material within the lumen of the ureter was appreciated.  This is partially clot but did also contain some what appeared to be a tissue like material.  As such, Parona forceps were used to grasp several bites of this until he was cleared as biopsy/specimen.  Upon inspection of the distal ureter with a concentric tumor had been identified and ablated using laser, there is some significant narrowing with bullous type change but without obvious papillary lesion appreciated.  Proximal to this, the ureteral mucosa was completely normal all the way up to level the proximal ureter.  No additional obvious tumor was appreciated.  Several additional minuscule biopsies using the Parona forceps were taken at the site at the concentric previous tumor bed which were all sent together as left distal ureteral biopsies.  Hemostasis was adequate.  A final retrograde pyelogram was performed outlining the collecting system which was normal within the kidney.  There was some concentric narrowing concerning for an evolving ureteral stricture within the distal ureter but no extravasation.  Finally, the Bard optima ureteral stent was advanced over the safety wire up to level the renal pelvis.  The wires partially drawn till full coils noted within the renal pelvis as well as within the bladder when the wire was completely removed.  The bladder  was then drained, the patient was then cleaned and dried, repositioned supine position, reversed myesthesia, and taken the PACU in stable condition.  Plan: I will call the patient with her repeat  ureteral biopsy results.  Anticipate this will be either reactive or dysplastic.  We previously discussed management with BCG with a stent in place.  She still agreeable with this plan.  We will go ahead and set her up to begin this in 2 to 3 weeks from now for full 6-week induction course.  We will likely have her return to the operating room within several months of completing BCG to ensure response both in the bladder as well as in the ureter.  We will just that discuss this further at upcoming appointments.  All questions today were answered.  Hollice Espy, M.D.

## 2018-11-11 NOTE — Discharge Instructions (Signed)
Ureteroscopy, Care After °This sheet gives you information about how to care for yourself after your procedure. Your health care provider may also give you more specific instructions. If you have problems or questions, contact your health care provider. °What can I expect after the procedure? °After the procedure, it is common to have: °· A burning sensation when you urinate. °· Blood in your urine. °· Mild discomfort in the bladder area or kidney area when urinating. °· Needing to urinate more often or urgently. °Follow these instructions at home: ° °Medicines °· Take over-the-counter and prescription medicines only as told by your health care provider. °· If you were prescribed an antibiotic medicine, take it as told by your health care provider. Do not stop taking the antibiotic even if you start to feel better. °General instructions °· Do not drive for 24 hours if you were given a medicine to help you relax (sedative) during your procedure. °· To relieve burning, try taking a warm bath or holding a warm washcloth over your groin. °· Drink enough fluid to keep your urine clear or pale yellow. °? Drink two 8-ounce glasses of water every hour for the first 2 hours after you get home. °? Continue to drink water often at home. °· You can eat what you usually do. °· Keep all follow-up visits as told by your health care provider. This is important. °? If you had a tube placed to keep urine flowing (ureteral stent), ask your health care provider when you need to return to have it removed. °Contact a health care provider if: °· You have chills or a fever. °· You have burning pain for longer than 24 hours after the procedure. °· You have blood in your urine for longer than 24 hours after the procedure. °Get help right away if: °· You have large amounts of blood in your urine. °· You have blood clots in your urine. °· You have very bad pain. °· You have chest pain or trouble breathing. °· You are unable to urinate and you  have the feeling of a full bladder. °This information is not intended to replace advice given to you by your health care provider. Make sure you discuss any questions you have with your health care provider. °Document Released: 03/04/2013 Document Revised: 02/09/2017 Document Reviewed: 12/10/2015 °Elsevier Patient Education © 2020 [employer] ° °AMBULATORY SURGERY  °DISCHARGE INSTRUCTIONS ° ° °1) The drugs that you were given will stay in your system until tomorrow so for the next 24 hours you should not: ° °A) Drive an automobile °B) Make any legal decisions °C) Drink any alcoholic beverage ° ° °2) You may resume regular meals tomorrow.  Today it is better to start with liquids and gradually work up to solid foods. ° °You may eat anything you prefer, but it is better to start with liquids, then soup and crackers, and gradually work up to solid foods. ° ° °3) Please notify your doctor immediately if you have any unusual bleeding, trouble breathing, redness and pain at the surgery site, drainage, fever, or pain not relieved by medication. ° ° ° °4) Additional Instructions: ° ° ° ° ° ° ° °Please contact your physician with any problems or Same Day Surgery at [phone], Monday through Friday 6 am to 4 pm, or  at [place] [facility] number at [phone]. ° °

## 2018-11-11 NOTE — Progress Notes (Signed)
HEART rate low to mid 50's  Blood pressure 134/68   Dr Ronelle Nigh aware no new orders

## 2018-11-11 NOTE — Anesthesia Post-op Follow-up Note (Signed)
Anesthesia QCDR form completed.        

## 2018-11-11 NOTE — Anesthesia Postprocedure Evaluation (Signed)
Anesthesia Post Note  [name]  Procedure(s) Performed: CYSTOSCOPY/URETEROSCOPY//STENT EXCHANGE (Left Ureter) BIOPSY (Left Ureter)  Patient location during evaluation: PACU Anesthesia Type: General Level of consciousness: awake and alert Pain management: pain level controlled Vital Signs Assessment: post-procedure vital signs reviewed and stable Respiratory status: spontaneous breathing and respiratory function stable Cardiovascular status: stable Anesthetic complications: no     Last Vitals:  Vitals:   11/11/18 1027 11/11/18 1037  BP: 130/65 134/68  Pulse: (!) 57 (!) 53  Resp: 17 12  Temp:    SpO2: 93% 98%    Last Pain:  Vitals:   11/11/18 1037  TempSrc:   PainSc: 0-No pain                 KEPHART,WILLIAM K

## 2018-11-11 NOTE — Telephone Encounter (Signed)
This patient returned for second look ureteroscopy today and there is no obvious residual tumor.  I like her to get started on BCG in 2 to 3 weeks for an induction course for full 6 weeks.  This is previously discussed in the office and was discussed again today at the time of surgery.  She is agreeable to plan.  Please schedule her for this.  I also like to see her back in the office sometime around mid November about a month after she completes her BCG to discuss booking her in the OR for repeat bladder/ureteral biopsies.  Hollice Espy, MD

## 2018-11-11 NOTE — Anesthesia Procedure Notes (Signed)
Procedure Name: LMA Insertion Date/Time: 11/11/2018 9:37 AM Performed by: Nelda Marseille, CRNA Pre-anesthesia Checklist: Patient identified, Patient being monitored, Timeout performed, Emergency Drugs available and Suction available Patient Re-evaluated:Patient Re-evaluated prior to induction Oxygen Delivery Method: Circle system utilized Preoxygenation: Pre-oxygenation with 100% oxygen Induction Type: IV induction Ventilation: Mask ventilation without difficulty LMA: LMA inserted Tube type: Oral Number of attempts: 1 Placement Confirmation: positive ETCO2 and breath sounds checked- equal and bilateral Tube secured with: Tape Dental Injury: Teeth and Oropharynx as per pre-operative assessment

## 2018-11-11 NOTE — Transfer of Care (Signed)
Immediate Anesthesia Transfer of Care Note  [name]  Procedure(s) Performed: CYSTOSCOPY/URETEROSCOPY/HOLMIUM LASER/STENT EXCHANGE (Left Ureter) BIOPSY (Left Ureter)  Patient Location: PACU  Anesthesia Type:General  Level of Consciousness: awake and oriented  Airway & Oxygen Therapy: Patient Spontanous Breathing and Patient connected to face mask oxygen  Post-op Assessment: Report given to RN and Post -op Vital signs reviewed and stable  Post vital signs: Reviewed and stable  Last Vitals:  Vitals Value Taken Time  BP 130/67 11/11/18 1007  Temp    Pulse 62 11/11/18 1008  Resp 21 11/11/18 1008  SpO2 100 % 11/11/18 1008  Vitals shown include unvalidated device data.  Last Pain:  Vitals:   11/11/18 0835  TempSrc: Temporal  PainSc: 0-No pain         Complications: No apparent anesthesia complications

## 2018-11-12 ENCOUNTER — Ambulatory Visit: Payer: Medicare Other

## 2018-11-12 LAB — SURGICAL PATHOLOGY

## 2018-11-13 ENCOUNTER — Telehealth: Payer: Self-pay | Admitting: Physician Assistant

## 2018-11-13 ENCOUNTER — Telehealth: Payer: Self-pay | Admitting: Family Medicine

## 2018-11-13 ENCOUNTER — Other Ambulatory Visit: Payer: Self-pay

## 2018-11-13 ENCOUNTER — Ambulatory Visit (INDEPENDENT_AMBULATORY_CARE_PROVIDER_SITE_OTHER): Payer: Medicare Other | Admitting: Physician Assistant

## 2018-11-13 ENCOUNTER — Encounter: Payer: Self-pay | Admitting: Physician Assistant

## 2018-11-13 VITALS — BP 95/61 | HR 102 | Temp 98.0°F | Ht 63.0 in | Wt 211.0 lb

## 2018-11-13 DIAGNOSIS — R5082 Postprocedural fever: Secondary | ICD-10-CM

## 2018-11-13 DIAGNOSIS — N390 Urinary tract infection, site not specified: Secondary | ICD-10-CM

## 2018-11-13 LAB — URINALYSIS, COMPLETE
Bilirubin, UA: NEGATIVE
Glucose, UA: NEGATIVE
Ketones, UA: NEGATIVE
Nitrite, UA: POSITIVE — AB
Specific Gravity, UA: 1.015 (ref 1.005–1.030)
Urobilinogen, Ur: 0.2 mg/dL (ref 0.2–1.0)
pH, UA: 5.5 (ref 5.0–7.5)

## 2018-11-13 LAB — MICROSCOPIC EXAMINATION
Epithelial Cells (non renal): >10 /hpf — AB (ref 0–10)
RBC, Urine: >30 /hpf — AB (ref 0–2)
WBC, UA: >30 /hpf — AB (ref 0–5)

## 2018-11-13 MED ORDER — CEFTRIAXONE SODIUM 1 G IJ SOLR
1.0000 g | Freq: Once | INTRAMUSCULAR | Status: DC
  Administered 2018-11-13: 1 g via INTRAMUSCULAR

## 2018-11-13 MED ORDER — CEFTRIAXONE SODIUM 1 G IJ SOLR: 1.0000 g | Freq: Once | INTRAMUSCULAR | 0 refills | Status: AC

## 2018-11-13 MED ORDER — CIPROFLOXACIN HCL 500 MG PO TABS: 500.0000 mg | ORAL_TABLET | Freq: Two times a day (BID) | ORAL | 0 refills | Status: AC

## 2018-11-13 NOTE — Progress Notes (Signed)
11/13/2018 8:07 AM   Elizabeth Trujillo Jan 26, 1936 [id number]  CC: Fever, chills  HPI: Elizabeth Trujillo is a 83 y.o. female who presents today for evaluation of possible UTI. She is POD2 from left ureteroscopy with left retrograde pyelogram, left ureteral biopsies, and left ureteral stent exchange with Dr. Erlene Quan.  History significant for left urothelial carcinoma and CIS of the bladder.  She reports a 1-day history of chills and fever with home temp 101.68F. She also notes fatigue and some gross hematuria. She denies dysuria, frequency, urgency, flank pain, cough, SOB, and confusion. She has taken nothing at home for symptom palliation.  Preoperative urine culture on 10/25/2018 with >100k colonies/mL of multiple species present, recollection suggested.  She received intraoperative ciprofloxacin.  In-office UA today positive for 3+ blood, 3+ protein, nitrites, and 1+ leukocyte esterase; urine microscopy with >30 WBCs/HPF, >30 RBCs/HPF, and >10 epithelial cells.  PMH: Past Medical History:  Diagnosis Date  . Arthritis   . Breast cancer ([place]) 1986   left mastectomy  . Cancer ([place]) 1986   left breast  . Collagen vascular disease ([place])    RA   . Elevated triglycerides with high cholesterol   . GERD (gastroesophageal reflux disease)    history of reflux  . Hypercholesterolemia   . Hypertension   . Stroke [facility])    TIA    Surgical History: Past Surgical History:  Procedure Laterality Date  . ABDOMINAL HYSTERECTOMY  [id number]   TAH/BSO  . BIOPSY Left 11/11/2018   Procedure: BIOPSY;  Surgeon: Hollice Espy, MD;  Location: [facility] ORS;  Service: Urology;  Laterality: Left;  . BREAST BIOPSY Right 01/23/2013   [place] bx/clip-neg  . BREAST SURGERY Left 07/1984   Mastectomy  . callus removed from left toe    . CATARACT EXTRACTION W/ INTRAOCULAR LENS IMPLANT Left 2015  . CYSTOSCOPY WITH URETEROSCOPY AND STENT PLACEMENT Left 10/14/2018   Procedure: CYSTOSCOPY WITH URETEROSCOPY AND STENT  PLACEMENT;  Surgeon: Hollice Espy, MD;  Location: [facility] ORS;  Service: Urology;  Laterality: Left;  . CYSTOSCOPY/URETEROSCOPY/HOLMIUM LASER/STENT PLACEMENT Left 11/11/2018   Procedure: CYSTOSCOPY/URETEROSCOPY//STENT EXCHANGE;  Surgeon: Hollice Espy, MD;  Location: [facility] ORS;  Service: Urology;  Laterality: Left;  . EYE SURGERY Left 2013  . FOOT SURGERY Left 04/2013  . HOLMIUM LASER APPLICATION Left [id number]   Procedure: HOLMIUM LASER APPLICATION;  Surgeon: Hollice Espy, MD;  Location: [facility] ORS;  Service: Urology;  Laterality: Left;  . KNEE ARTHROPLASTY Right 11/03/2015   Procedure: COMPUTER ASSISTED TOTAL KNEE ARTHROPLASTY;  Surgeon: Dereck Leep, MD;  Location: [facility] ORS;  Service: Orthopedics;  Laterality: Right;  . MASTECTOMY Left 1986   positive  . URETERAL BIOPSY Left 10/14/2018   Procedure: URETERAL BIOPSY;  Surgeon: Hollice Espy, MD;  Location: [facility] ORS;  Service: Urology;  Laterality: Left;    Home Medications:  Allergies as of 11/13/2018      Reactions   Penicillin V Potassium Hives   Has patient had a PCN reaction causing immediate rash, facial/tongue/throat swelling, SOB or lightheadedness with hypotension: no Has patient had a PCN reaction causing severe rash involving mucus membranes or skin necrosis: {no Has patient had a PCN reaction that required hospitalization no Has patient had a PCN reaction occurring within the last 10 years: no If all of the above answers are "NO", then may proceed with Cephalosporin use.   Sulfa Antibiotics Hives      Medication List       Accurate as of November 13, 2018 11:59  PM. If you have any questions, ask your nurse or doctor.        acetaminophen 500 MG tablet Commonly known as: TYLENOL Take 500-1,000 mg by mouth every 6 (six) hours as needed (pain.).   amLODipine 5 MG tablet Commonly known as: NORVASC Take 1 tablet (5 mg total) by mouth daily.   aspirin EC 81 MG tablet Take 81 mg by mouth at bedtime.   atorvastatin 40  MG tablet Commonly known as: Lipitor Take 1 tablet (40 mg total) by mouth daily. What changed: when to take this   CALCIUM+D3 PO Take 1 tablet by mouth daily at 2 PM.   cefTRIAXone 1 g injection Commonly known as: ROCEPHIN Inject 1 g into the muscle once for 1 dose. Started by: Debroah Loop, PA-C   cholecalciferol 25 MCG (1000 UT) tablet Commonly known as: VITAMIN D Take 1,000 Units by mouth at bedtime.   ciprofloxacin 500 MG tablet Commonly known as: Cipro Take 1 tablet (500 mg total) by mouth 2 (two) times daily for 7 days. Started by: Debroah Loop, PA-C   clopidogrel 75 MG tablet Commonly known as: Plavix Take 1 tablet (75 mg total) by mouth daily. What changed: when to take this   gabapentin 100 MG capsule Commonly known as: NEURONTIN Take 2 capsules (200 mg total) by mouth 2 (two) times daily.   HYDROcodone-acetaminophen 5-325 MG tablet Commonly known as: NORCO/VICODIN Take 1-2 tablets by mouth every 6 (six) hours as needed for moderate pain.   ketoconazole 2 % shampoo Commonly known as: NIZORAL Apply 1 application topically every three (3) days as needed (flaky/itchy scalp).   levETIRAcetam 250 MG tablet Commonly known as: KEPPRA Take 250 mg by mouth 2 (two) times daily.   multivitamin with minerals Tabs tablet Take 1 tablet by mouth daily at 2 PM.   nystatin cream Commonly known as: MYCOSTATIN Apply 1 application topically 2 (two) times daily. What changed:   when to take this  reasons to take this   tamsulosin 0.4 MG Caps capsule Commonly known as: Flomax Take 1 capsule (0.4 mg total) by mouth daily.   traMADol 50 MG tablet Commonly known as: ULTRAM Take 1 tablet (50 mg total) by mouth 2 (two) times daily. What changed:   when to take this  reasons to take this       Allergies:  Allergies  Allergen Reactions  . Penicillin V Potassium Hives    Has patient had a PCN reaction causing immediate rash, facial/tongue/throat  swelling, SOB or lightheadedness with hypotension: no Has patient had a PCN reaction causing severe rash involving mucus membranes or skin necrosis: {no Has patient had a PCN reaction that required hospitalization no Has patient had a PCN reaction occurring within the last 10 years: no If all of the above answers are "NO", then may proceed with Cephalosporin use.  . Sulfa Antibiotics Hives    Family History: Family History  Problem Relation Age of Onset  . Cancer Mother        baldder and breast  . Glaucoma Mother   . Kidney disease Mother   . Arthritis Mother   . Stroke Mother   . Breast cancer Mother 60  . Heart disease Father   . Cancer Brother        lung cancer, smoker  . Breast cancer Brother   . Breast cancer Cousin        3 mat cousins    Social History:   reports that she has  never smoked. She has never used smokeless tobacco. She reports that she does not drink alcohol or use drugs.  ROS: UROLOGY Frequent Urination?: No Hard to postpone urination?: No Burning/pain with urination?: No Get up at night to urinate?: No Leakage of urine?: No Urine stream starts and stops?: No Trouble starting stream?: No Do you have to strain to urinate?: No Blood in urine?: No Urinary tract infection?: No Sexually transmitted disease?: No Injury to kidneys or bladder?: No Painful intercourse?: No Weak stream?: No Currently pregnant?: No Vaginal bleeding?: No Last menstrual period?: n  Gastrointestinal Nausea?: No Vomiting?: No Indigestion/heartburn?: No Diarrhea?: No Constipation?: No  Constitutional Fever: No Night sweats?: No Weight loss?: No Fatigue?: No  Skin Skin rash/lesions?: No Itching?: No  Eyes Blurred vision?: No Double vision?: No  Ears/Nose/Throat Sore throat?: No Sinus problems?: No  Hematologic/Lymphatic Swollen glands?: No Easy bruising?: No  Cardiovascular Leg swelling?: No Chest pain?: No  Respiratory Cough?: No Shortness of  breath?: No  Endocrine Excessive thirst?: No  Musculoskeletal Back pain?: No Joint pain?: No  Neurological Headaches?: No Dizziness?: No  Psychologic Depression?: No Anxiety?: No  Physical Exam: BP 95/61 (BP Location: Left Arm, Patient Position: Sitting, Cuff Size: Large)   Pulse (!) 102   Temp 98 F (36.7 C)   Ht 5\' 3"  (1.6 m)   Wt 211 lb (95.7 kg)   BMI 37.38 kg/m   Constitutional:  Alert and oriented, no acute distress, flushed facies, no diaphoresis, nontoxic appearing HEENT: [place], AT Cardiovascular: No clubbing, cyanosis, or edema Respiratory: Normal respiratory effort, no increased work of breathing Skin: No rashes, bruises or suspicious lesions Neurologic: Grossly intact, no focal deficits, moving all 4 extremities Psychiatric: Normal mood and affect  Laboratory Data: Results for orders placed or performed in visit on 11/13/18  Microscopic Examination   URINE  Result Value Ref Range   WBC, UA >30 (A) 0 - 5 /hpf   RBC >30 (A) 0 - 2 /hpf   Epithelial Cells (non renal) >10 (A) 0 - 10 /hpf   Renal Epithel, UA 0-10 (A) None seen /hpf   Bacteria, UA Few None seen/Few  Urinalysis, Complete  Result Value Ref Range   Specific Gravity, UA 1.015 1.005 - 1.030   pH, UA 5.5 5.0 - 7.5   Color, UA Yellow Yellow   Appearance Ur Cloudy (A) Clear   Leukocytes,UA 1+ (A) Negative   Protein,UA 3+ (A) Negative/Trace   Glucose, UA Negative Negative   Ketones, UA Negative Negative   RBC, UA 3+ (A) Negative   Bilirubin, UA Negative Negative   Urobilinogen, Ur 0.2 0.2 - 1.0 mg/dL   Nitrite, UA Positive (A) Negative   Microscopic Examination See below:    Assessment & Plan:   1. Postoperative fever Patient presents with fever and chills 2 days following urologic surgery.  UA concerning for infection.  Blood pressure soft at 95/61 and mildly tachycardic to 102.  She is afebrile in the office today.  Patient is nontoxic-appearing and without localized infective symptoms.  Given  her presentation today, I do not believe that she needs inpatient monitoring at this time.  I will proceed with management of her infection on an outpatient basis.  Treating for presumed postoperative UTI.  Urine culture sent.  I administered ceftriaxone 1 g IM in office today and prescribed Cipro 500 mg twice daily x7 days.  I advised her to start this medication today.  I advised her to proceed to the emergency room if  her fever returns or if she does not feel better within 24 hours.  She expressed understanding of this plan. - Urinalysis, Complete - Urine culture - Ceftriaxone 1g IM x1 dose in clinic - Cipro 500mg  BID x7 days  Debroah Loop, [facility]  [place] [address] [place],  [address] [phone]

## 2018-11-13 NOTE — Telephone Encounter (Signed)
Patient called the after hours nurse stating she has a fever of 101.4 and chills. No urinary symptoms. She is concerned because of the bladder cancer. Please advise

## 2018-11-13 NOTE — Telephone Encounter (Signed)
Elizabeth Trujillo from [place] called And would like clarification on the Rx for Rocephin.

## 2018-11-14 ENCOUNTER — Telehealth: Payer: Self-pay | Admitting: Physician Assistant

## 2018-11-14 NOTE — Telephone Encounter (Signed)
I just contacted the patient and spoke with her husband over the phone.  He states that she is continuing to experience fever of 101.2 without any other symptoms.  He says that he has been encouraging her to drink fluids.  She started her prescribed p.o. antibiotics last night and has so far taken 2 doses.  I counseled him to continue to monitor the patient closely over the next 24 hours.  I explained that I expect her to begin to respond to antibiotic therapy within that time.  I counseled him to proceed to the emergency department immediately if she develops a fever greater than 102.  He expressed an understanding of this plan.  Urine culture still pending.

## 2018-11-14 NOTE — Telephone Encounter (Signed)
Pt's husband called and states that she is still running a fever of 101.2 and he would like a call back to discuss options.

## 2018-11-15 ENCOUNTER — Other Ambulatory Visit: Payer: Self-pay | Admitting: Urology

## 2018-11-17 LAB — CULTURE, URINE COMPREHENSIVE

## 2018-11-19 ENCOUNTER — Other Ambulatory Visit: Payer: Self-pay | Admitting: Physician Assistant

## 2018-11-19 ENCOUNTER — Telehealth: Payer: Self-pay | Admitting: Urology

## 2018-11-19 MED ORDER — CEPHALEXIN 500 MG PO CAPS: 500.0000 mg | ORAL_CAPSULE | Freq: Two times a day (BID) | ORAL | 0 refills | Status: AC

## 2018-11-19 NOTE — Telephone Encounter (Signed)
Please schedule for BCG and cysto 56months post

## 2018-11-19 NOTE — Telephone Encounter (Signed)
Left message for patient to call us back.  

## 2018-11-19 NOTE — Telephone Encounter (Signed)
I just spoke with the patient and advised her that I anticipate she will tolerate Keflex well. She received IM ceftriaxone in office last week and tolerated it without problems. No history of anaphylactic reactions to PCNs in the past. She expressed an understanding of this plan and stated she will start the Keflex this afternoon.

## 2018-11-19 NOTE — Progress Notes (Signed)
We will switch oral antibiotics to Keflex 500 mg twice daily x7 days.  Contacted patient.  She reports resolution of fever.  Advised her to switch to this new antibiotic as soon as possible.  Of note, she did not develop any swelling or hives with IM ceftriaxone in the office.  I expect she should tolerate this medication without complication.

## 2018-11-19 NOTE — Telephone Encounter (Signed)
Pt. States the pharmacy called her about a new prescription sent in for her today. Keflex. Pharmacist told patient this medication has penicillin in it and she is allergic to penicillin so she would like clinical to call her and go over this medication choice with her before she starts it.

## 2018-11-19 NOTE — Telephone Encounter (Signed)
[facility] called and said they need someone to call them back to discuss the ABX that was sent in today, they state the patient has a allergic reaction to PCN  Please call Marlowe Kays @ [phone]   Thanks, Sharyn Lull

## 2018-11-19 NOTE — Telephone Encounter (Signed)
Talked with Elizabeth Trujillo at [facility]. Advised her that Sam the PA, spoke with the patient and advised her that I anticipate she will tolerate Keflex well. She received IM ceftriaxone in office last week and tolerated it without problems. No history of anaphylactic reactions to PCNs in the past. She expressed an understanding of this plan and stated she will start the Keflex this afternoon

## 2018-11-20 NOTE — Telephone Encounter (Signed)
Patient has been scheduled for BCG 12-06-18 but she would like for someone to call her to go over the procedure with her when they have time.   Thanks, Sharyn Lull

## 2018-11-20 NOTE — Telephone Encounter (Signed)
Patient instructions for BCG discussed in detail with patient. Her cysto post BCG was rescheduled to the [facility] office per patient request., Will mail a copy of bcg instructions for patient to review

## 2018-11-21 ENCOUNTER — Telehealth: Payer: Self-pay | Admitting: Physician Assistant

## 2018-11-21 NOTE — Telephone Encounter (Signed)
I spoke with the patient over the phone in regard to a question she had for me via telephone earlier this week.  I explained to her that her preoperative urine culture was contaminated and did not reflect a true urinary tract infection, which explains why she did not receive treatment for an infection prior to surgery.  I reminded her that she did receive intraoperative antibiotics as per our protocols.  She expressed an understanding of this.

## 2018-11-27 ENCOUNTER — Other Ambulatory Visit: Payer: Self-pay | Admitting: Physician Assistant

## 2018-11-27 ENCOUNTER — Telehealth: Payer: Self-pay | Admitting: Physician Assistant

## 2018-11-27 MED ORDER — NITROFURANTOIN MONOHYD MACRO 100 MG PO CAPS: 100.0000 mg | ORAL_CAPSULE | Freq: Every day | ORAL | 1 refills | Status: AC

## 2018-11-27 NOTE — Progress Notes (Signed)
Upon discussion with Dr. Erlene Quan, it is been decided to start the patient on nitrofurantoin suppression therapy for 2 months over the duration of her BCG treatments given her history and indwelling stent.  Prescription sent to her pharmacy.  I called the patient and advised her to start these today.  She expressed understanding of this plan.

## 2018-11-27 NOTE — Telephone Encounter (Signed)
Pt states she has questions she would like to discuss about antibiotics, cancer treatment and another UA. Please advise [phone]

## 2018-11-27 NOTE — Telephone Encounter (Signed)
I just spoke with patient via telephone.  She asked if she can proceed with urinalysis today in advance of her first scheduled BCG treatment on 12/06/2018.  She reports resolution of her UTI symptoms and has completed her prescribed antibiotics.  She was not concerned for active infection, however she is concerned that she could have an asymptomatic infection that would delay treatment.  I explained to the patient that we will proceed with urinalysis prior to each BCG instillation.  I explained that it is not indicated to perform a urinalysis if she is not having infective symptoms.  She confirmed that she is experiencing resolution of her prior infective symptoms.  I do not believe a urinalysis is indicated at this time.  I counseled her to call the office again if she develops new dysuria, urgency, or frequency in the interim.  She expressed understanding of this plan.

## 2018-11-28 DIAGNOSIS — M2042 Other hammer toe(s) (acquired), left foot: Secondary | ICD-10-CM | POA: Diagnosis not present

## 2018-11-28 DIAGNOSIS — R4789 Other speech disturbances: Secondary | ICD-10-CM | POA: Diagnosis not present

## 2018-11-28 DIAGNOSIS — R410 Disorientation, unspecified: Secondary | ICD-10-CM | POA: Diagnosis not present

## 2018-11-28 DIAGNOSIS — G5792 Unspecified mononeuropathy of left lower limb: Secondary | ICD-10-CM | POA: Diagnosis not present

## 2018-11-28 DIAGNOSIS — M2041 Other hammer toe(s) (acquired), right foot: Secondary | ICD-10-CM | POA: Diagnosis not present

## 2018-11-28 DIAGNOSIS — M79671 Pain in right foot: Secondary | ICD-10-CM | POA: Diagnosis not present

## 2018-11-28 DIAGNOSIS — I83893 Varicose veins of bilateral lower extremities with other complications: Secondary | ICD-10-CM | POA: Diagnosis not present

## 2018-11-28 DIAGNOSIS — B351 Tinea unguium: Secondary | ICD-10-CM | POA: Diagnosis not present

## 2018-11-28 DIAGNOSIS — R404 Transient alteration of awareness: Secondary | ICD-10-CM | POA: Diagnosis not present

## 2018-11-28 DIAGNOSIS — R262 Difficulty in walking, not elsewhere classified: Secondary | ICD-10-CM | POA: Diagnosis not present

## 2018-11-28 DIAGNOSIS — M069 Rheumatoid arthritis, unspecified: Secondary | ICD-10-CM | POA: Diagnosis not present

## 2018-11-28 DIAGNOSIS — R9082 White matter disease, unspecified: Secondary | ICD-10-CM | POA: Diagnosis not present

## 2018-11-28 DIAGNOSIS — M79672 Pain in left foot: Secondary | ICD-10-CM | POA: Diagnosis not present

## 2018-12-05 ENCOUNTER — Encounter (INDEPENDENT_AMBULATORY_CARE_PROVIDER_SITE_OTHER): Payer: Self-pay

## 2018-12-05 ENCOUNTER — Ambulatory Visit
Admission: RE | Admit: 2018-12-05 | Discharge: 2018-12-05 | Disposition: A | Discharge status: Home / Self Care | Payer: Medicare Other | Source: Ambulatory Visit | Attending: Family Medicine | Admitting: Family Medicine

## 2018-12-05 ENCOUNTER — Other Ambulatory Visit: Payer: Self-pay

## 2018-12-05 DIAGNOSIS — Z1231 Encounter for screening mammogram for malignant neoplasm of breast: Secondary | ICD-10-CM | POA: Diagnosis not present

## 2018-12-06 ENCOUNTER — Ambulatory Visit: Payer: Medicare Other | Admitting: *Deleted

## 2018-12-06 ENCOUNTER — Other Ambulatory Visit: Payer: Self-pay | Admitting: Family Medicine

## 2018-12-06 DIAGNOSIS — N390 Urinary tract infection, site not specified: Secondary | ICD-10-CM

## 2018-12-06 DIAGNOSIS — N6310 Unspecified lump in the right breast, unspecified quadrant: Secondary | ICD-10-CM

## 2018-12-06 DIAGNOSIS — C679 Malignant neoplasm of bladder, unspecified: Secondary | ICD-10-CM | POA: Diagnosis not present

## 2018-12-06 DIAGNOSIS — R928 Other abnormal and inconclusive findings on diagnostic imaging of breast: Secondary | ICD-10-CM

## 2018-12-06 LAB — MICROSCOPIC EXAMINATION: Epithelial Cells (non renal): >10 /hpf — AB (ref 0–10)

## 2018-12-06 LAB — URINALYSIS, COMPLETE
Bilirubin, UA: NEGATIVE
Glucose, UA: NEGATIVE
Ketones, UA: NEGATIVE
Nitrite, UA: NEGATIVE
Protein,UA: NEGATIVE
Specific Gravity, UA: 1.015 (ref 1.005–1.030)
Urobilinogen, Ur: 0.2 mg/dL (ref 0.2–1.0)
pH, UA: 5.5 (ref 5.0–7.5)

## 2018-12-06 NOTE — Progress Notes (Signed)
Patient presented to clinic for BCG 1 treatment. UA showed Moderate bacteria, WBC 11-30 and LEU 2+. Per Lenn Cal, PA send for culture. Patient does have urine frequency, denies other signs or symptoms. Hold off on BCG treatment till culture is resulted and can treat appropriately. Patient verbalized understanding. All questions were answered, ok to continue Macrobid.

## 2018-12-09 ENCOUNTER — Telehealth: Payer: Self-pay | Admitting: Urology

## 2018-12-09 LAB — CULTURE, URINE COMPREHENSIVE

## 2018-12-09 NOTE — Telephone Encounter (Signed)
Pt did U/A and culture last Friday.  She was unable to begin BCG due to having an infection.  She is very anxious about this and waiting to find out about antibiotic.

## 2018-12-09 NOTE — Telephone Encounter (Signed)
Ok to resume BCG

## 2018-12-10 NOTE — Telephone Encounter (Signed)
Left VM to return call 

## 2018-12-10 NOTE — Telephone Encounter (Signed)
Spoke with patient and all questions answered ° ° °Elizabeth Trujillo ° °

## 2018-12-13 ENCOUNTER — Ambulatory Visit (INDEPENDENT_AMBULATORY_CARE_PROVIDER_SITE_OTHER): Payer: Medicare Other | Admitting: *Deleted

## 2018-12-13 ENCOUNTER — Other Ambulatory Visit: Payer: Self-pay

## 2018-12-13 DIAGNOSIS — C679 Malignant neoplasm of bladder, unspecified: Secondary | ICD-10-CM | POA: Diagnosis not present

## 2018-12-13 LAB — URINALYSIS, COMPLETE
Bilirubin, UA: NEGATIVE
Glucose, UA: NEGATIVE
Ketones, UA: NEGATIVE
Leukocytes,UA: NEGATIVE
Nitrite, UA: NEGATIVE
Protein,UA: NEGATIVE
Specific Gravity, UA: 1.025 (ref 1.005–1.030)
Urobilinogen, Ur: 0.2 mg/dL (ref 0.2–1.0)
pH, UA: 5.5 (ref 5.0–7.5)

## 2018-12-13 LAB — MICROSCOPIC EXAMINATION: Bacteria, UA: NONE SEEN

## 2018-12-13 MED ORDER — BCG LIVE 50 MG IS SUSR
3.2400 mL | Freq: Once | INTRAVESICAL | Status: AC
  Administered 2018-12-13: 81 mg via INTRAVESICAL

## 2018-12-13 NOTE — Patient Instructions (Signed)

## 2018-12-13 NOTE — Progress Notes (Signed)
BCG Bladder Instillation  BCG # 1  Due to Bladder Cancer patient is present today for a BCG treatment. Patient was cleaned and prepped in a sterile fashion with betadine and lidocaine 2% jelly was instilled into the urethra.  A 14FR catheter was inserted, urine return was noted 48ml, urine was yellow in color.  64ml of reconstituted BCG was instilled into the bladder. The catheter was then removed. Patient tolerated well, no complications were noted  Patient stayed in the clinic for 30 minutes and rotated each side for 15 minutes each.  Performed by: Verlene Mayer, [place], CMA   Follow up/ Additional notes: One week

## 2018-12-18 ENCOUNTER — Ambulatory Visit
Admission: RE | Admit: 2018-12-18 | Discharge: 2018-12-18 | Disposition: A | Discharge status: Home / Self Care | Payer: Medicare Other | Source: Ambulatory Visit | Attending: Family Medicine | Admitting: Family Medicine

## 2018-12-18 DIAGNOSIS — N6001 Solitary cyst of right breast: Secondary | ICD-10-CM | POA: Diagnosis not present

## 2018-12-18 DIAGNOSIS — R928 Other abnormal and inconclusive findings on diagnostic imaging of breast: Secondary | ICD-10-CM | POA: Insufficient documentation

## 2018-12-18 DIAGNOSIS — N6310 Unspecified lump in the right breast, unspecified quadrant: Secondary | ICD-10-CM | POA: Insufficient documentation

## 2018-12-18 DIAGNOSIS — N6011 Diffuse cystic mastopathy of right breast: Secondary | ICD-10-CM | POA: Diagnosis not present

## 2018-12-20 ENCOUNTER — Other Ambulatory Visit: Payer: Self-pay

## 2018-12-20 ENCOUNTER — Ambulatory Visit (INDEPENDENT_AMBULATORY_CARE_PROVIDER_SITE_OTHER): Payer: Medicare Other | Admitting: *Deleted

## 2018-12-20 DIAGNOSIS — C679 Malignant neoplasm of bladder, unspecified: Secondary | ICD-10-CM | POA: Diagnosis not present

## 2018-12-20 LAB — URINALYSIS, COMPLETE
Bilirubin, UA: NEGATIVE
Glucose, UA: NEGATIVE
Ketones, UA: NEGATIVE
Leukocytes,UA: NEGATIVE
Nitrite, UA: NEGATIVE
Specific Gravity, UA: 1.025 (ref 1.005–1.030)
Urobilinogen, Ur: 0.2 mg/dL (ref 0.2–1.0)
pH, UA: 5.5 (ref 5.0–7.5)

## 2018-12-20 LAB — MICROSCOPIC EXAMINATION

## 2018-12-20 MED ORDER — BCG LIVE 50 MG IS SUSR
3.2400 mL | Freq: Once | INTRAVESICAL | Status: AC
  Administered 2018-12-20: 81 mg via INTRAVESICAL

## 2018-12-20 NOTE — Patient Instructions (Signed)

## 2018-12-20 NOTE — Progress Notes (Signed)
  In and Out Catheterization  Patient is present today for a I & O catheterization due to urine sample. Patient was cleaned and prepped in a sterile fashion with betadine and Lidocaine 2% jelly was instilled into the urethra.  A 14FR cath was inserted no complications were noted , 32ml of urine return was noted, urine was yellow in color. A clean urine sample was collected for UA, Bladder was drained  And catheter was removed with out difficulty.    Performed by: Gaspar Cola  BCG Bladder Instillation  BCG # 2  Due to Bladder Cancer patient is present today for a BCG treatment. Patient was cleaned and prepped in a sterile fashion with betadine and lidocaine 2% jelly was instilled into the urethra.  A 14FR catheter was inserted, urine return was noted 1 ml, urine was Yellow in color.  72ml of reconstituted BCG was instilled into the bladder. The catheter was then removed. Patient tolerated well, no complications were noted  Preformed by: Gaspar Cola CMA , Rebeca Morris CMA  Follow up/ Additional notes: Return in one week

## 2018-12-23 ENCOUNTER — Other Ambulatory Visit: Payer: Self-pay | Admitting: Urology

## 2018-12-23 ENCOUNTER — Telehealth: Payer: Self-pay | Admitting: Urology

## 2018-12-23 NOTE — Telephone Encounter (Signed)
Discussion about Macrobid and Gabapentin.

## 2018-12-23 NOTE — Telephone Encounter (Signed)
She would like to know during her BCG treatments should she still take macrobid and gabapentin. Per your note she is to continue Macrobid during her BCG treatments.

## 2018-12-23 NOTE — Telephone Encounter (Signed)
Patient informed verbalized understanding

## 2018-12-23 NOTE — Telephone Encounter (Signed)
Yes, she can take all meds.  Hollice Espy, MD

## 2018-12-24 DIAGNOSIS — L821 Other seborrheic keratosis: Secondary | ICD-10-CM | POA: Diagnosis not present

## 2018-12-24 DIAGNOSIS — D2272 Melanocytic nevi of left lower limb, including hip: Secondary | ICD-10-CM | POA: Diagnosis not present

## 2018-12-24 DIAGNOSIS — D2271 Melanocytic nevi of right lower limb, including hip: Secondary | ICD-10-CM | POA: Diagnosis not present

## 2018-12-24 DIAGNOSIS — L218 Other seborrheic dermatitis: Secondary | ICD-10-CM | POA: Diagnosis not present

## 2018-12-24 DIAGNOSIS — L304 Erythema intertrigo: Secondary | ICD-10-CM | POA: Diagnosis not present

## 2018-12-24 DIAGNOSIS — D225 Melanocytic nevi of trunk: Secondary | ICD-10-CM | POA: Diagnosis not present

## 2018-12-27 ENCOUNTER — Other Ambulatory Visit: Payer: Self-pay | Admitting: Urology

## 2018-12-27 ENCOUNTER — Other Ambulatory Visit: Payer: Self-pay

## 2018-12-27 ENCOUNTER — Ambulatory Visit (INDEPENDENT_AMBULATORY_CARE_PROVIDER_SITE_OTHER): Payer: Medicare Other

## 2018-12-27 DIAGNOSIS — C679 Malignant neoplasm of bladder, unspecified: Secondary | ICD-10-CM

## 2018-12-27 LAB — URINALYSIS, COMPLETE
Bilirubin, UA: NEGATIVE
Glucose, UA: NEGATIVE
Ketones, UA: NEGATIVE
Nitrite, UA: NEGATIVE
Protein,UA: NEGATIVE
Specific Gravity, UA: 1.02 (ref 1.005–1.030)
Urobilinogen, Ur: 0.2 mg/dL (ref 0.2–1.0)
pH, UA: 5.5 (ref 5.0–7.5)

## 2018-12-27 LAB — MICROSCOPIC EXAMINATION

## 2018-12-27 MED ORDER — BCG LIVE 50 MG IS SUSR
3.2400 mL | Freq: Once | INTRAVESICAL | Status: AC
  Administered 2018-12-27: 81 mg via INTRAVESICAL

## 2018-12-27 NOTE — Progress Notes (Signed)
BCG Bladder Instillation  BCG # 3   Due to Bladder Cancer patient is present today for a BCG treatment. Patient was cleaned and prepped in a sterile fashion with betadine and lidocaine 2% jelly was instilled into the urethra.  A 14 FR catheter was inserted, urine return was noted 136ml, urine was yellow  in color.  64ml of reconstituted BCG was instilled into the bladder. The catheter was then removed. Patient tolerated well, no complications were noted  Preformed by: Gaspar Cola CMA, Fonnie Jarvis CMA   Follow up/ Additional notes: one week.

## 2018-12-27 NOTE — Patient Instructions (Signed)

## 2019-01-03 ENCOUNTER — Encounter: Payer: Self-pay | Admitting: Urology

## 2019-01-03 ENCOUNTER — Other Ambulatory Visit: Payer: Self-pay

## 2019-01-03 ENCOUNTER — Ambulatory Visit (INDEPENDENT_AMBULATORY_CARE_PROVIDER_SITE_OTHER): Payer: Medicare Other | Admitting: Urology

## 2019-01-03 DIAGNOSIS — C679 Malignant neoplasm of bladder, unspecified: Secondary | ICD-10-CM | POA: Diagnosis not present

## 2019-01-03 LAB — URINALYSIS, COMPLETE
Bilirubin, UA: NEGATIVE
Glucose, UA: NEGATIVE
Ketones, UA: NEGATIVE
Leukocytes,UA: NEGATIVE
Nitrite, UA: NEGATIVE
Protein,UA: NEGATIVE
Specific Gravity, UA: 1.025 (ref 1.005–1.030)
Urobilinogen, Ur: 0.2 mg/dL (ref 0.2–1.0)
pH, UA: 5 (ref 5.0–7.5)

## 2019-01-03 LAB — MICROSCOPIC EXAMINATION: RBC, Urine: NONE SEEN /hpf (ref 0–2)

## 2019-01-03 MED ORDER — BCG LIVE 50 MG IS SUSR
3.2400 mL | Freq: Once | INTRAVESICAL | Status: AC
  Administered 2019-01-03: 81 mg via INTRAVESICAL

## 2019-01-03 NOTE — Patient Instructions (Signed)
Patient Education: (BCG) Into the Bladder (Intravesical Chemotherapy)  BCG is a vaccine which is used to prevent tuberculosis (TB).  But it's also a helpful treatment for some early bladder cancers.  When BCG goes directly into the bladder the treatment is described as intravesical.  BCG is a type of immunotherapy.  Immunotherapy stimulates the body's immune system to destroy cancer cells.  How it's given BCG treatment is given to you in an outpatient setting.  It takes a few minutes to administer and you can go home as soon as it's finished.  It might be a good idea to ask someone to bring you, particularly the fist time.  Unlike chemotherapy into the bladder, BCG treatment is never given immediately after surgery to remove bladder tumors.  There needs to be a delay usually of at least two weeks after surgery, before you can have it.  You won't be given treatment with BCG if you are unwell or have an infection in your urine.  You're usually asked to limit the amount you drink before your treatment.  This will help to increase the concentration of BCG in your bladder.  Drinking too much before your treatment may make your bladder feel uncomfortably full.  If you normally take water tablets (diuretics) take them later in the day after your treatment.  Your nurse or doctor will give you more advise about preparing for your treatment.  You will have a small tube (catheter) placed into your bladder.  Your doctor will then put the liquid vaccine directly into your bladder through the catheter and remove the catheter.  You will need to hold your urine for two hours afterwards.  It is best to lie on your stomach for thirty minutes, than on your right or left side for 30 minutes, then switch to your other side for 30 minutes and then onto your back for thirty minutes.  This can be difficult but it's to give the treatment time to work.  You can walk around during this time.  When the treatment is over you can  go to the toilet.  After your treatment there are some precautions you'll need to take.  This is because BCG is a live vaccine and other people shouldn't be exposed to it.  For the next six hours, you'll need to avoid your urine splashing on the toilet seat and getting any urine on your hands.  It might be easer for men to sit down when they're using an ordinary toilet although using a stand up urinal should be alright.  The main this is to avoid splashing urine and spreading the vaccine.  You will also be asked to put 1/2 cup undiluted bleach into the toilet to destroy any live vaccine and leave it for 15 minutes until you flush.  Side Effects Because BCG goes directly into the bladder most of the side effects are linked with the bladder.  They usually go away within one to two days after your treatment.  The most common ones are: -needing to pass urine often -pain when you pass urine -blood in urine -flu-like symptoms (tiredness, general aching and raised temperature)  Theses side effects should settle down within a day or two.  If they don't get better contact your doctor.  Drinking lots of fluids can help flush the drug out of your bladder and reduce some of these effects.  Taking Ibuprofen or Aleve is encouraged unless you have a condition that would make these medications unsafe to  take (renal failure, diabetes, gerd)  Rare side effects can include a continuing high temperature (fever), pain in your joints and a cough.  If you have any of these symptoms, or if you feel generally unwell, contact your doctor.  These symptoms could be a sign of a more serious infection (due to BCG) that needs to be treated immediately.  If this happens you'll be treated with the same drugs (antibiotics) that are used to treat TB.  Contraception Men should use a condom during sex for the first 48 hours after their treatment.  If you are a women who has had BCG treatment then your partner should use a condom.   Using a condom will protect your partner from any vaccine present in your semen or vaginal fluid.  We don't know how BCG may affect a developing fetus so it's not advisable to become pregnant or father a child while having it.  It is important to use effective contraception during your treatment and for six weeks afterwards.  You can discuss this with your doctor or specialist nurse.

## 2019-01-03 NOTE — Progress Notes (Addendum)
BCG Bladder Instillation  BCG # 4 of 6   Due to Bladder Cancer patient is present today for a BCG treatment. Patient was cleaned and prepped in a sterile fashion with betadine and lidocaine 2% jelly was instilled into the urethra.  A 14 FR catheter was inserted, urine return was noted 60 ml, urine was yellow in color.  28ml of reconstituted BCG was instilled into the bladder. The catheter was then removed. Patient tolerated well, no complications were noted  Performed by: Zara Council, PA-C and Gaspar Cola, CMA   Follow up/ Additional notes: Keep follow up BCG next week on 01/10/2019

## 2019-01-07 ENCOUNTER — Ambulatory Visit: Payer: Self-pay | Admitting: Family Medicine

## 2019-01-10 ENCOUNTER — Other Ambulatory Visit: Payer: Self-pay

## 2019-01-10 ENCOUNTER — Encounter: Payer: Self-pay | Admitting: Physician Assistant

## 2019-01-10 ENCOUNTER — Ambulatory Visit (INDEPENDENT_AMBULATORY_CARE_PROVIDER_SITE_OTHER): Payer: Medicare Other | Admitting: Physician Assistant

## 2019-01-10 VITALS — BP 132/72 | HR 86 | Ht 63.0 in | Wt 211.0 lb

## 2019-01-10 DIAGNOSIS — C679 Malignant neoplasm of bladder, unspecified: Secondary | ICD-10-CM

## 2019-01-10 LAB — MICROSCOPIC EXAMINATION

## 2019-01-10 LAB — URINALYSIS, COMPLETE
Bilirubin, UA: NEGATIVE
Glucose, UA: NEGATIVE
Ketones, UA: NEGATIVE
Leukocytes,UA: NEGATIVE
Nitrite, UA: NEGATIVE
Protein,UA: NEGATIVE
Specific Gravity, UA: 1.025 (ref 1.005–1.030)
Urobilinogen, Ur: 0.2 mg/dL (ref 0.2–1.0)
pH, UA: 5 (ref 5.0–7.5)

## 2019-01-10 MED ORDER — BCG LIVE 50 MG IS SUSR
3.2400 mL | Freq: Once | INTRAVESICAL | Status: AC
  Administered 2019-01-10: 81 mg via INTRAVESICAL

## 2019-01-10 NOTE — Progress Notes (Signed)
In and Out Catheterization  Patient is present today for a I & O catheterization for a UA for BCG. Patient was cleaned and prepped in a sterile fashion with betadine and Lidocaine 2% jelly was instilled into the urethra.  A 14FR cath was inserted no complications were noted , 27ml of urine return was noted, urine was yellow in color. A clean urine sample was collected for UA. Bladder was drained  And catheter was removed with out difficulty.    Performed by: Verlene Mayer, CMA  BCG Bladder Instillation  BCG # 5 of 6  Due to Bladder Cancer patient is present today for a BCG treatment. Patient was cleaned and prepped in a sterile fashion with betadine and lidocaine 2% jelly was instilled into the urethra.  A 14FR catheter was inserted, urine return was noted 82ml, urine was clear yellow in color.  57ml of reconstituted BCG was instilled into the bladder. The catheter was then removed. Patient tolerated well, no complications were noted.  Performed by: Debroah Loop, PA-C and Verlene Mayer, CMA  Follow up/ Additional notes: Patient experienced bladder spasm with instillation and voided an indeterminate volume of the bladder instillation. Provided with 1 week of Myrbetriq 25mg  samples to prevent this at her next visit. Return in 1 week for BCG 6 of 6.

## 2019-01-14 DIAGNOSIS — Z23 Encounter for immunization: Secondary | ICD-10-CM | POA: Diagnosis not present

## 2019-01-17 ENCOUNTER — Other Ambulatory Visit: Payer: Self-pay

## 2019-01-17 ENCOUNTER — Ambulatory Visit (INDEPENDENT_AMBULATORY_CARE_PROVIDER_SITE_OTHER): Payer: Medicare Other | Admitting: Physician Assistant

## 2019-01-17 VITALS — BP 121/80 | HR 81 | Ht 63.0 in | Wt 211.0 lb

## 2019-01-17 DIAGNOSIS — C679 Malignant neoplasm of bladder, unspecified: Secondary | ICD-10-CM

## 2019-01-17 LAB — URINALYSIS, COMPLETE
Bilirubin, UA: NEGATIVE
Glucose, UA: NEGATIVE
Ketones, UA: NEGATIVE
Leukocytes,UA: NEGATIVE
Nitrite, UA: NEGATIVE
Specific Gravity, UA: >1.03 — ABNORMAL HIGH (ref 1.005–1.030)
Urobilinogen, Ur: 0.2 mg/dL (ref 0.2–1.0)
pH, UA: 5.5 (ref 5.0–7.5)

## 2019-01-17 LAB — MICROSCOPIC EXAMINATION: Bacteria, UA: NONE SEEN

## 2019-01-17 MED ORDER — BCG LIVE 50 MG IS SUSR
3.2400 mL | Freq: Once | INTRAVESICAL | Status: AC
  Administered 2019-01-17: 81 mg via INTRAVESICAL

## 2019-01-17 NOTE — Progress Notes (Signed)
BCG Bladder Instillation  BCG # 6 of 6  Due to Bladder Cancer patient is present today for a BCG treatment. Patient was cleaned and prepped in a sterile fashion with betadine and lidocaine 2% jelly was instilled into the urethra.  A 14FR catheter was inserted, urine return was noted 31ml, urine was yellow in color.  80ml of reconstituted BCG was instilled into the bladder. The catheter was then removed. Patient tolerated well, no complications were noted  Performed by: Debroah Loop, PA-C and Daryel Gerald, CMA  Follow up/ Additional notes: Patient reports continued occasional bladder spasms. I provided her with an additional 2 weeks of Myrbetriq 25mg  samples for management of these, which I suspect are associated with her BCG treatments. She was also noted to have beefy red, erythematous tissue of the labia majora extending into the vaginal introitus with satellite lesions.  She denies pain or pruritus associated with this, however she does have a PMH of yeast infections.  I advised her to use an over-the-counter yeast infection treatment for management of this.   Patient to follow-up with cystoscopy with Dr. Erlene Quan in 1 month.  I spent 15 min with this patient, of which greater than 50% was spent in counseling and coordination of care with the patient.   Debroah Loop, PA-C 01/17/19 12:04 PM

## 2019-01-17 NOTE — Patient Instructions (Signed)
1. Go purchase an over-the-counter treatment for yeast infections. I recommend Monistat brand. Start this tomorrow evening for treatment of your infection. 2. Continue your macrobid. 3. Continue Myrbetriq 25mg  daily for two more weeks. I do not anticipate you will need this medication any longer than that. 4. We have moved your cystoscopy with Dr. Erlene Quan next week to one month from now.

## 2019-01-21 ENCOUNTER — Other Ambulatory Visit: Payer: Medicare Other | Admitting: Urology

## 2019-01-22 ENCOUNTER — Other Ambulatory Visit: Payer: Self-pay | Admitting: Radiology

## 2019-01-22 ENCOUNTER — Telehealth: Payer: Self-pay | Admitting: Radiology

## 2019-01-22 ENCOUNTER — Other Ambulatory Visit: Payer: Self-pay | Admitting: Physician Assistant

## 2019-01-22 DIAGNOSIS — C679 Malignant neoplasm of bladder, unspecified: Secondary | ICD-10-CM

## 2019-01-22 DIAGNOSIS — Z01818 Encounter for other preprocedural examination: Secondary | ICD-10-CM

## 2019-01-22 DIAGNOSIS — N390 Urinary tract infection, site not specified: Secondary | ICD-10-CM

## 2019-01-22 NOTE — Telephone Encounter (Signed)
No more Macrobid.  Yes, ideally stop aspirin prior to surgery.

## 2019-01-22 NOTE — Telephone Encounter (Signed)
Made patient aware of No more Macrobid. Stop aspirin prior to surgery. Patient expresses understanding of instructions.

## 2019-01-22 NOTE — Telephone Encounter (Signed)
Patient asks if she should continue taking Macrobid prior to surgery? She will finish current prescription this week. Also, should she stop aspirin prior to surgery? She is no longer taking Plavix.

## 2019-01-23 ENCOUNTER — Telehealth: Payer: Self-pay | Admitting: Urology

## 2019-01-23 ENCOUNTER — Telehealth: Payer: Self-pay

## 2019-01-23 DIAGNOSIS — M1990 Unspecified osteoarthritis, unspecified site: Secondary | ICD-10-CM

## 2019-01-23 DIAGNOSIS — Z780 Asymptomatic menopausal state: Secondary | ICD-10-CM

## 2019-01-23 NOTE — Telephone Encounter (Signed)
1. Pt stated that she had her flu shot on 01/14/2019 at [facility] and she would like that update on her chart. Pt is going to contact [facility] and request they send over the info.  2. Pt stated that she is supposed to have a bone density done every 2 years and her last was Oct 2017. Pt request and order be sent to [facility].  3. Pt would like a call back to discuss what vaccines she needs to get to be up to date.  4. I had to rescheduled pt's 02/05/2019 appt b/c Dr. Rosanna Randy is out of the office. Pt is having an eye procedure in the coming weeks and then Christmas, she requested to push the visit out until Jan 2021. Pt wanted to makes sure that was ok with Dr. Rosanna Randy. Please advise. Thanks TNP

## 2019-01-23 NOTE — Telephone Encounter (Signed)
LM for PT to call back to try to schedule genetics appt-ltg

## 2019-01-24 ENCOUNTER — Other Ambulatory Visit: Payer: Self-pay | Admitting: Urology

## 2019-01-24 NOTE — Telephone Encounter (Signed)
[place] for BMD--will discuss vaccines in future visits.

## 2019-01-24 NOTE — Telephone Encounter (Signed)
Called patient and lvm about the appointment date change and the order being placed for the Bone Density and Dr. Rosanna Randy will discuss Vaccines at her upcoming visit.

## 2019-01-24 NOTE — Telephone Encounter (Signed)
Please advise ordering bone density?

## 2019-02-03 DIAGNOSIS — B351 Tinea unguium: Secondary | ICD-10-CM | POA: Diagnosis not present

## 2019-02-03 DIAGNOSIS — M79671 Pain in right foot: Secondary | ICD-10-CM | POA: Diagnosis not present

## 2019-02-03 DIAGNOSIS — M2042 Other hammer toe(s) (acquired), left foot: Secondary | ICD-10-CM | POA: Diagnosis not present

## 2019-02-03 DIAGNOSIS — M2041 Other hammer toe(s) (acquired), right foot: Secondary | ICD-10-CM | POA: Diagnosis not present

## 2019-02-03 DIAGNOSIS — M79672 Pain in left foot: Secondary | ICD-10-CM | POA: Diagnosis not present

## 2019-02-05 ENCOUNTER — Ambulatory Visit: Payer: Medicare Other | Admitting: Family Medicine

## 2019-02-14 ENCOUNTER — Other Ambulatory Visit
Admission: RE | Admit: 2019-02-14 | Discharge: 2019-02-14 | Disposition: A | Discharge status: Home / Self Care | Payer: Medicare Other | Attending: Urology | Admitting: Urology

## 2019-02-14 ENCOUNTER — Other Ambulatory Visit: Payer: Self-pay

## 2019-02-14 ENCOUNTER — Other Ambulatory Visit: Payer: Self-pay | Admitting: Radiology

## 2019-02-14 ENCOUNTER — Telehealth: Payer: Self-pay | Admitting: Radiology

## 2019-02-14 DIAGNOSIS — Z01818 Encounter for other preprocedural examination: Secondary | ICD-10-CM | POA: Diagnosis not present

## 2019-02-14 DIAGNOSIS — N390 Urinary tract infection, site not specified: Secondary | ICD-10-CM | POA: Diagnosis not present

## 2019-02-14 DIAGNOSIS — C679 Malignant neoplasm of bladder, unspecified: Secondary | ICD-10-CM | POA: Diagnosis not present

## 2019-02-14 LAB — URINALYSIS, COMPLETE (UACMP) WITH MICROSCOPIC
Bilirubin Urine: NEGATIVE
Glucose, UA: NEGATIVE mg/dL
Nitrite: NEGATIVE
Protein, ur: 100 mg/dL — AB
RBC / HPF: >50 RBC/hpf (ref 0–5)
Specific Gravity, Urine: 1.02 (ref 1.005–1.030)
pH: 5.5 (ref 5.0–8.0)

## 2019-02-14 NOTE — Telephone Encounter (Signed)
If she is improving with nystatin cream, that is good news.  I have just continue to use this as recommended on the packaging.  If things are to worsen, please prescribe her Diflucan 150 mg x 1.

## 2019-02-14 NOTE — Telephone Encounter (Signed)
Patient reports what she believes is a yeast infection including itching & redness. She has been using nystatin cream which has improved symptoms somewhat. Please advise.

## 2019-02-14 NOTE — Telephone Encounter (Signed)
Advised patient to follow Dr Cherrie Gauze recommendations and to call back if symptoms worsen. Patient expresses understanding of instructions.

## 2019-02-16 LAB — URINE CULTURE

## 2019-02-18 ENCOUNTER — Other Ambulatory Visit: Payer: Self-pay

## 2019-02-18 DIAGNOSIS — C679 Malignant neoplasm of bladder, unspecified: Secondary | ICD-10-CM

## 2019-02-21 ENCOUNTER — Other Ambulatory Visit: Payer: Self-pay

## 2019-02-21 ENCOUNTER — Encounter: Payer: Self-pay | Admitting: Urology

## 2019-02-21 ENCOUNTER — Encounter
Admission: RE | Admit: 2019-02-21 | Discharge: 2019-02-21 | Disposition: A | Discharge status: Home / Self Care | Payer: Medicare Other | Source: Ambulatory Visit | Attending: Urology | Admitting: Urology

## 2019-02-21 ENCOUNTER — Telehealth (INDEPENDENT_AMBULATORY_CARE_PROVIDER_SITE_OTHER): Payer: Medicare Other | Admitting: Urology

## 2019-02-21 DIAGNOSIS — Z01812 Encounter for preprocedural laboratory examination: Secondary | ICD-10-CM | POA: Insufficient documentation

## 2019-02-21 DIAGNOSIS — Z20828 Contact with and (suspected) exposure to other viral communicable diseases: Secondary | ICD-10-CM | POA: Diagnosis not present

## 2019-02-21 DIAGNOSIS — C662 Malignant neoplasm of left ureter: Secondary | ICD-10-CM | POA: Diagnosis not present

## 2019-02-21 HISTORY — DX: Unspecified convulsions: R56.9

## 2019-02-21 NOTE — H&P (View-Only) (Signed)
Virtual Visit via Telephone Note  I connected with Elizabeth Trujillo on 02/21/19 at 10:30 AM EST by telephone and verified that I am speaking with the correct person using two identifiers.  Location: Patient: home with huband on speaker phone Provider: office   I discussed the limitations, risks, security and privacy concerns of performing an evaluation and management service by telephone and the availability of in person appointments. I also discussed with the patient that there may be a patient responsible charge related to this service. The patient expressed understanding and agreed to proceed.  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  She is now completed BCG x6.  As per previous discussion, she is scheduled early next week for surveillance ureteroscopy with repeat biopsies of the ureter as well as the bladder, possible stent exchange, retrograde pyelogram and other intervention as deemed necessary intraoperatively.  She had some difficulty tolerating BCG.  She was placed on suppressive antibiotics, Macrobid during the course.  She mentions today that she has yeast in her labial folds as well as in her abdominal fat pad.  She has been managing this locally with powder and nystatin cream which seems to be helping.  Her urinary symptoms are at baseline with intermittent urinary urgency frequency.  She is accompanied by telephone with her husband today.  Preoperative urine culture last week nonclonal.  She is going for her Covid testing today.  She was referred to genetics at this appointment is pending.  She is holding her aspirin periprocedurally.   Observations/Objective: Pleasant, interactive, husband also present today.  Assessment and Plan:  1. Urothelial carcinoma of left distal  ureter ([place]) Status post induction course of BCG with stent in place  Lengthy discussion above regarding plans for surveillance ureteroscopy with biopsies both of the ureter and bladder along with stent exchange.  Risk and benefits of this were discussed in detail good risk of bleeding, infection, damage surrounding structures, amongst others.  All questions answered.  Depending on the pathology, if there is no residual malignancy, will follow her extremely closely with serial ureteroscopy and imaging.  If there is persistent tumor, will likely refer her for second opinion at [facility].  She is previously not been interested in nephro ureterectomy or chemotherapy.  She may be a good candidate for clinical trial including treatment such as mitomycin gel in the upper tract ureter.  We discussed this briefly today as an option moving forward we will continue this discussion based on intraoperative findings and pathology results.  Discussion today was lengthy.  She had lots of questions, some of which have been answered previously.  Follow Up Instructions: Surgery scheduled Monday   I discussed the assessment and treatment plan with the patient. The patient was provided an opportunity to ask questions and all were answered. The patient agreed with the plan and demonstrated an understanding of the instructions.   The patient was advised to call back or seek an in-person evaluation if the symptoms worsen or if the condition fails to improve as anticipated.  I provided 16  minutes of non-face-to-face time during this encounter.   Hollice Espy, MD

## 2019-02-21 NOTE — Patient Instructions (Addendum)
Your procedure is scheduled on: 02-24-19 MONDAY Report to Same Day Surgery 2nd floor medical mall [facility] Entrance-take elevator on left to 2nd floor.  Check in with surgery information desk.) @ 7:30 AM  Remember: Instructions that are not followed completely may result in serious medical risk, up to and including death, or upon the discretion of your surgeon and anesthesiologist your surgery may need to be rescheduled.    _x___ 1. Do not eat food after midnight the night before your procedure. NO GUM OR CANDY AFTER MIDNIGHT. You may drink clear liquids up to 2 hours before you are scheduled to arrive at the hospital for your procedure.  Do not drink clear liquids within 2 hours of your scheduled arrival to the hospital.  Clear liquids include  --Water or Apple juice without pulp  --Gatorade  --Black Coffee or Clear Tea (No milk, no creamers, do not add anything to the coffee or Tea   ____Ensure clear carbohydrate drink on the way to the hospital for bariatric patients  ____Ensure clear carbohydrate drink 3 hours before surgery.    __x__ 2. No Alcohol for 24 hours before or after surgery.   __x__3. No Smoking or e-cigarettes for 24 prior to surgery.  Do not use any chewable tobacco products for at least 6 hour prior to surgery   ____  4. Bring all medications with you on the day of surgery if instructed.    __x__ 5. Notify your doctor if there is any change in your medical condition     (cold, fever, infections).    x___6. On the morning of surgery brush your teeth with toothpaste and water.  You may rinse your mouth with mouth wash if you wish.  Do not swallow any toothpaste or mouthwash.   Do not wear jewelry, make-up, hairpins, clips or nail polish.  Do not wear lotions, powders, or perfumes. You may wear deodorant.  Do not shave 48 hours prior to surgery. Men may shave face and neck.  Do not bring valuables to the hospital.    [facility] is not responsible for any  belongings or valuables.               Contacts, dentures or bridgework may not be worn into surgery.  Leave your suitcase in the car. After surgery it may be brought to your room.  For patients admitted to the hospital, discharge time is determined by your  treatment team.  _  Patients discharged the day of surgery will not be allowed to drive home.  You will need someone to drive you home and stay with you the night of your procedure.    Please read over the following fact sheets that you were given:   [facility] Preparing for Surgery   _x___ TAKE THE FOLLOWING MEDICATION THE MORNING OF SURGERY WITH A SMALL SIP OF WATER. These include:  1. KEPPRA  2.  3.  4.  5.  6.  ____Fleets enema or Magnesium Citrate as directed.   ____ Use CHG Soap or sage wipes as directed on instruction sheet   ____ Use inhalers on the day of surgery and bring to hospital day of surgery  ____ Stop Metformin and Janumet 2 days prior to surgery.    ____ Take 1/2 of usual insulin dose the night before surgery and none on the morning surgery.   _X___ Follow recommendations from Cardiologist, Pulmonologist or PCP regarding stopping Aspirin, Coumadin, Plavix ,Eliquis, Effient, or Pradaxa, and Pletal-PT HAS  ALREADY STOPPED HER ASPIRIN PER HER SURGEON  X____Stop Anti-inflammatories such as Advil, Aleve, Ibuprofen, Motrin, Naproxen, Naprosyn, Goodies powders or aspirin products NOW-OK to take Tylenol   ____ Stop supplements until after surgery.    ____ Bring C-Pap to the hospital.

## 2019-02-21 NOTE — Progress Notes (Signed)
Virtual Visit via Telephone Note  I connected with Elizabeth Trujillo on 02/21/19 at 10:30 AM EST by telephone and verified that I am speaking with the correct person using two identifiers.  Location: Patient: home with huband on speaker phone Provider: office   I discussed the limitations, risks, security and privacy concerns of performing an evaluation and management service by telephone and the availability of in person appointments. I also discussed with the patient that there may be a patient responsible charge related to this service. The patient expressed understanding and agreed to proceed.  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  She is now completed BCG x6.  As per previous discussion, she is scheduled early next week for surveillance ureteroscopy with repeat biopsies of the ureter as well as the bladder, possible stent exchange, retrograde pyelogram and other intervention as deemed necessary intraoperatively.  She had some difficulty tolerating BCG.  She was placed on suppressive antibiotics, Macrobid during the course.  She mentions today that she has yeast in her labial folds as well as in her abdominal fat pad.  She has been managing this locally with powder and nystatin cream which seems to be helping.  Her urinary symptoms are at baseline with intermittent urinary urgency frequency.  She is accompanied by telephone with her husband today.  Preoperative urine culture last week nonclonal.  She is going for her Covid testing today.  She was referred to genetics at this appointment is pending.  She is holding her aspirin periprocedurally.   Observations/Objective: Pleasant, interactive, husband also present today.  Assessment and Plan:  1. Urothelial carcinoma of left distal  ureter ([place]) Status post induction course of BCG with stent in place  Lengthy discussion above regarding plans for surveillance ureteroscopy with biopsies both of the ureter and bladder along with stent exchange.  Risk and benefits of this were discussed in detail good risk of bleeding, infection, damage surrounding structures, amongst others.  All questions answered.  Depending on the pathology, if there is no residual malignancy, will follow her extremely closely with serial ureteroscopy and imaging.  If there is persistent tumor, will likely refer her for second opinion at [facility].  She is previously not been interested in nephro ureterectomy or chemotherapy.  She may be a good candidate for clinical trial including treatment such as mitomycin gel in the upper tract ureter.  We discussed this briefly today as an option moving forward we will continue this discussion based on intraoperative findings and pathology results.  Discussion today was lengthy.  She had lots of questions, some of which have been answered previously.  Follow Up Instructions: Surgery scheduled Monday   I discussed the assessment and treatment plan with the patient. The patient was provided an opportunity to ask questions and all were answered. The patient agreed with the plan and demonstrated an understanding of the instructions.   The patient was advised to call back or seek an in-person evaluation if the symptoms worsen or if the condition fails to improve as anticipated.  I provided 16  minutes of non-face-to-face time during this encounter.   Hollice Espy, MD

## 2019-02-22 LAB — SARS CORONAVIRUS 2 (TAT 6-24 HRS): SARS Coronavirus 2: NEGATIVE

## 2019-02-24 ENCOUNTER — Encounter
Admission: RE | Disposition: A | Discharge status: Home / Self Care | Payer: Self-pay | Source: Home / Self Care | Attending: Urology

## 2019-02-24 ENCOUNTER — Other Ambulatory Visit: Payer: Self-pay

## 2019-02-24 ENCOUNTER — Ambulatory Visit: Payer: Medicare Other | Admitting: Anesthesiology

## 2019-02-24 ENCOUNTER — Encounter: Payer: Self-pay | Admitting: Urology

## 2019-02-24 ENCOUNTER — Ambulatory Visit
Admission: RE | Admit: 2019-02-24 | Discharge: 2019-02-24 | Disposition: A | Discharge status: Home / Self Care | Payer: Medicare Other | Attending: Urology | Admitting: Urology

## 2019-02-24 ENCOUNTER — Ambulatory Visit: Payer: Medicare Other

## 2019-02-24 DIAGNOSIS — R569 Unspecified convulsions: Secondary | ICD-10-CM | POA: Insufficient documentation

## 2019-02-24 DIAGNOSIS — C679 Malignant neoplasm of bladder, unspecified: Secondary | ICD-10-CM | POA: Diagnosis not present

## 2019-02-24 DIAGNOSIS — Z8554 Personal history of malignant neoplasm of ureter: Secondary | ICD-10-CM

## 2019-02-24 DIAGNOSIS — M1990 Unspecified osteoarthritis, unspecified site: Secondary | ICD-10-CM | POA: Diagnosis not present

## 2019-02-24 DIAGNOSIS — I10 Essential (primary) hypertension: Secondary | ICD-10-CM | POA: Insufficient documentation

## 2019-02-24 DIAGNOSIS — N3020 Other chronic cystitis without hematuria: Secondary | ICD-10-CM | POA: Insufficient documentation

## 2019-02-24 DIAGNOSIS — C662 Malignant neoplasm of left ureter: Secondary | ICD-10-CM

## 2019-02-24 DIAGNOSIS — N136 Pyonephrosis: Secondary | ICD-10-CM | POA: Insufficient documentation

## 2019-02-24 DIAGNOSIS — Z8673 Personal history of transient ischemic attack (TIA), and cerebral infarction without residual deficits: Secondary | ICD-10-CM | POA: Diagnosis not present

## 2019-02-24 DIAGNOSIS — Z86008 Personal history of in-situ neoplasm of other site: Secondary | ICD-10-CM | POA: Diagnosis not present

## 2019-02-24 DIAGNOSIS — D4122 Neoplasm of uncertain behavior of left ureter: Secondary | ICD-10-CM

## 2019-02-24 DIAGNOSIS — E7800 Pure hypercholesterolemia, unspecified: Secondary | ICD-10-CM | POA: Diagnosis not present

## 2019-02-24 DIAGNOSIS — N1330 Unspecified hydronephrosis: Secondary | ICD-10-CM | POA: Diagnosis not present

## 2019-02-24 DIAGNOSIS — N3000 Acute cystitis without hematuria: Secondary | ICD-10-CM | POA: Diagnosis not present

## 2019-02-24 DIAGNOSIS — Z853 Personal history of malignant neoplasm of breast: Secondary | ICD-10-CM | POA: Insufficient documentation

## 2019-02-24 DIAGNOSIS — Z8551 Personal history of malignant neoplasm of bladder: Secondary | ICD-10-CM | POA: Diagnosis not present

## 2019-02-24 HISTORY — PX: URETERAL BIOPSY: SHX6688

## 2019-02-24 HISTORY — PX: CYSTOSCOPY W/ RETROGRADES: SHX1426

## 2019-02-24 HISTORY — PX: CYSTOSCOPY WITH FULGERATION: SHX6638

## 2019-02-24 HISTORY — PX: CYSTOSCOPY WITH URETEROSCOPY AND STENT PLACEMENT: SHX6377

## 2019-02-24 HISTORY — PX: CYSTOSCOPY WITH BIOPSY: SHX5122

## 2019-02-24 SURGERY — CYSTOURETEROSCOPY, WITH STENT INSERTION
Anesthesia: General | Site: Ureter

## 2019-02-24 MED ORDER — FENTANYL CITRATE (PF) 100 MCG/2ML IJ SOLN
25.0000 ug | INTRAMUSCULAR | Status: DC | PRN
  Administered 2019-02-24 (×2): 25 ug via INTRAVENOUS

## 2019-02-24 MED ORDER — FAMOTIDINE 20 MG PO TABS
ORAL_TABLET | ORAL | Status: AC
  Administered 2019-02-24: 20 mg via ORAL
  Filled 2019-02-24: qty 1

## 2019-02-24 MED ORDER — OXYCODONE HCL 5 MG PO TABS: 5.0000 mg | ORAL_TABLET | Freq: Once | ORAL | Status: DC | PRN

## 2019-02-24 MED ORDER — BELLADONNA ALKALOIDS-OPIUM 16.2-60 MG RE SUPP
RECTAL | Status: AC
  Filled 2019-02-24: qty 1

## 2019-02-24 MED ORDER — SUGAMMADEX SODIUM 200 MG/2ML IV SOLN
INTRAVENOUS | Status: AC
  Filled 2019-02-24: qty 2

## 2019-02-24 MED ORDER — FAMOTIDINE 20 MG PO TABS: 20.0000 mg | ORAL_TABLET | Freq: Once | ORAL | Status: AC

## 2019-02-24 MED ORDER — OXYCODONE HCL 5 MG/5ML PO SOLN: 5.0000 mg | Freq: Once | ORAL | Status: DC | PRN

## 2019-02-24 MED ORDER — FENTANYL CITRATE (PF) 100 MCG/2ML IJ SOLN
INTRAMUSCULAR | Status: AC
  Filled 2019-02-24: qty 2

## 2019-02-24 MED ORDER — ONDANSETRON HCL 4 MG/2ML IJ SOLN
INTRAMUSCULAR | Status: DC | PRN
  Administered 2019-02-24: 4 mg via INTRAVENOUS

## 2019-02-24 MED ORDER — FENTANYL CITRATE (PF) 100 MCG/2ML IJ SOLN
INTRAMUSCULAR | Status: DC | PRN
  Administered 2019-02-24 (×4): 25 ug via INTRAVENOUS

## 2019-02-24 MED ORDER — GENTAMICIN SULFATE 40 MG/ML IJ SOLN
500.0000 mg | Freq: Once | INTRAVENOUS | Status: AC
  Administered 2019-02-24: 500 mg via INTRAVENOUS
  Filled 2019-02-24: qty 12.5

## 2019-02-24 MED ORDER — BELLADONNA ALKALOIDS-OPIUM 16.2-60 MG RE SUPP
1.0000 | Freq: Once | RECTAL | Status: AC
  Administered 2019-02-24: 1 via RECTAL

## 2019-02-24 MED ORDER — LACTATED RINGERS IV SOLN
INTRAVENOUS | Status: DC
  Administered 2019-02-24: 09:00:00 via INTRAVENOUS

## 2019-02-24 MED ORDER — FENTANYL CITRATE (PF) 100 MCG/2ML IJ SOLN
INTRAMUSCULAR | Status: AC
  Administered 2019-02-24: 25 ug via INTRAVENOUS
  Filled 2019-02-24: qty 2

## 2019-02-24 MED ORDER — PROPOFOL 10 MG/ML IV BOLUS
INTRAVENOUS | Status: DC | PRN
  Administered 2019-02-24: 120 mg via INTRAVENOUS

## 2019-02-24 MED ORDER — DEXAMETHASONE SODIUM PHOSPHATE 10 MG/ML IJ SOLN
INTRAMUSCULAR | Status: DC | PRN
  Administered 2019-02-24: 5 mg via INTRAVENOUS

## 2019-02-24 MED ORDER — LIDOCAINE HCL (CARDIAC) PF 100 MG/5ML IV SOSY
PREFILLED_SYRINGE | INTRAVENOUS | Status: DC | PRN
  Administered 2019-02-24: 80 mg via INTRAVENOUS

## 2019-02-24 SURGICAL SUPPLY — 23 items
BAG DRAIN CYSTO-URO LG1000N (MISCELLANEOUS) ×3 IMPLANT
BRUSH SCRUB EZ  4% CHG (MISCELLANEOUS) ×1
BRUSH SCRUB EZ 4% CHG (MISCELLANEOUS) ×2 IMPLANT
COVER WAND RF STERILE (DRAPES) ×3 IMPLANT
DRSG TELFA 4X3 1S NADH ST (GAUZE/BANDAGES/DRESSINGS) ×3 IMPLANT
ELECT REM PT RETURN 9FT ADLT (ELECTROSURGICAL) ×3
ELECTRODE REM PT RTRN 9FT ADLT (ELECTROSURGICAL) ×2 IMPLANT
FORCEPS BIOP PIRANHA Y (CUTTING FORCEPS) ×1 IMPLANT
GLOVE BIO SURGEON STRL SZ 6.5 (GLOVE) ×3 IMPLANT
GOWN STRL REUS W/ TWL LRG LVL3 (GOWN DISPOSABLE) ×4 IMPLANT
GOWN STRL REUS W/TWL LRG LVL3 (GOWN DISPOSABLE) ×6
GUIDEWIRE STR DUAL SENSOR (WIRE) ×3 IMPLANT
INFUSOR MANOMETER BAG 3000ML (MISCELLANEOUS) ×3 IMPLANT
KIT TURNOVER CYSTO (KITS) ×3 IMPLANT
NDL SAFETY ECLIPSE 18X1.5 (NEEDLE) ×2 IMPLANT
NEEDLE HYPO 18GX1.5 SHARP (NEEDLE)
PACK CYSTO AR (MISCELLANEOUS) ×3 IMPLANT
SET CYSTO W/LG BORE CLAMP LF (SET/KITS/TRAYS/PACK) ×3 IMPLANT
STENT URET 6FRX24 CONTOUR (STENTS) ×1 IMPLANT
SURGILUBE 2OZ TUBE FLIPTOP (MISCELLANEOUS) ×3 IMPLANT
WATER STERILE IRR 1000ML POUR (IV SOLUTION) ×3 IMPLANT
WATER STERILE IRR 3000ML UROMA (IV SOLUTION) ×3 IMPLANT
WIRE AMPLATZ SSTIFF .035X260CM (WIRE) ×1 IMPLANT

## 2019-02-24 NOTE — Discharge Instructions (Signed)
Transurethral Resection of Bladder Tumor (TURBT) or Bladder Biopsy ° ° °Definition: ° Transurethral Resection of the Bladder Tumor is a surgical procedure used to diagnose and remove tumors within the bladder. TURBT is the most common treatment for early stage bladder cancer. ° °General instructions: °   ° Your recent bladder surgery requires very little post hospital care but some definite precautions. ° °Despite the fact that no skin incisions were used, the area around the bladder incisions are raw and covered with scabs to promote healing and prevent bleeding. Certain precautions are needed to insure that the scabs are not disturbed over the next 2-4 weeks while the healing proceeds. ° °Because the raw surface inside your bladder and the irritating effects of urine you may expect frequency of urination and/or urgency (a stronger desire to urinate) and perhaps even getting up at night more often. This will usually resolve or improve slowly over the healing period. You may see some blood in your urine over the first 6 weeks. Do not be alarmed, even if the urine was clear for a while. Get off your feet and drink lots of fluids until clearing occurs. If you start to pass clots or don't improve call us. ° °Diet: ° °You may return to your normal diet immediately. Because of the raw surface of your bladder, alcohol, spicy foods, foods high in acid and drinks with caffeine may cause irritation or frequency and should be used in moderation. To keep your urine flowing freely and avoid constipation, drink plenty of fluids during the day (8-10 glasses). Tip: Avoid cranberry juice because it is very acidic. ° °Activity: ° °Your physical activity doesn't need to be restricted. However, if you are very active, you may see some blood in the urine. We suggest that you reduce your activity under the circumstances until the bleeding has stopped. ° °Bowels: ° °It is important to keep your bowels regular during the postoperative  period. Straining with bowel movements can cause bleeding. A bowel movement every other day is reasonable. Use a mild laxative if needed, such as milk of magnesia 2-3 tablespoons, or 2 Dulcolax tablets. Call if you continue to have problems. If you had been taking narcotics for pain, before, during or after your surgery, you may be constipated. Take a laxative if necessary. ° ° ° °Medication: ° °You should resume your pre-surgery medications unless told not to. In addition you may be given an antibiotic to prevent or treat infection. Antibiotics are not always necessary. All medication should be taken as prescribed until the bottles are finished unless you are having an unusual reaction to one of the drugs. ° ° °[facility] °[place], [place] [address] °[phone] ° ° ° ° °

## 2019-02-24 NOTE — Transfer of Care (Signed)
Immediate Anesthesia Transfer of Care Note  [name]  Procedure(s) Performed: CYSTOSCOPY WITH URETEROSCOPY AND STENT Exchange (Left Ureter) URETERAL BIOPSY (Left Ureter) CYSTOSCOPY WITH Bladder BIOPSY (N/A Bladder) CYSTOSCOPY WITH RETROGRADE PYELOGRAM (Left Ureter) CYSTOSCOPY WITH FULGERATION (N/A Bladder)  Patient Location: PACU  Anesthesia Type:General  Level of Consciousness: awake, alert  and oriented  Airway & Oxygen Therapy: Patient Spontanous Breathing and Patient connected to face mask oxygen  Post-op Assessment: Report given to RN and Post -op Vital signs reviewed and stable  Post vital signs: Reviewed and stable  Last Vitals:  Vitals Value Taken Time  BP 147/73 02/24/19 1103  Temp    Pulse 60 02/24/19 1105  Resp 13 02/24/19 1105  SpO2 99 % 02/24/19 1105  Vitals shown include unvalidated device data.  Last Pain:  Vitals:   02/24/19 0804  TempSrc: Temporal  PainSc: 0-No pain         Complications: No apparent anesthesia complications

## 2019-02-24 NOTE — Op Note (Signed)
Date of procedure: 02/24/19  Preoperative diagnosis:  1. 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 History of CIS of the bladder  Postoperative diagnosis:  1. Same as above  Procedure: 1. Cystoscopy 2. Left retrograde pyelogram 3. Left ureteroscopy 4. Left distal ureteral biopsy 5. Targeted bladder biopsy 6. Ureteral stent exchange  Surgeon: Hollice Espy, MD  Anesthesia: General  Complications: None  Intraoperative findings: Small area of circumferential narrowing but easily able to accommodate 8 French ureteroscope.  Very subtle inflammatory appearance of the left distal ureter at previous malignancy site but no obvious viable residual tumor microscopically.  Subtle areas of erythema including on the posterior bladder wall.  Papillary edema surrounding the left UO, fever reactive based on appearance.  No obvious bladder tumor.  EBL: Minimal  Specimens: Left distal ureteral biopsy, targeted bladder biopsies  Drains: 6 x 24 French double-J ureteral stent on left  Indication: Elizabeth Trujillo is a 83 y.o. patient with high-grade left upper tract urothelial cancer involving the distal ureter and CIS of the bladder around the UO who is elected conservative management status post induction course of BCG with a stent in place.  She returns today for surveillance.  After reviewing the management options for treatment, she elected to proceed with the above surgical procedure(s). We have discussed the potential benefits and risks of the procedure, side effects of the proposed treatment, the likelihood of the patient achieving the goals of the procedure, and any potential problems that might occur during the procedure or recuperation. Informed consent has been obtained.  Description of procedure:  The patient was taken to the operating room and general anesthesia was induced.  The patient was placed in the dorsal lithotomy position, prepped and  draped in the usual sterile fashion, and preoperative antibiotics were administered. A preoperative time-out was performed.   A 21 French cystoscope was advanced per urethra into the bladder.  The distal coil of the previously placed ureteral stent was seen emanating from the UO.  The stent graspers were used to grab the distal aspect and bring it to the level of the urethral meatus.  There was then cannulated using a sensor wire up to the level of the kidney.  The stent was easily removed and the wire was snapped in place as a safety wire.  A 4.5 semirigid ureteroscope was then brought in and advanced into the left distal ureter.  Notably, the overall appearance was significantly improved without any obvious viable residual tumor visible to the eye.  There was a very subtle changes on the surface which appeared mostly inflammatory and/or reactive in appearance.  There is no obvious papillary lesions.  The scope was advanced up to the pelvic brim where there was a small area of narrowing but was easily passable with the ureteroscope.  I was able to advance scope all the way up to level the proximal ureter no obvious tumor was identified.  Under direct visualization, a second Super Stiff wire was then placed up to the level of the kidney.  The semirigid was removed and a flexible digital 8 French ureteroscope was advanced easily up to the level of the renal pelvis.  Each and every calyx was then directly visualized and the kidney itself appeared free of any tumors lesions or suspicious findings.  Retrograde pyelogram was performed by injecting contrast through the scope which created a roadmap to ensure that every calyx have been directly visualized.  There is no  extravasation.  The scope was then backed down the length of the ureter inspecting along the way.  Findings were as previously described.  Finally, I readvanced the semirigid ureteroscope within the distal ureter and multiple biopsies were taken throughout  the entirety of the ureter to sample the distal ureter using Piranha forceps.  At least 5 or so biopsies were taken which were actually fairly decent in size.  No contrast extravasation was seen.  Finally, rigid cystoscope was readvanced back into the bladder.  There is multiple areas of erythema which were nonspecific which included the posterior bladder wall and around the left UO.  There is some bullous versus papillary edema around the UO itself which again appeared to be primarily reactive.  Kocher biopsy forceps were used to biopsy some of this edema around the UO as well as representative samples on the left and right bladder wall on the posterior bladder wall and dome.  These are passed off field as targeted bladder biopsies as I specifically try to target areas which are more erythematous than others.  Careful and adequate hemostasis was achieved using Bugbee electrocautery.  Finally, a 6 x 24 French double-J ureteral stent was advanced over the wire up to the level of the left renal pelvis.  The wires partially drawn till full coils noted both within the renal pelvis as well as within the bladder.  Placement was excellent.    The patient was then cleaned and dried, repositioned in the supine position, reversed from anesthesia, taken to the PACU in stable condition.  Plan: We will plan on calling her with her pathology results.  Also see her in early January to review this more detail.  We will decide on how to manage her stent based on the pathology.    Hollice Espy, M.D.

## 2019-02-24 NOTE — Interval H&P Note (Signed)
History and Physical Interval Note:  02/24/2019 9:43 AM  Elizabeth Trujillo  has presented today for surgery, with the diagnosis of left hydronephrosis, bladder cancer.  The various methods of treatment have been discussed with the patient and family. After consideration of risks, benefits and other options for treatment, the patient has consented to  Procedure(s): CYSTOSCOPY WITH URETEROSCOPY AND STENT Exchange (Left) URETERAL BIOPSY (Left) CYSTOSCOPY WITH Bladder BIOPSY (N/A) as a surgical intervention.  The patient's history has been reviewed, patient examined, no change in status, stable for surgery.  I have reviewed the patient's chart and labs.  Questions were answered to the patient's satisfaction.    RRR CTAB   Hollice Espy

## 2019-02-24 NOTE — Anesthesia Post-op Follow-up Note (Signed)
Anesthesia QCDR form completed.        

## 2019-02-24 NOTE — Anesthesia Postprocedure Evaluation (Signed)
Anesthesia Post Note  [name]  Procedure(s) Performed: CYSTOSCOPY WITH URETEROSCOPY AND STENT Exchange (Left Ureter) URETERAL BIOPSY (Left Ureter) CYSTOSCOPY WITH Bladder BIOPSY (N/A Bladder) CYSTOSCOPY WITH RETROGRADE PYELOGRAM (Left Ureter) CYSTOSCOPY WITH FULGERATION (N/A Bladder)  Patient location during evaluation: PACU Anesthesia Type: General Level of consciousness: awake and alert Pain management: pain level controlled Vital Signs Assessment: post-procedure vital signs reviewed and stable Respiratory status: spontaneous breathing, nonlabored ventilation, respiratory function stable and patient connected to nasal cannula oxygen Cardiovascular status: blood pressure returned to baseline and stable Postop Assessment: no apparent nausea or vomiting Anesthetic complications: no     Last Vitals:  Vitals:   02/24/19 1226 02/24/19 1244  BP: (!) 152/79 (!) 143/79  Pulse: 64 (!) 58  Resp: (!) 22 18  Temp: (!) 36.1 C 36.4 C  SpO2: 97% 99%    Last Pain:  Vitals:   02/24/19 1244  TempSrc: Temporal  PainSc: 0-No pain                 Precious Haws [name]

## 2019-02-24 NOTE — Anesthesia Preprocedure Evaluation (Signed)
Anesthesia Evaluation  Patient identified by MRN, date of birth, ID band Patient awake    Reviewed: Allergy & Precautions, H&P , NPO status , Patient's Chart, lab work & pertinent test results  History of Anesthesia Complications Negative for: history of anesthetic complications  Airway Mallampati: III  TM Distance: <3 FB Neck ROM: limited    Dental  (+) Chipped   Pulmonary neg pulmonary ROS, neg shortness of breath,           Cardiovascular Exercise Tolerance: Good hypertension, (-) angina(-) Past MI and (-) DOE      Neuro/Psych Seizures -,  PSYCHIATRIC DISORDERS TIACVA    GI/Hepatic Neg liver ROS, GERD  Medicated and Controlled,  Endo/Other  negative endocrine ROS  Renal/GU      Musculoskeletal  (+) Arthritis ,   Abdominal   Peds  Hematology negative hematology ROS (+)   Anesthesia Other Findings Past Medical History: No date: Arthritis 1986: Breast cancer ([place])     Comment:  left mastectomy 1986: Cancer ([place])     Comment:  left breast No date: Collagen vascular disease (HCC)     Comment:  RA  No date: Elevated triglycerides with high cholesterol No date: GERD (gastroesophageal reflux disease)     Comment:  history of reflux No date: Hypercholesterolemia No date: Hypertension No date: Stroke [facility])     Comment:  TIA  Past Surgical History: [id number]: ABDOMINAL HYSTERECTOMY     Comment:  TAH/BSO 01/23/2013: BREAST BIOPSY; Right     Comment:  [place] bx/clip-neg 07/1984: BREAST SURGERY; Left     Comment:  Mastectomy No date: callus removed from left toe 2015: CATARACT EXTRACTION W/ INTRAOCULAR LENS IMPLANT; Left 2013: EYE SURGERY; Left 04/2013: FOOT SURGERY; Left 11/03/2015: KNEE ARTHROPLASTY; Right     Comment:  Procedure: COMPUTER ASSISTED TOTAL KNEE ARTHROPLASTY;                Surgeon: Dereck Leep, MD;  Location: [facility] ORS;                Service: Orthopedics;  Laterality: Right; 1986: MASTECTOMY;  Left     Comment:  positive  BMI    Body Mass Index: 37.49 kg/m      Reproductive/Obstetrics negative OB ROS                             Anesthesia Physical  Anesthesia Plan  ASA: III  Anesthesia Plan: General ETT   Post-op Pain Management:    Induction: Intravenous  PONV Risk Score and Plan: Ondansetron, Dexamethasone, Midazolam and Treatment may vary due to age or medical condition  Airway Management Planned: Oral ETT and Video Laryngoscope Planned  Additional Equipment:   Intra-op Plan:   Post-operative Plan: Extubation in OR  Informed Consent: I have reviewed the patients History and Physical, chart, labs and discussed the procedure including the risks, benefits and alternatives for the proposed anesthesia with the patient or authorized representative who has indicated his/her understanding and acceptance.     Dental Advisory Given  Plan Discussed with: Anesthesiologist, CRNA and Surgeon  Anesthesia Plan Comments: (Patient has clearance from Dr. Manuella Ghazi to stop taking her anti coagulation for this procedure.  Patient consented that with her history she may be higher risk for stroke and she voiced understanding.  Patient consented for risks of anesthesia including but not limited to:  - adverse reactions to medications - damage to teeth, lips or other oral mucosa -  sore throat or hoarseness - Damage to heart, brain, lungs or loss of life  Patient voiced understanding.)        Anesthesia Quick Evaluation

## 2019-02-24 NOTE — Anesthesia Procedure Notes (Signed)
Procedure Name: Intubation Performed by: Hunt Zajicek Ben, CRNA Pre-anesthesia Checklist: Patient identified, Emergency Drugs available, Suction available and Patient being monitored Patient Re-evaluated:Patient Re-evaluated prior to induction Oxygen Delivery Method: Circle system utilized Preoxygenation: Pre-oxygenation with 100% oxygen Induction Type: IV induction Ventilation: Mask ventilation without difficulty LMA: LMA inserted LMA Size: 4.0 Tube type: Oral Number of attempts: 1 Airway Equipment and Method: Oral airway Placement Confirmation: positive ETCO2 and breath sounds checked- equal and bilateral Tube secured with: Tape Dental Injury: Teeth and Oropharynx as per pre-operative assessment        

## 2019-02-25 ENCOUNTER — Telehealth: Payer: Self-pay | Admitting: Urology

## 2019-02-25 LAB — SURGICAL PATHOLOGY

## 2019-02-25 MED ORDER — CIPROFLOXACIN HCL 500 MG PO TABS: 500.0000 mg | ORAL_TABLET | Freq: Two times a day (BID) | ORAL | 0 refills | Status: DC

## 2019-02-25 NOTE — Telephone Encounter (Signed)
Pt husband called office stating pt had surgery yesterday and isn't feeling well. Pt has a temp of 100.1 after taking tylenol 45 mins ago. Pt feels weak and fatigued. Husband is asking for antibiotic to be called in today just in case pt needs it tonight.   Please advise.   Thanks.

## 2019-02-25 NOTE — Telephone Encounter (Signed)
Patient's husband informed-sent in to pharmacy requested.  Voiced understanding.

## 2019-02-25 NOTE — Telephone Encounter (Signed)
cipro 500 mg bid x 3 days  Hollice Espy, MD

## 2019-02-25 NOTE — Addendum Note (Signed)
Addended by: Verlene Mayer A on: 02/25/2019 04:55 PM   Modules accepted: Orders

## 2019-02-25 NOTE — Telephone Encounter (Signed)
Patient states she is having minimal pain, fatigued. Denies difficulty voiding, has been staying hydrated. Only complaint is the low grade fever.

## 2019-02-26 NOTE — Telephone Encounter (Signed)
Pt's husband called back and has some concerns about his wife.  She's not feeling any better and is running a fever.  He would like for you to call him back today to see what they should do.

## 2019-02-26 NOTE — Telephone Encounter (Signed)
Patient informed per verbal Dr. Erlene Quan continue on course of antibiotics. Will notify the office if her symptoms worsen. She is aware of her pathology-verbal per Dr. Erlene Quan.

## 2019-03-18 ENCOUNTER — Telehealth: Payer: Self-pay | Admitting: Urology

## 2019-03-18 NOTE — Telephone Encounter (Signed)
Pt requests a call back to discuss her appt tomorrow, she also wants to know if her husband can come with her.

## 2019-03-18 NOTE — Telephone Encounter (Signed)
Answered patient's questions regarding appointment tomorrow. Voiced understanding.

## 2019-03-19 ENCOUNTER — Ambulatory Visit (INDEPENDENT_AMBULATORY_CARE_PROVIDER_SITE_OTHER): Payer: Medicare Other | Admitting: Urology

## 2019-03-19 ENCOUNTER — Encounter: Payer: Self-pay | Admitting: Urology

## 2019-03-19 ENCOUNTER — Other Ambulatory Visit: Payer: Self-pay

## 2019-03-19 VITALS — BP 145/84 | HR 78 | Ht 63.0 in | Wt 210.0 lb

## 2019-03-19 DIAGNOSIS — C662 Malignant neoplasm of left ureter: Secondary | ICD-10-CM

## 2019-03-19 DIAGNOSIS — N390 Urinary tract infection, site not specified: Secondary | ICD-10-CM | POA: Diagnosis not present

## 2019-03-19 DIAGNOSIS — D090 Carcinoma in situ of bladder: Secondary | ICD-10-CM

## 2019-03-19 MED ORDER — CIPROFLOXACIN HCL 500 MG PO TABS
500.0000 mg | ORAL_TABLET | Freq: Once | ORAL | Status: AC
  Administered 2019-03-19: 500 mg via ORAL

## 2019-03-19 NOTE — Progress Notes (Signed)
[name] Female    DOB: 30-Jan-1936   84 y.o.   MRN: [id number] Visit Date: 03/20/2019  Today's Provider: [name], MD   Chief Complaint  Patient presents with  . Follow-up  . Hypertension  . Hyperlipidemia  . Seizures   Subjective:     HPI   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[place]) From 10/29/2018-Per urology.  TIA (transient ischemic attack) From 10/29/2018-Per neurology. OK to stop plavix for procedures.  Seizure disorder ([place]) From 10/29/2018-Neurology.  Essential hypertension From 7/27/25020-Controlled. labs were okay.  Hypercholesterolemia From 7/27/25020-labs were okay.   Allergies  Allergen Reactions  . Penicillin V Potassium Hives    Has patient had a PCN reaction causing immediate rash, facial/tongue/throat swelling, SOB or lightheadedness with hypotension: no Has patient had a PCN reaction causing severe rash involving mucus membranes or skin necrosis: {no Has patient had a PCN reaction that required hospitalization no Has patient had a PCN reaction occurring within the last 10 years: no If all of the above answers are "NO", then may proceed with Cephalosporin use.  . Sulfa Antibiotics Hives     Current Outpatient Medications:  .  acetaminophen (TYLENOL) 500 MG tablet, Take 1,000 mg by mouth every 6 (six) hours as needed for moderate pain (pain.). , Disp: , Rfl:  .  aspirin EC 81 MG tablet, Take 81 mg by mouth at bedtime., Disp: , Rfl:  .  atorvastatin (LIPITOR) 40 MG tablet, Take 1 tablet (40 mg total) by mouth daily. (Patient taking differently: Take 40 mg by mouth at bedtime. ), Disp: 30 tablet, Rfl: 11 .  Calcium Carb-Cholecalciferol (CALCIUM+D3 PO), Take 1 tablet by mouth daily at 2 PM., Disp: , Rfl:  .  cholecalciferol (VITAMIN D) 25 MCG (1000 UT) tablet, Take 1,000 Units by mouth at bedtime., Disp: , Rfl:  .  HYDROcodone-acetaminophen (NORCO/VICODIN) 5-325 MG tablet, Take 1-2 tablets by mouth every 6 (six) hours as needed for  moderate pain., Disp: 10 tablet, Rfl: 0 .  hydrocortisone 2.5 % cream, Apply 1 application topically daily as needed for rash., Disp: , Rfl:  .  ketoconazole (NIZORAL) 2 % shampoo, Apply 1 application topically once a week. , Disp: , Rfl:  .  levETIRAcetam (KEPPRA) 250 MG tablet, Take 250 mg by mouth 2 (two) times daily. , Disp: , Rfl:  .  Multiple Vitamin (MULTIVITAMIN WITH MINERALS) TABS tablet, Take 1 tablet by mouth daily at 2 PM., Disp: , Rfl:  .  nystatin cream (MYCOSTATIN), Apply 1 application topically 2 (two) times daily. (Patient taking differently: Apply 1 application topically 2 (two) times daily as needed (yeast). ), Disp: 30 g, Rfl: 1 .  traMADol (ULTRAM) 50 MG tablet, Take 1 tablet (50 mg total) by mouth 2 (two) times daily. (Patient taking differently: Take 50 mg by mouth 2 (two) times daily as needed (pain.). ), Disp: 30 tablet, Rfl: 0  Review of Systems  Constitutional: Negative for appetite change, chills, fatigue and fever.  HENT: Negative.   Eyes: Negative.   Respiratory: Negative for chest tightness and shortness of breath.   Cardiovascular: Negative for chest pain and palpitations.  Gastrointestinal: Negative for abdominal pain, nausea and vomiting.  Endocrine: Negative.   Allergic/Immunologic: Negative.   Neurological: Negative for dizziness and weakness.  Psychiatric/Behavioral: Negative.     Social History   Tobacco Use  . Smoking status: Never Smoker  . Smokeless tobacco: Never Used  Substance Use Topics  . Alcohol use: No  Objective:   BP 135/84 (BP Location: Right Arm, Patient Position: Sitting, Cuff Size: Large)   Pulse 69   Temp (!) 97.1 F (36.2 C) (Other (Comment))   Resp 18   Ht _0  (1.6 m)   Wt 215 lb (97.5 kg)   SpO2 98%   BMI 38.09 kg/m  Vitals:   03/20/19 1329  BP: 135/84  Pulse: 69  Resp: 18  Temp: (!) 97.1 F (36.2 C)  TempSrc: Other (Comment)  SpO2: 98%  Weight: 215 lb (97.5 kg)  Height: _1  (1.6 m)  Body mass index  is 38.09 kg/m.   Physical Exam Vitals reviewed.  Constitutional:      Appearance: She is obese.  HENT:     Right Ear: External ear normal.     Left Ear: External ear normal.  Eyes:     General: No scleral icterus.    Conjunctiva/sclera: Conjunctivae normal.  Cardiovascular:     Rate and Rhythm: Normal rate and regular rhythm.     Heart sounds: Normal heart sounds.  Pulmonary:     Breath sounds: Normal breath sounds.  Chest:     Breasts:        Right: Normal.        Left: Absent.  Abdominal:     Palpations: Abdomen is soft.  Musculoskeletal:     Right lower leg: Edema present.     Left lower leg: Edema present.     Comments: 1+ ankle/pedal edema.  Skin:    General: Skin is warm and dry.  Neurological:     Mental Status: She is alert and oriented to person, place, and time.  Psychiatric:        Mood and Affect: Mood normal.        Behavior: Behavior normal.        Thought Content: Thought content normal.        Judgment: Judgment normal.      No results found for any visits on 03/20/19.     Assessment & Plan    1. Essential hypertension More than 50% 25 minute visit spent in counseling or coordination of care  - CBC with Diff - Comp Met (CMET)  2. Hypercholesterolemia  - Lipid panel  3. Malignant neoplasm of left breast in female, estrogen receptor positive, unspecified site of breast ([place])   4. Prediabetes  - Hemoglobin A1c 5.Breast cancer 6.Urothelial Cancer In remission.    Richard Cranford Mon, MD  [facility]

## 2019-03-19 NOTE — Progress Notes (Signed)
   03/19/19  CC:  Chief Complaint  Patient presents with  . Cysto Stent Removal    HPI: 84 year old female with a personal history of CIS of the bladder and left upper tract urothelial carcinoma, high-grade of the distal ureter status post ablative therapy followed by induction BCG.  Most recent return to the operating room showed no evidence of residual disease both in the ureter or bladder.  She returns today for cystoscopy, stent removal.  Blood pressure (!) 145/84, pulse 78, height 5\' 3"  (1.6 m), weight 210 lb (95.3 kg). NED. A&Ox3.   No respiratory distress   Abd soft, NT, ND Normal external genitalia with patent urethral meatus  Cystoscopy/ Stent removal procedure  Patient identification was confirmed, informed consent was obtained, and patient was prepped using Betadine solution.  Lidocaine jelly was administered per urethral meatus.    Preoperative abx where received prior to procedure.    Procedure: - Flexible cystoscope introduced, without any difficulty.   - Thorough search of the bladder revealed:    normal urethral meatus  Stent seen emanating from left ureteral orifice, grasped with stent graspers, and removed in entirety.     Post-Procedure: - Patient tolerated the procedure well   Assessment/ Plan:  1. Primary ureteral papillary carcinoma, left ([place]) Status post recent negative ureteroscopy with negative biopsies, no evidence of residual malignancy  Plan to follow her extremely closely, she understands the risk of recurrence is quite high  We will plan for CT urogram in 3 months with cystoscopy.  She understands that we will eventually need to return to the operating room for endoscopic surveillance as well.  She is agreeable this plan.  In the interim, her stent was removed today.  Warning symptoms reviewed.  All questions answered.  I reminded her about follow-through with genetics which she is interested in pursuing.  We will have her reschedule  this. - Urinalysis, Complete  2. CIS (carcinoma in situ of bladder) As above    Return in about 3 months (around 06/17/2019) for f/u CT urogram, cysto/ cytology (also help her reschedule genetics).  Hollice Espy, MD

## 2019-03-19 NOTE — Addendum Note (Signed)
Addended by: Verlene Mayer A on: 03/19/2019 02:57 PM   Modules accepted: Orders

## 2019-03-19 NOTE — Addendum Note (Signed)
Addended by: Verlene Mayer A on: 03/19/2019 02:16 PM   Modules accepted: Orders

## 2019-03-20 ENCOUNTER — Encounter: Payer: Self-pay | Admitting: Family Medicine

## 2019-03-20 ENCOUNTER — Ambulatory Visit (INDEPENDENT_AMBULATORY_CARE_PROVIDER_SITE_OTHER): Payer: Medicare Other | Admitting: Family Medicine

## 2019-03-20 VITALS — BP 135/84 | HR 69 | Temp 97.1°F | Resp 18 | Ht 63.0 in | Wt 215.0 lb

## 2019-03-20 DIAGNOSIS — R7303 Prediabetes: Secondary | ICD-10-CM | POA: Diagnosis not present

## 2019-03-20 DIAGNOSIS — I10 Essential (primary) hypertension: Secondary | ICD-10-CM | POA: Diagnosis not present

## 2019-03-20 DIAGNOSIS — Z170 Estrogen receptor positive status [ER+]: Secondary | ICD-10-CM | POA: Diagnosis not present

## 2019-03-20 DIAGNOSIS — C50912 Malignant neoplasm of unspecified site of left female breast: Secondary | ICD-10-CM

## 2019-03-20 DIAGNOSIS — E7800 Pure hypercholesterolemia, unspecified: Secondary | ICD-10-CM

## 2019-03-20 DIAGNOSIS — R7309 Other abnormal glucose: Secondary | ICD-10-CM | POA: Diagnosis not present

## 2019-03-21 ENCOUNTER — Telehealth: Payer: Self-pay | Admitting: Urology

## 2019-03-21 LAB — CBC WITH DIFFERENTIAL/PLATELET
Basophils Absolute: 0.1 10*3/uL (ref 0.0–0.2)
Basos: 1 %
EOS (ABSOLUTE): 0.2 10*3/uL (ref 0.0–0.4)
Eos: 3 %
Hematocrit: 40.8 % (ref 34.0–46.6)
Hemoglobin: 13.5 g/dL (ref 11.1–15.9)
Immature Grans (Abs): 0 10*3/uL (ref 0.0–0.1)
Immature Granulocytes: 0 %
Lymphocytes Absolute: 2.1 10*3/uL (ref 0.7–3.1)
Lymphs: 27 %
MCH: 29.7 pg (ref 26.6–33.0)
MCHC: 33.1 g/dL (ref 31.5–35.7)
MCV: 90 fL (ref 79–97)
Monocytes Absolute: 0.9 10*3/uL (ref 0.1–0.9)
Monocytes: 11 %
Neutrophils Absolute: 4.6 10*3/uL (ref 1.4–7.0)
Neutrophils: 58 %
Platelets: 371 10*3/uL (ref 150–450)
RBC: 4.55 x10E6/uL (ref 3.77–5.28)
RDW: 14.1 % (ref 11.7–15.4)
WBC: 7.9 10*3/uL (ref 3.4–10.8)

## 2019-03-21 LAB — URINALYSIS, COMPLETE
Bilirubin, UA: NEGATIVE
Glucose, UA: NEGATIVE
Ketones, UA: NEGATIVE
Nitrite, UA: NEGATIVE
Protein,UA: NEGATIVE
Specific Gravity, UA: 1.025 (ref 1.005–1.030)
Urobilinogen, Ur: 0.2 mg/dL (ref 0.2–1.0)
pH, UA: 5 (ref 5.0–7.5)

## 2019-03-21 LAB — COMPREHENSIVE METABOLIC PANEL
ALT: 14 IU/L (ref 0–32)
AST: 15 IU/L (ref 0–40)
Albumin/Globulin Ratio: 1.4 (ref 1.2–2.2)
Albumin: 3.8 g/dL (ref 3.6–4.6)
Alkaline Phosphatase: 119 IU/L — ABNORMAL HIGH (ref 39–117)
BUN/Creatinine Ratio: 20 (ref 12–28)
BUN: 12 mg/dL (ref 8–27)
Bilirubin Total: 0.3 mg/dL (ref 0.0–1.2)
CO2: 23 mmol/L (ref 20–29)
Calcium: 9.2 mg/dL (ref 8.7–10.3)
Chloride: 107 mmol/L — ABNORMAL HIGH (ref 96–106)
Creatinine, Ser: 0.61 mg/dL (ref 0.57–1.00)
GFR calc Af Amer: 98 mL/min/{1.73_m2} (ref >59)
GFR calc non Af Amer: 85 mL/min/{1.73_m2} (ref >59)
Globulin, Total: 2.7 g/dL (ref 1.5–4.5)
Glucose: 106 mg/dL — ABNORMAL HIGH (ref 65–99)
Potassium: 4.2 mmol/L (ref 3.5–5.2)
Sodium: 142 mmol/L (ref 134–144)
Total Protein: 6.5 g/dL (ref 6.0–8.5)

## 2019-03-21 LAB — LIPID PANEL
Chol/HDL Ratio: 4.9 ratio — ABNORMAL HIGH (ref 0.0–4.4)
Cholesterol, Total: 221 mg/dL — ABNORMAL HIGH (ref 100–199)
HDL: 45 mg/dL (ref >39)
LDL Chol Calc (NIH): 125 mg/dL — ABNORMAL HIGH (ref 0–99)
Triglycerides: 286 mg/dL — ABNORMAL HIGH (ref 0–149)
VLDL Cholesterol Cal: 51 mg/dL — ABNORMAL HIGH (ref 5–40)

## 2019-03-21 LAB — MICROSCOPIC EXAMINATION: Bacteria, UA: NONE SEEN

## 2019-03-21 LAB — HGB A1C W/O EAG: Hgb A1c MFr Bld: 6.5 % — ABNORMAL HIGH (ref 4.8–5.6)

## 2019-03-21 NOTE — Telephone Encounter (Signed)
Patient informed-Oncology will contact patient to setup appointment.

## 2019-03-21 NOTE — Telephone Encounter (Signed)
This patient called and said you were going to call her to get her set up with a genetics doctor or tell her who she was supposed to call? I didn't see any notes about that.  Sharyn Lull

## 2019-03-25 DIAGNOSIS — M2042 Other hammer toe(s) (acquired), left foot: Secondary | ICD-10-CM | POA: Diagnosis not present

## 2019-03-28 ENCOUNTER — Ambulatory Visit: Payer: Self-pay

## 2019-03-28 NOTE — Telephone Encounter (Signed)
Message from Sheran Luz sent at 03/28/2019 1:14 PM EST  Summary: covid vaccine question- requesting to speak with RN    Patient requesting to speak with RN regarding covid vaccine. Patient would not disclose much additional information, other than she has heard of a "newer vaccination" and would like to discuss with Nurse.           Returned call to pt.  Reported she heard on the News, today, "about a newer vaccine that will be available toward the end of the month, by Wynetta Emery and Wynetta Emery, that will be a one time dose, instead of the vaccine that is offered in 2 different doses."  She is asking if Dr. Rosanna Randy would advise what his recommendation is re: taking the vaccine in 2 parts, vs. waiting to receive the one that is coming available in one dose.  She is scheduled for her 1st dose of the 2- part vaccine tomorrow.  She would like to receive a call back today, if at all possible.

## 2019-03-28 NOTE — Telephone Encounter (Signed)
Patient advised as below.  

## 2019-03-28 NOTE — Telephone Encounter (Signed)
While this is predicted, we do not have any information for sure.  I'd recommend going ahead and getting what is available now  [employer] and Moderna vaccines are safe.

## 2019-03-29 DIAGNOSIS — Z23 Encounter for immunization: Secondary | ICD-10-CM | POA: Diagnosis not present

## 2019-03-31 ENCOUNTER — Telehealth: Payer: Self-pay

## 2019-03-31 NOTE — Telephone Encounter (Signed)
-----   Message from Hollice Espy, MD sent at 03/31/2019  3:42 PM EST -----  Return in about 3 months (around 06/17/2019) for f/u CT urogram, cysto/ cytology- this appointment is not scheduled for unclear reasons.  Please arrange.

## 2019-03-31 NOTE — Telephone Encounter (Signed)
Patient notified and scheduled, order previously placed Patient states she has still not heard from genetic dept. Called oncology and left a vmail to return call

## 2019-04-22 DIAGNOSIS — Z23 Encounter for immunization: Secondary | ICD-10-CM | POA: Diagnosis not present

## 2019-05-14 DIAGNOSIS — H2511 Age-related nuclear cataract, right eye: Secondary | ICD-10-CM | POA: Diagnosis not present

## 2019-05-26 ENCOUNTER — Inpatient Hospital Stay: Payer: Medicare Other | Attending: Genetic Counselor | Admitting: Licensed Clinical Social Worker

## 2019-05-26 ENCOUNTER — Encounter: Payer: Self-pay | Admitting: Licensed Clinical Social Worker

## 2019-05-26 DIAGNOSIS — Z801 Family history of malignant neoplasm of trachea, bronchus and lung: Secondary | ICD-10-CM | POA: Diagnosis not present

## 2019-05-26 DIAGNOSIS — Z853 Personal history of malignant neoplasm of breast: Secondary | ICD-10-CM | POA: Diagnosis not present

## 2019-05-26 DIAGNOSIS — Z8052 Family history of malignant neoplasm of bladder: Secondary | ICD-10-CM

## 2019-05-26 DIAGNOSIS — Z8554 Personal history of malignant neoplasm of ureter: Secondary | ICD-10-CM | POA: Diagnosis not present

## 2019-05-26 DIAGNOSIS — Z803 Family history of malignant neoplasm of breast: Secondary | ICD-10-CM

## 2019-05-26 DIAGNOSIS — C50912 Malignant neoplasm of unspecified site of left female breast: Secondary | ICD-10-CM

## 2019-05-26 NOTE — Progress Notes (Signed)
REFERRING PROVIDER: Hollice Espy, [address] [place],  [place] [id number]  PRIMARY PROVIDER:  [name]., MD  PRIMARY REASON FOR VISIT:  1. Malignant neoplasm of left breast in female, estrogen receptor positive, unspecified site of breast ([place])   2. Family history of breast cancer   3. Family history of bladder cancer   4. Family history of lung cancer   5. History of cancer of ureter    I connected with Elizabeth Trujillo on 05/26/2019 at 10:52 AM EDT by MyChart video and verified that I am speaking with the correct person using two identifiers.    Patient location: home Provider location: [place]   HISTORY OF PRESENT ILLNESS:   Ms. Elizabeth Trujillo, a 84 y.o. female, was seen for a  cancer genetics consultation at the request of Dr. Erlene Quan due to a personal and family history of cancer.  Elizabeth Trujillo presents to clinic today to discuss the possibility of a hereditary predisposition to cancer, genetic testing, and to further clarify her future cancer risks, as well as potential cancer risks for family members.   At the age of 46, Elizabeth Trujillo was diagnosed with breast cancer. This was treated with mastectomy and tamoxifen for 10 years.   At the age of 52, Elizabeth Trujillo was diagnosed with urothelial carcinoma of the left ureter.   CANCER HISTORY:  Oncology History   No history exists.    RISK FACTORS:  Menarche was at age 87.  First live birth at age 16.  OCP use for approximately 0 years.  Ovaries intact: no.  Hysterectomy: yes- after mastectomy at age 38. Menopausal status: postmenopausal.  HRT use: 0 years. Colonoscopy: yes; she reports a few polyps. Mammogram within the last year: yes. Number of breast biopsies: 2. Any excessive radiation exposure in the past: no  Past Medical History:  Diagnosis Date  . Arthritis   . Breast cancer ([place]) 1986   left mastectomy  . Cancer ([place]) 1986   left breast  . Collagen vascular disease ([place])     RA   . Elevated triglycerides with high cholesterol   . Family history of bladder cancer   . Family history of breast cancer   . Family history of lung cancer   . GERD (gastroesophageal reflux disease)    history of reflux  . History of cancer of ureter   . Hypercholesterolemia   . Hypertension   . Seizure ([place])    last 08-2018  . Stroke [facility])    TIA    Past Surgical History:  Procedure Laterality Date  . ABDOMINAL HYSTERECTOMY  729021   TAH/BSO  . BIOPSY Left 11/11/2018   Procedure: BIOPSY;  Surgeon: Hollice Espy, MD;  Location: [facility] ORS;  Service: Urology;  Laterality: Left;  . BREAST BIOPSY Right 01/23/2013   [place] bx/clip-neg  . BREAST SURGERY Left 07/1984   Mastectomy  . callus removed from left toe    . CATARACT EXTRACTION W/ INTRAOCULAR LENS IMPLANT Left 2015  . CYSTOSCOPY W/ RETROGRADES Left 02/24/2019   Procedure: CYSTOSCOPY WITH RETROGRADE PYELOGRAM;  Surgeon: Hollice Espy, MD;  Location: [facility] ORS;  Service: Urology;  Laterality: Left;  . CYSTOSCOPY WITH BIOPSY N/A 02/24/2019   Procedure: CYSTOSCOPY WITH Bladder BIOPSY;  Surgeon: Hollice Espy, MD;  Location: [facility] ORS;  Service: Urology;  Laterality: N/A;  . CYSTOSCOPY WITH FULGERATION N/A 02/24/2019   Procedure: CYSTOSCOPY WITH FULGERATION;  Surgeon: Hollice Espy, MD;  Location: [facility] ORS;  Service: Urology;  Laterality: N/A;  . CYSTOSCOPY WITH URETEROSCOPY AND STENT PLACEMENT Left 10/14/2018   Procedure: CYSTOSCOPY WITH URETEROSCOPY AND STENT PLACEMENT;  Surgeon: Hollice Espy, MD;  Location: [facility] ORS;  Service: Urology;  Laterality: Left;  . CYSTOSCOPY WITH URETEROSCOPY AND STENT PLACEMENT Left 02/24/2019   Procedure: CYSTOSCOPY WITH URETEROSCOPY AND STENT Exchange;  Surgeon: Hollice Espy, MD;  Location: [facility] OR[facility];  Service: Urology;  Laterality: Left;  . CYSTOSCOPY/URETEROSCOPY/HOLMIUM LASER/STENT PLACEMENT Left 11/11/2018   Procedure: CYSTOSCOPY/URETEROSCOPY//STENT EXCHANGE;  Surgeon: Hollice Espy,  MD;  Location: [facility] ORS;  Service: Urology;  Laterality: Left;  . EYE SURGERY Left 2013  . FOOT SURGERY Left 04/2013  . HOLMIUM LASER APPLICATION Left 04/21/5186   Procedure: HOLMIUM LASER APPLICATION;  Surgeon: Hollice Espy, MD;  Location: [facility] ORS;  Service: Urology;  Laterality: Left;  . KNEE ARTHROPLASTY Right 11/03/2015   Procedure: COMPUTER ASSISTED TOTAL KNEE ARTHROPLASTY;  Surgeon: Dereck Leep, MD;  Location: [facility] ORS;  Service: Orthopedics;  Laterality: Right;  . MASTECTOMY Left 1986   positive  . URETERAL BIOPSY Left 10/14/2018   Procedure: URETERAL BIOPSY;  Surgeon: Hollice Espy, MD;  Location: [facility] OR[facility];  Service: Urology;  Laterality: Left;  . URETERAL BIOPSY Left 02/24/2019   Procedure: URETERAL BIOPSY;  Surgeon: Hollice Espy, MD;  Location: [facility] OR[facility];  Service: Urology;  Laterality: Left;    Social History   Socioeconomic History  . Marital status: Married    Spouse name: Not on file  . Number of children: 3  . Years of education: Not on file  . Highest education level: Bachelor's degree (e.g., BA, AB, BS)  Occupational History  . Occupation: retired  Tobacco Use  . Smoking status: Never Smoker  . Smokeless tobacco: Never Used  Substance and Sexual Activity  . Alcohol use: No  . Drug use: No  . Sexual activity: Not Currently  Other Topics Concern  . Not on file  Social History Narrative   Lives at home with husband   Social Determinants of Health   Financial Resource Strain:   . Difficulty of Paying Living Expenses:   Food Insecurity:   . Worried About Charity fundraiser in the Last Year:   . Arboriculturist in the Last Year:   Transportation Needs:   . Film/video editor (Medical):   Marland Kitchen Lack of Transportation (Non-Medical):   Physical Activity:   . Days of Exercise per Week:   . Minutes of Exercise per Session:   Stress:   . Feeling of Stress :   Social Connections:   . Frequency of Communication with Friends and Family:   . Frequency of  Social Gatherings with Friends and Family:   . Attends Religious Services:   . Active Member of Clubs or Organizations:   . Attends Archivist Meetings:   Marland Kitchen Marital Status:      FAMILY HISTORY:  We obtained a detailed, 4-generation family history.  Significant diagnoses are listed below: Family History  Problem Relation Age of Onset  . Glaucoma Mother   . Kidney disease Mother   . Arthritis Mother   . Stroke Mother   . Breast cancer Mother 31  . Bladder Cancer Mother 20  . Heart disease Father   . Cancer Brother 89       lung cancer, smoker  . Breast cancer Cousin        3 mat cousins dx late 32s  . Breast cancer Cousin        1  mat cousin dx 43s-60s   Elizabeth Trujillo has 2 daughters, Elizabeth Trujillo, 80 and [place], 92 and 1 son, Elizabeth Trujillo, 60. None have had cancer. Elizabeth Trujillo did have genetic testing several years ago and was negative for BRCA1/BRCA2, but patient does not have copy of test report. Elizabeth Trujillo was also recently diagnosed with ADH. Patient had 1 paternal half brother, Elizabeth Trujillo, who was diagnosed with lung cancer at 47 and died at 53. He had history of smoking.  Elizabeth Trujillo mother was diagnosed with bladder cancer around age 30 and breast cancer around age 51, she passed at 26. Patient had 2 maternal unlces and 4 maternal aunts, none had cancer. She has 3 maternal cousins who had breast cancer in their late 56s, 2 are sisters. No known cancers in maternal grandparents.  Elizabeth Trujillo father died at 65 of a heart attack. Patient had 2 paternal uncles and 4 paternal aunts, none had cancer. One paternal cousin had breast cancer in her 74s-60s. Paternal grandparents did not have cancer.  Elizabeth Trujillo is aware of previous family history of genetic testing for hereditary cancer risks. Patient's maternal ancestors are of [place] descent, and paternal ancestors are of unknown descent. There is no reported Ashkenazi Jewish ancestry. There is no known consanguinity.  GENETIC COUNSELING ASSESSMENT:  Elizabeth Trujillo is a 84 y.o. female with a personal and family history of breast cancer which is somewhat suggestive of a hereditary cancer syndrome and predisposition to cancer. We, therefore, discussed and recommended the following at today's visit.   DISCUSSION: We discussed that 5 - 10% of breast is hereditary, with most cases associated with BRCA1/BRCA2 mutations.  There are other genes that can be associated with hereditary breast cancer syndromes.  We also discussed Lynch syndrome given her personal history of urothelial carcionoma and her mother's bladder cancer. We discussed that testing is beneficial for several reasons including knowing how to follow individuals for cancer screening and understand if other family members could be at risk for cancer and allow them to undergo genetic testing.   We reviewed the characteristics, features and inheritance patterns of hereditary cancer syndromes. We also discussed genetic testing, including the appropriate family members to test, the process of testing, insurance coverage and turn-around-time for results. We discussed the implications of a negative, positive and/or variant of uncertain significant result. We recommended Elizabeth Trujillo pursue genetic testing for the [facility] Common Hereditary Cancers gene panel.   The Common Hereditary Cancers Panel offered by [facility] includes sequencing and/or deletion duplication testing of the following 48 genes: APC, ATM, AXIN2, BARD1, BMPR1A, BRCA1, BRCA2, BRIP1, CDH1, CDKN2A (p14ARF), CDKN2A (p16INK4a), CKD4, CHEK2, CTNNA1, DICER1, EPCAM (Deletion/duplication testing only), GREM1 (promoter region deletion/duplication testing only), KIT, MEN1, MLH1, MSH2, MSH3, MSH6, MUTYH, NBN, NF1, NHTL1, PALB2, PDGFRA, PMS2, POLD1, POLE, PTEN, RAD50, RAD51C, RAD51D, RNF43, SDHB, SDHC, SDHD, SMAD4, SMARCA4. STK11, TP53, TSC1, TSC2, and VHL.  The following genes were evaluated for sequence changes only: SDHA and HOXB13 c.251G>A variant  only.  Based on Ms. Pember's personal and family history of breast cancer, she meets medical criteria for genetic testing. Despite that she meets criteria, she may still have an out of pocket cost.   PLAN: After considering the risks, benefits, and limitations, Elizabeth Trujillo provided informed consent to pursue genetic testing. A saliva kit will be mailed to her and the sample will be sent to [facility] for analysis of the Common Hereditary Cancers Panel. Results should be available within approximately 2-3 weeks' time, at which point they will be  disclosed by telephone to Elizabeth Trujillo, as will any additional recommendations warranted by these results. Elizabeth Trujillo will receive a summary of her genetic counseling visit and a copy of her results once available. This information will also be available in Epic.   Elizabeth Trujillo questions were answered to her satisfaction today. Our contact information was provided should additional questions or concerns arise. Thank you for the referral and allowing [place] to share in the care of your patient.   Faith Rogue, MS, [facility] Genetic Counselor [email or web address] Phone: [phone]  The patient was seen for a total of 60 minutes in face-to-face genetic counseling.  Her daughter Elizabeth Trujillo was also present.  Drs. Magrinat, Lindi Adie and/or Burr Medico were available for discussion regarding this case.   _______________________________________________________________________ For Office Staff:  Number of people involved in session: 2 Was an Intern/ student involved with case: no

## 2019-06-04 DIAGNOSIS — Z801 Family history of malignant neoplasm of trachea, bronchus and lung: Secondary | ICD-10-CM | POA: Diagnosis not present

## 2019-06-04 DIAGNOSIS — Z8554 Personal history of malignant neoplasm of ureter: Secondary | ICD-10-CM | POA: Diagnosis not present

## 2019-06-04 DIAGNOSIS — Z803 Family history of malignant neoplasm of breast: Secondary | ICD-10-CM | POA: Diagnosis not present

## 2019-06-04 DIAGNOSIS — Z8052 Family history of malignant neoplasm of bladder: Secondary | ICD-10-CM | POA: Diagnosis not present

## 2019-06-04 DIAGNOSIS — C50912 Malignant neoplasm of unspecified site of left female breast: Secondary | ICD-10-CM | POA: Diagnosis not present

## 2019-06-10 NOTE — Progress Notes (Signed)
Subjective:   Elizabeth Trujillo is a 84 y.o. female who presents for Medicare Annual (Subsequent) preventive examination.    This visit is being conducted through telemedicine due to the COVID-19 pandemic. This patient has given me verbal consent via doximity to conduct this visit, patient states they are participating from their home address. Some vital signs may be absent or patient reported.    Patient identification: identified by name, DOB, and current address  Review of Systems:  N/A  Cardiac Risk Factors include: advanced age (>92men, >32 women);dyslipidemia     Objective:     Vitals: There were no vitals taken for this visit.  There is no height or weight on file to calculate BMI. Unable to obtain vitals due to visit being conducted via telephonically.   Advanced Directives 06/11/2019 02/21/2019 11/11/2018 10/10/2018 09/08/2018 09/08/2018 05/16/2018  Does Patient Have a Medical Advance Directive? Yes No Yes Yes Yes No;Yes Yes  Type of Paramedic of [place];Living will - [place];Living will [place];Living will [place];Living will Living will Living will;Healthcare Power of Attorney  Does patient want to make changes to medical advance directive? - - No - Patient declined No - Patient declined No - Patient declined - -  Copy of [place] in Chart? No - copy requested - No - copy requested No - copy requested No - copy requested - Yes - validated most recent copy scanned in chart (See row information)  Would patient like information on creating a medical advance directive? - - - - No - Patient declined No - Patient declined -    Tobacco Social History   Tobacco Use  Smoking Status Never Smoker  Smokeless Tobacco Never Used     Counseling given: Not Answered   Clinical Intake:  Pre-visit preparation completed: Yes  Pain : 0-10 Pain Score: 3  Pain Type: Chronic  pain Pain Orientation: Left Pain Descriptors / Indicators: Aching Pain Frequency: Intermittent     Nutritional Risks: None Diabetes: No  How often do you need to have someone help you when you read instructions, pamphlets, or other written materials from your doctor or pharmacy?: 1 - Never  Interpreter Needed?: No  Information entered by :: [facility], LPN  Past Medical History:  Diagnosis Date  . Arthritis   . Breast cancer ([place]) 1986   left mastectomy  . Cancer ([place]) 1986   left breast  . Collagen vascular disease ([place])    RA   . Elevated triglycerides with high cholesterol   . Family history of bladder cancer   . Family history of breast cancer   . Family history of lung cancer   . GERD (gastroesophageal reflux disease)    history of reflux  . History of cancer of ureter   . Hypercholesterolemia   . Hypertension   . Seizure ([place])    last 08-2018  . Stroke [facility])    TIA   Past Surgical History:  Procedure Laterality Date  . ABDOMINAL HYSTERECTOMY  [id number]   TAH/BSO  . BIOPSY Left 11/11/2018   Procedure: BIOPSY;  Surgeon: Hollice Espy, MD;  Location: [facility] ORS;  Service: Urology;  Laterality: Left;  . BREAST BIOPSY Right 01/23/2013   [place] bx/clip-neg  . BREAST SURGERY Left 07/1984   Mastectomy  . callus removed from left toe    . CATARACT EXTRACTION W/ INTRAOCULAR LENS IMPLANT Left 2015  . CYSTOSCOPY W/ RETROGRADES Left 02/24/2019  Procedure: CYSTOSCOPY WITH RETROGRADE PYELOGRAM;  Surgeon: Hollice Espy, MD;  Location: [facility] OR[facility];  Service: Urology;  Laterality: Left;  . CYSTOSCOPY WITH BIOPSY N/A 02/24/2019   Procedure: CYSTOSCOPY WITH Bladder BIOPSY;  Surgeon: Hollice Espy, MD;  Location: [facility];  Service: Urology;  Laterality: N/A;  . CYSTOSCOPY WITH FULGERATION N/A 02/24/2019   Procedure: CYSTOSCOPY WITH FULGERATION;  Surgeon: Hollice Espy, MD;  Location: [facility];  Service: Urology;  Laterality: N/A;  . CYSTOSCOPY WITH URETEROSCOPY AND STENT  PLACEMENT Left 10/14/2018   Procedure: CYSTOSCOPY WITH URETEROSCOPY AND STENT PLACEMENT;  Surgeon: Hollice Espy, MD;  Location: [facility];  Service: Urology;  Laterality: Left;  . CYSTOSCOPY WITH URETEROSCOPY AND STENT PLACEMENT Left 02/24/2019   Procedure: CYSTOSCOPY WITH URETEROSCOPY AND STENT Exchange;  Surgeon: Hollice Espy, MD;  Location: [facility];  Service: Urology;  Laterality: Left;  . CYSTOSCOPY/URETEROSCOPY/HOLMIUM LASER/STENT PLACEMENT Left 11/11/2018   Procedure: CYSTOSCOPY/URETEROSCOPY//STENT EXCHANGE;  Surgeon: Hollice Espy, MD;  Location: [facility];  Service: Urology;  Laterality: Left;  . EYE SURGERY Left 2013  . FOOT SURGERY Left 04/2013  . HOLMIUM LASER APPLICATION Left [id number]   Procedure: HOLMIUM LASER APPLICATION;  Surgeon: Hollice Espy, MD;  Location: [facility];  Service: Urology;  Laterality: Left;  . KNEE ARTHROPLASTY Right 11/03/2015   Procedure: COMPUTER ASSISTED TOTAL KNEE ARTHROPLASTY;  Surgeon: Dereck Leep, MD;  Location: [facility];  Service: Orthopedics;  Laterality: Right;  . MASTECTOMY Left 1986   positive  . URETERAL BIOPSY Left 10/14/2018   Procedure: URETERAL BIOPSY;  Surgeon: Hollice Espy, MD;  Location: [facility];  Service: Urology;  Laterality: Left;  . URETERAL BIOPSY Left 02/24/2019   Procedure: URETERAL BIOPSY;  Surgeon: Hollice Espy, MD;  Location: [facility];  Service: Urology;  Laterality: Left;   Family History  Problem Relation Age of Onset  . Glaucoma Mother   . Kidney disease Mother   . Arthritis Mother   . Stroke Mother   . Breast cancer Mother 10  . Bladder Cancer Mother 2  . Heart disease Father   . Cancer Brother 32       lung cancer, smoker  . Breast cancer Cousin        3 mat cousins dx late 42s  . Breast cancer Cousin        1 mat cousin dx 64s-60s   Social History   Socioeconomic History  . Marital status: Married    Spouse name: Not on file  . Number of children: 3  . Years of education: Not on file  . Highest  education level: Bachelor's degree (e.g., BA, AB, BS)  Occupational History  . Occupation: retired  Tobacco Use  . Smoking status: Never Smoker  . Smokeless tobacco: Never Used  Substance and Sexual Activity  . Alcohol use: No  . Drug use: No  . Sexual activity: Not Currently  Other Topics Concern  . Not on file  Social History Narrative   Lives at home with husband   Social Determinants of Health   Financial Resource Strain: Low Risk   . Difficulty of Paying Living Expenses: Not hard at all  Food Insecurity: No Food Insecurity  . Worried About Charity fundraiser in the Last Year: Never true  . Ran Out of Food in the Last Year: Never true  Transportation Needs: No Transportation Needs  . Lack of Transportation (Medical): No  . Lack of Transportation (Non-Medical): No  Physical Activity: Inactive  . Days of Exercise per Week: 0  days  . Minutes of Exercise per Session: 0 min  Stress: No Stress Concern Present  . Feeling of Stress : Not at all  Social Connections: Slightly Isolated  . Frequency of Communication with Friends and Family: Twice a week  . Frequency of Social Gatherings with Friends and Family: More than three times a week  . Attends Religious Services: More than 4 times per year  . Active Member of Clubs or Organizations: No  . Attends Archivist Meetings: Never  . Marital Status: Married    Outpatient Encounter Medications as of 06/11/2019  Medication Sig  . acetaminophen (TYLENOL) 500 MG tablet Take 1,000 mg by mouth every 6 (six) hours as needed for moderate pain (pain.).   Marland Kitchen aspirin EC 81 MG tablet Take 81 mg by mouth at bedtime.  Marland Kitchen atorvastatin (LIPITOR) 40 MG tablet Take 1 tablet (40 mg total) by mouth daily. (Patient taking differently: Take 40 mg by mouth at bedtime. )  . Calcium Carb-Cholecalciferol (CALCIUM+D3 PO) Take 1 tablet by mouth daily at 2 PM.  . cholecalciferol (VITAMIN D) 25 MCG (1000 UT) tablet Take 1,000 Units by mouth at bedtime.   Marland Kitchen HYDROcodone-acetaminophen (NORCO/VICODIN) 5-325 MG tablet Take 1-2 tablets by mouth every 6 (six) hours as needed for moderate pain.  . hydrocortisone 2.5 % cream Apply 1 application topically daily as needed for rash.  . ketoconazole (NIZORAL) 2 % shampoo Apply 1 application topically once a week.   . levETIRAcetam (KEPPRA) 250 MG tablet Take 250 mg by mouth 2 (two) times daily.   . Multiple Vitamin (MULTIVITAMIN WITH MINERALS) TABS tablet Take 1 tablet by mouth daily at 2 PM.  . nystatin cream (MYCOSTATIN) Apply 1 application topically 2 (two) times daily. (Patient taking differently: Apply 1 application topically 2 (two) times daily as needed (yeast). )  . traMADol (ULTRAM) 50 MG tablet Take 1 tablet (50 mg total) by mouth 2 (two) times daily. (Patient not taking: Reported on 06/11/2019)   No facility-administered encounter medications on file as of 06/11/2019.    Activities of Daily Living In your present state of health, do you have any difficulty performing the following activities: 06/11/2019 02/21/2019  Hearing? N -  Comment Wears bilateral hearing aids. -  Vision? N -  Difficulty concentrating or making decisions? Y -  Walking or climbing stairs? Y -  Comment Due to knee pains -  Dressing or bathing? N -  Doing errands, shopping? N N  Preparing Food and eating ? N -  Using the Toilet? N -  In the past six months, have you accidently leaked urine? N -  Do you have problems with loss of bowel control? N -  Managing your Medications? N -  Managing your Finances? N -  Housekeeping or managing your Housekeeping? N -  Some recent data might be hidden    Patient Care Team: [name]., MD as PCP - General (Family Medicine) Birder Robson, MD as Referring Physician (Ophthalmology) Hollice Espy, MD as Consulting Physician (Urology) Reche Dixon, PA-C as Consulting Physician (Orthopedic Surgery) Jonathon Bellows, MD as Consulting Physician (Gastroenterology) Vladimir Crofts, MD as Consulting Physician (Neurology) Samara Deist, DPM as Referring Physician (Podiatry)    Assessment:   This is a routine wellness examination for Nikkia.  Exercise Activities and Dietary recommendations Current Exercise Habits: The patient does not participate in regular exercise at present, Exercise limited by: orthopedic condition(s)  Goals    . DIET - INCREASE WATER INTAKE  Recommend increasing water intake to 4-6 glasses a day.        Fall Risk: Fall Risk  06/11/2019 05/16/2018 04/24/2017 04/04/2016 01/14/2015  Falls in the past year? 0 0 Yes No No  Number falls in past yr: 0 - 1 - -  Injury with Fall? 0 - No - -    FALL RISK PREVENTION PERTAINING TO THE HOME:  Any stairs in or around the home? Yes  If so, are there any without handrails? No   Home free of loose throw rugs in walkways, pet beds, electrical cords, etc? Yes  Adequate lighting in your home to reduce risk of falls? Yes   ASSISTIVE DEVICES UTILIZED TO PREVENT FALLS:  Life alert? No  Use of a cane, walker or w/c? Yes  Grab bars in the bathroom? Yes  Shower chair or bench in shower? Yes  Elevated toilet seat or a handicapped toilet? Yes    TIMED UP AND GO:  Was the test performed? No .    Depression Screen PHQ 2/9 Scores 06/11/2019 05/16/2018 04/24/2017 04/04/2016  PHQ - 2 Score 0 0 0 0     Cognitive Function     6CIT Screen 06/11/2019 04/24/2017 04/04/2016  What Year? 0 points 0 points 0 points  What month? 0 points 0 points 0 points  What time? 0 points 0 points 0 points  Count back from 20 0 points 0 points 0 points  Months in reverse 0 points 0 points 0 points  Repeat phrase 0 points 0 points 0 points  Total Score 0 0 0    Immunization History  Administered Date(s) Administered  . Influenza, High Dose Seasonal PF 01/14/2015, 12/05/2016, 12/03/2017  . Influenza-Unspecified 01/14/2019  . PFIZER SARS-COV-2 Vaccination 03/29/2019, 04/22/2019  . Pneumococcal Conjugate-13 09/08/2013   . Pneumococcal Polysaccharide-23 01/04/2011    Qualifies for Shingles Vaccine? Yes . Due for Shingrix. Pt has been advised to call insurance company to determine out of pocket expense. Advised may also receive vaccine at local pharmacy or Health Dept. Verbalized acceptance and understanding.  Tdap: Although this vaccine is not a covered service during a Wellness Exam, does the patient still wish to receive this vaccine today?  No . Advised may receive this vaccine at local pharmacy or Health Dept. Aware to provide a copy of the vaccination record if obtained from local pharmacy or Health Dept. Verbalized acceptance and understanding.  Flu Vaccine: Up to date  Pneumococcal Vaccine: Completed series  Screening Tests Health Maintenance  Topic Date Due  . DEXA SCAN  01/05/2018  . TETANUS/TDAP  06/10/2020 (Originally 06/06/1954)  . INFLUENZA VACCINE  Completed  . PNA vac Low Risk Adult  Completed    Cancer Screenings:  Colorectal Screening: No longer required.   Mammogram: No longer required.   Bone Density: Completed 01/06/16. Results reflect OSTEOPOROSIS. Repeat every 2 years. Ordered 01/14/19. Pt plans to f/u with [facility] to schedule this scan this year.   Lung Cancer Screening: (Low Dose CT Chest recommended if Age 62-80 years, 30 pack-year currently smoking OR have quit w/in 15years.) does not qualify.   Additional Screening:  Vision Screening: Recommended annual ophthalmology exams for early detection of glaucoma and other disorders of the eye.  Dental Screening: Recommended annual dental exams for proper oral hygiene  Community Resource Referral:  CRR required this visit?  No       Plan:  I have personally reviewed and addressed the Medicare Annual Wellness questionnaire and have noted the following in  the patient's chart:  A. Medical and social history B. Use of alcohol, tobacco or illicit drugs  C. Current medications and supplements D. Functional ability  and status E.  Nutritional status F.  Physical activity G. Advance directives H. List of other physicians I.  Hospitalizations, surgeries, and ER visits in previous 12 months J.  [place] such as hearing and vision if needed, cognitive and depression L. Referrals and appointments   In addition, I have reviewed and discussed with patient certain preventive protocols, quality metrics, and best practice recommendations. A written personalized care plan for preventive services as well as general preventive health recommendations were provided to patient. Nurse Health Advisor  Signed,    Mihailo Sage [place], [place]  [id number] Nurse Health Advisor   Nurse Notes: Pt plans to call  Imaging to schedule her DEXA scan this year.

## 2019-06-11 ENCOUNTER — Other Ambulatory Visit: Payer: Self-pay

## 2019-06-11 ENCOUNTER — Ambulatory Visit (INDEPENDENT_AMBULATORY_CARE_PROVIDER_SITE_OTHER): Payer: Medicare Other

## 2019-06-11 DIAGNOSIS — Z0000 Encounter for general adult medical examination without abnormal findings: Secondary | ICD-10-CM

## 2019-06-11 NOTE — Patient Instructions (Signed)
Elizabeth Trujillo , Thank you for taking time to come for your Medicare Wellness Visit. I appreciate your ongoing commitment to your health goals. Please review the following plan we discussed and let me know if I can assist you in the future.   Screening recommendations/referrals: Colonoscopy: No longer required.  Mammogram: No longer required.  Bone Density: Ordered 01/2019. Pt to call and set up apt for this year. Recommended yearly ophthalmology/optometry visit for glaucoma screening and checkup Recommended yearly dental visit for hygiene and checkup  Vaccinations: Influenza vaccine: Up to date Pneumococcal vaccine: Completed series Tdap vaccine: Pt declines today.  Shingles vaccine: Pt declines today.     Advanced directives: Currently on file  Conditions/risks identified: Recommend to increase water intake to 6-8 8 oz glasses a day.  Next appointment: 09/18/19 @ 1:20 PM with Dr Rosanna Randy. Declined scheduling an AWV for 2022 at this time.   Preventive Care 8 Years and Older, Female Preventive care refers to lifestyle choices and visits with your health care provider that can promote health and wellness. What does preventive care include?  A yearly physical exam. This is also called an annual well check.  Dental exams once or twice a year.  Routine eye exams. Ask your health care provider how often you should have your eyes checked.  Personal lifestyle choices, including:  Daily care of your teeth and gums.  Regular physical activity.  Eating a healthy diet.  Avoiding tobacco and drug use.  Limiting alcohol use.  Practicing safe sex.  Taking low-dose aspirin every day.  Taking vitamin and mineral supplements as recommended by your health care provider. What happens during an annual well check? The services and screenings done by your health care provider during your annual well check will depend on your age, overall health, lifestyle risk factors, and family history of  disease. Counseling  Your health care provider may ask you questions about your:  Alcohol use.  Tobacco use.  Drug use.  Emotional well-being.  Home and relationship well-being.  Sexual activity.  Eating habits.  History of falls.  Memory and ability to understand (cognition).  Work and work Statistician.  Reproductive health. Screening  You may have the following tests or measurements:  Height, weight, and BMI.  Blood pressure.  Lipid and cholesterol levels. These may be checked every 5 years, or more frequently if you are over 109 years old.  Skin check.  Lung cancer screening. You may have this screening every year starting at age 61 if you have a 30-pack-year history of smoking and currently smoke or have quit within the past 15 years.  Fecal occult blood test (FOBT) of the stool. You may have this test every year starting at age 64.  Flexible sigmoidoscopy or colonoscopy. You may have a sigmoidoscopy every 5 years or a colonoscopy every 10 years starting at age 44.  Hepatitis C blood test.  Hepatitis B blood test.  Sexually transmitted disease (STD) testing.  Diabetes screening. This is done by checking your blood sugar (glucose) after you have not eaten for a while (fasting). You may have this done every 1-3 years.  Bone density scan. This is done to screen for osteoporosis. You may have this done starting at age 42.  Mammogram. This may be done every 1-2 years. Talk to your health care provider about how often you should have regular mammograms. Talk with your health care provider about your test results, treatment options, and if necessary, the need for more tests. Vaccines  Your health care provider may recommend certain vaccines, such as:  Influenza vaccine. This is recommended every year.  Tetanus, diphtheria, and acellular pertussis (Tdap, Td) vaccine. You may need a Td booster every 10 years.  Zoster vaccine. You may need this after age  74.  Pneumococcal 13-valent conjugate (PCV13) vaccine. One dose is recommended after age 19.  Pneumococcal polysaccharide (PPSV23) vaccine. One dose is recommended after age 2. Talk to your health care provider about which screenings and vaccines you need and how often you need them. This information is not intended to replace advice given to you by your health care provider. Make sure you discuss any questions you have with your health care provider. Document Released: 03/26/2015 Document Revised: 11/17/2015 Document Reviewed: 12/29/2014 Elsevier Interactive Patient Education  2017 [place] Prevention in the Home Falls can cause injuries. They can happen to people of all ages. There are many things you can do to make your home safe and to help prevent falls. What can I do on the outside of my home?  Regularly fix the edges of walkways and driveways and fix any cracks.  Remove anything that might make you trip as you walk through a door, such as a raised step or threshold.  Trim any bushes or trees on the path to your home.  Use bright outdoor lighting.  Clear any walking paths of anything that might make someone trip, such as rocks or tools.  Regularly check to see if handrails are loose or broken. Make sure that both sides of any steps have handrails.  Any raised decks and porches should have guardrails on the edges.  Have any leaves, snow, or ice cleared regularly.  Use sand or salt on walking paths during winter.  Clean up any spills in your garage right away. This includes oil or grease spills. What can I do in the bathroom?  Use night lights.  Install grab bars by the toilet and in the tub and shower. Do not use towel bars as grab bars.  Use non-skid mats or decals in the tub or shower.  If you need to sit down in the shower, use a plastic, non-slip stool.  Keep the floor dry. Clean up any water that spills on the floor as soon as it happens.  Remove  soap buildup in the tub or shower regularly.  Attach bath mats securely with double-sided non-slip rug tape.  Do not have throw rugs and other things on the floor that can make you trip. What can I do in the bedroom?  Use night lights.  Make sure that you have a light by your bed that is easy to reach.  Do not use any sheets or blankets that are too big for your bed. They should not hang down onto the floor.  Have a firm chair that has side arms. You can use this for support while you get dressed.  Do not have throw rugs and other things on the floor that can make you trip. What can I do in the kitchen?  Clean up any spills right away.  Avoid walking on wet floors.  Keep items that you use a lot in easy-to-reach places.  If you need to reach something above you, use a strong step stool that has a grab bar.  Keep electrical cords out of the way.  Do not use floor polish or wax that makes floors slippery. If you must use wax, use non-skid floor wax.  Do  not have throw rugs and other things on the floor that can make you trip. What can I do with my stairs?  Do not leave any items on the stairs.  Make sure that there are handrails on both sides of the stairs and use them. Fix handrails that are broken or loose. Make sure that handrails are as long as the stairways.  Check any carpeting to make sure that it is firmly attached to the stairs. Fix any carpet that is loose or worn.  Avoid having throw rugs at the top or bottom of the stairs. If you do have throw rugs, attach them to the floor with carpet tape.  Make sure that you have a light switch at the top of the stairs and the bottom of the stairs. If you do not have them, ask someone to add them for you. What else can I do to help prevent falls?  Wear shoes that:  Do not have high heels.  Have rubber bottoms.  Are comfortable and fit you well.  Are closed at the toe. Do not wear sandals.  If you use a  stepladder:  Make sure that it is fully opened. Do not climb a closed stepladder.  Make sure that both sides of the stepladder are locked into place.  Ask someone to hold it for you, if possible.  Clearly mark and make sure that you can see:  Any grab bars or handrails.  First and last steps.  Where the edge of each step is.  Use tools that help you move around (mobility aids) if they are needed. These include:  Canes.  Walkers.  Scooters.  Crutches.  Turn on the lights when you go into a dark area. Replace any light bulbs as soon as they burn out.  Set up your furniture so you have a clear path. Avoid moving your furniture around.  If any of your floors are uneven, fix them.  If there are any pets around you, be aware of where they are.  Review your medicines with your doctor. Some medicines can make you feel dizzy. This can increase your chance of falling. Ask your doctor what other things that you can do to help prevent falls. This information is not intended to replace advice given to you by your health care provider. Make sure you discuss any questions you have with your health care provider. Document Released: 12/24/2008 Document Revised: 08/05/2015 Document Reviewed: 04/03/2014 Elsevier Interactive Patient Education  2017 [employer].

## 2019-06-16 ENCOUNTER — Telehealth: Payer: Self-pay | Admitting: Licensed Clinical Social Worker

## 2019-06-16 NOTE — Telephone Encounter (Signed)
Revealed negative genetic testing.  We discussed that we do not know why she has had cancer or why there is cancer in the family. It could be due to a different gene that we are not testing, or something our current technology cannot pick up.  It will be important for her to keep in contact with genetics to learn if additional testing may be needed in the future.  

## 2019-06-17 ENCOUNTER — Encounter: Payer: Self-pay | Admitting: Licensed Clinical Social Worker

## 2019-06-17 ENCOUNTER — Ambulatory Visit: Payer: Self-pay | Admitting: Licensed Clinical Social Worker

## 2019-06-17 DIAGNOSIS — Z8554 Personal history of malignant neoplasm of ureter: Secondary | ICD-10-CM

## 2019-06-17 DIAGNOSIS — C50912 Malignant neoplasm of unspecified site of left female breast: Secondary | ICD-10-CM

## 2019-06-17 DIAGNOSIS — Z8052 Family history of malignant neoplasm of bladder: Secondary | ICD-10-CM

## 2019-06-17 DIAGNOSIS — Z1379 Encounter for other screening for genetic and chromosomal anomalies: Secondary | ICD-10-CM | POA: Insufficient documentation

## 2019-06-17 DIAGNOSIS — Z170 Estrogen receptor positive status [ER+]: Secondary | ICD-10-CM

## 2019-06-17 DIAGNOSIS — Z803 Family history of malignant neoplasm of breast: Secondary | ICD-10-CM

## 2019-06-17 DIAGNOSIS — Z801 Family history of malignant neoplasm of trachea, bronchus and lung: Secondary | ICD-10-CM

## 2019-06-17 NOTE — Progress Notes (Signed)
HPI:  Elizabeth Trujillo was previously seen in the [facility] due to a personal and family history of cancer and concerns regarding a hereditary predisposition to cancer. Please refer to our prior cancer genetics clinic note for more information regarding our discussion, assessment and recommendations, at the time. Elizabeth Trujillo recent genetic test results were disclosed to her, as were recommendations warranted by these results. These results and recommendations are discussed in more detail below.  CANCER HISTORY:  Oncology History   No history exists.    FAMILY HISTORY:  We obtained a detailed, 4-generation family history.  Significant diagnoses are listed below: Family History  Problem Relation Age of Onset  . Glaucoma Mother   . Kidney disease Mother   . Arthritis Mother   . Stroke Mother   . Breast cancer Mother 70  . Bladder Cancer Mother 42  . Heart disease Father   . Cancer Brother 63       lung cancer, smoker  . Breast cancer Cousin        3 mat cousins dx late 44s  . Breast cancer Cousin        1 mat cousin dx 29s-60s   Elizabeth Trujillo has 2 daughters, Lavella Lemons, 44 and [place], 44 and 1 son, [place]i, 60. None have had cancer. Elizabeth Trujillo did have genetic testing several years ago and was negative for BRCA1/BRCA2, but patient does not have copy of test report. Elizabeth Trujillo was also recently diagnosed with ADH. Patient had 1 paternal half brother, Fritz Pickerel, who was diagnosed with lung cancer at 46 and died at 27. He had history of smoking.  Elizabeth Trujillo mother was diagnosed with bladder cancer around age 48 and breast cancer around age 49, she passed at 71. Patient had 2 maternal unlces and 4 maternal aunts, none had cancer. She has 3 maternal cousins who had breast cancer in their late 12s, 2 are sisters. No known cancers in maternal grandparents.  Elizabeth Trujillo father died at 40 of a heart attack. Patient had 2 paternal uncles and 4 paternal aunts, none had cancer. One paternal  cousin had breast cancer in her 65s-60s. Paternal grandparents did not have cancer.  Elizabeth Trujillo is aware of previous family history of genetic testing for hereditary cancer risks. Patient's maternal ancestors are of [place] descent, and paternal ancestors are of unknown descent. There is no reported Ashkenazi Jewish ancestry. There is no known consanguinity.  GENETIC TEST RESULTS: Genetic testing reported out on 06/15/2019 through the [facility] Common Hereditary cancer panel found no pathogenic mutations.   The Common Hereditary Cancers Panel offered by [facility] includes sequencing and/or deletion duplication testing of the following 48 genes: APC, ATM, AXIN2, BARD1, BMPR1A, BRCA1, BRCA2, BRIP1, CDH1, CDKN2A (p14ARF), CDKN2A (p16INK4a), CKD4, CHEK2, CTNNA1, DICER1, EPCAM (Deletion/duplication testing only), GREM1 (promoter region deletion/duplication testing only), KIT, MEN1, MLH1, MSH2, MSH3, MSH6, MUTYH, NBN, NF1, NHTL1, PALB2, PDGFRA, PMS2, POLD1, POLE, PTEN, RAD50, RAD51C, RAD51D, RNF43, SDHB, SDHC, SDHD, SMAD4, SMARCA4. STK11, TP53, TSC1, TSC2, and VHL.  The following genes were evaluated for sequence changes only: SDHA and HOXB13 c.251G>A variant only.  The test report has been scanned into EPIC and is located under the Molecular Pathology section of the Results Review tab.  A portion of the result report is included below for reference.     We discussed with Elizabeth Trujillo that because current genetic testing is not perfect, it is possible there may be a gene mutation in one of these genes that current testing  cannot detect, but that chance is small.  We also discussed, that there could be another gene that has not yet been discovered, or that we have not yet tested, that is responsible for the cancer diagnoses in the family. It is also possible there is a hereditary cause for the cancer in the family that Elizabeth Trujillo did not inherit and therefore was not identified in her testing.  Therefore, it is  important to remain in touch with cancer genetics in the future so that we can continue to offer Elizabeth Trujillo the most up to date genetic testing.   ADDITIONAL GENETIC TESTING: We discussed with Elizabeth Trujillo that her genetic testing was fairly extensive.  If there are genes identified to increase cancer risk that can be analyzed in the future, we would be happy to discuss and coordinate this testing at that time.    CANCER SCREENING RECOMMENDATIONS: Elizabeth Trujillo test result is considered negative (normal).  This means that we have not identified a hereditary cause for herpersonal and family history of cancer at this time. Most cancers happen by chance and this negative test suggests that her cancer may fall into this category.    While reassuring, this does not definitively rule out a hereditary predisposition to cancer. It is still possible that there could be genetic mutations that are undetectable by current technology. There could be genetic mutations in genes that have not been tested or identified to increase cancer risk.  Therefore, it is recommended she continue to follow the cancer management and screening guidelines provided by her oncology and primary healthcare provider.   An individual's cancer risk and medical management are not determined by genetic test results alone. Overall cancer risk assessment incorporates additional factors, including personal medical history, family history, and any available genetic information that may result in a personalized plan for cancer prevention and surveillance.  RECOMMENDATIONS FOR FAMILY MEMBERS:  Relatives in this family might be at some increased risk of developing cancer, over the general population risk, simply due to the family history of cancer.  We recommended female relatives in this family have a yearly mammogram beginning at age 41, or 16 years younger than the earliest onset of cancer, an annual clinical breast exam, and perform monthly breast  self-exams. Female relatives in this family should also have a gynecological exam as recommended by their primary provider. All family members should have a colonoscopy by age 36, or as directed by their physicians.  It is also possible there is a hereditary cause for the cancer in Elizabeth Trujillo's family that she did not inherit and therefore was not identified in her.  Based on Elizabeth Trujillo's family history, we recommended her maternal cousins/ relatives have genetic counseling and testing. Elizabeth Trujillo will let us know if we can be of any assistance in coordinating genetic counseling and/or testing for these family members.  FOLLOW-UP: Lastly, we discussed with Elizabeth Trujillo that cancer genetics is a rapidly advancing field and it is possible that new genetic tests will be appropriate for her and/or her family members in the future. We encouraged her to remain in contact with cancer genetics on an annual basis so we can update her personal and family histories and let her know of advances in cancer genetics that may benefit this family.   Our contact number was provided. Ms. Ulrich questions were answered to her satisfaction, and she knows she is welcome to call us at anytime with additional questions or concerns.   [place] Ar  Luther Parody, [place], [facility] Genetic Counselor [email or web address] Phone: [phone]

## 2019-06-23 DIAGNOSIS — M79675 Pain in left toe(s): Secondary | ICD-10-CM | POA: Diagnosis not present

## 2019-06-23 DIAGNOSIS — B351 Tinea unguium: Secondary | ICD-10-CM | POA: Diagnosis not present

## 2019-06-23 DIAGNOSIS — M79674 Pain in right toe(s): Secondary | ICD-10-CM | POA: Diagnosis not present

## 2019-06-23 DIAGNOSIS — M2042 Other hammer toe(s) (acquired), left foot: Secondary | ICD-10-CM | POA: Diagnosis not present

## 2019-07-01 ENCOUNTER — Other Ambulatory Visit: Payer: Self-pay

## 2019-07-01 DIAGNOSIS — C662 Malignant neoplasm of left ureter: Secondary | ICD-10-CM

## 2019-07-02 ENCOUNTER — Other Ambulatory Visit
Admission: RE | Admit: 2019-07-02 | Discharge: 2019-07-02 | Disposition: A | Discharge status: Home / Self Care | Payer: Medicare Other | Source: Home / Self Care | Attending: Urology | Admitting: Urology

## 2019-07-02 ENCOUNTER — Ambulatory Visit
Admission: RE | Admit: 2019-07-02 | Discharge: 2019-07-02 | Disposition: A | Discharge status: Home / Self Care | Payer: Medicare Other | Source: Ambulatory Visit | Attending: Urology | Admitting: Urology

## 2019-07-02 ENCOUNTER — Other Ambulatory Visit: Payer: Self-pay

## 2019-07-02 DIAGNOSIS — C669 Malignant neoplasm of unspecified ureter: Secondary | ICD-10-CM | POA: Diagnosis not present

## 2019-07-02 DIAGNOSIS — D090 Carcinoma in situ of bladder: Secondary | ICD-10-CM | POA: Insufficient documentation

## 2019-07-02 DIAGNOSIS — C662 Malignant neoplasm of left ureter: Secondary | ICD-10-CM

## 2019-07-02 LAB — CREATININE, SERUM
Creatinine, Ser: 0.76 mg/dL (ref 0.44–1.00)
GFR calc Af Amer: >60 mL/min (ref >60)
GFR calc non Af Amer: >60 mL/min (ref >60)

## 2019-07-02 MED ORDER — IOHEXOL 300 MG/ML  SOLN
150.0000 mL | Freq: Once | INTRAMUSCULAR | Status: AC | PRN
  Administered 2019-07-02: 125 mL via INTRAVENOUS

## 2019-07-02 MED ORDER — IOHEXOL 300 MG/ML  SOLN: 150.0000 mL | Freq: Once | INTRAMUSCULAR | Status: DC | PRN

## 2019-07-04 ENCOUNTER — Other Ambulatory Visit: Payer: Self-pay | Admitting: Urology

## 2019-07-08 ENCOUNTER — Other Ambulatory Visit: Payer: Self-pay

## 2019-07-08 DIAGNOSIS — C679 Malignant neoplasm of bladder, unspecified: Secondary | ICD-10-CM

## 2019-07-08 DIAGNOSIS — N390 Urinary tract infection, site not specified: Secondary | ICD-10-CM

## 2019-07-10 NOTE — Progress Notes (Signed)
07/10/19 9:56 AM   Elizabeth Trujillo [birth year] [id number]  Referring provider: [name]., MD [address] [place],  [place] [address] Chief Complaint  Patient presents with  . 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   CT from 07/02/19 revealed no findings of recurrent malignancy and suspected scarring in the left kidney accounting for the cortical thinning and hypodensities in the mid kidney and lower pole, substantially worsened from 08/01/2018.  She underwent ablative therapy and BCG in 2019. She returned to the operating room on 12/20 for surveillance including a cystoscopy and ureteroscopy w/ NED at that time. Personally discussed w/ radiology regarding her distal ureter malignancy.   She is asymptomatic at the moment and reports of never having symptoms upon UTI.   She is allergic to penicillin and does not know whether she used Keflex in the past.   PMH: Past Medical History:  Diagnosis Date  . Arthritis   . Breast cancer ([facility]) 1986   left mastectomy  . Cancer ([facility]) 1986   left breast  . carcinoma in situ of urinary bladder 2019   BCG tx per pt  . Collagen vascular disease ([facility])    RA   . Elevated triglycerides with high cholesterol   . Family history of bladder cancer   . Family history of breast cancer   . Family history of lung cancer   . GERD (gastroesophageal reflux disease)    history of reflux  . History of cancer of ureter   . Hypercholesterolemia   . Hypertension   . 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 Seizure ([facility])    last 08-2018  . Stroke [facility])    TIA    Surgical History: Past Surgical History:  Procedure Laterality Date  . ABDOMINAL HYSTERECTOMY  [id number]   TAH/BSO  . BIOPSY Left  11/11/2018   Procedure: BIOPSY;  Surgeon: Hollice Espy, MD;  Location: [facility] ORS;  Service: Urology;  Laterality: Left;  . BREAST BIOPSY Right 01/23/2013   [place] bx/clip-neg  . BREAST SURGERY Left 07/1984   Mastectomy  . callus removed from left toe    . CATARACT EXTRACTION W/ INTRAOCULAR LENS IMPLANT Left 2015  . CYSTOSCOPY W/ RETROGRADES Left 02/24/2019   Procedure: CYSTOSCOPY WITH RETROGRADE PYELOGRAM;  Surgeon: Hollice Espy, MD;  Location: [facility] ORS;  Service: Urology;  Laterality: Left;  . CYSTOSCOPY WITH BIOPSY N/A 02/24/2019   Procedure: CYSTOSCOPY WITH Bladder BIOPSY;  Surgeon: Hollice Espy, MD;  Location: [facility] ORS;  Service: Urology;  Laterality: N/A;  . CYSTOSCOPY WITH FULGERATION N/A 02/24/2019   Procedure: CYSTOSCOPY WITH FULGERATION;  Surgeon: Hollice Espy, MD;  Location: [facility] ORS;  Service: Urology;  Laterality: N/A;  . CYSTOSCOPY WITH URETEROSCOPY AND STENT PLACEMENT Left 10/14/2018   Procedure: CYSTOSCOPY WITH URETEROSCOPY AND STENT PLACEMENT;  Surgeon: Hollice Espy, MD;  Location: [facility] ORS;  Service: Urology;  Laterality: Left;  . CYSTOSCOPY WITH URETEROSCOPY AND STENT PLACEMENT Left 02/24/2019   Procedure: CYSTOSCOPY WITH URETEROSCOPY AND STENT Exchange;  Surgeon: Hollice Espy, MD;  Location: [facility] ORS;  Service: Urology;  Laterality: Left;  . CYSTOSCOPY/URETEROSCOPY/HOLMIUM LASER/STENT PLACEMENT Left 11/11/2018   Procedure: CYSTOSCOPY/URETEROSCOPY//STENT EXCHANGE;  Surgeon: Hollice Espy, MD;  Location: [facility] ORS;  Service: Urology;  Laterality:  Left;  . EYE SURGERY Left 2013  . FOOT SURGERY Left 04/2013  . HOLMIUM LASER APPLICATION Left [id number]   Procedure: HOLMIUM LASER APPLICATION;  Surgeon: Hollice Espy, MD;  Location: [facility] ORS;  Service: Urology;  Laterality: Left;  . KNEE ARTHROPLASTY Right 11/03/2015   Procedure: COMPUTER ASSISTED TOTAL KNEE ARTHROPLASTY;  Surgeon: Dereck Leep, MD;  Location: [facility] ORS;  Service: Orthopedics;  Laterality: Right;  .  MASTECTOMY Left 1986   positive  . URETERAL BIOPSY Left 10/14/2018   Procedure: URETERAL BIOPSY;  Surgeon: Hollice Espy, MD;  Location: [facility] OR[facility];  Service: Urology;  Laterality: Left;  . URETERAL BIOPSY Left 02/24/2019   Procedure: URETERAL BIOPSY;  Surgeon: Hollice Espy, MD;  Location: [facility];  Service: Urology;  Laterality: Left;    Home Medications:  Allergies as of 07/11/2019      Reactions   Penicillin V Potassium Hives   Has patient had a PCN reaction causing immediate rash, facial/tongue/throat swelling, SOB or lightheadedness with hypotension: no Has patient had a PCN reaction causing severe rash involving mucus membranes or skin necrosis: {no Has patient had a PCN reaction that required hospitalization no Has patient had a PCN reaction occurring within the last 10 years: no If all of the above answers are "NO", then may proceed with Cephalosporin use.   Sulfa Antibiotics Hives      Medication List       Accurate as of July 11, 2019 11:59 PM. If you have any questions, ask your nurse or doctor.        acetaminophen 500 MG tablet Commonly known as: TYLENOL Take 1,000 mg by mouth every 6 (six) hours as needed for moderate pain (pain.).   aspirin EC 81 MG tablet Take 81 mg by mouth at bedtime.   atorvastatin 40 MG tablet Commonly known as: Lipitor Take 1 tablet (40 mg total) by mouth daily. What changed: when to take this   CALCIUM+D3 PO Take 1 tablet by mouth daily at 2 PM.   cephALEXin 500 MG capsule Commonly known as: Keflex Take 1 capsule (500 mg total) by mouth 4 (four) times daily. Started by: Hollice Espy, MD   cholecalciferol 25 MCG (1000 UNIT) tablet Commonly known as: VITAMIN D Take 1,000 Units by mouth at bedtime.   HYDROcodone-acetaminophen 5-325 MG tablet Commonly known as: NORCO/VICODIN Take 1-2 tablets by mouth every 6 (six) hours as needed for moderate pain.   hydrocortisone 2.5 % cream Apply 1 application topically daily as needed  for rash.   ketoconazole 2 % shampoo Commonly known as: NIZORAL Apply 1 application topically once a week.   levETIRAcetam 250 MG tablet Commonly known as: KEPPRA Take 250 mg by mouth 2 (two) times daily.   multivitamin with minerals Tabs tablet Take 1 tablet by mouth daily at 2 PM.   nystatin cream Commonly known as: MYCOSTATIN Apply 1 application topically 2 (two) times daily. What changed:   when to take this  reasons to take this   traMADol 50 MG tablet Commonly known as: ULTRAM Take 1 tablet (50 mg total) by mouth 2 (two) times daily.       Allergies:  Allergies  Allergen Reactions  . Penicillin V Potassium Hives    Has patient had a PCN reaction causing immediate rash, facial/tongue/throat swelling, SOB or lightheadedness with hypotension: no Has patient had a PCN reaction causing severe rash involving mucus membranes or skin necrosis: {no Has patient had a PCN reaction that required hospitalization no Has patient  had a PCN reaction occurring within the last 10 years: no If all of the above answers are "NO", then may proceed with Cephalosporin use.  . Sulfa Antibiotics Hives    Family History: Family History  Problem Relation Age of Onset  . Glaucoma Mother   . Kidney disease Mother   . Arthritis Mother   . Stroke Mother   . Breast cancer Mother 58  . Bladder Cancer Mother 52  . Heart disease Father   . Cancer Brother 25       lung cancer, smoker  . Breast cancer Cousin        3 mat cousins dx late 35s  . Breast cancer Cousin        1 mat cousin dx 90s-60s    Social History:  reports that she has never smoked. She has never used smokeless tobacco. She reports that she does not drink alcohol or use drugs.   Physical Exam: BP (!) 157/83   Pulse 90   Ht 5\' 3"  (1.6 m)   Wt 215 lb (97.5 kg)   BMI 38.09 kg/m   Constitutional:  Alert and oriented, No acute distress. HEENT: [place] AT, moist mucus membranes.  Trachea midline, no masses. Cardiovascular:  No clubbing, cyanosis, or edema. Respiratory: Normal respiratory effort, no increased work of breathing. Skin: No rashes, bruises or suspicious lesions. Neurologic: Grossly intact, no focal deficits, moving all 4 extremities. Psychiatric: Normal mood and affect.  Laboratory Data:  Lab Results  Component Value Date   CREATININE 0.76 07/02/2019   Lab Results  Component Value Date   HGBA1C 6.5 (H) 03/20/2019    Urinalysis UA suspicious for UTI.   Pertinent Imaging: Results for orders placed during the hospital encounter of 07/02/19  CT HEMATURIA WORKUP   Addendum ADDENDUM REPORT: 07/02/2019 17:21  ADDENDUM: The original report was by Dr. Van Clines. The following addendum is by Dr. Van Clines:  This is document the amount of contrast given. It was 125 cc Omnipaque 300.   Electronically Signed   By: Van Clines M.D.   On: 07/02/2019 17:21       Sherryl Barters, MD 07/02/2019  5:24 PM         Narrative CLINICAL DATA:  Bladder and left ureteral cancer treated with BCG 2019.  EXAM: CT ABDOMEN AND PELVIS WITHOUT AND WITH CONTRAST  TECHNIQUE: Multidetector CT imaging of the abdomen and pelvis was performed following the standard protocol before and following the bolus administration of intravenous contrast.  CONTRAST:  Pending  COMPARISON:  08/01/2018  FINDINGS: Lower chest: Left anterior descending coronary artery atherosclerosis. Mild cardiomegaly.  Hepatobiliary: Unremarkable  Pancreas: Unremarkable  Spleen: Unremarkable  Adrenals/Urinary Tract: Suspected scarring in the left kidney accounting for the cortical thinning and hypodensities in the mid kidney and lower pole, substantially worsened from 08/01/2018. I would suggest correlating with urinalysis to exclude the unlikely possibility of pyelonephritis. The ureter is not distended in its distal segment where there was previously tumor and enhancement. No obvious filling defect. No bladder  tumor.  Stomach/Bowel: Sigmoid colon diverticulosis.  Vascular/Lymphatic: Aortoiliac atherosclerotic vascular disease. No pathologic adenopathy.  Reproductive: Uterus absent. Adnexa unremarkable.  Other: No supplemental non-categorized findings.  Musculoskeletal: Lumbar spondylosis and degenerative disc disease. Grade 1 degenerative anterolisthesis of L4 on L5.  IMPRESSION: 1. No findings of recurrent malignancy. 2. Suspected scarring in the left kidney accounting for the cortical thinning and hypodensities in the mid kidney and lower pole, substantially worsened from 08/01/2018. I would suggest  correlating with urinalysis to exclude the unlikely possibility of superimposed pyelonephritis. 3. Other imaging findings of potential clinical significance: Coronary atherosclerosis. Mild cardiomegaly. Sigmoid colon diverticulosis. Lumbar spondylosis and degenerative disc disease. 4. Aortic atherosclerosis.  Aortic Atherosclerosis (ICD10-I70.0).  Electronically Signed: By: Van Clines M.D. On: 07/02/2019 17:06    I have personally reviewed the images and agree with radiologist interpretation.  CT was also discussed personally with the radiologist.  Although the left distal ureter did not fill completely, there is no enhancement, surrounding edema or any other pathologic features to suggest recurrent tumor.  Under distention may be secondary to some mild scarring of the ureter following ablative therapy.  In discussion with the radiologist, he confirmed that CT scan findings were in fact suggestive of this clinical scenario.  Assessment & Plan:    1. Acute cystitis/pyelonephritis  Urine suspicious for infection - will treat with keflex Urine cytology/culture sent    2. Hx of bladder cancer/left distal ureter malignancy  Rescheduled cysto for 2 weeks  CT urogram interpretation as above, no evidence of obvious disease which is reassuring Plan for another ureteroscopy 3 months  from now if cysto/cytology unremarkable.    [facility] [address], [place], [place] [address] [phone]  I, Lucas Mallow, am acting as a scribe for Dr. Hollice Espy,  I have reviewed the above documentation for accuracy and completeness, and I agree with the above.   Hollice Espy, MD

## 2019-07-11 ENCOUNTER — Other Ambulatory Visit: Payer: Self-pay

## 2019-07-11 ENCOUNTER — Other Ambulatory Visit
Admission: RE | Admit: 2019-07-11 | Discharge: 2019-07-11 | Disposition: A | Discharge status: Home / Self Care | Payer: Medicare Other | Attending: Urology | Admitting: Urology

## 2019-07-11 ENCOUNTER — Ambulatory Visit (INDEPENDENT_AMBULATORY_CARE_PROVIDER_SITE_OTHER): Payer: Medicare Other | Admitting: Urology

## 2019-07-11 ENCOUNTER — Encounter: Payer: Self-pay | Admitting: Urology

## 2019-07-11 ENCOUNTER — Other Ambulatory Visit: Payer: Self-pay | Admitting: Urology

## 2019-07-11 VITALS — BP 157/83 | HR 90 | Ht 63.0 in | Wt 215.0 lb

## 2019-07-11 DIAGNOSIS — C679 Malignant neoplasm of bladder, unspecified: Secondary | ICD-10-CM | POA: Insufficient documentation

## 2019-07-11 DIAGNOSIS — C662 Malignant neoplasm of left ureter: Secondary | ICD-10-CM | POA: Diagnosis not present

## 2019-07-11 DIAGNOSIS — N390 Urinary tract infection, site not specified: Secondary | ICD-10-CM | POA: Diagnosis not present

## 2019-07-11 LAB — URINALYSIS, COMPLETE (UACMP) WITH MICROSCOPIC
Bilirubin Urine: NEGATIVE
Glucose, UA: NEGATIVE mg/dL
Ketones, ur: NEGATIVE mg/dL
Nitrite: NEGATIVE
Protein, ur: NEGATIVE mg/dL
Specific Gravity, Urine: 1.02 (ref 1.005–1.030)
pH: 5 (ref 5.0–8.0)

## 2019-07-11 MED ORDER — CEPHALEXIN 500 MG PO CAPS: 500.0000 mg | ORAL_CAPSULE | Freq: Four times a day (QID) | ORAL | 0 refills | Status: DC

## 2019-07-13 LAB — URINE CULTURE: Culture: <10000 — AB

## 2019-07-14 DIAGNOSIS — M2042 Other hammer toe(s) (acquired), left foot: Secondary | ICD-10-CM | POA: Diagnosis not present

## 2019-07-17 LAB — CYTOLOGY - NON PAP

## 2019-07-21 ENCOUNTER — Encounter: Payer: Self-pay | Admitting: *Deleted

## 2019-07-24 ENCOUNTER — Telehealth: Payer: Self-pay

## 2019-07-24 NOTE — Progress Notes (Signed)
   07/25/19   CC:  Chief Complaint  Patient presents with  . 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   CT from 07/02/19 revealed no findings of recurrent malignancy and suspected scarring in the left kidney accounting for the cortical thinning and hypodensities in the mid kidney and lower pole, substantially worsened from 08/01/2018.  She underwent ablative therapy and BCG in 2019. She returned to the operating room on 12/20 for surveillance including a cystoscopy and ureteroscopy w/ NED at that time. Personally discussed w/ radiology regarding her distal ureter malignancy.   She is allergic to penicillin and does not know whether she used Keflex in the past.   Please see previous note for details.   Today's Vitals   07/25/19 1315  BP: 121/81  Pulse: 87  Weight: 222 lb (100.7 kg)   Body mass index is 39.33 kg/m. NED. A&Ox3.   No respiratory distress   Abd soft, NT, ND Normal external genitalia with patent urethral meatus  Cystoscopy Procedure Note  Patient identification was confirmed, informed consent was obtained, and patient was prepped using Betadine solution.  Lidocaine jelly was administered per urethral meatus.    Procedure: - Flexible cystoscope introduced, without any difficulty.   - Thorough search of the bladder revealed:    normal urethral meatus    normal urothelium    no stones    no ulcers     no tumors    no urethral polyps    no trabeculation  - Ureteral orifices were normal in position and appearance.  Post-Procedure: - Patient tolerated the procedure well  Assessment/ Plan:  1. Hx of bladder cancer/left distal ureter malignancy  Cysto unremarkable  CT hematuria reveals no findings of recurrent malignancy  F/u in 3 months to discuss surgery (dx  ureteroscopy)   I, Lucas Mallow, am acting as a scribe for Dr. Hollice Espy,  I have reviewed the above documentation for accuracy and completeness, and I agree with the above.   Hollice Espy, MD

## 2019-07-24 NOTE — Telephone Encounter (Signed)
Copied from [place] [id number]. Topic: General - Other >> Jul 24, 2019  4:23 PM Yvette Rack wrote: Reason for CRM: Pt stated [facility] is waiting for the approval for prescriptions on medical supplies. Pt stated they will not allow her to pick up her supplies until the Rx is received.

## 2019-07-25 ENCOUNTER — Ambulatory Visit (INDEPENDENT_AMBULATORY_CARE_PROVIDER_SITE_OTHER): Payer: Medicare Other | Admitting: Urology

## 2019-07-25 ENCOUNTER — Encounter: Payer: Self-pay | Admitting: Urology

## 2019-07-25 ENCOUNTER — Inpatient Hospital Stay: Admit: 2019-07-25 | Payer: Medicare Other

## 2019-07-25 ENCOUNTER — Other Ambulatory Visit: Payer: Self-pay

## 2019-07-25 ENCOUNTER — Other Ambulatory Visit: Payer: Medicare Other | Admitting: Urology

## 2019-07-25 VITALS — BP 121/81 | HR 87 | Wt 222.0 lb

## 2019-07-25 DIAGNOSIS — C662 Malignant neoplasm of left ureter: Secondary | ICD-10-CM | POA: Diagnosis not present

## 2019-07-25 NOTE — Telephone Encounter (Signed)
Order was faxed 07/23/2019 and we re-faxed it again today.

## 2019-07-25 NOTE — Progress Notes (Signed)
[id number]  °

## 2019-07-28 DIAGNOSIS — B351 Tinea unguium: Secondary | ICD-10-CM | POA: Diagnosis not present

## 2019-07-28 DIAGNOSIS — M79675 Pain in left toe(s): Secondary | ICD-10-CM | POA: Diagnosis not present

## 2019-07-28 DIAGNOSIS — M79674 Pain in right toe(s): Secondary | ICD-10-CM | POA: Diagnosis not present

## 2019-09-12 ENCOUNTER — Other Ambulatory Visit: Payer: Self-pay | Admitting: Family Medicine

## 2019-09-12 MED ORDER — ATORVASTATIN CALCIUM 40 MG PO TABS: 40.0000 mg | ORAL_TABLET | Freq: Every day | ORAL | 5 refills | Status: DC

## 2019-09-12 NOTE — Telephone Encounter (Signed)
Medication: atorvastatin (L IPITOR) 40 MG tablet [[id number]]  ENDED Apt is scheduled 09/18/19 Historical provider is out of the hospital  Has the patient contacted their pharmacy? YES (Agent: If no, request that the patient contact the pharmacy for the refill.) (Agent: If yes, when and what did the pharmacy advise?)  Preferred Pharmacy (with phone number or street name): [place], [place] [facility]  Phone:  [phone] Fax:  [phone]     Agent: Please be advised that RX refills may take up to 3 business days. We ask that you follow-up with your pharmacy.

## 2019-09-12 NOTE — Telephone Encounter (Signed)
Requested medication (s) are due for refill today: Yes  Requested medication (s) are on the active medication list: Yes  Last refill:  09/09/18  Future visit scheduled: Yes  Notes to clinic:  Prescription has expired.    Requested Prescriptions  Pending Prescriptions Disp Refills   atorvastatin (LIPITOR) 40 MG tablet 30 tablet 11    Sig: Take 1 tablet (40 mg total) by mouth daily.      Cardiovascular:  Antilipid - Statins Failed - 09/12/2019  2:53 PM      Failed - Total Cholesterol in normal range and within 360 days    Cholesterol, Total  Date Value Ref Range Status  03/20/2019 221 (H) 100 - 199 mg/dL Final          Failed - Triglycerides in normal range and within 360 days    Triglycerides  Date Value Ref Range Status  03/20/2019 286 (H) 0 - 149 mg/dL Final          Passed - LDL in normal range and within 360 days    LDL Cholesterol (Calc)  Date Value Ref Range Status  12/05/2016 101 (H) mg/dL (calc) Final    Comment:    Reference range: <100 . Desirable range <100 mg/dL for primary prevention;   <70 mg/dL for patients with CHD or diabetic patients  with > or = 2 CHD risk factors. Marland Kitchen LDL-C is now calculated using the Martin-Hopkins  calculation, which is a validated novel method providing  better accuracy than the Friedewald equation in the  estimation of LDL-C.  Cresenciano Genre et al. Annamaria Helling. 2841;324(40): 2061-2068  (http://education.[employer].com/faq/FAQ164)    LDL Chol Calc (NIH)  Date Value Ref Range Status  03/20/2019 125 (H) 0 - 99 mg/dL Final          Passed - HDL in normal range and within 360 days    HDL  Date Value Ref Range Status  03/20/2019 45 >39 mg/dL Final          Passed - Patient is not pregnant      Passed - Valid encounter within last 12 months    Recent Outpatient Visits           5 months ago Essential hypertension   [facility] [name]., MD   10 months ago Urothelial carcinoma [facility])    [facility] [name]., MD   11 months ago Essential hypertension   [facility] [name]., MD   12 months ago TIA (transient ischemic attack)   [facility] [name]., MD   1 year ago Pelvic pain   [facility] [name]., MD       Future Appointments             In 6 days [name]., MD [facility], [place]   In 1 month Hollice Espy, [place]

## 2019-09-17 NOTE — Progress Notes (Deleted)
Established patient visit   [name]   DOB: 08/17/1935   84 y.o. Female  MRN: [id number] Visit Date: 09/18/2019  Today's healthcare provider: [name], MD   No chief complaint on file.  Subjective    HPI  Hypertension, follow-up  BP Readings from Last 3 Encounters:  07/25/19 121/81  07/11/19 (!) 157/83  03/20/19 135/84   Wt Readings from Last 3 Encounters:  07/25/19 222 lb (100.7 kg)  07/11/19 215 lb (97.5 kg)  03/20/19 215 lb (97.5 kg)     She was last seen for hypertension 6 months ago.  BP at that visit was 135/84. Management since that visit includes no changes.  She reports {excellent/good/fair/poor:19665} compliance with treatment. She {is/is not:9024} having side effects. {document side effects if present:1} She is following a {diet:21022986} diet. She {is/is not:9024} exercising. She {does/does not:200015} smoke.  Use of agents associated with hypertension: {bp agents assoc with hypertension:511::"none"}.   Outside blood pressures are {***enter patient reported home BP readings, or 'not being checked':1}.  --------------------------------------------------------------------------------------------------- Lipid/Cholesterol, Follow-up  Last lipid panel Other pertinent labs  Lab Results  Component Value Date   CHOL 221 (H) 03/20/2019   HDL 45 03/20/2019   LDLCALC 125 (H) 03/20/2019   TRIG 286 (H) 03/20/2019   CHOLHDL 4.9 (H) 03/20/2019   Lab Results  Component Value Date   ALT 14 03/20/2019   AST 15 03/20/2019   PLT 371 03/20/2019   TSH 2.260 07/25/2018     She was last seen for this 6 months ago.  Management since that visit includes no changes.  She reports {excellent/good/fair/poor:19665} compliance with treatment. She {is/is not:9024} having side effects. {document side effects if present:1}  Current diet: {diet habits:16563} Current exercise: {exercise  types:16438}  ---------------------------------------------------------------------------------------------------  {Show patient history (optional):23778::" "}   Medications: Outpatient Medications Prior to Visit  Medication Sig  . acetaminophen (TYLENOL) 500 MG tablet Take 1,000 mg by mouth every 6 (six) hours as needed for moderate pain (pain.).   Marland Kitchen aspirin EC 81 MG tablet Take 81 mg by mouth at bedtime.  Marland Kitchen atorvastatin (LIPITOR) 40 MG tablet Take 1 tablet (40 mg total) by mouth daily.  . Calcium Carb-Cholecalciferol (CALCIUM+D3 PO) Take 1 tablet by mouth daily at 2 PM.  . cephALEXin (KEFLEX) 500 MG capsule Take 1 capsule (500 mg total) by mouth 4 (four) times daily.  . cholecalciferol (VITAMIN D) 25 MCG (1000 UT) tablet Take 1,000 Units by mouth at bedtime.  Marland Kitchen HYDROcodone-acetaminophen (NORCO/VICODIN) 5-325 MG tablet Take 1-2 tablets by mouth every 6 (six) hours as needed for moderate pain.  . hydrocortisone 2.5 % cream Apply 1 application topically daily as needed for rash.  . ketoconazole (NIZORAL) 2 % shampoo Apply 1 application topically once a week.   . levETIRAcetam (KEPPRA) 250 MG tablet Take 250 mg by mouth 2 (two) times daily.   . Multiple Vitamin (MULTIVITAMIN WITH MINERALS) TABS tablet Take 1 tablet by mouth daily at 2 PM.  . nystatin cream (MYCOSTATIN) Apply 1 application topically 2 (two) times daily. (Patient taking differently: Apply 1 application topically 2 (two) times daily as needed (yeast). )  . traMADol (ULTRAM) 50 MG tablet Take 1 tablet (50 mg total) by mouth 2 (two) times daily.   No facility-administered medications prior to visit.    Review of Systems  {Heme  Chem  Endocrine  Serology  Results Review (optional):23779::" "}  Objective    There were no vitals taken for this  visit. {Show previous vital signs (optional):23777::" "}  Physical Exam  ***  No results found for any visits on 09/18/19.  Assessment & Plan     ***  No follow-ups on  file.      {provider attestation***:1}   [name], MD  [facility] [phone] (phone) [phone] (fax)  [place]

## 2019-09-18 ENCOUNTER — Ambulatory Visit: Payer: Self-pay | Admitting: Family Medicine

## 2019-09-18 ENCOUNTER — Telehealth: Payer: Self-pay

## 2019-09-18 NOTE — Telephone Encounter (Signed)
Copied from [place] [id number]. Topic: Quick Communication - Appointment Cancellation >> Sep 18, 2019 11:18 AM Lennox Solders wrote: Patient called to cancel appointment scheduled for dr Rosanna Randy. Patient HAS  rescheduled their appointment. Pt cancelled today appt due to weather and she walks with a cane  Route to department's [facility] pool.

## 2019-09-18 NOTE — Telephone Encounter (Signed)
Patient's appointment will be cancelled.

## 2019-09-24 NOTE — Progress Notes (Signed)
Established patient visit   [name]   DOB: 1935-05-27   84 y.o. Female  MRN: [id number] Visit Date: 09/25/2019  Today's healthcare provider: [name], MD   No chief complaint on file.  Subjective    HPI  Patient has had both Covid vaccines.  She wishes to have a breast exam today. She has had loose stools about 3 times per month.  No blood or pain.  No weight loss.  Appetite is good. Hypertension, follow-up  BP Readings from Last 3 Encounters:  09/25/19 139/75  07/25/19 121/81  07/11/19 (!) 157/83   Wt Readings from Last 3 Encounters:  09/25/19 220 lb (99.8 kg)  07/25/19 222 lb (100.7 kg)  07/11/19 215 lb (97.5 kg)     She was last seen for hypertension 6 months ago.  BP at that visit was 135/84. Management since that visit includes; no changes. She reports good compliance with treatment. She is not having side effects.  She is not exercising. She is adherent to low salt diet.   Outside blood pressures are checked occasionally.  She does not smoke.  Use of agents associated with hypertension: none.    Lipid/Cholesterol, follow-up  Last Lipid Panel: Lab Results  Component Value Date   CHOL 221 (H) 03/20/2019   LDLCALC 125 (H) 03/20/2019   HDL 45 03/20/2019   TRIG 286 (H) 03/20/2019    She was last seen for this 6 months ago.  Management since that visit includes; .  She reports good compliance with treatment. She is not having side effects.   Symptoms: No appetite changes No foot ulcerations  No chest pain No chest pressure/discomfort  No dyspnea No orthopnea  No fatigue No lower extremity edema  No palpitations No paroxysmal nocturnal dyspnea  No nausea No numbness or tingling of extremity  No polydipsia No polyuria  No speech difficulty No syncope   She is following a Regular diet. Current exercise: none  Last metabolic panel Lab Results  Component Value Date   GLUCOSE 106 (H) 03/20/2019   NA 142 03/20/2019     K 4.2 03/20/2019   BUN 12 03/20/2019   CREATININE 0.76 07/02/2019   GFRNONAA >60 07/02/2019   GFRAA >60 07/02/2019   CALCIUM 9.2 03/20/2019   AST 15 03/20/2019   ALT 14 03/20/2019   The ASCVD Risk score (Goff DC Jr., et al., 2013) failed to calculate for the following reasons:   The 2013 ASCVD risk score is only valid for ages 76 to 55        Medications: Outpatient Medications Prior to Visit  Medication Sig  . acetaminophen (TYLENOL) 500 MG tablet Take 1,000 mg by mouth every 6 (six) hours as needed for moderate pain (pain.).   Marland Kitchen aspirin EC 81 MG tablet Take 81 mg by mouth at bedtime.  Marland Kitchen atorvastatin (LIPITOR) 40 MG tablet Take 1 tablet (40 mg total) by mouth daily.  . Calcium Carb-Cholecalciferol (CALCIUM+D3 PO) Take 1 tablet by mouth daily at 2 PM.  . cephALEXin (KEFLEX) 500 MG capsule Take 1 capsule (500 mg total) by mouth 4 (four) times daily.  . cholecalciferol (VITAMIN D) 25 MCG (1000 UT) tablet Take 1,000 Units by mouth at bedtime.  Marland Kitchen HYDROcodone-acetaminophen (NORCO/VICODIN) 5-325 MG tablet Take 1-2 tablets by mouth every 6 (six) hours as needed for moderate pain.  . hydrocortisone 2.5 % cream Apply 1 application topically daily as needed for rash.  . ketoconazole (NIZORAL) 2 % shampoo  Apply 1 application topically once a week.   . levETIRAcetam (KEPPRA) 250 MG tablet Take 250 mg by mouth 2 (two) times daily.   . Multiple Vitamin (MULTIVITAMIN WITH MINERALS) TABS tablet Take 1 tablet by mouth daily at 2 PM.  . nystatin cream (MYCOSTATIN) Apply 1 application topically 2 (two) times daily. (Patient taking differently: Apply 1 application topically 2 (two) times daily as needed (yeast). )  . traMADol (ULTRAM) 50 MG tablet Take 1 tablet (50 mg total) by mouth 2 (two) times daily.   No facility-administered medications prior to visit.    Review of Systems  Constitutional: Negative.   Eyes: Negative.   Respiratory: Negative.   Cardiovascular: Negative.    Gastrointestinal: Negative.   Endocrine: Negative.   Musculoskeletal: Negative.   Allergic/Immunologic: Negative.   Psychiatric/Behavioral: Negative.        Objective    BP 139/75   Pulse 76   Temp 98 F (36.7 C)   Resp 16   Wt 220 lb (99.8 kg)   BMI 38.97 kg/m  BP Readings from Last 3 Encounters:  09/25/19 139/75  07/25/19 121/81  07/11/19 (!) 157/83   Wt Readings from Last 3 Encounters:  09/25/19 220 lb (99.8 kg)  07/25/19 222 lb (100.7 kg)  07/11/19 215 lb (97.5 kg)      Physical Exam Vitals reviewed.  Constitutional:      Appearance: She is obese.  HENT:     Right Ear: External ear normal.     Left Ear: External ear normal.  Eyes:     General: No scleral icterus.    Conjunctiva/sclera: Conjunctivae normal.  Cardiovascular:     Rate and Rhythm: Normal rate and regular rhythm.     Heart sounds: Normal heart sounds.  Pulmonary:     Breath sounds: Normal breath sounds.  Chest:     Breasts:        Right: Normal.        Left: Absent.  Abdominal:     Palpations: Abdomen is soft.  Musculoskeletal:     Right lower leg: Edema present.     Left lower leg: Edema present.     Comments: 1+ ankle/pedal edema.  Skin:    General: Skin is warm and dry.  Neurological:     Mental Status: She is alert and oriented to person, place, and time.  Psychiatric:        Mood and Affect: Mood normal.        Behavior: Behavior normal.        Thought Content: Thought content normal.        Judgment: Judgment normal.       No results found for any visits on 09/25/19.  Assessment & Plan     1. Hemorrhoids, unspecified hemorrhoid type  - hydrocortisone (ANUSOL-HC) 25 MG suppository; Place 1 suppository (25 mg total) rectally 2 (two) times daily.  Dispense: 12 suppository; Refill: 0  2. Essential hypertension  - CBC with Differential/Platelet - TSH  3. Hypercholesterolemia  - Comprehensive metabolic panel - Lipid panel  4. Prediabetes  - Hemoglobin A1c  5.  Knee pain, unspecified chronicity, unspecified laterality   6. 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  7. TIA (transient ischemic attack) Followed by neurology.  8. Avitaminosis D   9. History of left breast cancer Clinically patient doing well.  Normal right breast exam.  Scar tissue left is normal   No follow-ups on file.      Theophilus Kinds  Cranford Mon, MD, have reviewed all documentation for this visit. The documentation on 09/27/19 for the exam, diagnosis, procedures, and orders are all accurate and complete.    Marilene Vath Cranford Mon, MD  [facility] [phone] (phone) [phone] (fax)  [place]

## 2019-09-25 ENCOUNTER — Encounter: Payer: Self-pay | Admitting: Family Medicine

## 2019-09-25 ENCOUNTER — Ambulatory Visit (INDEPENDENT_AMBULATORY_CARE_PROVIDER_SITE_OTHER): Payer: Medicare Other | Admitting: Family Medicine

## 2019-09-25 ENCOUNTER — Other Ambulatory Visit: Payer: Self-pay

## 2019-09-25 VITALS — BP 139/75 | HR 76 | Temp 98.0°F | Resp 16 | Wt 220.0 lb

## 2019-09-25 DIAGNOSIS — K649 Unspecified hemorrhoids: Secondary | ICD-10-CM | POA: Diagnosis not present

## 2019-09-25 DIAGNOSIS — I10 Essential (primary) hypertension: Secondary | ICD-10-CM | POA: Diagnosis not present

## 2019-09-25 DIAGNOSIS — E559 Vitamin D deficiency, unspecified: Secondary | ICD-10-CM | POA: Diagnosis not present

## 2019-09-25 DIAGNOSIS — Z8554 Personal history of malignant neoplasm of ureter: Secondary | ICD-10-CM

## 2019-09-25 DIAGNOSIS — Z853 Personal history of malignant neoplasm of breast: Secondary | ICD-10-CM

## 2019-09-25 DIAGNOSIS — M25569 Pain in unspecified knee: Secondary | ICD-10-CM | POA: Diagnosis not present

## 2019-09-25 DIAGNOSIS — R7303 Prediabetes: Secondary | ICD-10-CM

## 2019-09-25 DIAGNOSIS — G459 Transient cerebral ischemic attack, unspecified: Secondary | ICD-10-CM

## 2019-09-25 DIAGNOSIS — E7800 Pure hypercholesterolemia, unspecified: Secondary | ICD-10-CM

## 2019-09-25 MED ORDER — HYDROCORTISONE ACETATE 25 MG RE SUPP: 25.0000 mg | Freq: Two times a day (BID) | RECTAL | 0 refills | Status: DC

## 2019-09-25 NOTE — Patient Instructions (Signed)
Try Probiotic Daily.

## 2019-09-29 DIAGNOSIS — I10 Essential (primary) hypertension: Secondary | ICD-10-CM | POA: Diagnosis not present

## 2019-09-29 DIAGNOSIS — R7303 Prediabetes: Secondary | ICD-10-CM | POA: Diagnosis not present

## 2019-09-29 DIAGNOSIS — E7800 Pure hypercholesterolemia, unspecified: Secondary | ICD-10-CM | POA: Diagnosis not present

## 2019-09-30 LAB — COMPREHENSIVE METABOLIC PANEL
ALT: 20 IU/L (ref 0–32)
AST: 17 IU/L (ref 0–40)
Albumin/Globulin Ratio: 1.5 (ref 1.2–2.2)
Albumin: 3.7 g/dL (ref 3.6–4.6)
Alkaline Phosphatase: 138 IU/L — ABNORMAL HIGH (ref 48–121)
BUN/Creatinine Ratio: 18 (ref 12–28)
BUN: 12 mg/dL (ref 8–27)
Bilirubin Total: 0.7 mg/dL (ref 0.0–1.2)
CO2: 22 mmol/L (ref 20–29)
Calcium: 9 mg/dL (ref 8.7–10.3)
Chloride: 105 mmol/L (ref 96–106)
Creatinine, Ser: 0.66 mg/dL (ref 0.57–1.00)
GFR calc Af Amer: 95 mL/min/{1.73_m2} (ref >59)
GFR calc non Af Amer: 83 mL/min/{1.73_m2} (ref >59)
Globulin, Total: 2.4 g/dL (ref 1.5–4.5)
Glucose: 102 mg/dL — ABNORMAL HIGH (ref 65–99)
Potassium: 4 mmol/L (ref 3.5–5.2)
Sodium: 142 mmol/L (ref 134–144)
Total Protein: 6.1 g/dL (ref 6.0–8.5)

## 2019-09-30 LAB — CBC WITH DIFFERENTIAL/PLATELET
Basophils Absolute: 0.1 10*3/uL (ref 0.0–0.2)
Basos: 1 %
EOS (ABSOLUTE): 0.2 10*3/uL (ref 0.0–0.4)
Eos: 2 %
Hematocrit: 39.9 % (ref 34.0–46.6)
Hemoglobin: 13.7 g/dL (ref 11.1–15.9)
Immature Grans (Abs): 0 10*3/uL (ref 0.0–0.1)
Immature Granulocytes: 0 %
Lymphocytes Absolute: 2.2 10*3/uL (ref 0.7–3.1)
Lymphs: 25 %
MCH: 30.9 pg (ref 26.6–33.0)
MCHC: 34.3 g/dL (ref 31.5–35.7)
MCV: 90 fL (ref 79–97)
Monocytes Absolute: 1 10*3/uL — ABNORMAL HIGH (ref 0.1–0.9)
Monocytes: 11 %
Neutrophils Absolute: 5.5 10*3/uL (ref 1.4–7.0)
Neutrophils: 61 %
Platelets: 301 10*3/uL (ref 150–450)
RBC: 4.44 x10E6/uL (ref 3.77–5.28)
RDW: 14.1 % (ref 11.7–15.4)
WBC: 9 10*3/uL (ref 3.4–10.8)

## 2019-09-30 LAB — TSH: TSH: 1.91 u[IU]/mL (ref 0.450–4.500)

## 2019-09-30 LAB — LIPID PANEL
Chol/HDL Ratio: 5.1 ratio — ABNORMAL HIGH (ref 0.0–4.4)
Cholesterol, Total: 198 mg/dL (ref 100–199)
HDL: 39 mg/dL — ABNORMAL LOW (ref >39)
LDL Chol Calc (NIH): 101 mg/dL — ABNORMAL HIGH (ref 0–99)
Triglycerides: 344 mg/dL — ABNORMAL HIGH (ref 0–149)
VLDL Cholesterol Cal: 58 mg/dL — ABNORMAL HIGH (ref 5–40)

## 2019-09-30 LAB — HEMOGLOBIN A1C
Est. average glucose Bld gHb Est-mCnc: 134 mg/dL
Hgb A1c MFr Bld: 6.3 % — ABNORMAL HIGH (ref 4.8–5.6)

## 2019-10-02 ENCOUNTER — Telehealth: Payer: Self-pay

## 2019-10-02 NOTE — Telephone Encounter (Signed)
Left patient a voicemail with lab results.

## 2019-10-08 IMAGING — CT CT ANGIOGRAPHY HEAD
2 of 10 series · 8 of 35 positions shown · IV contrast (APPLIED)
Comparison: 09/09/2018 carotid ultrasound. 09/08/2018 MRI head.
09/08/2018 CT head.

CLINICAL DATA: 83 y/o  F; slurred speech and altered behavior.

EXAM:
CT ANGIOGRAPHY HEAD AND NECK
TECHNIQUE: Multidetector CT imaging of the head and neck was performed using
the standard protocol during bolus administration of intravenous
contrast. Multiplanar CT image reconstructions and MIPs were
obtained to evaluate the vascular anatomy. Carotid stenosis
measurements (when applicable) are obtained utilizing NASCET
criteria, using the distal internal carotid diameter as the
denominator.
CONTRAST:  75mL OMNIPAQUE IOHEXOL 350 MG/ML SOLN

[Series 8: cta head neck · axial · 0.46mm/px · z∈[-158,-54]mm · 2 of 157 slices shown]
[im 53/157  soft-tissue]
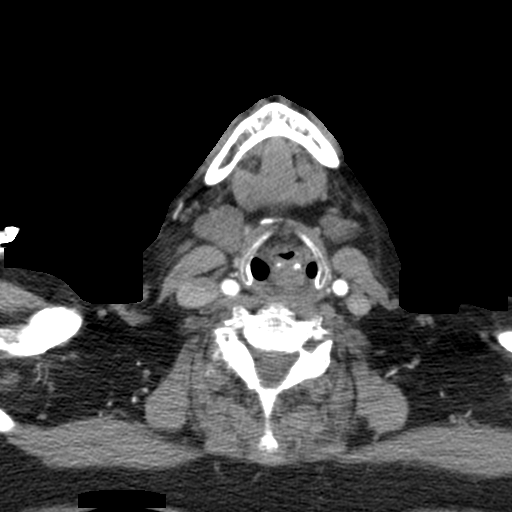
[im 105/157  soft-tissue]
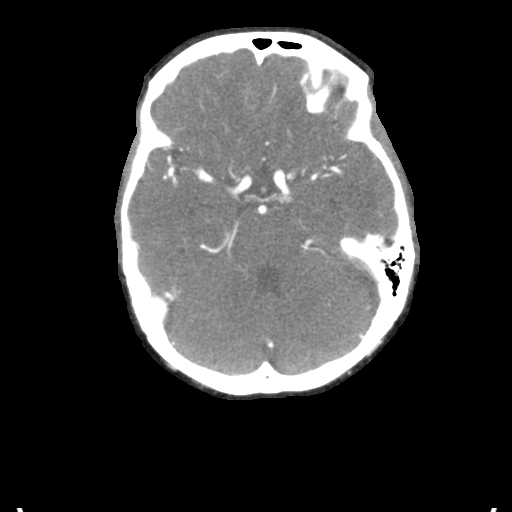

[Series 10: ax thin · axial · 0.37mm/px · z∈[-214,+4]mm · 6 of 306 slices shown]
[im 44/306  soft-tissue]
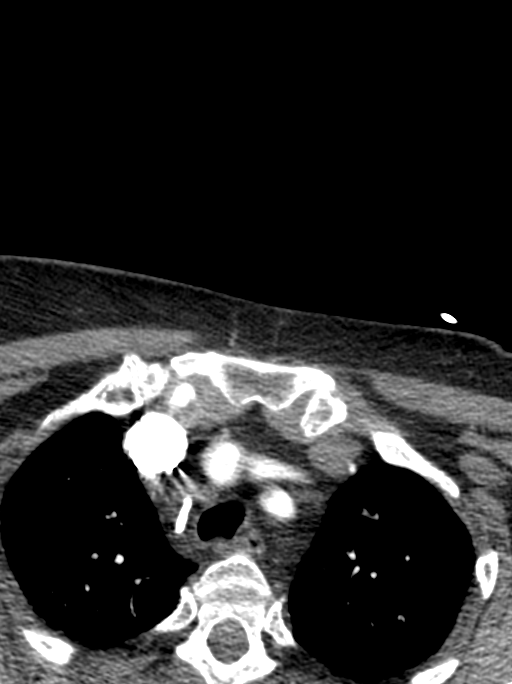
[im 88/306  bone]
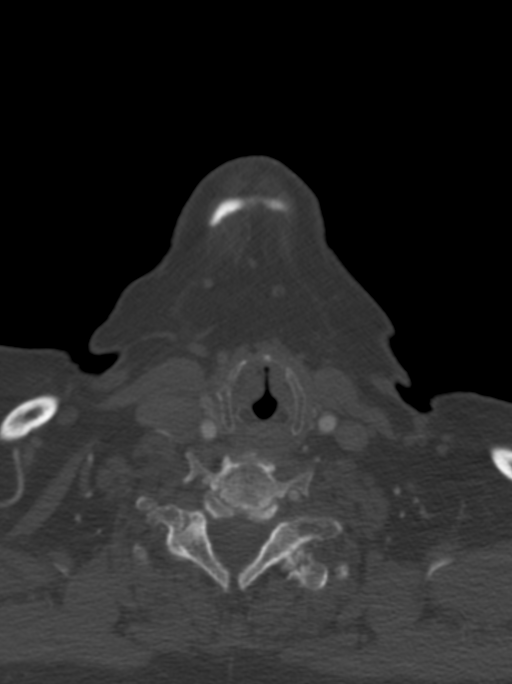
[im 131/306  soft-tissue]
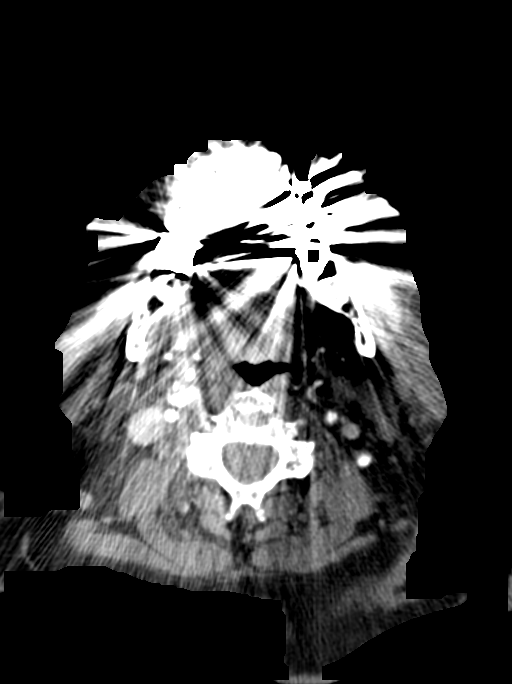
[im 175/306  bone]
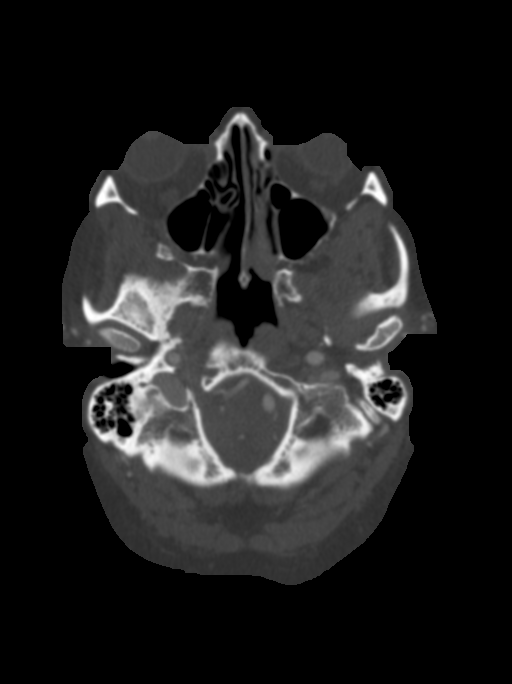
[im 218/306  soft-tissue]
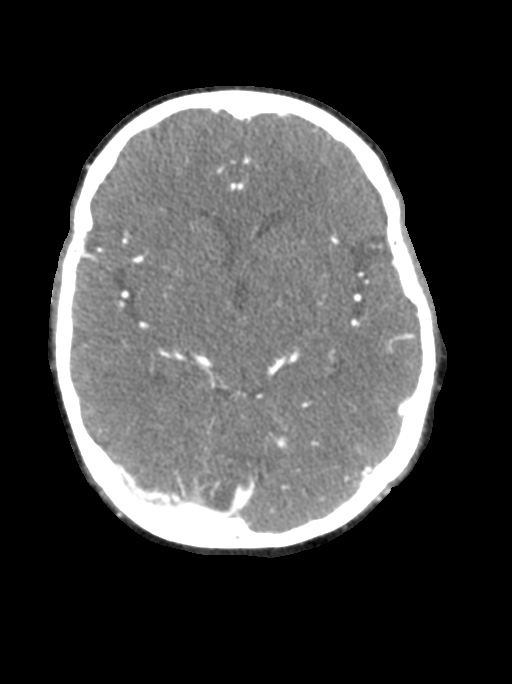
[im 262/306  bone]
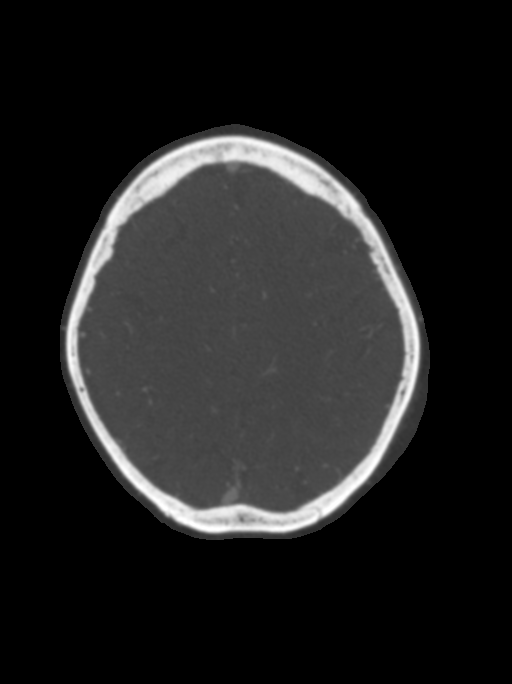

[8 of 35 positions shown; findings below may reference images not displayed]

FINDINGS: CT HEAD FINDINGS

Brain: No evidence of acute infarction, hemorrhage, hydrocephalus,
extra-axial collection or mass lesion/mass effect. Stable
nonspecific white matter hypodensities compatible with chronic
microvascular ischemic changes. Stable of the brain.

Vascular: Below.

Skull: Normal. Negative for fracture or focal lesion.

Sinuses: Imaged portions are clear.

Orbits: No acute finding.

Review of the MIP images confirms the above findings

CTA NECK FINDINGS

Aortic arch: Bovine variant branching. Imaged portion shows no
evidence of aneurysm or dissection. No significant stenosis of the
major arch vessel origins. Mild calcific atherosclerosis of the
aortic arch.

Right carotid system: No evidence of dissection, stenosis (50% or
greater) or occlusion. Mild non stenotic calcific atherosclerosis of
the carotid bifurcation.

Left carotid system: No evidence of dissection, stenosis (50% or
greater) or occlusion. Mild non stenotic calcific atherosclerosis of
the carotid bifurcation.

Vertebral arteries: Dominant. No evidence of dissection, stenosis
(50% or greater) or occlusion.

Skeleton: No acute fracture. Mild-to-moderate spondylosis of the
cervical spine. No high-grade bony spinal canal stenosis.

Other neck: 9 mm nodule left lobe of the thyroid.

Upper chest: Negative.

Review of the MIP images confirms the above findings

CTA HEAD FINDINGS

Anterior circulation: No significant stenosis, proximal occlusion,
aneurysm, or vascular malformation. Mild non stenotic calcific
atherosclerosis of carotid siphons.

Posterior circulation: No significant stenosis, proximal occlusion,
aneurysm, or vascular malformation.

Venous sinuses: As permitted by contrast timing, patent.

Anatomic variants: Fetal right PCA.

Review of the MIP images confirms the above findings
IMPRESSION: 1. Negative stable noncontrast CT of head.
2. Patent carotid and vertebral arteries. No dissection, aneurysm,
or hemodynamically significant stenosis utilizing NASCET criteria.
3. Patent anterior and posterior intracranial circulation. No large
vessel occlusion, aneurysm, or significant stenosis.
4. Mild non stenotic calcific atherosclerosis of aortic arch,
carotid bifurcations, and carotid siphons.

## 2019-10-27 DIAGNOSIS — M2042 Other hammer toe(s) (acquired), left foot: Secondary | ICD-10-CM | POA: Diagnosis not present

## 2019-10-27 DIAGNOSIS — M79674 Pain in right toe(s): Secondary | ICD-10-CM | POA: Diagnosis not present

## 2019-10-27 DIAGNOSIS — M069 Rheumatoid arthritis, unspecified: Secondary | ICD-10-CM | POA: Diagnosis not present

## 2019-10-27 DIAGNOSIS — M79675 Pain in left toe(s): Secondary | ICD-10-CM | POA: Diagnosis not present

## 2019-10-27 DIAGNOSIS — B351 Tinea unguium: Secondary | ICD-10-CM | POA: Diagnosis not present

## 2019-10-30 ENCOUNTER — Other Ambulatory Visit: Payer: Self-pay | Admitting: *Deleted

## 2019-10-30 DIAGNOSIS — N390 Urinary tract infection, site not specified: Secondary | ICD-10-CM

## 2019-10-31 ENCOUNTER — Ambulatory Visit (INDEPENDENT_AMBULATORY_CARE_PROVIDER_SITE_OTHER): Payer: Medicare Other | Admitting: Urology

## 2019-10-31 ENCOUNTER — Other Ambulatory Visit
Admission: RE | Admit: 2019-10-31 | Discharge: 2019-10-31 | Disposition: A | Discharge status: Home / Self Care | Payer: Medicare Other | Attending: Urology | Admitting: Urology

## 2019-10-31 ENCOUNTER — Ambulatory Visit: Payer: Medicare Other | Admitting: Urology

## 2019-10-31 ENCOUNTER — Encounter: Payer: Self-pay | Admitting: Urology

## 2019-10-31 ENCOUNTER — Other Ambulatory Visit: Payer: Self-pay | Admitting: Urology

## 2019-10-31 ENCOUNTER — Other Ambulatory Visit: Payer: Self-pay

## 2019-10-31 ENCOUNTER — Telehealth: Payer: Self-pay

## 2019-10-31 VITALS — BP 151/85 | HR 77 | Ht 63.0 in | Wt 219.0 lb

## 2019-10-31 DIAGNOSIS — N390 Urinary tract infection, site not specified: Secondary | ICD-10-CM

## 2019-10-31 DIAGNOSIS — C679 Malignant neoplasm of bladder, unspecified: Secondary | ICD-10-CM | POA: Diagnosis not present

## 2019-10-31 LAB — URINALYSIS, COMPLETE (UACMP) WITH MICROSCOPIC
Bilirubin Urine: NEGATIVE
Glucose, UA: NEGATIVE mg/dL
Ketones, ur: NEGATIVE mg/dL
Nitrite: NEGATIVE
Protein, ur: NEGATIVE mg/dL
RBC / HPF: >50 RBC/hpf (ref 0–5)
Specific Gravity, Urine: 1.025 (ref 1.005–1.030)
pH: 5.5 (ref 5.0–8.0)

## 2019-10-31 NOTE — H&P (View-Only) (Signed)
10/31/2019 4:32 PM   Elizabeth Trujillo 16-Jul-1935 [id number]  Referring provider: [name]., MD [address] [place],  [place] [address] Chief Complaint  Patient presents with  . Follow-up    13mth follow-up    HPI: Elizabeth Trujillo is a 84 y.o. 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   CT from 07/02/19 revealed no findings of recurrent malignancy and suspected scarring in the left kidney accounting for the cortical thinning and hypodensities in the mid kidney and lower pole, substantially worsened from 08/01/2018.  She underwent ablative therapy and BCG in 2019. She returned to the operating room on 12/20 for surveillance including a cystoscopy and ureteroscopy w/ NED at that time. Personally discussed w/ radiology regarding her distal ureter malignancy.   Cystoscopy on 07/25/2019 was unremarkable.  Most recent cross-sectional imaging in the form of CT urogram on 07/02/2019 with suspected scarring of the left distal ureter but no obvious filling defects or tumor recurrence.  She is allergic to penicillin and does not know whether she used Keflex in the past.    PMH: Past Medical History:  Diagnosis Date  . Arthritis   . Breast cancer ([place]) 1986   left mastectomy  . Cancer ([place]) 1986   left breast  . carcinoma in situ of urinary bladder 2019   BCG tx per pt  . Collagen vascular disease (HCC)    RA   . Elevated triglycerides with high cholesterol   . Family history of bladder cancer   . Family history of breast cancer   . Family history of lung cancer   . GERD (gastroesophageal reflux disease)    history of reflux  . History of cancer of ureter   . Hypercholesterolemia   . Hypertension   . Primary ureteral papillary carcinoma, left ([place]) 2019   BCG  tx per pt  . Seizure ([place])    last 08-2018  . Stroke [facility])    TIA     Surgical History: Past Surgical History:  Procedure Laterality Date  . ABDOMINAL HYSTERECTOMY  035009   TAH/BSO  . BIOPSY Left 11/11/2018   Procedure: BIOPSY;  Surgeon: Hollice Espy, MD;  Location: [facility] ORS;  Service: Urology;  Laterality: Left;  . BREAST BIOPSY Right 01/23/2013   [place] bx/clip-neg  . BREAST SURGERY Left 07/1984   Mastectomy  . callus removed from left toe    . CATARACT EXTRACTION W/ INTRAOCULAR LENS IMPLANT Left 2015  . CYSTOSCOPY W/ RETROGRADES Left 02/24/2019   Procedure: CYSTOSCOPY WITH RETROGRADE PYELOGRAM;  Surgeon: Hollice Espy, MD;  Location: [facility];  Service: Urology;  Laterality: Left;  . CYSTOSCOPY WITH BIOPSY N/A 02/24/2019   Procedure: CYSTOSCOPY WITH Bladder BIOPSY;  Surgeon: Hollice Espy, MD;  Location: [facility];  Service: Urology;  Laterality: N/A;  . CYSTOSCOPY WITH FULGERATION N/A 02/24/2019   Procedure: CYSTOSCOPY WITH FULGERATION;  Surgeon: Hollice Espy, MD;  Location: [facility];  Service: Urology;  Laterality: N/A;  . CYSTOSCOPY WITH URETEROSCOPY AND STENT PLACEMENT Left 10/14/2018   Procedure: CYSTOSCOPY WITH URETEROSCOPY AND STENT PLACEMENT;  Surgeon: Hollice Espy, MD;  Location: [facility];  Service: Urology;  Laterality: Left;  . CYSTOSCOPY WITH URETEROSCOPY AND STENT PLACEMENT Left 02/24/2019   Procedure: CYSTOSCOPY WITH URETEROSCOPY AND STENT Exchange;  Surgeon: Hollice Espy, MD;  Location: [facility];  Service: Urology;  Laterality: Left;  . CYSTOSCOPY/URETEROSCOPY/HOLMIUM  LASER/STENT PLACEMENT Left 11/11/2018   Procedure: CYSTOSCOPY/URETEROSCOPY//STENT EXCHANGE;  Surgeon: Hollice Espy, MD;  Location: [facility];  Service: Urology;  Laterality: Left;  . EYE SURGERY Left 2013  . FOOT SURGERY Left 04/2013  . HOLMIUM LASER APPLICATION Left 09/14/9447   Procedure: HOLMIUM LASER APPLICATION;  Surgeon: Hollice Espy, MD;  Location: [facility];  Service: Urology;  Laterality: Left;  . KNEE ARTHROPLASTY Right 11/03/2015   Procedure:  COMPUTER ASSISTED TOTAL KNEE ARTHROPLASTY;  Surgeon: Dereck Leep, MD;  Location: [facility];  Service: Orthopedics;  Laterality: Right;  . MASTECTOMY Left 1986   positive  . URETERAL BIOPSY Left 10/14/2018   Procedure: URETERAL BIOPSY;  Surgeon: Hollice Espy, MD;  Location: [facility];  Service: Urology;  Laterality: Left;  . URETERAL BIOPSY Left 02/24/2019   Procedure: URETERAL BIOPSY;  Surgeon: Hollice Espy, MD;  Location: [facility];  Service: Urology;  Laterality: Left;    Home Medications:  Allergies as of 10/31/2019      Reactions   Penicillin V Potassium Hives   Has patient had a PCN reaction causing immediate rash, facial/tongue/throat swelling, SOB or lightheadedness with hypotension: no Has patient had a PCN reaction causing severe rash involving mucus membranes or skin necrosis: {no Has patient had a PCN reaction that required hospitalization no Has patient had a PCN reaction occurring within the last 10 years: no If all of the above answers are "NO", then may proceed with Cephalosporin use.   Sulfa Antibiotics Hives      Medication List       Accurate as of October 31, 2019 11:59 PM. If you have any questions, ask your nurse or doctor.        acetaminophen 500 MG tablet Commonly known as: TYLENOL Take 1,000 mg by mouth every 6 (six) hours as needed for moderate pain (pain.).   aspirin EC 81 MG tablet Take 81 mg by mouth at bedtime.   atorvastatin 40 MG tablet Commonly known as: Lipitor Take 1 tablet (40 mg total) by mouth daily.   CALCIUM+D3 PO Take 1 tablet by mouth daily at 2 PM.   cephALEXin 500 MG capsule Commonly known as: Keflex Take 1 capsule (500 mg total) by mouth 4 (four) times daily.   cholecalciferol 25 MCG (1000 UNIT) tablet Commonly known as: VITAMIN D Take 1,000 Units by mouth at bedtime.   HYDROcodone-acetaminophen 5-325 MG tablet Commonly known as: NORCO/VICODIN Take 1-2 tablets by mouth every 6 (six) hours as needed for moderate pain.    hydrocortisone 2.5 % cream Apply 1 application topically daily as needed for rash.   hydrocortisone 25 MG suppository Commonly known as: ANUSOL-HC Place 1 suppository (25 mg total) rectally 2 (two) times daily.   ketoconazole 2 % shampoo Commonly known as: NIZORAL Apply 1 application topically once a week.   levETIRAcetam 250 MG tablet Commonly known as: KEPPRA Take 250 mg by mouth 2 (two) times daily.   multivitamin with minerals Tabs tablet Take 1 tablet by mouth daily at 2 PM.   nystatin cream Commonly known as: MYCOSTATIN Apply 1 application topically 2 (two) times daily.   traMADol 50 MG tablet Commonly known as: ULTRAM Take 1 tablet (50 mg total) by mouth 2 (two) times daily.       Allergies:  Allergies  Allergen Reactions  . Penicillin V Potassium Hives    Has patient had a PCN reaction causing immediate rash, facial/tongue/throat swelling, SOB or lightheadedness with hypotension: no Has patient had a PCN reaction causing severe  rash involving mucus membranes or skin necrosis: {no Has patient had a PCN reaction that required hospitalization no Has patient had a PCN reaction occurring within the last 10 years: no If all of the above answers are "NO", then may proceed with Cephalosporin use.  . Sulfa Antibiotics Hives    Family History: Family History  Problem Relation Age of Onset  . Glaucoma Mother   . Kidney disease Mother   . Arthritis Mother   . Stroke Mother   . Breast cancer Mother 70  . Bladder Cancer Mother 80  . Heart disease Father   . Cancer Brother 72       lung cancer, smoker  . Breast cancer Cousin        3 mat cousins dx late 18s  . Breast cancer Cousin        1 mat cousin dx 32s-60s    Social History:  reports that she has never smoked. She has never used smokeless tobacco. She reports that she does not drink alcohol and does not use drugs.   Physical Exam: BP (!) 151/85   Pulse 77   Ht 5\' 3"  (1.6 m)   Wt 219 lb (99.3 kg)    BMI 38.79 kg/m   Constitutional:  Alert and oriented, No acute distress.  Accompanied by husband today. HEENT: [place] AT, moist mucus membranes.  Trachea midline, no masses. Cardiovascular: No clubbing, cyanosis, or edema. Respiratory: Normal respiratory effort, no increased work of breathing. Skin: No rashes, bruises or suspicious lesions. Neurologic: Grossly intact, no focal deficits, moving all 4 extremities. Psychiatric: Normal mood and affect.  Laboratory Data:  Lab Results  Component Value Date   CREATININE 0.66 09/29/2019    Lab Results  Component Value Date   HGBA1C 6.3 (H) 09/29/2019    Assessment & Plan:    1. History of bladder cancer/left distal ureter malignancy  Schedule surveillance ureteroscopy with possible intervention depending on findings.  Most recent cross-sectional imaging is reassuring but not definitive and thus would strongly recommend more aggressive direct surveillance. Patient educated on risk and benefits.  Plan for urine cytology today. -Urine cytology sent.  2. Vaccine counseling  We discussed vaccine boosters given her history of malignancy and BCG/treatments. Discussed locations where she could receive vaccine booster.    [facility] [address] [place], [place] [address] [phone]  I, Selena Batten, am acting as a scribe for Dr. Hollice Espy.  I have reviewed the above documentation for accuracy and completeness, and I agree with the above.   Hollice Espy, MD  I spent 30 total minutes on the day of the encounter including pre-visit review of the medical record, face-to-face time with the patient, and post visit ordering of labs/imaging/tests.

## 2019-10-31 NOTE — Progress Notes (Signed)
10/31/2019 4:32 PM   Elizabeth Trujillo [birth year] [id number]  Referring provider: [name]., MD [address] [place],  [place] [address] Chief Complaint  Patient presents with  . Follow-up    88mth follow-up    HPI: Elizabeth Trujillo is a 84 y.o. 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   CT from 07/02/19 revealed no findings of recurrent malignancy and suspected scarring in the left kidney accounting for the cortical thinning and hypodensities in the mid kidney and lower pole, substantially worsened from 08/01/2018.  She underwent ablative therapy and BCG in 2019. She returned to the operating room on 12/20 for surveillance including a cystoscopy and ureteroscopy w/ NED at that time. Personally discussed w/ radiology regarding her distal ureter malignancy.   Cystoscopy on 07/25/2019 was unremarkable.  Most recent cross-sectional imaging in the form of CT urogram on 07/02/2019 with suspected scarring of the left distal ureter but no obvious filling defects or tumor recurrence.  She is allergic to penicillin and does not know whether she used Keflex in the past.    PMH: Past Medical History:  Diagnosis Date  . Arthritis   . Breast cancer ([place]) 1986   left mastectomy  . Cancer ([place]) 1986   left breast  . carcinoma in situ of urinary bladder 2019   BCG tx per pt  . Collagen vascular disease (HCC)    RA   . Elevated triglycerides with high cholesterol   . Family history of bladder cancer   . Family history of breast cancer   . Family history of lung cancer   . GERD (gastroesophageal reflux disease)    history of reflux  . History of cancer of ureter   . Hypercholesterolemia   . Hypertension   . Primary ureteral papillary carcinoma, left ([place]) 2019   BCG  tx per pt  . Seizure ([place])    last 08-2018  . Stroke [facility])    TIA     Surgical History: Past Surgical History:  Procedure Laterality Date  . ABDOMINAL HYSTERECTOMY  643329   TAH/BSO  . BIOPSY Left 11/11/2018   Procedure: BIOPSY;  Surgeon: Hollice Espy, MD;  Location: [facility];  Service: Urology;  Laterality: Left;  . BREAST BIOPSY Right 01/23/2013   [place] bx/clip-neg  . BREAST SURGERY Left 07/1984   Mastectomy  . callus removed from left toe    . CATARACT EXTRACTION W/ INTRAOCULAR LENS IMPLANT Left 2015  . CYSTOSCOPY W/ RETROGRADES Left 02/24/2019   Procedure: CYSTOSCOPY WITH RETROGRADE PYELOGRAM;  Surgeon: Hollice Espy, MD;  Location: [facility];  Service: Urology;  Laterality: Left;  . CYSTOSCOPY WITH BIOPSY N/A 02/24/2019   Procedure: CYSTOSCOPY WITH Bladder BIOPSY;  Surgeon: Hollice Espy, MD;  Location: [facility];  Service: Urology;  Laterality: N/A;  . CYSTOSCOPY WITH FULGERATION N/A 02/24/2019   Procedure: CYSTOSCOPY WITH FULGERATION;  Surgeon: Hollice Espy, MD;  Location: [facility];  Service: Urology;  Laterality: N/A;  . CYSTOSCOPY WITH URETEROSCOPY AND STENT PLACEMENT Left 10/14/2018   Procedure: CYSTOSCOPY WITH URETEROSCOPY AND STENT PLACEMENT;  Surgeon: Hollice Espy, MD;  Location: [facility];  Service: Urology;  Laterality: Left;  . CYSTOSCOPY WITH URETEROSCOPY AND STENT PLACEMENT Left 02/24/2019   Procedure: CYSTOSCOPY WITH URETEROSCOPY AND STENT Exchange;  Surgeon: Hollice Espy, MD;  Location: [facility];  Service: Urology;  Laterality: Left;  . CYSTOSCOPY/URETEROSCOPY/HOLMIUM  LASER/STENT PLACEMENT Left 11/11/2018   Procedure: CYSTOSCOPY/URETEROSCOPY//STENT EXCHANGE;  Surgeon: Hollice Espy, MD;  Location: [facility];  Service: Urology;  Laterality: Left;  . EYE SURGERY Left 2013  . FOOT SURGERY Left 04/2013  . HOLMIUM LASER APPLICATION Left 07/16/3873   Procedure: HOLMIUM LASER APPLICATION;  Surgeon: Hollice Espy, MD;  Location: [facility];  Service: Urology;  Laterality: Left;  . KNEE ARTHROPLASTY Right 11/03/2015   Procedure:  COMPUTER ASSISTED TOTAL KNEE ARTHROPLASTY;  Surgeon: Dereck Leep, MD;  Location: [facility];  Service: Orthopedics;  Laterality: Right;  . MASTECTOMY Left 1986   positive  . URETERAL BIOPSY Left 10/14/2018   Procedure: URETERAL BIOPSY;  Surgeon: Hollice Espy, MD;  Location: [facility];  Service: Urology;  Laterality: Left;  . URETERAL BIOPSY Left 02/24/2019   Procedure: URETERAL BIOPSY;  Surgeon: Hollice Espy, MD;  Location: [facility];  Service: Urology;  Laterality: Left;    Home Medications:  Allergies as of 10/31/2019      Reactions   Penicillin V Potassium Hives   Has patient had a PCN reaction causing immediate rash, facial/tongue/throat swelling, SOB or lightheadedness with hypotension: no Has patient had a PCN reaction causing severe rash involving mucus membranes or skin necrosis: {no Has patient had a PCN reaction that required hospitalization no Has patient had a PCN reaction occurring within the last 10 years: no If all of the above answers are "NO", then may proceed with Cephalosporin use.   Sulfa Antibiotics Hives      Medication List       Accurate as of October 31, 2019 11:59 PM. If you have any questions, ask your nurse or doctor.        acetaminophen 500 MG tablet Commonly known as: TYLENOL Take 1,000 mg by mouth every 6 (six) hours as needed for moderate pain (pain.).   aspirin EC 81 MG tablet Take 81 mg by mouth at bedtime.   atorvastatin 40 MG tablet Commonly known as: Lipitor Take 1 tablet (40 mg total) by mouth daily.   CALCIUM+D3 PO Take 1 tablet by mouth daily at 2 PM.   cephALEXin 500 MG capsule Commonly known as: Keflex Take 1 capsule (500 mg total) by mouth 4 (four) times daily.   cholecalciferol 25 MCG (1000 UNIT) tablet Commonly known as: VITAMIN D Take 1,000 Units by mouth at bedtime.   HYDROcodone-acetaminophen 5-325 MG tablet Commonly known as: NORCO/VICODIN Take 1-2 tablets by mouth every 6 (six) hours as needed for moderate pain.    hydrocortisone 2.5 % cream Apply 1 application topically daily as needed for rash.   hydrocortisone 25 MG suppository Commonly known as: ANUSOL-HC Place 1 suppository (25 mg total) rectally 2 (two) times daily.   ketoconazole 2 % shampoo Commonly known as: NIZORAL Apply 1 application topically once a week.   levETIRAcetam 250 MG tablet Commonly known as: KEPPRA Take 250 mg by mouth 2 (two) times daily.   multivitamin with minerals Tabs tablet Take 1 tablet by mouth daily at 2 PM.   nystatin cream Commonly known as: MYCOSTATIN Apply 1 application topically 2 (two) times daily.   traMADol 50 MG tablet Commonly known as: ULTRAM Take 1 tablet (50 mg total) by mouth 2 (two) times daily.       Allergies:  Allergies  Allergen Reactions  . Penicillin V Potassium Hives    Has patient had a PCN reaction causing immediate rash, facial/tongue/throat swelling, SOB or lightheadedness with hypotension: no Has patient had a PCN reaction causing severe  rash involving mucus membranes or skin necrosis: {no Has patient had a PCN reaction that required hospitalization no Has patient had a PCN reaction occurring within the last 10 years: no If all of the above answers are "NO", then may proceed with Cephalosporin use.  . Sulfa Antibiotics Hives    Family History: Family History  Problem Relation Age of Onset  . Glaucoma Mother   . Kidney disease Mother   . Arthritis Mother   . Stroke Mother   . Breast cancer Mother 23  . Bladder Cancer Mother 34  . Heart disease Father   . Cancer Brother 15       lung cancer, smoker  . Breast cancer Cousin        3 mat cousins dx late 64s  . Breast cancer Cousin        1 mat cousin dx 9s-60s    Social History:  reports that she has never smoked. She has never used smokeless tobacco. She reports that she does not drink alcohol and does not use drugs.   Physical Exam: BP (!) 151/85   Pulse 77   Ht 5\' 3"  (1.6 m)   Wt 219 lb (99.3 kg)    BMI 38.79 kg/m   Constitutional:  Alert and oriented, No acute distress.  Accompanied by husband today. HEENT: [place] AT, moist mucus membranes.  Trachea midline, no masses. Cardiovascular: No clubbing, cyanosis, or edema. Respiratory: Normal respiratory effort, no increased work of breathing. Skin: No rashes, bruises or suspicious lesions. Neurologic: Grossly intact, no focal deficits, moving all 4 extremities. Psychiatric: Normal mood and affect.  Laboratory Data:  Lab Results  Component Value Date   CREATININE 0.66 09/29/2019    Lab Results  Component Value Date   HGBA1C 6.3 (H) 09/29/2019    Assessment & Plan:    1. History of bladder cancer/left distal ureter malignancy  Schedule surveillance ureteroscopy with possible intervention depending on findings.  Most recent cross-sectional imaging is reassuring but not definitive and thus would strongly recommend more aggressive direct surveillance. Patient educated on risk and benefits.  Plan for urine cytology today. -Urine cytology sent.  2. Vaccine counseling  We discussed vaccine boosters given her history of malignancy and BCG/treatments. Discussed locations where she could receive vaccine booster.    [facility] [address], [place], [place] [address] [phone]  I, Selena Batten, am acting as a scribe for Dr. Hollice Espy.  I have reviewed the above documentation for accuracy and completeness, and I agree with the above.   Hollice Espy, MD  I spent 30 total minutes on the day of the encounter including pre-visit review of the medical record, face-to-face time with the patient, and post visit ordering of labs/imaging/tests.

## 2019-10-31 NOTE — Telephone Encounter (Signed)
Patient called for number for the covid booster . Number given to pt

## 2019-11-02 LAB — URINE CULTURE

## 2019-11-03 ENCOUNTER — Ambulatory Visit: Payer: Self-pay | Attending: Internal Medicine

## 2019-11-03 DIAGNOSIS — Z23 Encounter for immunization: Secondary | ICD-10-CM

## 2019-11-03 NOTE — Progress Notes (Signed)
   Covid-19 Vaccination Clinic  Name:  Elizabeth Trujillo    MRN: [id number] DOB: [birth year]  11/03/2019  [name] was observed post Covid-19 immunization for 15 minutes without incident. She was provided with Vaccine Information Sheet and instruction to access the V-Safe system.   [name] was instructed to call [phone] with any severe reactions post vaccine: [place] Difficulty breathing  . Swelling of face and throat  . A fast heartbeat  . A bad rash all over body  . Dizziness and weakness

## 2019-11-04 ENCOUNTER — Other Ambulatory Visit: Payer: Self-pay | Admitting: Radiology

## 2019-11-04 DIAGNOSIS — C679 Malignant neoplasm of bladder, unspecified: Secondary | ICD-10-CM

## 2019-11-04 DIAGNOSIS — Z01818 Encounter for other preprocedural examination: Secondary | ICD-10-CM

## 2019-11-04 DIAGNOSIS — N390 Urinary tract infection, site not specified: Secondary | ICD-10-CM

## 2019-11-05 LAB — CYTOLOGY, URINE

## 2019-11-14 ENCOUNTER — Other Ambulatory Visit: Payer: Self-pay

## 2019-11-14 ENCOUNTER — Other Ambulatory Visit
Admission: RE | Admit: 2019-11-14 | Discharge: 2019-11-14 | Disposition: A | Discharge status: Home / Self Care | Payer: Medicare Other | Attending: Urology | Admitting: Urology

## 2019-11-14 ENCOUNTER — Other Ambulatory Visit: Payer: Medicare Other

## 2019-11-14 DIAGNOSIS — N390 Urinary tract infection, site not specified: Secondary | ICD-10-CM | POA: Insufficient documentation

## 2019-11-16 LAB — URINE CULTURE: Culture: <10000 — AB

## 2019-11-18 ENCOUNTER — Encounter
Admission: RE | Admit: 2019-11-18 | Discharge: 2019-11-18 | Disposition: A | Discharge status: Home / Self Care | Payer: Medicare Other | Source: Ambulatory Visit | Attending: Urology | Admitting: Urology

## 2019-11-18 ENCOUNTER — Other Ambulatory Visit: Payer: Self-pay

## 2019-11-18 NOTE — Patient Instructions (Signed)
Your procedure is scheduled on: Monday November 24, 2019. Report to Day Surgery inside [facility] 2nd floor. To find out your arrival time please call [phone] between 1PM - 3PM on Friday November 21, 2019.  Remember: Instructions that are not followed completely may result in serious medical risk,  up to and including death, or upon the discretion of your surgeon and anesthesiologist your  surgery may need to be rescheduled.     _X__ 1. Do not eat food after midnight the night before your procedure.                 No chewing gum or hard candies. You may drink clear liquids up to 2 hours                 before you are scheduled to arrive for your surgery- DO not drink clear                 liquids within 2 hours of the start of your surgery.                 Clear Liquids include:  water, apple juice without pulp, clear Gatorade, G2 or                  Gatorade Zero (avoid Red/Purple/Blue), Black Coffee or Tea (Do not add                 anything to coffee or tea).  __X__2.  On the morning of surgery brush your teeth with toothpaste and water, you                may rinse your mouth with mouthwash if you wish.  Do not swallow any toothpaste of mouthwash.     _X__ 3.  No Alcohol for 24 hours before or after surgery.   _X__ 4.  Do Not Smoke or use e-cigarettes For 24 Hours Prior to Your Surgery.                 Do not use any chewable tobacco products for at least 6 hours prior to                 Surgery.  _X__  5.  Do not use any recreational drugs (marijuana, cocaine, heroin, ecstasy, MDMA or other)                For at least one week prior to your surgery.  Combination of these drugs with anesthesia                May have life threatening results.  __X__  7.  Notify your doctor if there is any change in your medical condition      (cold, fever, infections).     Do not wear jewelry, make-up, hairpins, clips or nail polish. Do not wear lotions,  powders, or perfumes. You may wear deodorant. Do not shave 48 hours prior to surgery. Men may shave face and neck. Do not bring valuables to the hospital.    [facility] is not responsible for any belongings or valuables.  Contacts, dentures or bridgework may not be worn into surgery. Leave your suitcase in the car. After surgery it may be brought to your room. For patients admitted to the hospital, discharge time is determined by your treatment team.   Patients discharged the day of surgery will not be allowed to drive home.   Make arrangements for someone to  be with you for the first 24 hours of your Same Day Discharge.   __X__ Take these medicines the morning of surgery with A SIP OF WATER:    1. None   _X___ Stop aspirin as indicated  __X__ Stop Anti-inflammatories such as Ibuprofen, Aleve, Advil, naproxen and or BC powders.    __X__ Stop supplements until after surgery.    __X__ Do not start any herbal supplements before your procedure.    If you have any questions regarding your pre-procedure instructions,  Please call Pre-admit Testing at [phone].

## 2019-11-20 ENCOUNTER — Other Ambulatory Visit: Payer: Self-pay

## 2019-11-20 ENCOUNTER — Encounter
Admission: RE | Admit: 2019-11-20 | Discharge: 2019-11-20 | Disposition: A | Discharge status: Home / Self Care | Payer: Medicare Other | Source: Ambulatory Visit | Attending: Urology | Admitting: Urology

## 2019-11-20 DIAGNOSIS — Z20822 Contact with and (suspected) exposure to covid-19: Secondary | ICD-10-CM | POA: Diagnosis not present

## 2019-11-20 DIAGNOSIS — Z01818 Encounter for other preprocedural examination: Secondary | ICD-10-CM | POA: Insufficient documentation

## 2019-11-20 NOTE — Progress Notes (Signed)
  [facility] Perioperative Services: Pre-Admission/Anesthesia Testing   Date: 11/20/19  Name: Elizabeth Trujillo MRN:   [id number]  Re: Consideration of perioperative therapeutic ABX change in patient with PCN allergy  Request sent to: Hollice Espy, MD and Debroah Loop, PA-C Notification mode: Routed and/or faxed via [facility]   Procedure scheduled: cystoscopy with retrograde pyelogram, ureteroscopy and possible stent placement Date of procedure: 11/24/2019  Documented PCN allergy: YES - The patient has advised that PCN has cause low to moderate severity hives in the past.  Notes: Received cephalosporin on 11/13/18 (ceftriaxone), 11/19/18 (cephalexin), and 07/11/19 (cephalexin) with no documented complications.  Currently ordered preoperative prophylactic ABX: ciprofloxacin.  Request/recommendations: As an evidence based approach to reducing the rate of incidence for post-operative SSI and the development of MDROs, could an agent with narrower coverage for preoperative prophylaxis in this patient's upcoming surgical course be considered? Specifically requesting change to a first generation cephalosporin (cefazolin). Please have your staff communicate decision with me. I would be happy to change the orders in Epic as per your directives.   Things to consider: . Many patients report that they were "allergic" to PCN earlier in life, however this does not translate into a true lifelong allergy. Patients can lose sensitivity to specific IgE antibodies over time if PCN is avoided (Kleris & Lugar, 2019).  Marland Kitchen Up to 10% of the adult population and 15% of hospitalized patients report an allergy to PCN, however clinical studies suggest that 90% of those reporting an allergy can tolerate PCN antibiotics (Kleris & Lugar, 2019).  . Cross-sensitivity between PCN and cephalosporins has been documented as being as high as 10%, however this estimation included data believed to have  been collected in a setting where there was contamination. Newer data suggests that the prevalence of cross-sensitivity between PCN and cephalosporins is actually estimated to be closer to 1% (Hermanides et al., 2018).   . Patients labeled as PCN allergic, whether they are truly allergic or not, have been found to have inferior outcomes in terms of rates of serious infection, and these patients tend to have longer hospital stays (Sandy, 2019).  . Treatment related secondary infections, such as Clostridioides difficile, have been linked to the improper use of broad spectrum antibiotics in patients improperly labeled as PCN allergic (Kleris & Lugar, 2019).  Marland Kitchen Anaphylaxis from cephalosporins is rare and the evidence suggests that there is no increased risk of an anaphylactic type reaction when cephalosporins are used in a PCN allergic patient (Pichichero, 2006).   Citations Hermanides J, Lemkes BA, Prins JM, Hollmann MW, Terreehorst I. Presumed ?-Lactam Allergy and Cross-reactivity in the Operating Theater: A Practical Approach. Anesthesiology. 2018 Aug;129(2):335-342. doi: 10.1097/ALN.0000000000002252. PMID: 68032122.  Kleris, Falcon., & Lugar, P. L. (2019). Things We Do For No Reason: Failing to Question a Penicillin Allergy History. Journal of hospital medicine, 14(10), [phone]. Advance online publication. https://www.wallace-middleton.info/  Pichichero, M. E. (2006). Cephalosporins can be prescribed safely for penicillin-allergic patients. Journal of family medicine, 55(2), 106-112. Accessed: https://cdn.mdedge.com/files/s95fs-public/Document/September-2017/5502JFP_AppliedEvidence1.pdf  Honor Loh, MSN, APRN, FNP-C, CEN [facility]  Peri-operative Services Nurse Practitioner Phone: [phone] 11/20/19 12:48 PM

## 2019-11-21 LAB — SARS CORONAVIRUS 2 (TAT 6-24 HRS): SARS Coronavirus 2: NEGATIVE

## 2019-11-24 ENCOUNTER — Ambulatory Visit
Admission: RE | Admit: 2019-11-24 | Discharge: 2019-11-24 | Disposition: A | Discharge status: Home / Self Care | Payer: Medicare Other | Attending: Urology | Admitting: Urology

## 2019-11-24 ENCOUNTER — Other Ambulatory Visit: Payer: Self-pay

## 2019-11-24 ENCOUNTER — Encounter
Admission: RE | Disposition: A | Discharge status: Home / Self Care | Payer: Self-pay | Source: Home / Self Care | Attending: Urology

## 2019-11-24 ENCOUNTER — Encounter: Payer: Self-pay | Admitting: Urology

## 2019-11-24 ENCOUNTER — Ambulatory Visit: Payer: Medicare Other

## 2019-11-24 ENCOUNTER — Ambulatory Visit: Payer: Medicare Other | Admitting: Urgent Care

## 2019-11-24 DIAGNOSIS — Z8551 Personal history of malignant neoplasm of bladder: Secondary | ICD-10-CM | POA: Insufficient documentation

## 2019-11-24 DIAGNOSIS — Z7982 Long term (current) use of aspirin: Secondary | ICD-10-CM | POA: Insufficient documentation

## 2019-11-24 DIAGNOSIS — Z8673 Personal history of transient ischemic attack (TIA), and cerebral infarction without residual deficits: Secondary | ICD-10-CM | POA: Diagnosis not present

## 2019-11-24 DIAGNOSIS — Z01810 Encounter for preprocedural cardiovascular examination: Secondary | ICD-10-CM | POA: Diagnosis not present

## 2019-11-24 DIAGNOSIS — Z8554 Personal history of malignant neoplasm of ureter: Secondary | ICD-10-CM | POA: Diagnosis not present

## 2019-11-24 DIAGNOSIS — E782 Mixed hyperlipidemia: Secondary | ICD-10-CM | POA: Diagnosis not present

## 2019-11-24 DIAGNOSIS — E7800 Pure hypercholesterolemia, unspecified: Secondary | ICD-10-CM | POA: Diagnosis not present

## 2019-11-24 DIAGNOSIS — Z853 Personal history of malignant neoplasm of breast: Secondary | ICD-10-CM | POA: Insufficient documentation

## 2019-11-24 DIAGNOSIS — C679 Malignant neoplasm of bladder, unspecified: Secondary | ICD-10-CM

## 2019-11-24 DIAGNOSIS — K219 Gastro-esophageal reflux disease without esophagitis: Secondary | ICD-10-CM | POA: Diagnosis not present

## 2019-11-24 DIAGNOSIS — Z79899 Other long term (current) drug therapy: Secondary | ICD-10-CM | POA: Diagnosis not present

## 2019-11-24 DIAGNOSIS — M1990 Unspecified osteoarthritis, unspecified site: Secondary | ICD-10-CM | POA: Insufficient documentation

## 2019-11-24 DIAGNOSIS — Z08 Encounter for follow-up examination after completed treatment for malignant neoplasm: Secondary | ICD-10-CM | POA: Insufficient documentation

## 2019-11-24 DIAGNOSIS — I10 Essential (primary) hypertension: Secondary | ICD-10-CM | POA: Diagnosis not present

## 2019-11-24 HISTORY — PX: CYSTOSCOPY WITH RETROGRADE PYELOGRAM, URETEROSCOPY AND STENT PLACEMENT: SHX5789

## 2019-11-24 SURGERY — CYSTOURETEROSCOPY, WITH RETROGRADE PYELOGRAM AND STENT INSERTION
Anesthesia: General | Laterality: Left

## 2019-11-24 MED ORDER — LACTATED RINGERS IV SOLN: INTRAVENOUS | Status: DC

## 2019-11-24 MED ORDER — GLYCOPYRROLATE 0.2 MG/ML IJ SOLN
INTRAMUSCULAR | Status: DC | PRN
  Administered 2019-11-24: .2 mg via INTRAVENOUS

## 2019-11-24 MED ORDER — FENTANYL CITRATE (PF) 100 MCG/2ML IJ SOLN
INTRAMUSCULAR | Status: AC
  Filled 2019-11-24: qty 2

## 2019-11-24 MED ORDER — PROPOFOL 10 MG/ML IV BOLUS
INTRAVENOUS | Status: AC
  Filled 2019-11-24: qty 20

## 2019-11-24 MED ORDER — TRAMADOL HCL 50 MG PO TABS
ORAL_TABLET | ORAL | Status: DC
Start: 2019-11-24 — End: 2019-11-24
  Filled 2019-11-24: qty 1

## 2019-11-24 MED ORDER — PROPOFOL 10 MG/ML IV BOLUS
INTRAVENOUS | Status: DC | PRN
  Administered 2019-11-24: 160 mg via INTRAVENOUS

## 2019-11-24 MED ORDER — CHLORHEXIDINE GLUCONATE 0.12 % MT SOLN
OROMUCOSAL | Status: AC
  Administered 2019-11-24: 15 mL via OROMUCOSAL
  Filled 2019-11-24: qty 15

## 2019-11-24 MED ORDER — CIPROFLOXACIN IN D5W 400 MG/200ML IV SOLN: 400.0000 mg | INTRAVENOUS | Status: DC

## 2019-11-24 MED ORDER — ONDANSETRON HCL 4 MG/2ML IJ SOLN
INTRAMUSCULAR | Status: AC
  Filled 2019-11-24: qty 2

## 2019-11-24 MED ORDER — SODIUM CHLORIDE FLUSH 0.9 % IV SOLN
INTRAVENOUS | Status: AC
  Filled 2019-11-24: qty 10

## 2019-11-24 MED ORDER — ORAL CARE MOUTH RINSE: 15.0000 mL | Freq: Once | OROMUCOSAL | Status: AC

## 2019-11-24 MED ORDER — CHLORHEXIDINE GLUCONATE 0.12 % MT SOLN: 15.0000 mL | Freq: Once | OROMUCOSAL | Status: AC

## 2019-11-24 MED ORDER — IOHEXOL 180 MG/ML  SOLN
INTRAMUSCULAR | Status: DC | PRN
  Administered 2019-11-24: 10 mL

## 2019-11-24 MED ORDER — CEFAZOLIN SODIUM-DEXTROSE 2-4 GM/100ML-% IV SOLN
INTRAVENOUS | Status: AC
  Filled 2019-11-24: qty 100

## 2019-11-24 MED ORDER — LIDOCAINE HCL (CARDIAC) PF 100 MG/5ML IV SOSY
PREFILLED_SYRINGE | INTRAVENOUS | Status: DC | PRN
  Administered 2019-11-24: 100 mg via INTRAVENOUS

## 2019-11-24 MED ORDER — DEXAMETHASONE SODIUM PHOSPHATE 10 MG/ML IJ SOLN
INTRAMUSCULAR | Status: DC | PRN
  Administered 2019-11-24: 10 mg via INTRAVENOUS

## 2019-11-24 MED ORDER — CEFAZOLIN SODIUM-DEXTROSE 2-4 GM/100ML-% IV SOLN
2.0000 g | Freq: Once | INTRAVENOUS | Status: AC
  Administered 2019-11-24: 2 g via INTRAVENOUS

## 2019-11-24 MED ORDER — FAMOTIDINE 20 MG PO TABS
ORAL_TABLET | ORAL | Status: AC
  Administered 2019-11-24: 20 mg via ORAL
  Filled 2019-11-24: qty 1

## 2019-11-24 MED ORDER — ONDANSETRON HCL 4 MG/2ML IJ SOLN
INTRAMUSCULAR | Status: DC | PRN
  Administered 2019-11-24: 4 mg via INTRAVENOUS

## 2019-11-24 MED ORDER — FAMOTIDINE 20 MG PO TABS: 20.0000 mg | ORAL_TABLET | Freq: Once | ORAL | Status: AC

## 2019-11-24 MED ORDER — FENTANYL CITRATE (PF) 100 MCG/2ML IJ SOLN
INTRAMUSCULAR | Status: DC | PRN
Start: 2019-11-24 — End: 2019-11-24
  Administered 2019-11-24 (×2): 25 ug via INTRAVENOUS

## 2019-11-24 MED ORDER — TRAMADOL HCL 50 MG PO TABS
50.0000 mg | ORAL_TABLET | Freq: Once | ORAL | Status: AC
  Administered 2019-11-24: 50 mg via ORAL

## 2019-11-24 MED ORDER — ONDANSETRON HCL 4 MG/2ML IJ SOLN
4.0000 mg | Freq: Once | INTRAMUSCULAR | Status: AC | PRN
  Administered 2019-11-24: 4 mg via INTRAVENOUS

## 2019-11-24 MED ORDER — FENTANYL CITRATE (PF) 100 MCG/2ML IJ SOLN: 25.0000 ug | INTRAMUSCULAR | Status: DC | PRN

## 2019-11-24 SURGICAL SUPPLY — 33 items
BAG DRAIN CYSTO-URO LG1000N (MISCELLANEOUS) ×3 IMPLANT
BRUSH SCRUB EZ  4% CHG (MISCELLANEOUS) ×3
BRUSH SCRUB EZ 1% IODOPHOR (MISCELLANEOUS) ×3 IMPLANT
BRUSH SCRUB EZ 4% CHG (MISCELLANEOUS) ×1 IMPLANT
CATH URETL 5X70 OPEN END (CATHETERS) ×3 IMPLANT
CNTNR SPEC 2.5X3XGRAD LEK (MISCELLANEOUS) ×1
CONRAY 43 FOR UROLOGY 50M (MISCELLANEOUS) ×3 IMPLANT
CONT SPEC 4OZ STER OR WHT (MISCELLANEOUS) ×2
CONT SPEC 4OZ STRL OR WHT (MISCELLANEOUS) ×1
CONTAINER SPEC 2.5X3XGRAD LEK (MISCELLANEOUS) ×1 IMPLANT
COVER WAND RF STERILE (DRAPES) ×3 IMPLANT
DRSG TELFA 4X3 1S NADH ST (GAUZE/BANDAGES/DRESSINGS) ×3 IMPLANT
FORCEPS BIOP PIRANHA Y (CUTTING FORCEPS) IMPLANT
GLOVE BIO SURGEON STRL SZ 6.5 (GLOVE) ×2 IMPLANT
GLOVE BIO SURGEONS STRL SZ 6.5 (GLOVE) ×1
GOWN STRL REUS W/ TWL LRG LVL3 (GOWN DISPOSABLE) ×2 IMPLANT
GOWN STRL REUS W/TWL LRG LVL3 (GOWN DISPOSABLE) ×6
GUIDEWIRE GREEN .038 145CM (MISCELLANEOUS) ×3 IMPLANT
GUIDEWIRE STR DUAL SENSOR (WIRE) ×6 IMPLANT
INFUSOR MANOMETER BAG 3000ML (MISCELLANEOUS) ×3 IMPLANT
INTRODUCER DILATOR DOUBLE (INTRODUCER) ×3 IMPLANT
KIT TURNOVER CYSTO (KITS) ×3 IMPLANT
NDL SAFETY ECLIPSE 18X1.5 (NEEDLE) ×1 IMPLANT
NEEDLE HYPO 18GX1.5 SHARP (NEEDLE) ×3
PACK CYSTO AR (MISCELLANEOUS) ×3 IMPLANT
SET CYSTO W/LG BORE CLAMP LF (SET/KITS/TRAYS/PACK) ×3 IMPLANT
SHEATH URETERAL 12FRX35CM (MISCELLANEOUS) ×3 IMPLANT
SOL .9 NS 3000ML IRR  AL (IV SOLUTION) ×3
SOL .9 NS 3000ML IRR AL (IV SOLUTION) ×1
SOL .9 NS 3000ML IRR UROMATIC (IV SOLUTION) ×1 IMPLANT
SURGILUBE 2OZ TUBE FLIPTOP (MISCELLANEOUS) ×3 IMPLANT
WATER STERILE IRR 1000ML POUR (IV SOLUTION) ×3 IMPLANT
WATER STERILE IRR 3000ML UROMA (IV SOLUTION) ×3 IMPLANT

## 2019-11-24 NOTE — Anesthesia Preprocedure Evaluation (Signed)
Anesthesia Evaluation  Patient identified by MRN, date of birth, ID band Patient awake    Reviewed: Allergy & Precautions, NPO status , Patient's Chart, lab work & pertinent test results  History of Anesthesia Complications Negative for: history of anesthetic complications  Airway Mallampati: II  TM Distance: >3 FB Neck ROM: Full    Dental  (+) Poor Dentition   Pulmonary neg pulmonary ROS, neg sleep apnea, neg COPD,    breath sounds clear to auscultation- rhonchi (-) wheezing      Cardiovascular hypertension, (-) CAD, (-) Past MI, (-) Cardiac Stents and (-) CABG  Rhythm:Regular Rate:Normal - Systolic murmurs and - Diastolic murmurs    Neuro/Psych Seizures -, Well Controlled,  PSYCHIATRIC DISORDERS TIA   GI/Hepatic Neg liver ROS, GERD  ,  Endo/Other  negative endocrine ROSneg diabetes  Renal/GU negative Renal ROS     Musculoskeletal  (+) Arthritis ,   Abdominal (+) + obese,   Peds  Hematology negative hematology ROS (+)   Anesthesia Other Findings Past Medical History: No date: Arthritis 1986: Breast cancer ([place])     Comment:  left mastectomy 1986: Cancer ([place])     Comment:  left breast 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     Comment:  BCG tx per pt No date: Collagen vascular disease (HCC)     Comment:  RA  No date: Elevated triglycerides with high cholesterol No date: Family history of bladder cancer No date: Family history of breast cancer No date: Family history of lung cancer No date: GERD (gastroesophageal reflux disease)     Comment:  history of reflux No date: History of cancer of ureter No date: Hypercholesterolemia No date: Hypertension     Comment:  history of  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, left (HCC)     Comment:  BCG  tx per pt No date: Seizure ([place])     Comment:  last 08-2018 No date: Stroke [facility])     Comment:  TIA   Reproductive/Obstetrics                              Anesthesia Physical Anesthesia Plan  ASA: III  Anesthesia Plan: General   Post-op Pain Management:    Induction: Intravenous  PONV Risk Score and Plan: 2 and Ondansetron and Treatment may vary due to age or medical condition  Airway Management Planned: LMA  Additional Equipment:   Intra-op Plan:   Post-operative Plan:   Informed Consent: I have reviewed the patients History and Physical, chart, labs and discussed the procedure including the risks, benefits and alternatives for the proposed anesthesia with the patient or authorized representative who has indicated his/her understanding and acceptance.     Dental advisory given  Plan Discussed with: CRNA and Anesthesiologist  Anesthesia Plan Comments:         Anesthesia Quick Evaluation

## 2019-11-24 NOTE — Op Note (Signed)
Date of procedure: 11/24/19  Preoperative diagnosis:  1. History of bladder cancer (CIS) 2. History of upper tract urothelial carcinoma, high-grade of the left distal ureter  Postoperative diagnosis:  1. Same as above  Procedure: 1. Cystoscopy 2. Bilateral retrograde pyelogram 3. Left diagnostic ureteroscopy  Surgeon: Hollice Espy, MD  Anesthesia: General  Complications: None  Intraoperative findings: Bladder unremarkable.  Right retrograde normal without hydroureteronephrosis or filling defects.  Slight narrowing involving several centimeters of the left distal ureter but drained promptly without any obvious filling defects or hydronephrosis.  Ureteroscopy completely unremarkable, able to advance ureteroscope all the way up to the proximal ureter without any concerning mucosal findings.  Ureter widely patent.  EBL: Minimal  Specimens: None  Drains: None  Indication: SIMON AABERG is a 84 y.o. 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  She has had various surveillance modalities including office cystoscopy, CT urogram as well as post BCG biopsies to prove efficacy of treatment.  Given her history of high risk malignancy, I recommended direct visualization today.  Notably, she did have a recent negative cytology which is reassuring.  After reviewing the management options for treatment, she elected to proceed with the above surgical procedure(s). We have discussed the potential benefits and risks of the procedure, side effects of the proposed treatment, the likelihood of the patient achieving the goals of the procedure, and any potential problems that might occur during the procedure or recuperation. Informed consent has been obtained.  Description of procedure:  The patient was taken to the operating room and  general anesthesia was induced.  The patient was placed in the dorsal lithotomy position, prepped and draped in the usual sterile fashion, and preoperative antibiotics were administered. A preoperative time-out was performed.   A 21 French cystoscope was advanced per urethra into the bladder.  The bladder was carefully surveilled and absolutely free of any tumors, masses, or lesions.  There is no erythematous patches or any other concerning findings for bladder cancer or CIS.  Attention was first turned to the right ureteral orifice.  This is cannulated using a 5 [place] open-ended ureteral catheter just within the UO.  Gentle retrograde pyelogram on this side revealed no hydroureteronephrosis or filling defects.  Images were saved to PACS.  Attention was then turned to the left ureteral orifice.  This was cannulated using the same 5 [place] open-ended ureteral catheter.  A gentle retrograde pyelogram was performed on this side.  There were no hydroureteronephrosis or filling defects although there was some very subtle narrowing, several centimeters involving the left distal ureter presumably related to scar,.  This was similar in appearance to her CT urogram 3 months ago.  There were no filling defects in this area which is reassuring.  A sensor wire is in place up to the level of the kidney.  This was snapped in place.  A 5 French semirigid ureteroscope was then advanced quite easily through the UO up to the level of the proximal ureter.  The ureteral mucosa itself was normal in appearance without any erythema, papillary change, edema, or any other concerning findings.  Ureteroscopy is completely normal.  The ureter was completely patent.  The scope was backed out like the ureter inspecting along the way.  There is no areas deemed necessary for biopsy based on its visually normal appearance  and previous negative cytology.  The scope was removed.  Contrast drained promptly and given the very atraumatic  ureteroscopy today, elected not to place a stent as it did not seem necessary.  The bladder was drained.  The patient was cleaned and dried, repositioned in supine position, reversed from anesthesia, and taken to the PACU in stable condition.  I called her husband to discuss intraoperative findings and negative cystoscopy/bladder biopsy today.  We will plan on having her follow-up in the office in 3 months for routine surveillance cystoscopy, cytology and consideration of additional upper tract imaging for surveillance.  Hollice Espy, M.D.

## 2019-11-24 NOTE — Anesthesia Procedure Notes (Signed)
Procedure Name: LMA Insertion Performed by: Kelton Pillar, CRNA Pre-anesthesia Checklist: Patient identified, Emergency Drugs available, Suction available and Patient being monitored Patient Re-evaluated:Patient Re-evaluated prior to induction Oxygen Delivery Method: Circle system utilized Preoxygenation: Pre-oxygenation with 100% oxygen Induction Type: IV induction Tube type: Oral Number of attempts: 1 Airway Equipment and Method: Oral airway Placement Confirmation: positive ETCO2,  breath sounds checked- equal and bilateral and CO2 detector Tube secured with: Tape Dental Injury: Teeth and Oropharynx as per pre-operative assessment

## 2019-11-24 NOTE — Anesthesia Postprocedure Evaluation (Signed)
Anesthesia Post Note  Patient: NEVE BRANSCOMB  Procedure(s) Performed: CYSTOSCOPY WITH RETROGRADE PYELOGRAM, URETEROSCOPY (Left )  Patient location during evaluation: PACU Anesthesia Type: General Level of consciousness: awake and alert and oriented Pain management: pain level controlled Vital Signs Assessment: post-procedure vital signs reviewed and stable Respiratory status: spontaneous breathing, nonlabored ventilation and respiratory function stable Cardiovascular status: blood pressure returned to baseline and stable Postop Assessment: no signs of nausea or vomiting Anesthetic complications: no   No complications documented.   Last Vitals:  Vitals:   11/24/19 1438 11/24/19 1501  BP:  (!) 148/80  Pulse: 78 71  Resp: (!) 7 13  Temp:  (!) 36.4 C  SpO2: 96% 97%    Last Pain:  Vitals:   11/24/19 1501  TempSrc:   PainSc: 0-No pain                 Isbella Arline

## 2019-11-24 NOTE — Transfer of Care (Signed)
Immediate Anesthesia Transfer of Care Note  [name]  Procedure(s) Performed: CYSTOSCOPY WITH RETROGRADE PYELOGRAM, URETEROSCOPY (Left )  Patient Location: PACU  Anesthesia Type:General  Level of Consciousness: awake and drowsy  Airway & Oxygen Therapy: Patient Spontanous Breathing and Patient connected to face mask oxygen  Post-op Assessment: Report given to RN and Post -op Vital signs reviewed and stable  Post vital signs: Reviewed and stable  Last Vitals:  Vitals Value Taken Time  BP 148/87 11/24/19 1431  Temp 36.2 C 11/24/19 1431  Pulse 77 11/24/19 1440  Resp 11 11/24/19 1440  SpO2 96 % 11/24/19 1440  Vitals shown include unvalidated device data.  Last Pain:  Vitals:   11/24/19 1431  TempSrc:   PainSc: 0-No pain         Complications: No complications documented.

## 2019-11-24 NOTE — Interval H&P Note (Signed)
History and Physical Interval Note:  11/24/2019 1:31 PM  Elizabeth Trujillo  has presented today for surgery, with the diagnosis of bladder cancer.  The various methods of treatment have been discussed with the patient and family. After consideration of risks, benefits and other options for treatment, the patient has consented to  Procedure(s): CYSTOSCOPY WITH RETROGRADE PYELOGRAM, URETEROSCOPY AND possible STENT PLACEMENT (Left) URETERAL BIOPSY (Left) as a surgical intervention.  The patient's history has been reviewed, patient examined, no change in status, stable for surgery.  I have reviewed the patient's chart and labs.  Questions were answered to the patient's satisfaction.    RRR CTAB  Hollice Espy

## 2019-11-24 NOTE — Discharge Instructions (Signed)
Ureteroscopy Ureteroscopy is a procedure to check for and treat problems inside part of the urinary tract. In this procedure, a thin, tube-shaped instrument with a light at the end (ureteroscope) is used to look at the inside of the kidneys and the ureters, which are the tubes that carry urine from the kidneys to the bladder. The ureteroscope is inserted into one or both of the ureters. You may need this procedure if you have frequent urinary tract infections (UTIs), blood in your urine, or a stone in one of your ureters. A ureteroscopy can be done to find the cause of urine blockage in a ureter and to evaluate other abnormalities inside the ureters or kidneys. If stones are found, they can be removed during the procedure. Polyps, abnormal tissue, and some types of tumors can also be removed or treated. The ureteroscope may also have a tool to remove tissue to be checked for disease under a microscope (biopsy). Tell a health care provider about:  Any allergies you have.  All medicines you are taking, including vitamins, herbs, eye drops, creams, and over-the-counter medicines.  Any problems you or family members have had with anesthetic medicines.  Any blood disorders you have.  Any surgeries you have had.  Any medical conditions you have.  Whether you are pregnant or may be pregnant. What are the risks? Generally, this is a safe procedure. However, problems may occur, including:  Bleeding.  Infection.  Allergic reactions to medicines.  Scarring that narrows the ureter (stricture).  Creating a hole in the ureter (perforation). What happens before the procedure? Staying hydrated Follow instructions from your health care provider about hydration, which may include:  Up to 2 hours before the procedure - you may continue to drink clear liquids, such as water, clear fruit juice, black coffee, and plain tea. Eating and drinking restrictions Follow instructions from your health care  provider about eating and drinking, which may include:  8 hours before the procedure - stop eating heavy meals or foods such as meat, fried foods, or fatty foods.  6 hours before the procedure - stop eating light meals or foods, such as toast or cereal.  6 hours before the procedure - stop drinking milk or drinks that contain milk.  2 hours before the procedure - stop drinking clear liquids. Medicines  Ask your health care provider about: ? Changing or stopping your regular medicines. This is especially important if you are taking diabetes medicines or blood thinners. ? Taking medicines such as aspirin and ibuprofen. These medicines can thin your blood. Do not take these medicines before your procedure if your health care provider instructs you not to.  You may be given antibiotic medicine to help prevent infection. General instructions  You may have a urine sample taken to check for infection.  Plan to have someone take you home from the hospital or clinic. What happens during the procedure?   To reduce your risk of infection: ? Your health care team will wash or sanitize their hands. ? Your skin will be washed with soap.  An IV tube will be inserted into one of your veins.  You will be given one of the following: ? A medicine to help you relax (sedative). ? A medicine to make you fall asleep (general anesthetic). ? A medicine that is injected into your spine to numb the area below and slightly above the injection site (spinal anesthetic).  To lower your risk of infection, you may be given an antibiotic medicine   by an injection or through the IV tube.  The opening from which you urinate (urethra) will be cleaned with a germ-killing solution.  The ureteroscope will be passed through your urethra into your bladder.  A salt-water solution will flow through the ureteroscope to fill your bladder. This will help the health care provider see the openings of your ureters more  clearly.  Then, the ureteroscope will be passed into your ureter. ? If a growth is found, a piece of it may be removed so it can be examined under a microscope (biopsy). ? If a stone is found, it may be removed through the ureteroscope, or the stone may be broken up using a laser, shock waves, or electrical energy. ? In some cases, if the ureter is too small, a tube may be inserted that keeps the ureter open (ureteral stent). The stent may be left in place for 1 or 2 weeks to keep the ureter open, and then the ureteroscopy procedure will be performed.  The scope will be removed, and your bladder will be emptied. The procedure may vary among health care providers and hospitals. What happens after the procedure?  Your blood pressure, heart rate, breathing rate, and blood oxygen level will be monitored until the medicines you were given have worn off.  You may be asked to urinate.  Donot drive for 24 hours if you were given a sedative. This information is not intended to replace advice given to you by your health care provider. Make sure you discuss any questions you have with your health care provider. Document Revised: 02/09/2017 Document Reviewed: 12/10/2015 Elsevier Patient Education  2020 [place]   1) The drugs that you were given will stay in your system until tomorrow so for the next 24 hours you should not:  A) Drive an automobile B) Make any legal decisions C) Drink any alcoholic beverage   2) You may resume regular meals tomorrow.  Today it is better to start with liquids and gradually work up to solid foods.  You may eat anything you prefer, but it is better to start with liquids, then soup and crackers, and gradually work up to solid foods.   3) Please notify your doctor immediately if you have any unusual bleeding, trouble breathing, redness and pain at the surgery site, drainage, fever, or pain not relieved by  medication.    4) Additional Instructions:        Please contact your physician with any problems or Same Day Surgery at [phone], Monday through Friday 6 am to 4 pm, or [place] at [facility] number at [phone].

## 2019-11-24 NOTE — Progress Notes (Addendum)
  [facility] Perioperative Services: Pre-Admission/Anesthesia Testing     Date: 11/24/19  Name: Elizabeth Trujillo MRN:   [id number]  Re: Change in [place] for upcoming surgery  Patient scheduled for upcoming surgery later this morning. Primary attending surgeon was consulted regarding consideration of therapeutic change in antimicrobial agent being used for preoperative prophylaxis in this patient's case. Following analysis of the risk versus benefits, Dr. Erlene Quan advising that it would be acceptable to discontinue the ordered ciprofloxacin and place an order for cefazolin 2 gm IV on call to the OR. Orders for this patient were amended by me following collaborative conversation with attending surgeon.  Honor Loh, MSN, APRN, FNP-C, CEN [facility]  Peri-operative Services Nurse Practitioner Phone: [phone] 11/24/19 10:34 AM

## 2019-11-25 ENCOUNTER — Encounter: Payer: Self-pay | Admitting: Urology

## 2019-11-25 ENCOUNTER — Telehealth: Payer: Self-pay

## 2019-11-25 NOTE — Telephone Encounter (Signed)
Incoming call from pt via after hours nurse line, requesting pain medication post ureterscopy. On call provider was paged x 3 with no response per [facility]. Called pt she states that she was in severe pain last night after procedure pain was lower abdominal as well as pelvic. She took Tramadol which offered some relief several hours later. Pt states she wants it noted in her chart that she needs stronger pain meds post op as she goes through this every time she has this procedure. Today pt denies fever, chills, pain. Advised pt to call back if symptoms change. Pt voiced understanding.

## 2019-12-23 DIAGNOSIS — D485 Neoplasm of uncertain behavior of skin: Secondary | ICD-10-CM | POA: Diagnosis not present

## 2019-12-23 DIAGNOSIS — L821 Other seborrheic keratosis: Secondary | ICD-10-CM | POA: Diagnosis not present

## 2019-12-23 DIAGNOSIS — K644 Residual hemorrhoidal skin tags: Secondary | ICD-10-CM | POA: Diagnosis not present

## 2019-12-23 DIAGNOSIS — L218 Other seborrheic dermatitis: Secondary | ICD-10-CM | POA: Diagnosis not present

## 2019-12-26 DIAGNOSIS — Z23 Encounter for immunization: Secondary | ICD-10-CM | POA: Diagnosis not present

## 2020-01-08 ENCOUNTER — Ambulatory Visit (INDEPENDENT_AMBULATORY_CARE_PROVIDER_SITE_OTHER): Payer: Medicare Other

## 2020-01-08 ENCOUNTER — Ambulatory Visit
Admission: EM | Admit: 2020-01-08 | Discharge: 2020-01-08 | Disposition: A | Discharge status: Home / Self Care | Payer: Medicare Other | Attending: Family Medicine | Admitting: Family Medicine

## 2020-01-08 ENCOUNTER — Other Ambulatory Visit: Payer: Self-pay

## 2020-01-08 DIAGNOSIS — M25511 Pain in right shoulder: Secondary | ICD-10-CM

## 2020-01-08 DIAGNOSIS — G8929 Other chronic pain: Secondary | ICD-10-CM | POA: Diagnosis not present

## 2020-01-08 MED ORDER — BACLOFEN 10 MG PO TABS: 5.0000 mg | ORAL_TABLET | Freq: Three times a day (TID) | ORAL | 0 refills | Status: DC | PRN

## 2020-01-08 MED ORDER — HYDROCODONE-ACETAMINOPHEN 5-325 MG PO TABS
1.0000 | ORAL_TABLET | Freq: Three times a day (TID) | ORAL | 0 refills | Status: DC | PRN
Start: 2020-01-08 — End: 2020-02-20

## 2020-01-08 NOTE — ED Triage Notes (Signed)
Patient complains of right shoulder pain x 4-6 weeks. States that she has been trying ibuprofen and tylenol and heat and ice without relief. Denies any known injury. States that prior to this she did have a covid booster vaccine in this arm. Pain radiates to her shoulder blade.

## 2020-01-08 NOTE — ED Provider Notes (Signed)
[facility]    CSN: [id number] Arrival date & time: 01/08/20  1738  History   Chief Complaint Chief Complaint  Patient presents with  . Shoulder Pain    right   HPI   84 year old female presents with the above complaint.  Patient states that she has had right shoulder pain for the past 4 to 6 weeks.  Has progressively been getting worse.  Patient also reports pain around the scapula.  Denies any fall, trauma, injury.  Has tried over-the-counter Tylenol, ibuprofen, heat, and ice without resolution.  She has not seen or discussed this with the provider until today.  Seems to be worse with certain movements and activity.  Pain currently 5/10 in severity.  Past Medical History:  Diagnosis Date  . Arthritis   . Breast cancer ([place]) 1986   left mastectomy  . Cancer ([place]) 1986   left breast  . carcinoma in situ of urinary bladder 2019   BCG tx per pt  . Collagen vascular disease (HCC)    RA   . Elevated triglycerides with high cholesterol   . Family history of bladder cancer   . Family history of breast cancer   . Family history of lung cancer   . GERD (gastroesophageal reflux disease)    history of reflux  . History of cancer of ureter   . Hypercholesterolemia   . Hypertension    history of   . Primary ureteral papillary carcinoma, left ([place]) 2019   BCG  tx per pt  . Seizure ([place])    last 08-2018  . Stroke [facility])    TIA    Patient Active Problem List   Diagnosis Date Noted  . Genetic testing 06/17/2019  . Family history of breast cancer   . Family history of bladder cancer   . Family history of lung cancer   . History of cancer of ureter   . TIA (transient ischemic attack) 11/01/2018  . Aphasia 09/08/2018  . HTN (hypertension) 09/08/2018  . GERD (gastroesophageal reflux disease) 09/08/2018  . Fatigue 01/06/2016  . Urinary tract infection, site not specified 11/21/2015  . S/P total knee arthroplasty 11/03/2015  . Knee pain 09/28/2015  . Myalgia  09/28/2015  . Allergic rhinitis 01/06/2015  . Confusion state 01/06/2015  . Dermatitis, eczematoid 01/06/2015  . Episode of hypertension 01/06/2015  . Hypercholesterolemia 01/06/2015  . Angiopathy 01/06/2015  . Malignant neoplasm of breast ([place]) 01/06/2015  . Adiposity 01/06/2015  . Osteoarthritis 01/06/2015  . Osteopenia 01/06/2015  . Avitaminosis D 01/06/2015    Past Surgical History:  Procedure Laterality Date  . ABDOMINAL HYSTERECTOMY  [id number]   TAH/BSO  . BIOPSY Left 11/11/2018   Procedure: BIOPSY;  Surgeon: Hollice Espy, MD;  Location: [facility];  Service: Urology;  Laterality: Left;  . BREAST BIOPSY Right 01/23/2013   [place] bx/clip-neg  . BREAST SURGERY Left 07/1984   Mastectomy  . callus removed from left toe    . CATARACT EXTRACTION W/ INTRAOCULAR LENS IMPLANT Left 2015  . CYSTOSCOPY W/ RETROGRADES Left 02/24/2019   Procedure: CYSTOSCOPY WITH RETROGRADE PYELOGRAM;  Surgeon: Hollice Espy, MD;  Location: [facility];  Service: Urology;  Laterality: Left;  . CYSTOSCOPY WITH BIOPSY N/A 02/24/2019   Procedure: CYSTOSCOPY WITH Bladder BIOPSY;  Surgeon: Hollice Espy, MD;  Location: [facility];  Service: Urology;  Laterality: N/A;  . CYSTOSCOPY WITH FULGERATION N/A 02/24/2019   Procedure: CYSTOSCOPY WITH FULGERATION;  Surgeon: Hollice Espy, MD;  Location: [facility];  Service: Urology;  Laterality:  N/A;  . CYSTOSCOPY WITH RETROGRADE PYELOGRAM, URETEROSCOPY AND STENT PLACEMENT Left 11/24/2019   Procedure: CYSTOSCOPY WITH RETROGRADE PYELOGRAM, URETEROSCOPY;  Surgeon: Hollice Espy, MD;  Location: [facility];  Service: Urology;  Laterality: Left;  . CYSTOSCOPY WITH URETEROSCOPY AND STENT PLACEMENT Left 10/14/2018   Procedure: CYSTOSCOPY WITH URETEROSCOPY AND STENT PLACEMENT;  Surgeon: Hollice Espy, MD;  Location: [facility];  Service: Urology;  Laterality: Left;  . CYSTOSCOPY WITH URETEROSCOPY AND STENT PLACEMENT Left 02/24/2019   Procedure: CYSTOSCOPY WITH URETEROSCOPY AND STENT  Exchange;  Surgeon: Hollice Espy, MD;  Location: [facility];  Service: Urology;  Laterality: Left;  . CYSTOSCOPY/URETEROSCOPY/HOLMIUM LASER/STENT PLACEMENT Left 11/11/2018   Procedure: CYSTOSCOPY/URETEROSCOPY//STENT EXCHANGE;  Surgeon: Hollice Espy, MD;  Location: [facility];  Service: Urology;  Laterality: Left;  . EYE SURGERY Left 2013  . FOOT SURGERY Left 04/2013  . HOLMIUM LASER APPLICATION Left 11/14/7167   Procedure: HOLMIUM LASER APPLICATION;  Surgeon: Hollice Espy, MD;  Location: [facility];  Service: Urology;  Laterality: Left;  . JOINT REPLACEMENT    . KNEE ARTHROPLASTY Right 11/03/2015   Procedure: COMPUTER ASSISTED TOTAL KNEE ARTHROPLASTY;  Surgeon: Dereck Leep, MD;  Location: [facility];  Service: Orthopedics;  Laterality: Right;  . MASTECTOMY Left 1986   positive  . URETERAL BIOPSY Left 10/14/2018   Procedure: URETERAL BIOPSY;  Surgeon: Hollice Espy, MD;  Location: [facility];  Service: Urology;  Laterality: Left;  . URETERAL BIOPSY Left 02/24/2019   Procedure: URETERAL BIOPSY;  Surgeon: Hollice Espy, MD;  Location: [facility];  Service: Urology;  Laterality: Left;    OB History    Gravida  4   Para  3   Term  3   Preterm      AB  1   Living  3     SAB  1   TAB      Ectopic      Multiple      Live Births  3            Home Medications    Prior to Admission medications   Medication Sig Start Date End Date Taking? Authorizing Provider  acetaminophen (TYLENOL) 500 MG tablet Take 1,000 mg by mouth every 6 (six) hours as needed for moderate pain (pain.).    Yes [provider]  aspirin EC 81 MG tablet Take 81 mg by mouth at bedtime.   Yes [provider]  atorvastatin (LIPITOR) 40 MG tablet Take 1 tablet (40 mg total) by mouth daily. 09/12/19 09/11/20 Yes [name]., MD  Calcium Carb-Cholecalciferol (CALCIUM+D3 PO) Take 1 tablet by mouth daily.    Yes [provider]  cholecalciferol (VITAMIN D) 25 MCG (1000 UT) tablet  Take 1,000 Units by mouth at bedtime.   Yes [provider]  hydrocortisone (ANUSOL-HC) 25 MG suppository Place 1 suppository (25 mg total) rectally 2 (two) times daily. 09/25/19  Yes [name]., MD  hydrocortisone 2.5 % cream Apply 1 application topically daily as needed for rash. 12/24/18  Yes [provider]  ketoconazole (NIZORAL) 2 % shampoo Apply 1 application topically as needed (flaky/itchy scalp). Up to once per week 04/18/18  Yes [provider]  levETIRAcetam (KEPPRA) 250 MG tablet Take 250 mg by mouth 2 (two) times daily.  09/25/18  Yes [provider]  Multiple Vitamin (MULTIVITAMIN WITH MINERALS) TABS tablet Take 1 tablet by mouth daily at 2 PM.   Yes [provider]  nystatin cream (MYCOSTATIN) Apply 1 application  topically 2 (two) times daily. Patient taking differently: Apply 1 application topically 2 (two) times daily as needed (yeast).  12/06/17  Yes [name]., MD  Phenazopyridine HCl (AZO DINE PO) Take by mouth.   Yes [provider]  Probiotic Product (CULTURELLE PROBIOTICS PO) Take 1 capsule by mouth daily.   Yes [provider]  baclofen (LIORESAL) 10 MG tablet Take 0.5-1 tablets (5-10 mg total) by mouth 3 (three) times daily as needed for muscle spasms. 01/08/20   Coral Spikes, DO  HYDROcodone-acetaminophen (NORCO/VICODIN) 5-325 MG tablet Take 1 tablet by mouth every 8 (eight) hours as needed. 01/08/20   Coral Spikes, DO    Family History Family History  Problem Relation Age of Onset  . Glaucoma Mother   . Kidney disease Mother   . Arthritis Mother   . Stroke Mother   . Breast cancer Mother 32  . Bladder Cancer Mother 61  . Heart disease Father   . Cancer Brother 35       lung cancer, smoker  . Breast cancer Cousin        3 mat cousins dx late 44s  . Breast cancer Cousin        1 mat cousin dx 36s-60s    Social History Social History   Tobacco Use  . Smoking status: Never  Smoker  . Smokeless tobacco: Never Used  Vaping Use  . Vaping Use: Never used  Substance Use Topics  . Alcohol use: No  . Drug use: No     Allergies   Penicillin v potassium and Sulfa antibiotics   Review of Systems Review of Systems  Musculoskeletal:       Right shoulder pain.   Physical Exam Triage Vital Signs ED Triage Vitals  Enc Vitals Group     BP 01/08/20 1804 (!) 152/82     Pulse Rate 01/08/20 1804 74     Resp 01/08/20 1804 18     Temp 01/08/20 1804 98.1 F (36.7 C)     Temp Source 01/08/20 1804 Oral     SpO2 01/08/20 1804 98 %     Weight 01/08/20 1759 210 lb (95.3 kg)     Height 01/08/20 1759 5\' 2"  (1.575 m)     Head Circumference --      Peak Flow --      Pain Score 01/08/20 1923 5     Pain Loc --      Pain Edu? --      Excl. in [place]? --    No data found.  Updated Vital Signs BP (!) 152/82 (BP Location: Left Arm)   Pulse 74   Temp 98.1 F (36.7 C) (Oral)   Resp 18   Ht 5\' 2"  (1.575 m)   Wt 95.3 kg   SpO2 98%   BMI 38.41 kg/m   Visual Acuity Right Eye Distance:   Left Eye Distance:   Bilateral Distance:    Right Eye Near:   Left Eye Near:    Bilateral Near:     Physical Exam Constitutional:      General: She is not in acute distress.    Appearance: Normal appearance. She is obese. She is not ill-appearing.  HENT:     Head: Normocephalic and atraumatic.  Cardiovascular:     Rate and Rhythm: Normal rate and regular rhythm.  Pulmonary:     Effort: Pulmonary effort is normal. No respiratory distress.  Musculoskeletal:     Comments: Right shoulder -  patient with tenderness around the medial aspect of the right scapula.  Positive Hawkins.  Muscle strength of the rotator cuff: 4/5 infraspinatus/teres minor.  Neurological:     Mental Status: She is alert.  Psychiatric:        Mood and Affect: Mood normal.        Behavior: Behavior normal.    UC Treatments / Results  Labs (all labs ordered are listed, but only abnormal results are  displayed) Labs Reviewed - No data to display  EKG   Radiology DG Shoulder Right  Result Date: 01/08/2020 CLINICAL DATA:  84 year old female with right shoulder pain. EXAM: RIGHT SHOULDER - 2+ VIEW COMPARISON:  Right shoulder radiograph dated 09/09/2018. FINDINGS: There is no acute fracture or dislocation. The bones are osteopenic. There is mild degenerative changes of the right shoulder. There is elevation of the humeral head with narrowing of the acromial humeral distance, likely related to chronic rotator cuff injury. The soft tissues are unremarkable. IMPRESSION: No acute fracture or dislocation. Electronically Signed   By: Anner Crete M.D.   On: 01/08/2020 18:54    Procedures Procedures (including critical care time)  Medications Ordered in UC Medications - No data to display  Initial Impression / Assessment and Plan / UC Course  I have reviewed the triage vital signs and the nursing notes.  Pertinent labs & imaging results that were available during my care of the patient were reviewed by me and considered in my medical decision making (see chart for details).    84 year old female presents with chronic right shoulder pain.  This has been going on for 4 to 6 weeks.  X-ray was obtained and revealed some degenerative changes as well as elevation of the humeral head consistent with rotator cuff pathology.  Advised the patient that she should see orthopedics as she will likely need MRI imaging.  Treating pain with hydrocodone.  Patient also experiencing some muscle spasm around the scapula.  Baclofen as directed.  Advised heat.  Final Clinical Impressions(s) / UC Diagnoses   Final diagnoses:  Chronic right shoulder pain     Discharge Instructions     Medication as prescribed.  Heat.  Please call [facility] [phone]) OR [facility] [phone]) for an appt.  Take care  Dr. Lacinda Axon    ED Prescriptions    Medication Sig Dispense Auth. Provider    HYDROcodone-acetaminophen (NORCO/VICODIN) 5-325 MG tablet Take 1 tablet by mouth every 8 (eight) hours as needed. 10 tablet Jeni Duling G, DO   baclofen (LIORESAL) 10 MG tablet Take 0.5-1 tablets (5-10 mg total) by mouth 3 (three) times daily as needed for muscle spasms. 68 each Coral Spikes, DO     I have reviewed the PDMP during this encounter.   Coral Spikes, DO 01/08/20 2025

## 2020-01-08 NOTE — Discharge Instructions (Signed)
Medication as prescribed.  Heat.  Please call [facility] [phone]) OR [facility] [phone]) for an appt.  Take care  Dr. Lacinda Axon

## 2020-02-19 ENCOUNTER — Other Ambulatory Visit: Payer: Self-pay | Admitting: *Deleted

## 2020-02-19 DIAGNOSIS — C679 Malignant neoplasm of bladder, unspecified: Secondary | ICD-10-CM

## 2020-02-20 ENCOUNTER — Other Ambulatory Visit
Admission: RE | Admit: 2020-02-20 | Discharge: 2020-02-20 | Disposition: A | Discharge status: Home / Self Care | Payer: Medicare Other | Attending: Urology | Admitting: Urology

## 2020-02-20 ENCOUNTER — Encounter: Payer: Self-pay | Admitting: Urology

## 2020-02-20 ENCOUNTER — Other Ambulatory Visit: Payer: Self-pay

## 2020-02-20 ENCOUNTER — Other Ambulatory Visit: Payer: Self-pay | Admitting: Urology

## 2020-02-20 ENCOUNTER — Ambulatory Visit (INDEPENDENT_AMBULATORY_CARE_PROVIDER_SITE_OTHER): Payer: Medicare Other | Admitting: Urology

## 2020-02-20 VITALS — BP 159/83 | HR 72 | Ht 63.0 in | Wt 213.0 lb

## 2020-02-20 DIAGNOSIS — C679 Malignant neoplasm of bladder, unspecified: Secondary | ICD-10-CM | POA: Diagnosis not present

## 2020-02-20 LAB — URINALYSIS, COMPLETE (UACMP) WITH MICROSCOPIC
Bilirubin Urine: NEGATIVE
Glucose, UA: NEGATIVE mg/dL
Ketones, ur: NEGATIVE mg/dL
Nitrite: NEGATIVE
Protein, ur: NEGATIVE mg/dL
Specific Gravity, Urine: 1.025 (ref 1.005–1.030)
pH: 5 (ref 5.0–8.0)

## 2020-02-20 NOTE — Progress Notes (Signed)
   02/20/20  CC:  Chief Complaint  Patient presents with  . Cysto    HPI: Elizabeth Trujillo is a 84 y.o. 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  CT from 07/02/19 revealed no findings of recurrent malignancy and suspected scarring in the left kidney accounting for the cortical thinning and hypodensities in the mid kidney and lower pole, substantially worsened from 08/01/2018.  She underwent ablative therapy and BCG in 2019. She returned to the operating room on 12/20 for surveillance including a cystoscopy and ureteroscopy w/ NED at that time. Personally discussed w/ radiology regarding her distal ureter malignancy.   Cystoscopy on 07/25/2019 was unremarkable.  Most recent cross-sectional imaging in the form of CT urogram on 07/02/2019 with suspected scarring of the left distal ureter but no obvious filling defects or tumor recurrence.  She returned to the operating room on 11/2019 for surveillance ureteroscopy.  Bilateral  retrograde indicated some slight narrowing of the left distal ureter with prompt drainage, ureteroscopy was unremarkable.  She is asymptomatic today, no urinary issues.  Her urine is mildly suspicious but appears contaminated.  Blood pressure (!) 159/83, pulse 72, height 5\' 3"  (1.6 m), weight 213 lb (96.6 kg). NED. A&Ox3.   No respiratory distress   Abd soft, NT, ND Irritated excoriated external genitalia, normal urethral meatus  She was given a Cipro 500 mg x 1 today.  Cystoscopy Procedure Note  Patient identification was confirmed, informed consent was obtained, and patient was prepped using Betadine solution.  Lidocaine jelly was administered per urethral meatus.    Procedure: - Flexible cystoscope introduced, without any difficulty.   - Thorough search of the bladder revealed:    normal urethral meatus    normal urothelium     no stones    no ulcers     no tumors    no urethral polyps    no trabeculation  - Ureteral orifices were normal in position and appearance.  Post-Procedure: - Patient tolerated the procedure well  Assessment/ Plan:  1. History of bladder cancer/left distal ureter malignancy  Will continue very close surveillance, cystoscopy today no evidence of disease in her bladder  Urine cytology was collected today  We will continue to follow serial upper tract imaging in the form of CT urogram again 3 months from now along with cystoscopy and cytology  She and her husband are agreeable this plan    Hollice Espy, MD

## 2020-02-24 LAB — CYTOLOGY - NON PAP

## 2020-03-17 ENCOUNTER — Other Ambulatory Visit: Payer: Self-pay | Admitting: Family Medicine

## 2020-03-26 ENCOUNTER — Encounter: Payer: Self-pay | Admitting: Family Medicine

## 2020-03-30 ENCOUNTER — Ambulatory Visit: Payer: Self-pay | Admitting: Family Medicine

## 2020-04-13 DIAGNOSIS — G43109 Migraine with aura, not intractable, without status migrainosus: Secondary | ICD-10-CM | POA: Diagnosis not present

## 2020-04-13 DIAGNOSIS — R569 Unspecified convulsions: Secondary | ICD-10-CM | POA: Diagnosis not present

## 2020-04-13 DIAGNOSIS — Z6837 Body mass index (BMI) 37.0-37.9, adult: Secondary | ICD-10-CM | POA: Diagnosis not present

## 2020-04-13 DIAGNOSIS — G939 Disorder of brain, unspecified: Secondary | ICD-10-CM | POA: Diagnosis not present

## 2020-04-27 ENCOUNTER — Other Ambulatory Visit: Payer: Self-pay

## 2020-04-27 ENCOUNTER — Ambulatory Visit: Payer: Medicare Other | Admitting: Family Medicine

## 2020-04-27 ENCOUNTER — Ambulatory Visit (INDEPENDENT_AMBULATORY_CARE_PROVIDER_SITE_OTHER): Payer: Medicare Other | Admitting: Family Medicine

## 2020-04-27 ENCOUNTER — Encounter: Payer: Self-pay | Admitting: Family Medicine

## 2020-04-27 VITALS — BP 133/84 | HR 89 | Temp 98.2°F | Resp 18 | Ht 63.0 in | Wt 217.0 lb

## 2020-04-27 DIAGNOSIS — R928 Other abnormal and inconclusive findings on diagnostic imaging of breast: Secondary | ICD-10-CM

## 2020-04-27 DIAGNOSIS — M1990 Unspecified osteoarthritis, unspecified site: Secondary | ICD-10-CM

## 2020-04-27 DIAGNOSIS — R7303 Prediabetes: Secondary | ICD-10-CM

## 2020-04-27 DIAGNOSIS — Z780 Asymptomatic menopausal state: Secondary | ICD-10-CM

## 2020-04-27 DIAGNOSIS — G40909 Epilepsy, unspecified, not intractable, without status epilepticus: Secondary | ICD-10-CM

## 2020-04-27 DIAGNOSIS — E7800 Pure hypercholesterolemia, unspecified: Secondary | ICD-10-CM

## 2020-04-27 DIAGNOSIS — Z6838 Body mass index (BMI) 38.0-38.9, adult: Secondary | ICD-10-CM | POA: Diagnosis not present

## 2020-04-27 DIAGNOSIS — M25562 Pain in left knee: Secondary | ICD-10-CM

## 2020-04-27 DIAGNOSIS — E119 Type 2 diabetes mellitus without complications: Secondary | ICD-10-CM | POA: Diagnosis not present

## 2020-04-27 DIAGNOSIS — I10 Essential (primary) hypertension: Secondary | ICD-10-CM | POA: Diagnosis not present

## 2020-04-27 DIAGNOSIS — C679 Malignant neoplasm of bladder, unspecified: Secondary | ICD-10-CM

## 2020-04-27 DIAGNOSIS — G8929 Other chronic pain: Secondary | ICD-10-CM

## 2020-04-27 MED ORDER — METFORMIN HCL 500 MG PO TABS: 500.0000 mg | ORAL_TABLET | Freq: Every day | ORAL | 5 refills | Status: DC

## 2020-04-27 NOTE — Patient Instructions (Signed)
Try Metamucil daily and eliminate dairy.

## 2020-04-27 NOTE — Progress Notes (Signed)
I,Elizabeth Trujillo,acting as a scribe for Elizabeth Durie, MD.,have documented all relevant documentation on the behalf of Elizabeth Durie, MD,as directed by  Elizabeth Durie, MD while in the presence of Elizabeth Durie, MD.   Established patient visit   [name]   DOB: 1935-08-01   85 y.o. Female  MRN: [id number] Visit Date: 04/27/2020  Today's healthcare provider: [name], MD   Chief Complaint  Patient presents with  . Follow-up  . Hyperlipidemia  . Prediabetes   Subjective    HPI  Patient comes in today for follow-up.  He is being actively followed by Dr. Manuella Trujillo for likely seizures..  She had evidently had another seizure in mid January and Elizabeth increase her Keppra to 500 mg twice a day.  EEG is pending. She has had status post right knee replacement and she has left knee arthritis. He is in for follow-up of her diabetes. She would like to have her chest examined due to history of breast cancer Lipid/Cholesterol, follow-up  Last Lipid Panel: Lab Results  Component Value Date   CHOL 198 09/29/2019   LDLCALC 101 (H) 09/29/2019   HDL 39 (L) 09/29/2019   TRIG 344 (H) 09/29/2019    She was last seen for this 7 months ago.  Management since that visit includes; labs checked showing-stable. Advised to work on diet and exercise to try to lower lipids/triglycerides.  She reports good compliance with treatment. She is not having side effects. none She is following a Regular, Low Sodium diet. Current exercise: none  Last metabolic panel Lab Results  Component Value Date   GLUCOSE 102 (H) 09/29/2019   NA 142 09/29/2019   K 4.0 09/29/2019   BUN 12 09/29/2019   CREATININE 0.66 09/29/2019   GFRNONAA 83 09/29/2019   GFRAA 95 09/29/2019   CALCIUM 9.0 09/29/2019   AST 17 09/29/2019   ALT 20 09/29/2019   The ASCVD Risk score (Goff DC Jr., et al., 2013) failed to calculate for the following reasons:   The 2013 ASCVD risk score is only  valid for ages 24 to 40  --------------------------------------------------------------------  Prediabetes, Follow-up  Lab Results  Component Value Date   HGBA1C 6.3 (H) 09/29/2019   HGBA1C 6.5 (H) 03/20/2019   HGBA1C 6.1 (H) 09/09/2018   GLUCOSE 102 (H) 09/29/2019   GLUCOSE 106 (H) 03/20/2019   GLUCOSE 102 (H) 10/07/2018    Last seen for for this7 months ago.  Management since that visit includes; no changes made at this visit. Current symptoms include none and have been unchanged.  Prior visit with dietician: no Current diet: well balanced Current exercise: none  Pertinent Labs:    Component Value Date/Time   CHOL 198 09/29/2019 1608   TRIG 344 (H) 09/29/2019 1608   CHOLHDL 5.1 (H) 09/29/2019 1608   CHOLHDL 6.8 09/09/2018 0706   CREATININE 0.66 09/29/2019 1608    Wt Readings from Last 3 Encounters:  04/27/20 217 lb (98.4 kg)  02/20/20 213 lb (96.6 kg)  01/08/20 210 lb (95.3 kg)    --------------------------------------------------------------------       Medications: Outpatient Medications Prior to Visit  Medication Sig  . acetaminophen (TYLENOL) 500 MG tablet Take 1,000 mg by mouth every 6 (six) hours as needed for moderate pain (pain.).   Marland Kitchen aspirin EC 81 MG tablet Take 81 mg by mouth at bedtime.  Marland Kitchen atorvastatin (LIPITOR) 40 MG tablet Take 1 tablet by mouth once daily  . baclofen (  LIORESAL) 10 MG tablet Take 0.5-1 tablets (5-10 mg total) by mouth 3 (three) times daily as needed for muscle spasms.  . Calcium Carb-Cholecalciferol (CALCIUM+D3 PO) Take 1 tablet by mouth daily.   . cholecalciferol (VITAMIN D) 25 MCG (1000 UT) tablet Take 1,000 Units by mouth at bedtime.  . hydrocortisone 2.5 % ointment Apply topically.  Marland Kitchen ketoconazole (NIZORAL) 2 % shampoo Apply 1 application topically as needed (flaky/itchy scalp). Up to once per week  . levETIRAcetam (KEPPRA) 250 MG tablet Take 250 mg by mouth 2 (two) times daily.   Marland Kitchen levETIRAcetam (KEPPRA) 500 MG tablet Take  500 mg by mouth. 500 mg bid for one month  . levETIRAcetam (KEPPRA) 500 MG tablet Take by mouth.  . Multiple Vitamin (MULTIVITAMIN WITH MINERALS) TABS tablet Take 1 tablet by mouth daily at 2 PM.  . Phenazopyridine HCl (AZO DINE PO) Take by mouth.  . Probiotic Product (CULTURELLE PROBIOTICS PO) Take 1 capsule by mouth daily. (Patient not taking: Reported on 04/27/2020)   No facility-administered medications prior to visit.    Review of Systems  Constitutional: Negative for appetite change, chills, fatigue and fever.  Respiratory: Negative for chest tightness and shortness of breath.   Cardiovascular: Negative for chest pain and palpitations.  Gastrointestinal: Negative for abdominal pain, nausea and vomiting.  Neurological: Negative for dizziness and weakness.        Objective    BP 133/84 (BP Location: Right Arm, Patient Position: Sitting, Cuff Size: Large)   Pulse 89   Temp 98.2 F (36.8 C) (Oral)   Resp 18   Ht 5\' 3"  (1.6 m)   Wt 217 lb (98.4 kg)   SpO2 96%   BMI 38.44 kg/m  BP Readings from Last 3 Encounters:  04/27/20 133/84  02/20/20 (!) 159/83  01/08/20 (!) 152/82   Wt Readings from Last 3 Encounters:  04/27/20 217 lb (98.4 kg)  02/20/20 213 lb (96.6 kg)  01/08/20 210 lb (95.3 kg)       Physical Exam Vitals reviewed.  Constitutional:      Appearance: She is obese.  HENT:     Right Ear: External ear normal.     Left Ear: External ear normal.  Eyes:     General: No scleral icterus.    Conjunctiva/sclera: Conjunctivae normal.  Cardiovascular:     Rate and Rhythm: Normal rate and regular rhythm.     Heart sounds: Normal heart sounds.  Pulmonary:     Breath sounds: Normal breath sounds.  Chest:  Breasts:     Right: Normal.     Left: Absent.    Abdominal:     Palpations: Abdomen is soft.  Musculoskeletal:     Right lower leg: Edema present.     Left lower leg: Edema present.     Comments: 1+ ankle/pedal edema.  Skin:    General: Skin is warm and  dry.  Neurological:     Mental Status: She is alert and oriented to person, place, and time.  Psychiatric:        Mood and Affect: Mood normal.        Behavior: Behavior normal.        Thought Content: Thought content normal.        Judgment: Judgment normal.       No results found for any visits on 04/27/20.  Assessment & Plan     1. Hypercholesterolemia On atorvastatin 40 mg.  LDL goal less than 70. - Lipid panel - Comprehensive Metabolic  Panel (CMET)  2. Prediabetes Weight gain patient is now progressed to diabetes as her A1c has gone from 6.3-6.6.  Start Metformin 500 mg daily. - POCT glycosylated hemoglobin (Hb A1C) - Lipid panel - Comprehensive Metabolic Panel (CMET)  3. Essential hypertension I did add low-dose losartan on next. - Lipid panel - Comprehensive Metabolic Panel (CMET)  4. Type 2 diabetes mellitus without complication, without long-term current use of insulin (HCC) Metformin.  And exercise stressed. - Lipid panel - Comprehensive Metabolic Panel (CMET) - metFORMIN (GLUCOPHAGE) 500 MG tablet; Take 1 tablet (500 mg total) by mouth daily with breakfast.  Dispense: 30 tablet; Refill: 5  5. Abnormal mammogram of both breasts  - MM DIAG BREAST TOMO BILATERAL - MM DIAG BREAST TOMO UNI RIGHT - MM DIAG BREAST TOMO UNI LEFT  6. Osteoarthritis, unspecified osteoarthritis type, unspecified site  - DG Bone Density  7. Postmenopausal  - DG Bone Density  8. Primary hypertension   9. Urothelial carcinoma of bladder ([place]) By urology  10. Class 2 severe obesity due to excess calories with serious comorbidity and body mass index (BMI) of 38.0 to 38.9 in adult ([place])   11. Chronic pain of left knee   12. Seizure disorder [facility]) Per  neurology   Return in about 4 months (around 08/25/2020).      I, Elizabeth Durie, MD, have reviewed all documentation for this visit. The documentation on 04/30/20 for the exam, diagnosis, procedures, and orders are  all accurate and complete.    Richard Cranford Mon, MD  [facility] [phone] (phone) [phone] (fax)  [place]

## 2020-04-28 LAB — COMPREHENSIVE METABOLIC PANEL
ALT: 13 IU/L (ref 0–32)
AST: 13 IU/L (ref 0–40)
Albumin/Globulin Ratio: 1.6 (ref 1.2–2.2)
Albumin: 4.1 g/dL (ref 3.6–4.6)
Alkaline Phosphatase: 131 IU/L — ABNORMAL HIGH (ref 44–121)
BUN/Creatinine Ratio: 18 (ref 12–28)
BUN: 12 mg/dL (ref 8–27)
Bilirubin Total: 0.6 mg/dL (ref 0.0–1.2)
CO2: 20 mmol/L (ref 20–29)
Calcium: 9.4 mg/dL (ref 8.7–10.3)
Chloride: 105 mmol/L (ref 96–106)
Creatinine, Ser: 0.65 mg/dL (ref 0.57–1.00)
GFR calc Af Amer: 96 mL/min/{1.73_m2} (ref >59)
GFR calc non Af Amer: 83 mL/min/{1.73_m2} (ref >59)
Globulin, Total: 2.5 g/dL (ref 1.5–4.5)
Glucose: 107 mg/dL — ABNORMAL HIGH (ref 65–99)
Potassium: 4.4 mmol/L (ref 3.5–5.2)
Sodium: 143 mmol/L (ref 134–144)
Total Protein: 6.6 g/dL (ref 6.0–8.5)

## 2020-04-28 LAB — LIPID PANEL
Chol/HDL Ratio: 4.9 ratio — ABNORMAL HIGH (ref 0.0–4.4)
Cholesterol, Total: 221 mg/dL — ABNORMAL HIGH (ref 100–199)
HDL: 45 mg/dL (ref >39)
LDL Chol Calc (NIH): 118 mg/dL — ABNORMAL HIGH (ref 0–99)
Triglycerides: 331 mg/dL — ABNORMAL HIGH (ref 0–149)
VLDL Cholesterol Cal: 58 mg/dL — ABNORMAL HIGH (ref 5–40)

## 2020-05-05 LAB — POCT GLYCOSYLATED HEMOGLOBIN (HGB A1C)
Est. average glucose Bld gHb Est-mCnc: 143
Hemoglobin A1C: 6.6 % — AB (ref 4.0–5.6)

## 2020-05-12 ENCOUNTER — Telehealth: Payer: Self-pay

## 2020-05-12 DIAGNOSIS — R310 Gross hematuria: Secondary | ICD-10-CM

## 2020-05-12 NOTE — Telephone Encounter (Signed)
Patient called complaining of blood in her urine and wanted to give you a heads up. She was not sure if Dr Erlene Quan would like to do anything further or do a different cystoscopy.

## 2020-05-12 NOTE — Telephone Encounter (Signed)
Patient LMOM returning missed call. Asks for return call to either [phone] or [phone].

## 2020-05-12 NOTE — Telephone Encounter (Signed)
Patient left a message on the triage line asking for a return call. Attempted to return, no answer could not leave vmail mailbox was full

## 2020-05-13 DIAGNOSIS — R569 Unspecified convulsions: Secondary | ICD-10-CM | POA: Diagnosis not present

## 2020-05-13 NOTE — Telephone Encounter (Signed)
Lets still plan on cystoscopy but I have also ordered CT urogram as well.   If her bleeding comes severe she has any other urinary symptoms like burning, however, drop off a UA/urine culture to rule out infection especially prior to cystoscopy.

## 2020-05-13 NOTE — Addendum Note (Signed)
Addended by: Hollice Espy on: 05/13/2020 02:12 PM   Modules accepted: Orders

## 2020-05-14 DIAGNOSIS — E119 Type 2 diabetes mellitus without complications: Secondary | ICD-10-CM | POA: Diagnosis not present

## 2020-05-14 LAB — HM DIABETES EYE EXAM

## 2020-05-14 NOTE — Telephone Encounter (Signed)
Left VM to return call 

## 2020-05-18 ENCOUNTER — Other Ambulatory Visit: Payer: Self-pay

## 2020-05-18 ENCOUNTER — Ambulatory Visit
Admission: RE | Admit: 2020-05-18 | Discharge: 2020-05-18 | Disposition: A | Discharge status: Home / Self Care | Payer: Medicare Other | Source: Ambulatory Visit | Attending: Urology | Admitting: Urology

## 2020-05-18 DIAGNOSIS — Z8554 Personal history of malignant neoplasm of ureter: Secondary | ICD-10-CM | POA: Diagnosis not present

## 2020-05-18 DIAGNOSIS — K449 Diaphragmatic hernia without obstruction or gangrene: Secondary | ICD-10-CM | POA: Diagnosis not present

## 2020-05-18 DIAGNOSIS — R310 Gross hematuria: Secondary | ICD-10-CM | POA: Diagnosis not present

## 2020-05-18 DIAGNOSIS — K5750 Diverticulosis of both small and large intestine without perforation or abscess without bleeding: Secondary | ICD-10-CM | POA: Diagnosis not present

## 2020-05-18 DIAGNOSIS — C679 Malignant neoplasm of bladder, unspecified: Secondary | ICD-10-CM | POA: Diagnosis not present

## 2020-05-18 MED ORDER — IOHEXOL 300 MG/ML  SOLN
125.0000 mL | Freq: Once | INTRAMUSCULAR | Status: AC | PRN
  Administered 2020-05-18: 125 mL via INTRAVENOUS

## 2020-05-19 ENCOUNTER — Encounter: Payer: Self-pay | Admitting: Urology

## 2020-05-20 ENCOUNTER — Telehealth: Payer: Self-pay | Admitting: Family Medicine

## 2020-05-20 DIAGNOSIS — Z853 Personal history of malignant neoplasm of breast: Secondary | ICD-10-CM

## 2020-05-20 DIAGNOSIS — Z1231 Encounter for screening mammogram for malignant neoplasm of breast: Secondary | ICD-10-CM

## 2020-05-20 NOTE — Telephone Encounter (Signed)
Per York Cerise at [facility] if pt has not had a mammogram since 2020 and you are going by result pt will just need a screening right breast mammogram. She has had left breast mastectomy,Thanks

## 2020-05-21 ENCOUNTER — Other Ambulatory Visit: Payer: Self-pay | Admitting: *Deleted

## 2020-05-21 ENCOUNTER — Ambulatory Visit (INDEPENDENT_AMBULATORY_CARE_PROVIDER_SITE_OTHER): Payer: Medicare Other | Admitting: Urology

## 2020-05-21 ENCOUNTER — Other Ambulatory Visit
Admission: RE | Admit: 2020-05-21 | Discharge: 2020-05-21 | Disposition: A | Discharge status: Home / Self Care | Payer: Medicare Other | Attending: Urology | Admitting: Urology

## 2020-05-21 ENCOUNTER — Other Ambulatory Visit: Payer: Self-pay

## 2020-05-21 ENCOUNTER — Encounter: Payer: Self-pay | Admitting: Urology

## 2020-05-21 VITALS — BP 142/83 | HR 87 | Ht 63.0 in | Wt 216.0 lb

## 2020-05-21 DIAGNOSIS — C679 Malignant neoplasm of bladder, unspecified: Secondary | ICD-10-CM | POA: Insufficient documentation

## 2020-05-21 DIAGNOSIS — N390 Urinary tract infection, site not specified: Secondary | ICD-10-CM

## 2020-05-21 DIAGNOSIS — R310 Gross hematuria: Secondary | ICD-10-CM

## 2020-05-21 DIAGNOSIS — C662 Malignant neoplasm of left ureter: Secondary | ICD-10-CM | POA: Diagnosis not present

## 2020-05-21 LAB — URINALYSIS, COMPLETE (UACMP) WITH MICROSCOPIC
Bilirubin Urine: NEGATIVE
Glucose, UA: NEGATIVE mg/dL
Ketones, ur: 5 mg/dL — AB
Nitrite: NEGATIVE
Protein, ur: 30 mg/dL — AB
Specific Gravity, Urine: 1.025 (ref 1.005–1.030)
pH: 5 (ref 5.0–8.0)

## 2020-05-21 NOTE — Progress Notes (Signed)
05/21/2020 3:25 PM   Elizabeth Trujillo Jan 31, 1936 [id number]  Referring provider: [name]., MD [address] [place],  [place] [address]  Chief Complaint  Patient presents with  . 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  Last week, she called her office mentioning that she had another episode of painless gross hematuria.  It lasted about 1 day and then resolved spontaneously.  We did not observe her CT urogram which was performed earlier this week.  This reveals no evidence of GU pathology which is reassuring.  She denies any UTI type symptoms today including no dysuria gross hematuria.  No flank pain.  No clitoral/ vaginal pain which was one of her presenting symptoms.  Her UA is frankly positive today is on numerous other occasions likely representing chronic colonization.  GU history:. Initially presented with gross hematuria had a negative work-up and hematuria persisted.  Ultimately, she developed abnormality on noncontrast CT scan with new hydronephrosis.  She was found to have a left distal ureteral circumferential lesion which was biopsy consistent with high-grade urothelial carcinoma as well as CIS of the bladder adjacent to the UO.  She underwent ablative therapy and BCG x 6 with stent in 28/20. She returned to the operating room on 12/20 for surveillance including a cystoscopy and ureteroscopy w/ NED at that time. Personally discussed w/ radiology regarding her distal ureter malignancy.   Cystoscopy on 07/25/2019 was unremarkable.Most recent cross-sectional imaging in the form of CT urogram on 07/02/2019 with suspected scarring of the left distal ureter but no obvious filling defects or tumor recurrence.  She returned to the operating room on 11/2019 for surveillance ureteroscopy.  Bilateral  retrograde indicated some slight narrowing of  the left distal ureter with prompt drainage, ureteroscopy was unremarkable.  PMH: Past Medical History:  Diagnosis Date  . Arthritis   . Breast cancer ([place]) 1986   left mastectomy  . carcinoma in situ of urinary bladder 2019   BCG tx per pt  . Collagen vascular disease (HCC)    RA   . Elevated triglycerides with high cholesterol   . Family history of bladder cancer   . Family history of breast cancer   . Family history of lung cancer   . GERD (gastroesophageal reflux disease)    history of reflux  . History of cancer of ureter   . Hypercholesterolemia   . Hypertension    history of   . 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 Seizure ([facility])    last 08-2018  . Stroke [facility])    TIA    Surgical History: Past Surgical History:  Procedure Laterality Date  . ABDOMINAL HYSTERECTOMY  [id number]   TAH/BSO  . BIOPSY Left 11/11/2018   Procedure: BIOPSY;  Surgeon: Hollice Espy, MD;  Location: [facility];  Service: Urology;  Laterality: Left;  . BREAST BIOPSY Right 01/23/2013   [place] bx/clip-neg  . BREAST SURGERY Left 07/1984   Mastectomy  . callus removed from left toe    . CATARACT EXTRACTION W/ INTRAOCULAR LENS IMPLANT Left 2015  . CYSTOSCOPY W/ RETROGRADES Left 02/24/2019   Procedure: CYSTOSCOPY WITH RETROGRADE PYELOGRAM;  Surgeon: Hollice Espy, MD;  Location: [facility];  Service: Urology;  Laterality: Left;  . CYSTOSCOPY WITH BIOPSY N/A 02/24/2019   Procedure: CYSTOSCOPY WITH Bladder BIOPSY;  Surgeon: Hollice Espy, MD;  Location: [facility];  Service: Urology;  Laterality: N/A;  . CYSTOSCOPY WITH FULGERATION N/A 02/24/2019   Procedure: CYSTOSCOPY WITH FULGERATION;  Surgeon: Hollice Espy, MD;  Location: [facility];  Service: Urology;  Laterality: N/A;  . CYSTOSCOPY WITH RETROGRADE PYELOGRAM, URETEROSCOPY AND STENT PLACEMENT Left 11/24/2019   Procedure: CYSTOSCOPY WITH RETROGRADE PYELOGRAM, URETEROSCOPY;  Surgeon: Hollice Espy, MD;  Location: [facility];  Service: Urology;  Laterality: Left;  . CYSTOSCOPY WITH URETEROSCOPY AND STENT PLACEMENT Left 10/14/2018   Procedure: CYSTOSCOPY WITH URETEROSCOPY AND STENT PLACEMENT;  Surgeon: Hollice Espy, MD;  Location: [facility];  Service: Urology;  Laterality: Left;  . CYSTOSCOPY WITH URETEROSCOPY AND STENT PLACEMENT Left 02/24/2019   Procedure: CYSTOSCOPY WITH URETEROSCOPY AND STENT Exchange;  Surgeon: Hollice Espy, MD;  Location: [facility];  Service: Urology;  Laterality: Left;  . CYSTOSCOPY/URETEROSCOPY/HOLMIUM LASER/STENT PLACEMENT Left 11/11/2018   Procedure: CYSTOSCOPY/URETEROSCOPY//STENT EXCHANGE;  Surgeon: Hollice Espy, MD;  Location: [facility];  Service: Urology;  Laterality: Left;  . EYE SURGERY Left 2013  . FOOT SURGERY Left 04/2013  . HOLMIUM LASER APPLICATION Left 04/19/8339   Procedure: HOLMIUM LASER APPLICATION;  Surgeon: Hollice Espy, MD;  Location: [facility];  Service: Urology;  Laterality: Left;  . JOINT REPLACEMENT    . KNEE ARTHROPLASTY Right 11/03/2015   Procedure: COMPUTER ASSISTED TOTAL KNEE ARTHROPLASTY;  Surgeon: Dereck Leep, MD;  Location: [facility];  Service: Orthopedics;  Laterality: Right;  . MASTECTOMY Left 1986   positive  . URETERAL BIOPSY Left 10/14/2018   Procedure: URETERAL BIOPSY;  Surgeon: Hollice Espy, MD;  Location: [facility];  Service: Urology;  Laterality: Left;  . URETERAL BIOPSY Left 02/24/2019   Procedure: URETERAL BIOPSY;  Surgeon: Hollice Espy, MD;  Location: [facility];  Service: Urology;  Laterality: Left;    Home Medications:  Allergies as of 05/21/2020      Reactions   Penicillin V Potassium Hives   Has patient had a PCN reaction causing immediate rash, facial/tongue/throat swelling, SOB or lightheadedness with hypotension: no Has patient had a PCN reaction causing severe rash involving mucus membranes or skin necrosis: {no Has patient had a PCN reaction that required hospitalization no Has patient had a PCN  reaction occurring within the last 10 years: no If all of the above answers are "NO", then may proceed with Cephalosporin use.   Sulfa Antibiotics Hives      Medication List       Accurate as of May 21, 2020  3:25 PM. If you have any questions, ask your nurse or doctor.        STOP taking these medications   baclofen 10 MG tablet Commonly known as: LIORESAL Stopped by: Hollice Espy, MD   CULTURELLE PROBIOTICS PO Stopped by: Hollice Espy, MD     TAKE these medications   acetaminophen 500 MG tablet Commonly known as: TYLENOL Take 1,000 mg by mouth every 6 (six) hours as needed for moderate pain (pain.).   aspirin EC 81 MG tablet Take 81 mg by mouth at bedtime.   atorvastatin 40 MG tablet Commonly known as: LIPITOR Take 1 tablet by mouth once daily   AZO DINE PO Take by mouth.   CALCIUM+D3 PO Take 1 tablet by mouth daily.   cholecalciferol 25 MCG (1000 UNIT) tablet Commonly known as: VITAMIN D Take 1,000 Units by mouth at bedtime.   hydrocortisone 2.5 % ointment Apply topically.   ketoconazole 2 % shampoo Commonly known as: NIZORAL Apply 1 application topically  as needed (flaky/itchy scalp). Up to once per week   levETIRAcetam 500 MG tablet Commonly known as: KEPPRA Take 500 mg by mouth. 500 mg bid for one month What changed: Another medication with the same name was removed. Continue taking this medication, and follow the directions you see here. Changed by: Hollice Espy, MD   metFORMIN 500 MG tablet Commonly known as: GLUCOPHAGE Take 1 tablet (500 mg total) by mouth daily with breakfast.   multivitamin with minerals Tabs tablet Take 1 tablet by mouth daily at 2 PM.       Allergies:  Allergies  Allergen Reactions  . Penicillin V Potassium Hives    Has patient had a PCN reaction causing immediate rash, facial/tongue/throat swelling, SOB or lightheadedness with hypotension: no Has patient had a PCN reaction causing severe rash involving mucus  membranes or skin necrosis: {no Has patient had a PCN reaction that required hospitalization no Has patient had a PCN reaction occurring within the last 10 years: no If all of the above answers are "NO", then may proceed with Cephalosporin use.  . Sulfa Antibiotics Hives    Family History: Family History  Problem Relation Age of Onset  . Glaucoma Mother   . Kidney disease Mother   . Arthritis Mother   . Stroke Mother   . Breast cancer Mother 29  . Bladder Cancer Mother 78  . Heart disease Father   . Cancer Brother 62       lung cancer, smoker  . Breast cancer Cousin        3 mat cousins dx late 76s  . Breast cancer Cousin        1 mat cousin dx 56s-60s    Social History:  reports that she has never smoked. She has never used smokeless tobacco. She reports that she does not drink alcohol and does not use drugs.   Physical Exam: BP (!) 142/83   Pulse 87   Ht 5\' 3"  (1.6 m)   Wt 216 lb (98 kg)   BMI 38.26 kg/m   Constitutional:  Alert and oriented, No acute distress. HEENT: [place] AT, moist mucus membranes.  Trachea midline, no masses. Cardiovascular: No clubbing, cyanosis, or edema. Respiratory: Normal respiratory effort, no increased work of breathing. Skin: No rashes, bruises or suspicious lesions. Neurologic: Grossly intact, no focal deficits, moving all 4 extremities. Psychiatric: Normal mood and affect.  Laboratory Data: Lab Results  Component Value Date   WBC 9.0 09/29/2019   HGB 13.7 09/29/2019   HCT 39.9 09/29/2019   MCV 90 09/29/2019   PLT 301 09/29/2019    Lab Results  Component Value Date   CREATININE 0.65 04/27/2020     Lab Results  Component Value Date   HGBA1C 6.6 (A) 05/05/2020    Urinalysis Results for orders placed or performed during the hospital encounter of 05/21/20  Urine Culture   Specimen: Urine, Random  Result Value Ref Range   Specimen Description      URINE, RANDOM Performed at [facility], [address], [place], [place] [address]    Special Requests      NONE Performed at [facility] [place], [address], [place], [place] [address]    Culture      NO GROWTH Performed at [facility], [place] [address], [place], [place] [address]    Report Status 05/23/2020 FINAL   Urinalysis, Complete w Microscopic  Result Value Ref Range   Color, Urine YELLOW YELLOW  APPearance HAZY (A) CLEAR   Specific Gravity, Urine 1.025 1.005 - 1.030   pH 5.0 5.0 - 8.0   Glucose, UA NEGATIVE NEGATIVE mg/dL   Hgb urine dipstick SMALL (A) NEGATIVE   Bilirubin Urine NEGATIVE NEGATIVE   Ketones, ur 5 (A) NEGATIVE mg/dL   Protein, ur 30 (A) NEGATIVE mg/dL   Nitrite NEGATIVE NEGATIVE   Leukocytes,Ua MODERATE (A) NEGATIVE   Squamous Epithelial / LPF 6-10 0 - 5   WBC, UA 6-10 0 - 5 WBC/hpf   RBC / HPF 0-5 0 - 5 RBC/hpf   Bacteria, UA MANY (A) NONE SEEN   WBC Clumps PRESENT      Pertinent Imaging: CT HEMATURIA WORKUP  Narrative CLINICAL DATA:  Gross hematuria, multiple episodes, history of left ureteral cancer in 2019, status post resection and BCG therapy  EXAM: CT ABDOMEN AND PELVIS WITHOUT AND WITH CONTRAST  TECHNIQUE: Multidetector CT imaging of the abdomen and pelvis was performed following the standard protocol before and following the bolus administration of intravenous contrast.  CONTRAST:  134mL OMNIPAQUE IOHEXOL 300 MG/ML  SOLN  COMPARISON:  04/02/2019  FINDINGS: Lower chest: No acute abnormality.  Small hiatal hernia.  Hepatobiliary: No solid liver abnormality is seen. No gallstones, gallbladder wall thickening, or biliary dilatation.  Pancreas: Unremarkable. No pancreatic ductal dilatation or surrounding inflammatory changes.  Spleen: Normal in size without significant abnormality.  Adrenals/Urinary Tract: Adrenal glands are unremarkable. Unchanged cortical lobulation of the anterior midportion of the left kidney, likely related to prior infection (series 9, image  26). Kidneys are otherwise normal, without renal calculi, solid lesion, or hydronephrosis. No urinary tract filling defect on delayed phase imaging. Bladder is unremarkable.  Stomach/Bowel: Stomach is within normal limits. Appendix appears normal. No evidence of bowel wall thickening, distention, or inflammatory changes. Descending and sigmoid diverticulosis.  Vascular/Lymphatic: Aortic atherosclerosis. No enlarged abdominal or pelvic lymph nodes.  Reproductive: Status post hysterectomy.  Other: No abdominal wall hernia or abnormality. No abdominopelvic ascites.  Musculoskeletal: No acute or significant osseous findings.  IMPRESSION: 1. No CT findings to explain hematuria. No evidence of urinary tract calculus, mass, or filling defect on delayed phase imaging. 2. Status post hysterectomy. 3. Small hiatal hernia. 4. Descending and sigmoid diverticulosis.  Aortic Atherosclerosis (ICD10-I70.0).   Electronically Signed By: Eddie Candle M.D. On: 05/19/2020 11:00  CT urogram was personally reviewed today, agree with radiologic interpretation.  There is no evidence of disease recurrence.  Assessment & Plan:    1. 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 Malignant neoplasm of urinary bladder, unspecified site [facility]) Urine cytology today, reschedule cystoscopy - Cytology - Non PAP;  3. Recurrent UTI Suspect chronic bacterial colonization, again, will hold off on cystoscopy today in light of possible UTI and concern for visualization.  We will send off culture and treat her in advance of reschedule cystoscopy likely in 2 weeks  She is agreeable this plan - Urine Culture; Future  4. Gross hematuria New onset gross hematuria, painless of unclear etiology.  Self-limited.  Possibly related to chronic bacteriuria or possibly disease recurrence reschedule  cystoscopy for in 2 weeks.  CT urogram is reassuring.  Hollice Espy, MD  [facility] [address] [place], [place] [address] [phone]  I spent 30 total minutes on the day of the encounter including pre-visit review of the medical record, face-to-face time  with the patient, and post visit ordering of labs/imaging/tests.

## 2020-05-21 NOTE — Telephone Encounter (Signed)
Order changed.

## 2020-05-23 LAB — URINE CULTURE: Culture: NO GROWTH

## 2020-05-24 ENCOUNTER — Telehealth: Payer: Self-pay

## 2020-05-24 MED ORDER — NITROFURANTOIN MONOHYD MACRO 100 MG PO CAPS: 100.0000 mg | ORAL_CAPSULE | Freq: Two times a day (BID) | ORAL | 0 refills | Status: DC

## 2020-05-24 NOTE — Telephone Encounter (Signed)
Patient notified and script sent to the pharmacy

## 2020-05-24 NOTE — Telephone Encounter (Signed)
-----   Message from Hollice Espy, MD sent at 05/23/2020  2:52 PM EDT ----- Urine culture did not end up growing any bacteria.  I would recommend Macrobid 100 mg twice daily for the next 10 days as a precaution.  Plan for cystoscopy the Friday after next as scheduled.  Hollice Espy

## 2020-05-25 LAB — CYTOLOGY - NON PAP

## 2020-06-04 ENCOUNTER — Encounter: Payer: Self-pay | Admitting: Urology

## 2020-06-04 ENCOUNTER — Other Ambulatory Visit: Payer: Self-pay

## 2020-06-04 ENCOUNTER — Ambulatory Visit (INDEPENDENT_AMBULATORY_CARE_PROVIDER_SITE_OTHER): Payer: Medicare Other | Admitting: Urology

## 2020-06-04 VITALS — BP 138/84 | HR 98 | Ht 63.0 in | Wt 215.0 lb

## 2020-06-04 DIAGNOSIS — C662 Malignant neoplasm of left ureter: Secondary | ICD-10-CM | POA: Diagnosis not present

## 2020-06-04 MED ORDER — NITROFURANTOIN MONOHYD MACRO 100 MG PO CAPS: 100.0000 mg | ORAL_CAPSULE | Freq: Two times a day (BID) | ORAL | 0 refills | Status: DC

## 2020-06-04 NOTE — Progress Notes (Signed)
   06/04/20  CC:  Chief Complaint  Patient presents with  . Cysto    HPI: Elizabeth Trujillo is a 85 y.o. 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  CT from 07/02/19 revealed no findings of recurrent malignancy and suspected scarring in the left kidney accounting for the cortical thinning and hypodensities in the mid kidney and lower pole, substantially worsened from 08/01/2018.  She underwent ablative therapy and BCG in 2019. She returned to the operating room on 12/20 for surveillance including a cystoscopy and ureteroscopy w/ NED at that time. Personally discussed w/ radiology regarding her distal ureter malignancy.   Cystoscopy on 07/25/2019 was unremarkable.  Most recent cross-sectional imaging in the form of CT urogram on 07/02/2019 with suspected scarring of the left distal ureter but no obvious filling defects or tumor recurrence.  She returned to the operating room on 11/2019 for surveillance ureteroscopy.  Bilateral  retrograde indicated some slight narrowing of the left distal ureter with prompt drainage, ureteroscopy was unremarkable.  Since her last cystoscopy, she developed an episode of painless gross hematuria.  She underwent interval with CT urogram on 05/19/2020 which was unremarkable.  Urine cytology was negative.  Blood pressure (!) 159/83, pulse 72, height 5\' 3"  (1.6 m), weight 213 lb (96.6 kg). NED. A&Ox3.   No respiratory distress   Abd soft, NT, ND Irritated excoriated external genitalia, normal urethral meatus  She was given a Cipro 500 mg x 1 today.  Cystoscopy Procedure Note  Patient identification was confirmed, informed consent was obtained, and patient was prepped using Betadine solution.  Lidocaine jelly was administered per urethral meatus.    Procedure: - Flexible cystoscope introduced, without any difficulty.   -  Thorough search of the bladder revealed:    normal urethral meatus    normal urothelium with some slight cobblestoning type changes on the trigone consistent with trigonitis, there is stellate scar on the left lateral bladder wall    no stones    no ulcers     no tumors    no urethral polyps    no trabeculation  - Ureteral orifices were normal in position and appearance.  Post-Procedure: - Patient tolerated the procedure well  Assessment/ Plan:  1. History of bladder cancer/left distal ureter malignancy  Will continue very close surveillance, cystoscopy today no evidence of disease in her bladder  Plan for cystoscopy again in 3 months with cytology  Given her history of recurrent infections and frequent cancellation of cystoscopy is, we will have her go ahead and start Macrobid 3 days before her next cystoscopic procedure for a total of 5-day course.   Hollice Espy, MD

## 2020-06-04 NOTE — Patient Instructions (Addendum)
Follow up in 3 months with a Cysto. Start your antibiotic three days prior to your procedure. Remember to come 1 hour prior to your procedure to provide a urine sample to check for infection

## 2020-06-09 ENCOUNTER — Other Ambulatory Visit: Payer: Self-pay | Admitting: Family Medicine

## 2020-06-09 DIAGNOSIS — Z1231 Encounter for screening mammogram for malignant neoplasm of breast: Secondary | ICD-10-CM

## 2020-06-11 ENCOUNTER — Other Ambulatory Visit: Payer: Self-pay | Admitting: Family Medicine

## 2020-06-22 ENCOUNTER — Ambulatory Visit
Admission: RE | Admit: 2020-06-22 | Discharge: 2020-06-22 | Disposition: A | Discharge status: Home / Self Care | Payer: Medicare Other | Source: Ambulatory Visit | Attending: Family Medicine | Admitting: Family Medicine

## 2020-06-22 ENCOUNTER — Other Ambulatory Visit: Payer: Self-pay

## 2020-06-22 ENCOUNTER — Other Ambulatory Visit: Payer: Self-pay | Admitting: Family Medicine

## 2020-06-22 DIAGNOSIS — Z1231 Encounter for screening mammogram for malignant neoplasm of breast: Secondary | ICD-10-CM | POA: Diagnosis not present

## 2020-06-22 DIAGNOSIS — N951 Menopausal and female climacteric states: Secondary | ICD-10-CM | POA: Diagnosis not present

## 2020-06-22 DIAGNOSIS — Z780 Asymptomatic menopausal state: Secondary | ICD-10-CM | POA: Diagnosis not present

## 2020-06-22 DIAGNOSIS — M85852 Other specified disorders of bone density and structure, left thigh: Secondary | ICD-10-CM | POA: Diagnosis not present

## 2020-06-22 LAB — HM DEXA SCAN: HM Dexa Scan: NORMAL

## 2020-07-12 ENCOUNTER — Ambulatory Visit: Payer: Self-pay

## 2020-07-12 NOTE — Telephone Encounter (Signed)
Pt asking if she should get the [place] versus [employer] vaccination for her and her husband's second booster. Advised pt that per the CDC vaccs can be mixed or match and no data from the [facility] favors one versus the other. Pt and her husband scheduled for 2nd booster tomorrow. Pt worried that her husbands first booster was not documented. Advised pt to call lost vaccination card hot line. Pt very worried about husband not having card. Searched DHHS site and advised using the portal but pt unable to do that.

## 2020-07-13 ENCOUNTER — Ambulatory Visit: Payer: Medicare Other | Attending: Internal Medicine

## 2020-07-13 DIAGNOSIS — Z23 Encounter for immunization: Secondary | ICD-10-CM

## 2020-07-13 NOTE — Progress Notes (Signed)
   Covid-19 Vaccination Clinic  Name:  Elizabeth Trujillo    MRN: [id number] DOB: 1935-12-03  07/13/2020  [name] was observed post Covid-19 immunization for 15 minutes without incident. She was provided with Vaccine Information Sheet and instruction to access the V-Safe system.   [name] was instructed to call 911 with any severe reactions post vaccine: Marland Kitchen Difficulty breathing  . Swelling of face and throat  . A fast heartbeat  . A bad rash all over body  . Dizziness and weakness   Immunizations Administered    Name Date Dose VIS Date Route   PFIZER Comrnaty(Gray TOP) Covid-19 Vaccine 07/13/2020  2:58 PM 0.3 mL 02/19/2020 Intramuscular   Manufacturer: [place]   Lot: [id number]   NDC: [place], PharmD, MBA Clinical Pharmacist

## 2020-07-14 ENCOUNTER — Other Ambulatory Visit: Payer: Self-pay

## 2020-07-14 MED ORDER — PFIZER-BIONT COVID-19 VAC-TRIS 30 MCG/0.3ML IM SUSP
INTRAMUSCULAR | 0 refills | Status: DC
  Filled 2020-07-14: qty 0.3, 1d supply, fill #0

## 2020-08-12 DIAGNOSIS — R569 Unspecified convulsions: Secondary | ICD-10-CM | POA: Diagnosis not present

## 2020-08-24 DIAGNOSIS — G43109 Migraine with aura, not intractable, without status migrainosus: Secondary | ICD-10-CM | POA: Diagnosis not present

## 2020-08-24 DIAGNOSIS — R569 Unspecified convulsions: Secondary | ICD-10-CM | POA: Diagnosis not present

## 2020-08-24 DIAGNOSIS — G939 Disorder of brain, unspecified: Secondary | ICD-10-CM | POA: Diagnosis not present

## 2020-08-25 ENCOUNTER — Other Ambulatory Visit: Payer: Self-pay

## 2020-08-25 ENCOUNTER — Ambulatory Visit (INDEPENDENT_AMBULATORY_CARE_PROVIDER_SITE_OTHER): Payer: Medicare Other | Admitting: Family Medicine

## 2020-08-25 DIAGNOSIS — E7800 Pure hypercholesterolemia, unspecified: Secondary | ICD-10-CM

## 2020-08-25 DIAGNOSIS — E119 Type 2 diabetes mellitus without complications: Secondary | ICD-10-CM

## 2020-08-25 DIAGNOSIS — M778 Other enthesopathies, not elsewhere classified: Secondary | ICD-10-CM

## 2020-08-25 DIAGNOSIS — R195 Other fecal abnormalities: Secondary | ICD-10-CM

## 2020-08-25 DIAGNOSIS — I10 Essential (primary) hypertension: Secondary | ICD-10-CM

## 2020-08-25 DIAGNOSIS — M7582 Other shoulder lesions, left shoulder: Secondary | ICD-10-CM

## 2020-08-25 DIAGNOSIS — M25569 Pain in unspecified knee: Secondary | ICD-10-CM

## 2020-08-25 LAB — POCT GLYCOSYLATED HEMOGLOBIN (HGB A1C): Hemoglobin A1C: 6.2 % — AB (ref 4.0–5.6)

## 2020-08-25 MED ORDER — PREDNISONE 10 MG (21) PO TBPK: ORAL_TABLET | ORAL | 0 refills | Status: DC

## 2020-08-25 NOTE — Progress Notes (Signed)
Established patient visit   [name]   DOB: [birth year]   85 y.o. Female  MRN: [id number] Visit Date: 08/25/2020  Today's healthcare provider: [name], MD   Chief Complaint  Patient presents with   Diabetes   Hypertension   Hyperlipidemia   Subjective    HPI  Patient comes in today for follow-up.  She has chronic mild anxiety and saw neurology yesterday.  She wonders about should she continue aspirin. She has chronic arthralgias with left greater than right shoulder pain and left greater than right knee pain. She has chronic gas pains.  No problems with bowel movements no nausea or vomiting.  She has not tried anything for that. Diabetes Mellitus Type II, follow-up  Lab Results  Component Value Date   HGBA1C 6.2 (A) 08/25/2020   HGBA1C 6.6 (A) 05/05/2020   HGBA1C 6.3 (H) 09/29/2019   Last seen for diabetes 4 months ago.  Management since then includes starting on Metformin 500mg   She reports good compliance with treatment. She is not having side effects.   Home blood sugar records: trend: stable  Episodes of hypoglycemia? No    Current insulin regiment:  Most Recent Eye Exam: 2021   Hypertension, follow-up  BP Readings from Last 3 Encounters:  06/04/20 138/84  05/21/20 (!) 142/83  04/27/20 133/84   Wt Readings from Last 3 Encounters:  06/04/20 215 lb (97.5 kg)  05/21/20 216 lb (98 kg)  04/27/20 217 lb (98.4 kg)     She was last seen for hypertension 4 months ago.  BP at that visit was 138/84. Management since that visit includes no changes. She reports good compliance with treatment. She is not having side effects.  She is not exercising. She is adherent to low salt diet.   Outside blood pressures are in good range less than 140.  She does not smoke.  Use of agents associated with hypertension: none.    Lipid/Cholesterol, follow-up  Last Lipid Panel: Lab Results  Component Value Date   CHOL 221 (H) 04/27/2020    LDLCALC 118 (H) 04/27/2020   HDL 45 04/27/2020   TRIG 331 (H) 04/27/2020    She was last seen for this 4 months ago.  Management since that visit includes no changes.  She reports good compliance with treatment. She is not having side effects.   Symptoms: No appetite changes No foot ulcerations  No chest pain No chest pressure/discomfort  No dyspnea No orthopnea  Yes fatigue Yes lower extremity edema  No palpitations No paroxysmal nocturnal dyspnea  No nausea No numbness or tingling of extremity  No polydipsia No polyuria  No speech difficulty No syncope   She is following a Low fat diet. Current exercise: none  Last metabolic panel Lab Results  Component Value Date   GLUCOSE 107 (H) 04/27/2020   NA 143 04/27/2020   K 4.4 04/27/2020   BUN 12 04/27/2020   CREATININE 0.65 04/27/2020   GFRNONAA 83 04/27/2020   GFRAA 96 04/27/2020   CALCIUM 9.4 04/27/2020   AST 13 04/27/2020   ALT 13 04/27/2020   The ASCVD Risk score Mikey Bussing DC Jr., et al., 2013) failed to calculate for the following reasons:   The 2013 ASCVD risk score is only valid for ages 26 to 16       Medications: Outpatient Medications Prior to Visit  Medication Sig   acetaminophen (TYLENOL) 500 MG tablet Take 1,000 mg by mouth every 6 (six) hours  as needed for moderate pain (pain.).    aspirin EC 81 MG tablet Take 81 mg by mouth at bedtime.   atorvastatin (LIPITOR) 40 MG tablet Take 1 tablet by mouth once daily   Calcium Carb-Cholecalciferol (CALCIUM+D3 PO) Take 1 tablet by mouth daily.    cholecalciferol (VITAMIN D) 25 MCG (1000 UT) tablet Take 1,000 Units by mouth at bedtime.   COVID-19 mRNA Vac-TriS, Pfizer, (PFIZER-BIONT COVID-19 VAC-TRIS) SUSP injection Inject into the muscle.   hydrocortisone 2.5 % ointment Apply topically.   ketoconazole (NIZORAL) 2 % shampoo Apply 1 application topically as needed (flaky/itchy scalp). Up to once per week (Patient not taking: Reported on 06/04/2020)   levETIRAcetam  (KEPPRA) 500 MG tablet Take 500 mg by mouth. 500 mg bid for one month   metFORMIN (GLUCOPHAGE) 500 MG tablet Take 1 tablet (500 mg total) by mouth daily with breakfast.   Multiple Vitamin (MULTIVITAMIN WITH MINERALS) TABS tablet Take 1 tablet by mouth daily at 2 PM.   nitrofurantoin, macrocrystal-monohydrate, (MACROBID) 100 MG capsule Take 1 capsule (100 mg total) by mouth every 12 (twelve) hours.   nitrofurantoin, macrocrystal-monohydrate, (MACROBID) 100 MG capsule Take 1 capsule (100 mg total) by mouth every 12 (twelve) hours. Start this medication 3 days before your scheduled cystoscopy which is on a Friday.  This means that you should start this medication on Tuesday and then complete the course for a total of 5 days.   nystatin cream (MYCOSTATIN) APPLY CREAM TOPICALLY TWICE DAILY UNTIL REDNESS IS CLEAR   Phenazopyridine HCl (AZO DINE PO) Take by mouth.   No facility-administered medications prior to visit.    Review of Systems      Objective    There were no vitals taken for this visit. BP Readings from Last 3 Encounters:  06/04/20 138/84  05/21/20 (!) 142/83  04/27/20 133/84   Wt Readings from Last 3 Encounters:  06/04/20 215 lb (97.5 kg)  05/21/20 216 lb (98 kg)  04/27/20 217 lb (98.4 kg)       Physical Exam Vitals reviewed.  Constitutional:      Appearance: She is obese.  HENT:     Right Ear: External ear normal.     Left Ear: External ear normal.  Eyes:     General: No scleral icterus.    Conjunctiva/sclera: Conjunctivae normal.  Cardiovascular:     Rate and Rhythm: Normal rate and regular rhythm.     Heart sounds: Normal heart sounds.  Pulmonary:     Breath sounds: Normal breath sounds.  Chest:  Breasts:    Right: Normal.     Left: Absent.  Abdominal:     Palpations: Abdomen is soft.  Musculoskeletal:     Right lower leg: Edema present.     Left lower leg: Edema present.     Comments: 1+ ankle/pedal edema. Chest mild tenderness along the left knee and  the lateral joint line. She has decreased range of motion in her shoulder with some limitations with abduction.  No point tenderness.  Skin:    General: Skin is warm and dry.  Neurological:     Mental Status: She is alert and oriented to person, place, and time.  Psychiatric:        Mood and Affect: Mood normal.        Behavior: Behavior normal.        Thought Content: Thought content normal.        Judgment: Judgment normal.      Results for orders  placed or performed in visit on 08/25/20  POCT glycosylated hemoglobin (Hb A1C)  Result Value Ref Range   Hemoglobin A1C 6.2 (A) 4.0 - 5.6 %   HbA1c POC (<> result, manual entry)     HbA1c, POC (prediabetic range)     HbA1c, POC (controlled diabetic range)      Assessment & Plan    1. Type 2 diabetes mellitus without complication, without long-term current use of insulin (HCC) Goal A1c less than 7.5 - POCT glycosylated hemoglobin (Hb A1C)  2. Tendinitis of left shoulder Try a short prednisone taper.  I think referral back to orthopedics is appropriate with shoulder and knee problems. - predniSONE (STERAPRED UNI-PAK 21 TAB) 10 MG (21) TBPK tablet; Taper as directed.  Dispense: 21 tablet; Refill: 0  3. Knee pain, unspecified chronicity, unspecified laterality Osteoarthritis almost certainly the etiology  4. Hypercholesterolemia On atorvastatin.prov  - TSH - Lipid panel  5. Essential hypertension Good control - CBC with Differential/Platelet - Comprehensive metabolic panel  6. Loose stools Try probiotic daily with referral back to GI pending.    No follow-ups on file.      I, [name], MD, have reviewed all documentation for this visit. The documentation on 09/01/20 for the exam, diagnosis, procedures, and orders are all accurate and complete.    Liyah Higham Cranford Mon, MD  [facility] [phone] (phone) [phone] (fax)  [place]

## 2020-08-25 NOTE — Patient Instructions (Signed)
Use Topical Voltaren gel. You can get this over the counter.   Also use Probiotic daily.

## 2020-08-26 LAB — CBC WITH DIFFERENTIAL/PLATELET
Basophils Absolute: 0 10*3/uL (ref 0.0–0.2)
Basos: 0 %
EOS (ABSOLUTE): 0.1 10*3/uL (ref 0.0–0.4)
Eos: 1 %
Hematocrit: 44.3 % (ref 34.0–46.6)
Hemoglobin: 14.7 g/dL (ref 11.1–15.9)
Immature Grans (Abs): 0 10*3/uL (ref 0.0–0.1)
Immature Granulocytes: 0 %
Lymphocytes Absolute: 2.1 10*3/uL (ref 0.7–3.1)
Lymphs: 23 %
MCH: 30.6 pg (ref 26.6–33.0)
MCHC: 33.2 g/dL (ref 31.5–35.7)
MCV: 92 fL (ref 79–97)
Monocytes Absolute: 0.9 10*3/uL (ref 0.1–0.9)
Monocytes: 10 %
Neutrophils Absolute: 6.2 10*3/uL (ref 1.4–7.0)
Neutrophils: 66 %
Platelets: 294 10*3/uL (ref 150–450)
RBC: 4.8 x10E6/uL (ref 3.77–5.28)
RDW: 14.3 % (ref 11.7–15.4)
WBC: 9.5 10*3/uL (ref 3.4–10.8)

## 2020-08-26 LAB — COMPREHENSIVE METABOLIC PANEL
ALT: 14 IU/L (ref 0–32)
AST: 18 IU/L (ref 0–40)
Albumin/Globulin Ratio: 2 (ref 1.2–2.2)
Albumin: 4.3 g/dL (ref 3.6–4.6)
Alkaline Phosphatase: 111 IU/L (ref 44–121)
BUN/Creatinine Ratio: 15 (ref 12–28)
BUN: 10 mg/dL (ref 8–27)
Bilirubin Total: 0.6 mg/dL (ref 0.0–1.2)
CO2: 23 mmol/L (ref 20–29)
Calcium: 9.8 mg/dL (ref 8.7–10.3)
Chloride: 105 mmol/L (ref 96–106)
Creatinine, Ser: 0.66 mg/dL (ref 0.57–1.00)
Globulin, Total: 2.1 g/dL (ref 1.5–4.5)
Glucose: 96 mg/dL (ref 65–99)
Potassium: 4.2 mmol/L (ref 3.5–5.2)
Sodium: 143 mmol/L (ref 134–144)
Total Protein: 6.4 g/dL (ref 6.0–8.5)
eGFR: 87 mL/min/{1.73_m2} (ref >59)

## 2020-08-26 LAB — TSH: TSH: 2.09 u[IU]/mL (ref 0.450–4.500)

## 2020-08-26 LAB — LIPID PANEL
Chol/HDL Ratio: 4 ratio (ref 0.0–4.4)
Cholesterol, Total: 183 mg/dL (ref 100–199)
HDL: 46 mg/dL (ref >39)
LDL Chol Calc (NIH): 93 mg/dL (ref 0–99)
Triglycerides: 261 mg/dL — ABNORMAL HIGH (ref 0–149)
VLDL Cholesterol Cal: 44 mg/dL — ABNORMAL HIGH (ref 5–40)

## 2020-09-03 ENCOUNTER — Telehealth: Payer: Self-pay

## 2020-09-03 ENCOUNTER — Ambulatory Visit (INDEPENDENT_AMBULATORY_CARE_PROVIDER_SITE_OTHER): Payer: Medicare Other | Admitting: Urology

## 2020-09-03 ENCOUNTER — Other Ambulatory Visit
Admission: RE | Admit: 2020-09-03 | Discharge: 2020-09-03 | Disposition: A | Discharge status: Home / Self Care | Payer: Medicare Other | Attending: Urology | Admitting: Urology

## 2020-09-03 ENCOUNTER — Other Ambulatory Visit: Payer: Self-pay

## 2020-09-03 VITALS — BP 156/81 | HR 77 | Ht 63.0 in | Wt 209.0 lb

## 2020-09-03 DIAGNOSIS — C662 Malignant neoplasm of left ureter: Secondary | ICD-10-CM

## 2020-09-03 DIAGNOSIS — N390 Urinary tract infection, site not specified: Secondary | ICD-10-CM | POA: Insufficient documentation

## 2020-09-03 LAB — URINALYSIS, COMPLETE (UACMP) WITH MICROSCOPIC
Bilirubin Urine: NEGATIVE
Glucose, UA: NEGATIVE mg/dL
Ketones, ur: NEGATIVE mg/dL
Nitrite: NEGATIVE
Protein, ur: NEGATIVE mg/dL
RBC / HPF: >50 RBC/hpf (ref 0–5)
Specific Gravity, Urine: 1.02 (ref 1.005–1.030)
pH: 5.5 (ref 5.0–8.0)

## 2020-09-03 MED ORDER — NITROFURANTOIN MONOHYD MACRO 100 MG PO CAPS
100.0000 mg | ORAL_CAPSULE | Freq: Two times a day (BID) | ORAL | 0 refills | Status: DC
Start: 2020-09-03 — End: 2020-12-03

## 2020-09-03 NOTE — Telephone Encounter (Signed)
Incoming call from pt who questions what time she needs to arrive today for her Cysto appointment. Pt also questions if she can use her Hydrocortisone ointment and nystatin ointment today and if he husband may attend her appointment. Advised pt that she will need to arrive early in order to visit lab for UA, also advised that should use her ointments today after her cysto appointment, lastly advised that her husband may attend her appt. Pt voiced understanding.

## 2020-09-03 NOTE — Addendum Note (Signed)
Addended by: Tommy Rainwater on: 09/03/2020 02:33 PM   Modules accepted: Orders

## 2020-09-03 NOTE — Progress Notes (Signed)
   09/03/20  CC:  Chief Complaint  Patient presents with   Cysto     HPI: Elizabeth Trujillo is a 85 y.o. 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   CT from 07/02/19 revealed no findings of recurrent malignancy and suspected scarring in the left kidney accounting for the cortical thinning and hypodensities in the mid kidney and lower pole, substantially worsened from 08/01/2018.   She underwent ablative therapy and BCG in 2019. She returned to the operating room on 12/20 for surveillance including a cystoscopy and ureteroscopy w/ NED at that time. Personally discussed w/ radiology regarding her distal ureter malignancy.    Cystoscopy on 07/25/2019 was unremarkable.  Most recent cross-sectional imaging in the form of CT urogram on 07/02/2019 with suspected scarring of the left distal ureter but no obvious filling defects or tumor recurrence.  She returned to the operating room on 11/2019 for surveillance ureteroscopy.  Bilateral  retrograde indicated some slight narrowing of the left distal ureter with prompt drainage, ureteroscopy was unremarkable.  She underwent interval with CT urogram on 05/19/2020 which was unremarkable.  Urine cytology was negative.  She reports that she continues to have issues with labial excoriation and irritation.  This is ongoing today.  She is using nystatin cream again.  She has taken periprocedural antibiotics, Macrobid for the last 3 days in anticipation of the procedure today  Blood pressure (!) 159/83, pulse 72, height 5\' 3"  (1.6 m), weight 213 lb (96.6 kg). NED. A&Ox3.   No respiratory distress   Abd soft, NT, ND Irritated excoriated external genitalia, normal urethral meatus  She was given a Cipro 500 mg x 1 today.  Cystoscopy Procedure Note  Patient identification was confirmed, informed consent was obtained, and patient  was prepped using Betadine solution.  Lidocaine jelly was administered per urethral meatus.    Procedure: - Flexible cystoscope introduced, without any difficulty.   - Thorough search of the bladder revealed:    normal urethral meatus    normal urothelium with stable trigonitis and a few stellate scars, otherwise unremarkable    no stones    no ulcers     no tumors    no urethral polyps    no trabeculation  - Ureteral orifices were normal in position and appearance.  Post-Procedure: - Patient tolerated the procedure well  Assessment/ Plan:  1. History of bladder cancer/left distal ureter malignancy  NED today, will repeat urine cytology  Plan for cystoscopy again in 3 months with cytology  Given her history of recurrent infections and frequent cancellation of cystoscopy is, we will have her go ahead and start Macrobid 3 days before her next cystoscopic procedure for a total of 5-day course.  We will plan for a CT urogram prior to her next follow-up visit for monitoring of her upper tracts   Hollice Espy, MD

## 2020-09-03 NOTE — Patient Instructions (Signed)
Start the Nitro medication 3 days before your scheduled cystoscopy which is on a Friday. This means that you should start this medication on Tuesday and then complete the course for a total of 5 days.

## 2020-09-19 ENCOUNTER — Other Ambulatory Visit: Payer: Self-pay | Admitting: Family Medicine

## 2020-09-19 NOTE — Telephone Encounter (Signed)
Future visit in 4 months 

## 2020-09-20 ENCOUNTER — Encounter: Payer: Self-pay | Admitting: *Deleted

## 2020-09-21 DIAGNOSIS — M1712 Unilateral primary osteoarthritis, left knee: Secondary | ICD-10-CM | POA: Diagnosis not present

## 2020-10-27 ENCOUNTER — Ambulatory Visit: Payer: Self-pay | Admitting: *Deleted

## 2020-10-27 ENCOUNTER — Telehealth: Payer: Self-pay

## 2020-10-27 NOTE — Telephone Encounter (Signed)
Copied from [place] [id number]. Topic: General - Other >> Oct 27, 2020 11:15 AM Alanda Slim E wrote: Reason for CRM: Pt wants to speak with nurse about her diabetes and cholesterol levels from her last blood work order/ please advise

## 2020-10-27 NOTE — Telephone Encounter (Signed)
Left message to call back. Unable to leave VM. CRM created.

## 2020-10-27 NOTE — Telephone Encounter (Signed)
Pt has a question about taking probiotics and the amount she is suppose to take / pt asked to speak with a nurse/ please advise  Called patient to review questions about taking probiotics. No answer, voicemail box is full, unable to leave message.

## 2020-10-27 NOTE — Telephone Encounter (Signed)
Pt advised.   Thanks,   -Guinevere Stephenson  

## 2020-10-27 NOTE — Telephone Encounter (Signed)
Patient called and says she is taking OTC probiotics. She says the one she was taking is the [employer] brand with 30 billion and the one she has now has 4 billion. She says at her last OV Dr. Rosanna Randy told her to increase to 2 pills a day. She says she wasn't sure what dosage he wanted her to take. She says if he would let her know what dosage he wants her to take of the probiotic OTC, she doesn't want a prescription. She says she doesn't know when she will have diarrhea, but at the moment no diarrhea. I advised I will send this to Dr. Rosanna Randy and someone will call back with his recommendation, patient verbalized understanding.  Summary: medication amount and strength question   Pt has a question about taking probiotics and the amount she is suppose to take / pt asked to speak with a nurse/ please advise       Reason for Disposition  [1] Caller has NON-URGENT medicine question about med that PCP prescribed AND [2] triager unable to answer question  Answer Assessment - Initial Assessment Questions 1. NAME of MEDICATION: "What medicine are you calling about?"     Probiotic 2. QUESTION: "What is your question?" (e.g., double dose of medicine, side effect)     How much should I be taking 3. PRESCRIBING HCP: "Who prescribed it?" Reason: if prescribed by specialist, call should be referred to that group.     N/A, it's OTC 4. SYMPTOMS: "Do you have any symptoms?"     No 5. SEVERITY: If symptoms are present, ask "Are they mild, moderate or severe?"     No 6. PREGNANCY:  "Is there any chance that you are pregnant?" "When was your last menstrual period?"     No  Protocols used: Medication Question Call-A-AH

## 2020-11-04 DIAGNOSIS — M1712 Unilateral primary osteoarthritis, left knee: Secondary | ICD-10-CM | POA: Diagnosis not present

## 2020-11-07 ENCOUNTER — Other Ambulatory Visit: Payer: Self-pay | Admitting: Family Medicine

## 2020-11-07 DIAGNOSIS — E119 Type 2 diabetes mellitus without complications: Secondary | ICD-10-CM

## 2020-11-07 NOTE — Telephone Encounter (Signed)
Requested Prescriptions  Pending Prescriptions Disp Refills  . metFORMIN (GLUCOPHAGE) 500 MG tablet [Pharmacy Med Name: metFORMIN HCl 500 MG Oral Tablet] 90 tablet 0    Sig: Take 1 tablet by mouth once daily with breakfast     Endocrinology:  Diabetes - Biguanides Passed - 11/07/2020  2:23 PM      Passed - Cr in normal range and within 360 days    Creatinine, Ser  Date Value Ref Range Status  08/25/2020 0.66 0.57 - 1.00 mg/dL Final         Passed - HBA1C is between 0 and 7.9 and within 180 days    Hemoglobin A1C  Date Value Ref Range Status  08/25/2020 6.2 (A) 4.0 - 5.6 % Final   Hgb A1c MFr Bld  Date Value Ref Range Status  09/29/2019 6.3 (H) 4.8 - 5.6 % Final    Comment:             Prediabetes: 5.7 - 6.4          Diabetes: >6.4          Glycemic control for adults with diabetes: <7.0          Passed - eGFR in normal range and within 360 days    GFR calc Af Amer  Date Value Ref Range Status  04/27/2020 96 >59 mL/min/1.73 Final    Comment:    **In accordance with recommendations from the NKF-ASN Task force,**   [facility] is in the process of updating its eGFR calculation to the   2021 CKD-EPI creatinine equation that estimates kidney function   without a race variable.    GFR calc non Af Amer  Date Value Ref Range Status  04/27/2020 83 >59 mL/min/1.73 Final   eGFR  Date Value Ref Range Status  08/25/2020 87 >59 mL/min/1.73 Final         Passed - Valid encounter within last 6 months    Recent Outpatient Visits          2 months ago Type 2 diabetes mellitus without complication, without long-term current use of insulin [facility])   [facility] [name]., MD   6 months ago Hypercholesterolemia   [facility] [name]., MD   1 year ago Hemorrhoids, unspecified hemorrhoid type   [facility] [name]., MD   1 year ago Essential hypertension   [facility] [name]., MD   2 years ago Urothelial carcinoma [facility])   [facility] [name]., MD      Future Appointments            In 2 months [name]., MD [facility] [place]

## 2020-11-18 DIAGNOSIS — M25662 Stiffness of left knee, not elsewhere classified: Secondary | ICD-10-CM | POA: Diagnosis not present

## 2020-11-18 DIAGNOSIS — M25561 Pain in right knee: Secondary | ICD-10-CM | POA: Diagnosis not present

## 2020-11-18 DIAGNOSIS — M1712 Unilateral primary osteoarthritis, left knee: Secondary | ICD-10-CM | POA: Diagnosis not present

## 2020-11-18 DIAGNOSIS — M25562 Pain in left knee: Secondary | ICD-10-CM | POA: Diagnosis not present

## 2020-12-01 ENCOUNTER — Ambulatory Visit
Admission: RE | Admit: 2020-12-01 | Discharge: 2020-12-01 | Disposition: A | Discharge status: Home / Self Care | Payer: Medicare Other | Source: Ambulatory Visit | Attending: Urology | Admitting: Urology

## 2020-12-01 DIAGNOSIS — K5730 Diverticulosis of large intestine without perforation or abscess without bleeding: Secondary | ICD-10-CM | POA: Diagnosis not present

## 2020-12-01 DIAGNOSIS — K449 Diaphragmatic hernia without obstruction or gangrene: Secondary | ICD-10-CM | POA: Diagnosis not present

## 2020-12-01 DIAGNOSIS — C662 Malignant neoplasm of left ureter: Secondary | ICD-10-CM | POA: Insufficient documentation

## 2020-12-01 DIAGNOSIS — N281 Cyst of kidney, acquired: Secondary | ICD-10-CM | POA: Diagnosis not present

## 2020-12-01 LAB — POCT I-STAT CREATININE: Creatinine, Ser: 0.6 mg/dL (ref 0.44–1.00)

## 2020-12-01 MED ORDER — IOHEXOL 350 MG/ML SOLN
80.0000 mL | Freq: Once | INTRAVENOUS | Status: AC | PRN
  Administered 2020-12-01: 80 mL via INTRAVENOUS

## 2020-12-02 ENCOUNTER — Other Ambulatory Visit: Payer: Self-pay | Admitting: *Deleted

## 2020-12-02 DIAGNOSIS — N390 Urinary tract infection, site not specified: Secondary | ICD-10-CM

## 2020-12-02 DIAGNOSIS — M25662 Stiffness of left knee, not elsewhere classified: Secondary | ICD-10-CM | POA: Diagnosis not present

## 2020-12-02 DIAGNOSIS — M1712 Unilateral primary osteoarthritis, left knee: Secondary | ICD-10-CM | POA: Diagnosis not present

## 2020-12-02 DIAGNOSIS — M25562 Pain in left knee: Secondary | ICD-10-CM | POA: Diagnosis not present

## 2020-12-03 ENCOUNTER — Ambulatory Visit (INDEPENDENT_AMBULATORY_CARE_PROVIDER_SITE_OTHER): Payer: Medicare Other | Admitting: Urology

## 2020-12-03 ENCOUNTER — Other Ambulatory Visit: Payer: Self-pay

## 2020-12-03 ENCOUNTER — Other Ambulatory Visit
Admission: RE | Admit: 2020-12-03 | Discharge: 2020-12-03 | Disposition: A | Discharge status: Home / Self Care | Payer: Medicare Other | Attending: Urology | Admitting: Urology

## 2020-12-03 ENCOUNTER — Encounter: Payer: Self-pay | Admitting: Urology

## 2020-12-03 VITALS — BP 123/81 | HR 73 | Ht 63.0 in | Wt 204.0 lb

## 2020-12-03 DIAGNOSIS — C662 Malignant neoplasm of left ureter: Secondary | ICD-10-CM

## 2020-12-03 DIAGNOSIS — N390 Urinary tract infection, site not specified: Secondary | ICD-10-CM | POA: Diagnosis not present

## 2020-12-03 DIAGNOSIS — C679 Malignant neoplasm of bladder, unspecified: Secondary | ICD-10-CM

## 2020-12-03 LAB — URINALYSIS, COMPLETE (UACMP) WITH MICROSCOPIC
Bilirubin Urine: NEGATIVE
Glucose, UA: NEGATIVE mg/dL
Ketones, ur: NEGATIVE mg/dL
Nitrite: NEGATIVE
Protein, ur: NEGATIVE mg/dL
Specific Gravity, Urine: 1.02 (ref 1.005–1.030)
pH: 5.5 (ref 5.0–8.0)

## 2020-12-03 MED ORDER — NITROFURANTOIN MONOHYD MACRO 100 MG PO CAPS: 100.0000 mg | ORAL_CAPSULE | Freq: Two times a day (BID) | ORAL | 0 refills | Status: DC

## 2020-12-03 NOTE — Progress Notes (Signed)
   12/03/20  CC:  cysto   HPI: DIANEY SUCHY is a 85 y.o. 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   She underwent ablative therapy and BCG in 2019. She returned to the operating room on 12/20 for surveillance including a cystoscopy and ureteroscopy w/ NED at that time. Personally discussed w/ radiology regarding her distal ureter malignancy.   She returned to the operating room on 11/2019 for surveillance ureteroscopy.  Bilateral  retrograde indicated some slight narrowing of the left distal ureter with prompt drainage, ureteroscopy was unremarkable.  CT urogram performed just yesterday reveals stable changes in her kidney and ureter.  There is no changes.  There is no filling defects or evidence of metastatic disease.  She has taken periprocedural antibiotics, Macrobid for the last 3 days in anticipation of the procedure today  Blood pressure (!) 159/83, pulse 72, height 5\' 3"  (1.6 m), weight 213 lb (96.6 kg). NED. A&Ox3.   No respiratory distress   Abd soft, NT, ND Irritated excoriated external genitalia, normal urethral meatus  She was given a Cipro 500 mg x 1 today.  Cystoscopy Procedure Note  Patient identification was confirmed, informed consent was obtained, and patient was prepped using Betadine solution.  Lidocaine jelly was administered per urethral meatus.    Procedure: - Flexible cystoscope introduced, without any difficulty.   - Thorough search of the bladder revealed:    normal urethral meatus    normal urothelium with stable trigonitis and a few stellate scars, otherwise unremarkable    no stones    no ulcers     no tumors    no urethral polyps    no trabeculation  - Ureteral orifices were normal in position and appearance.  Post-Procedure: - Patient tolerated the procedure well  Assessment/ Plan:  1. History of bladder  cancer/left distal ureter malignancy  NED today, will repeat urine cytology  Plan for cystoscopy again in 3 months with cytology  Given her history of recurrent infections and frequent cancellation of cystoscopy is, we will have her go ahead and start Macrobid 3 days before her next cystoscopic procedure for a total of 5-day course.  Repeat imaging in 6 moths   Hollice Espy, MD

## 2020-12-03 NOTE — Patient Instructions (Signed)
Take 1 capsule (100 mg total) by mouth every 12 (twelve) hours. Start this medication 3 days before your scheduled cystoscopy which is on a Friday.  This means that you should start this medication on Tuesday and then complete the course for a total of 5 days.

## 2020-12-07 LAB — CYTOLOGY - NON PAP

## 2020-12-08 DIAGNOSIS — Z23 Encounter for immunization: Secondary | ICD-10-CM | POA: Diagnosis not present

## 2020-12-16 DIAGNOSIS — Z23 Encounter for immunization: Secondary | ICD-10-CM | POA: Diagnosis not present

## 2020-12-21 ENCOUNTER — Other Ambulatory Visit: Payer: Self-pay | Admitting: Family Medicine

## 2020-12-23 DIAGNOSIS — M25562 Pain in left knee: Secondary | ICD-10-CM | POA: Diagnosis not present

## 2020-12-23 DIAGNOSIS — M1712 Unilateral primary osteoarthritis, left knee: Secondary | ICD-10-CM | POA: Diagnosis not present

## 2020-12-23 DIAGNOSIS — M25662 Stiffness of left knee, not elsewhere classified: Secondary | ICD-10-CM | POA: Diagnosis not present

## 2021-01-05 ENCOUNTER — Telehealth: Payer: Self-pay

## 2021-01-05 DIAGNOSIS — M1712 Unilateral primary osteoarthritis, left knee: Secondary | ICD-10-CM | POA: Diagnosis not present

## 2021-01-05 DIAGNOSIS — Z538 Procedure and treatment not carried out for other reasons: Secondary | ICD-10-CM | POA: Diagnosis not present

## 2021-01-05 NOTE — Telephone Encounter (Signed)
Patient called stating that she has seen some blood in her urine since yesterday, denies any other urinary symptoms. She was scheduled to have knee surgery today and it was cancelled due to infection on leg and was started on abx. Surgeon instructed patient to contact our office due to blood in urine. Per Dr. Erlene Quan this is a reoccurring issue due to history of UCC. Cysto done last month was negative and cytology& CTscan were normal. Patient should keep follow up for cysto in 3 months. Patient verbalized understanding

## 2021-01-07 DIAGNOSIS — L03116 Cellulitis of left lower limb: Secondary | ICD-10-CM | POA: Diagnosis not present

## 2021-01-11 DIAGNOSIS — L03119 Cellulitis of unspecified part of limb: Secondary | ICD-10-CM | POA: Diagnosis not present

## 2021-01-18 ENCOUNTER — Ambulatory Visit: Payer: Self-pay | Admitting: Family Medicine

## 2021-01-18 ENCOUNTER — Telehealth: Payer: Self-pay

## 2021-01-18 NOTE — Telephone Encounter (Signed)
Copied from [place] [id number]. Topic: Appointment Scheduling - Scheduling Inquiry for Clinic >> Jan 18, 2021  2:25 PM Elizabeth Trujillo A wrote: Reason for CRM: The patient would like to be seen in office for a surgical clearance   The patient will be having a total replacement of their left knee   The patient would like to be seen sooner rather than later due to the nature of their concern and their need for the replacement   The patient's procedure has been previously rescheduled due to concerns about bruising   Please contact further

## 2021-01-25 ENCOUNTER — Telehealth: Payer: Self-pay

## 2021-01-25 DIAGNOSIS — G939 Disorder of brain, unspecified: Secondary | ICD-10-CM | POA: Diagnosis not present

## 2021-01-25 DIAGNOSIS — G3184 Mild cognitive impairment, so stated: Secondary | ICD-10-CM | POA: Diagnosis not present

## 2021-01-25 DIAGNOSIS — R569 Unspecified convulsions: Secondary | ICD-10-CM | POA: Diagnosis not present

## 2021-01-25 NOTE — Telephone Encounter (Signed)
Lmtcb. PEC please question or concern with medication.

## 2021-01-25 NOTE — Telephone Encounter (Signed)
Copied from [place] [phone]. Topic: Quick Communication - Rx Refill/Question >> Jan 24, 2021  3:22 PM Pawlus, Brayton Layman A wrote: Reason for CRM: Pt requested to speak with Dr Rosanna Randy regarding her medication, please advise.

## 2021-01-31 NOTE — Telephone Encounter (Signed)
Patient reports having diarrhea on and off x several months. Patient wanted to know if medication for cholesterol is causing diarrhea. Advised patient that most likely medication is not causing diarrhea since she has been taking for several years. Patient reports she also reached out to pharmacy and asked them about side effects on medication. Patient was advised that a very small percent of patient suffer from side effects. Patient does have an appt with Dr. Caryn Section for pre-op, however does want to address with Dr. Rosanna Randy. Patient also scheduled to be seen at GI in December, advised to call if she needs to be seen sooner with them. Patient went ahead and scheduled fu with Dr. Rosanna Randy in February.

## 2021-02-01 ENCOUNTER — Telehealth: Payer: Self-pay | Admitting: Urology

## 2021-02-01 NOTE — Progress Notes (Signed)
02/02/2021 5:32 PM   Elizabeth Trujillo 1935-06-23 [id number]  Referring provider: [name]., MD [address] [place],  [place] [address]  Chief Complaint  Patient presents with   Hematuria   Urological history: 1. 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 She returned to the operating room on 12/20 for surveillance including a cystoscopy and ureteroscopy w/ NED at that time. Personally discussed w/ radiology regarding her distal ureter malignancy.  -operating room on 11/2019 for surveillance ureteroscopy.  Bilateral  retrograde indicated some slight narrowing of the left distal ureter with prompt drainage, ureteroscopy was unremarkable -cysto 11/2020 - NED -urine cytology 11/2020 - negative -cysto scheduled for 02/25/2021  2. High risk hematuria -non-smoker -hx of bladder cancer -CTU 11/2020 - negative for recurrent or metastatic disease -cysto 11/2020 - NED -urine cytology 11/2020 - negative -reports of gross heme -UA 3-10 RBC's   HPI: Elizabeth Trujillo is a 85 y.o. female who presents today for gross heme.  UA > 30 WBC's, 3-10 RBC's, > 10 squames and many bacteria.    She has been having gross heme off and on for the last 9 days.  Patient denies any modifying or aggravating factors.  Patient denies any dysuria or suprapubic/flank pain.  Patient denies any fevers, chills, nausea or vomiting.      PMH: Past Medical History:  Diagnosis Date   Arthritis    Breast cancer ([place]) 1986   left mastectomy   carcinoma in situ of urinary bladder 2019   BCG tx per pt   Collagen vascular disease (HCC)    RA    Elevated triglycerides with high cholesterol    Family history of bladder cancer    Family history of breast cancer    Family history of lung cancer    GERD (gastroesophageal reflux disease)    history of reflux   History of cancer of  ureter    Hypercholesterolemia    Hypertension    history of    Primary ureteral papillary carcinoma, left ([place]) 2019   Surgical resection and BCG  tx per pt   Seizure ([place])    last 08-2018   Stroke [facility])    TIA    Surgical History: Past Surgical History:  Procedure Laterality Date   ABDOMINAL HYSTERECTOMY  [id number]   TAH/BSO   BIOPSY Left 11/11/2018   Procedure: BIOPSY;  Surgeon: Hollice Espy, MD;  Location: [facility] ORS;  Service: Urology;  Laterality: Left;   BREAST BIOPSY Right 01/23/2013   [place] bx/clip-neg   BREAST SURGERY Left 07/1984   Mastectomy   callus removed from left toe     CATARACT EXTRACTION W/ INTRAOCULAR LENS IMPLANT Left 2015   CYSTOSCOPY W/ RETROGRADES Left 02/24/2019   Procedure: CYSTOSCOPY WITH RETROGRADE PYELOGRAM;  Surgeon: Hollice Espy, MD;  Location: [facility];  Service: Urology;  Laterality: Left;   CYSTOSCOPY WITH BIOPSY N/A 02/24/2019   Procedure: CYSTOSCOPY WITH Bladder BIOPSY;  Surgeon: Hollice Espy, MD;  Location: [facility];  Service: Urology;  Laterality: N/A;   CYSTOSCOPY WITH FULGERATION N/A 02/24/2019   Procedure: CYSTOSCOPY WITH FULGERATION;  Surgeon: Hollice Espy, MD;  Location: [facility];  Service: Urology;  Laterality: N/A;   CYSTOSCOPY WITH RETROGRADE PYELOGRAM, URETEROSCOPY AND STENT PLACEMENT Left 11/24/2019   Procedure: CYSTOSCOPY WITH RETROGRADE PYELOGRAM, URETEROSCOPY;  Surgeon: Hollice Espy, MD;  Location: [facility];  Service: Urology;  Laterality: Left;   CYSTOSCOPY WITH URETEROSCOPY AND STENT PLACEMENT Left 10/14/2018   Procedure: CYSTOSCOPY WITH URETEROSCOPY AND STENT PLACEMENT;  Surgeon: Hollice Espy, MD;  Location: [facility];  Service: Urology;  Laterality: Left;   CYSTOSCOPY WITH URETEROSCOPY AND STENT PLACEMENT Left 02/24/2019   Procedure: CYSTOSCOPY WITH URETEROSCOPY AND STENT Exchange;  Surgeon: Hollice Espy, MD;  Location: [facility];  Service: Urology;  Laterality: Left;   CYSTOSCOPY/URETEROSCOPY/HOLMIUM LASER/STENT  PLACEMENT Left 11/11/2018   Procedure: CYSTOSCOPY/URETEROSCOPY//STENT EXCHANGE;  Surgeon: Hollice Espy, MD;  Location: [facility];  Service: Urology;  Laterality: Left;   EYE SURGERY Left 2013   FOOT SURGERY Left 04/2013   HOLMIUM LASER APPLICATION Left 10/17/7670   Procedure: HOLMIUM LASER APPLICATION;  Surgeon: Hollice Espy, MD;  Location: [facility];  Service: Urology;  Laterality: Left;   JOINT REPLACEMENT     KNEE ARTHROPLASTY Right 11/03/2015   Procedure: COMPUTER ASSISTED TOTAL KNEE ARTHROPLASTY;  Surgeon: Dereck Leep, MD;  Location: [facility];  Service: Orthopedics;  Laterality: Right;   MASTECTOMY Left 1986   positive   URETERAL BIOPSY Left 10/14/2018   Procedure: URETERAL BIOPSY;  Surgeon: Hollice Espy, MD;  Location: [facility];  Service: Urology;  Laterality: Left;   URETERAL BIOPSY Left 02/24/2019   Procedure: URETERAL BIOPSY;  Surgeon: Hollice Espy, MD;  Location: [facility];  Service: Urology;  Laterality: Left;    Home Medications:  Allergies as of 02/02/2021       Reactions   Penicillin V Potassium Hives   Has patient had a PCN reaction causing immediate rash, facial/tongue/throat swelling, SOB or lightheadedness with hypotension: no Has patient had a PCN reaction causing severe rash involving mucus membranes or skin necrosis: {no Has patient had a PCN reaction that required hospitalization no Has patient had a PCN reaction occurring within the last 10 years: no If all of the above answers are "NO", then may proceed with Cephalosporin use.   Sulfa Antibiotics Hives        Medication List        Accurate as of February 02, 2021 11:59 PM. If you have any questions, ask your nurse or doctor.          acetaminophen 500 MG tablet Commonly known as: TYLENOL Take 1,000 mg by mouth every 6 (six) hours as needed for moderate pain (pain.).   aspirin EC 81 MG tablet Take 81 mg by mouth at bedtime.   atorvastatin 40 MG tablet Commonly known as: LIPITOR Take 1  tablet by mouth once daily   AZO DINE PO Take by mouth.   CALCIUM+D3 PO Take 1 tablet by mouth daily.   cholecalciferol 25 MCG (1000 UNIT) tablet Commonly known as: VITAMIN D Take 1,000 Units by mouth at bedtime.   hydrocortisone 2.5 % ointment Apply topically.   ketoconazole 2 % shampoo Commonly known as: NIZORAL Apply 1 application topically as needed (flaky/itchy scalp). Up to once per week   levETIRAcetam 500 MG tablet Commonly known as: KEPPRA Take by mouth.   metFORMIN 500 MG tablet Commonly known as: GLUCOPHAGE Take 1 tablet by mouth once daily with breakfast   multivitamin with minerals Tabs tablet Take 1 tablet by mouth daily at 2 PM.   nitrofurantoin (macrocrystal-monohydrate) 100 MG capsule Commonly known as: Macrobid Take 1 capsule (100 mg total) by mouth 2 (two) times daily. What changed:  when to take this additional instructions Changed by: Lyra Alaimo, PA-C   nystatin cream Commonly known as: MYCOSTATIN APPLY CREAM TOPICALLY TWICE DAILY UNTIL  REDNESS IS CLEAR        Allergies:  Allergies  Allergen Reactions   Penicillin V Potassium Hives    Has patient had a PCN reaction causing immediate rash, facial/tongue/throat swelling, SOB or lightheadedness with hypotension: no Has patient had a PCN reaction causing severe rash involving mucus membranes or skin necrosis: {no Has patient had a PCN reaction that required hospitalization no Has patient had a PCN reaction occurring within the last 10 years: no If all of the above answers are "NO", then may proceed with Cephalosporin use.   Sulfa Antibiotics Hives    Family History: Family History  Problem Relation Age of Onset   Glaucoma Mother    Kidney disease Mother    Arthritis Mother    Stroke Mother    Breast cancer Mother 24   Bladder Cancer Mother 52   Heart disease Father    Cancer Brother 24       lung cancer, smoker   Breast cancer Cousin        3 mat cousins dx late 60s    Breast cancer Cousin        1 mat cousin dx 17s-60s    Social History:  reports that she has never smoked. She has never used smokeless tobacco. She reports that she does not drink alcohol and does not use drugs.  ROS: Pertinent ROS in HPI  Physical Exam: BP (!) 168/89   Pulse 67   Ht 5\' 3"  (1.6 m)   Wt 201 lb (91.2 kg)   BMI 35.61 kg/m   Constitutional:  Well nourished. Alert and oriented, No acute distress. HEENT: [place] AT, mask in place.  Trachea midline Cardiovascular: No clubbing, cyanosis, or edema. Respiratory: Normal respiratory effort, no increased work of breathing. Neurologic: Grossly intact, no focal deficits, moving all 4 extremities. Psychiatric: Normal mood and affect.    Laboratory Data: Lab Results  Component Value Date   WBC 9.5 08/25/2020   HGB 14.7 08/25/2020   HCT 44.3 08/25/2020   MCV 92 08/25/2020   PLT 294 08/25/2020    Lab Results  Component Value Date   CREATININE 0.60 12/01/2020    Lab Results  Component Value Date   HGBA1C 6.2 (A) 08/25/2020    Lab Results  Component Value Date   TSH 2.090 08/25/2020       Component Value Date/Time   CHOL 183 08/25/2020 1535   HDL 46 08/25/2020 1535   CHOLHDL 4.0 08/25/2020 1535   CHOLHDL 6.8 09/09/2018 0706   VLDL 66 (H) 09/09/2018 0706   LDLCALC 93 08/25/2020 1535   LDLCALC 101 (H) 12/05/2016 1545    Lab Results  Component Value Date   AST 18 08/25/2020   Lab Results  Component Value Date   ALT 14 08/25/2020    Urinalysis Component     Latest Ref Rng & Units 02/02/2021  Specific Gravity, UA     1.005 - 1.030 1.020  pH, UA     5.0 - 7.5 6.0  Color, UA     Yellow Yellow  Appearance Ur     Clear Cloudy (A)  Leukocytes,UA     Negative 2+ (A)  Protein,UA     Negative/Trace Negative  Glucose, UA     Negative Negative  Ketones, UA     Negative Negative  RBC, UA     Negative Trace (A)  Bilirubin, UA     Negative Negative  Urobilinogen, Ur     0.2 - 1.0 mg/dL  0.2  Nitrite,  UA     Negative Negative  Microscopic Examination      See below:   Component     Latest Ref Rng & Units 02/02/2021  Specific Gravity, UA     1.005 - 1.030 1.020  pH, UA     5.0 - 7.5 6.0  Color, UA     Yellow Yellow  Appearance Ur     Clear Cloudy (A)  Leukocytes,UA     Negative 2+ (A)  Protein,UA     Negative/Trace Negative  Glucose, UA     Negative Negative  Ketones, UA     Negative Negative  RBC, UA     Negative Trace (A)  Bilirubin, UA     Negative Negative  Urobilinogen, Ur     0.2 - 1.0 mg/dL 0.2  Nitrite, UA     Negative Negative  Microscopic Examination      See below:     I have reviewed the labs.   Pertinent Imaging: N/A  Assessment & Plan:    1. Gross hematuria -secondary to possible UTI, cancer recurrence or daily ASA use  -UA -Urine culture  -start Macrobid 100 mg, BID x 7 days will adjust if necessary once culture is available  2. Bladder cancer -surveillance cysto scheduled for 02/25/2021 with Dr. Erlene Quan  Return for keep appointment with Dr. Erlene Quan on the 9th.  These notes generated with voice recognition software. I apologize for typographical errors.  Zara Council, PA-C  [facility] [address]  [place], [place] [address] [phone]

## 2021-02-01 NOTE — Telephone Encounter (Signed)
Pt. LMOM asking for someone to call her to discuss upcoming Cysto and changing it to the [facility]. She also has medication questions but did not specify what they were on the voicemail. Please advise.

## 2021-02-01 NOTE — Telephone Encounter (Signed)
Please read my last note.  I sent this to the pharmacy on 12/03/2020 at her last visit to be taken around the time of her next cystoscopy.  The pharmacy should already have the medication.  We also wrote this on her AVS.  Hollice Espy, MD

## 2021-02-01 NOTE — Telephone Encounter (Signed)
Spoke with patient, I moved her appointment to 02/18/2021 in [employer]. She is asking about starting her antibiotic prior to the cystoscopy. She would need it sent to pharmacy.

## 2021-02-02 ENCOUNTER — Encounter: Payer: Self-pay | Admitting: Urology

## 2021-02-02 ENCOUNTER — Ambulatory Visit (INDEPENDENT_AMBULATORY_CARE_PROVIDER_SITE_OTHER): Payer: Medicare Other | Admitting: Urology

## 2021-02-02 ENCOUNTER — Other Ambulatory Visit: Payer: Self-pay

## 2021-02-02 VITALS — BP 168/89 | HR 67 | Ht 63.0 in | Wt 201.0 lb

## 2021-02-02 DIAGNOSIS — C662 Malignant neoplasm of left ureter: Secondary | ICD-10-CM | POA: Diagnosis not present

## 2021-02-02 DIAGNOSIS — D090 Carcinoma in situ of bladder: Secondary | ICD-10-CM | POA: Diagnosis not present

## 2021-02-02 DIAGNOSIS — R310 Gross hematuria: Secondary | ICD-10-CM | POA: Diagnosis not present

## 2021-02-02 LAB — URINALYSIS, COMPLETE
Bilirubin, UA: NEGATIVE
Glucose, UA: NEGATIVE
Ketones, UA: NEGATIVE
Nitrite, UA: NEGATIVE
Protein,UA: NEGATIVE
Specific Gravity, UA: 1.02 (ref 1.005–1.030)
Urobilinogen, Ur: 0.2 mg/dL (ref 0.2–1.0)
pH, UA: 6 (ref 5.0–7.5)

## 2021-02-02 LAB — MICROSCOPIC EXAMINATION
Epithelial Cells (non renal): >10 /hpf — AB (ref 0–10)
WBC, UA: >30 /hpf — AB (ref 0–5)

## 2021-02-02 MED ORDER — NITROFURANTOIN MONOHYD MACRO 100 MG PO CAPS: 100.0000 mg | ORAL_CAPSULE | Freq: Two times a day (BID) | ORAL | 0 refills | Status: DC

## 2021-02-07 ENCOUNTER — Other Ambulatory Visit: Payer: Self-pay | Admitting: *Deleted

## 2021-02-07 ENCOUNTER — Other Ambulatory Visit: Payer: Self-pay | Admitting: Family Medicine

## 2021-02-07 DIAGNOSIS — E119 Type 2 diabetes mellitus without complications: Secondary | ICD-10-CM

## 2021-02-07 DIAGNOSIS — R310 Gross hematuria: Secondary | ICD-10-CM

## 2021-02-07 LAB — CULTURE, URINE COMPREHENSIVE

## 2021-02-08 ENCOUNTER — Ambulatory Visit: Payer: Medicare Other

## 2021-02-10 ENCOUNTER — Other Ambulatory Visit: Payer: Self-pay

## 2021-02-10 ENCOUNTER — Ambulatory Visit (INDEPENDENT_AMBULATORY_CARE_PROVIDER_SITE_OTHER): Payer: Medicare Other | Admitting: Urology

## 2021-02-10 ENCOUNTER — Encounter: Payer: Self-pay | Admitting: Urology

## 2021-02-10 ENCOUNTER — Other Ambulatory Visit: Payer: Medicare Other

## 2021-02-10 VITALS — BP 157/95 | HR 88 | Ht 63.0 in | Wt 201.0 lb

## 2021-02-10 DIAGNOSIS — R310 Gross hematuria: Secondary | ICD-10-CM | POA: Diagnosis not present

## 2021-02-10 LAB — MICROSCOPIC EXAMINATION
Bacteria, UA: NONE SEEN
RBC, Urine: >30 /hpf — ABNORMAL HIGH (ref 0–2)

## 2021-02-10 LAB — URINALYSIS, COMPLETE
Bilirubin, UA: NEGATIVE
Glucose, UA: NEGATIVE
Ketones, UA: NEGATIVE
Leukocytes,UA: NEGATIVE
Nitrite, UA: NEGATIVE
Protein,UA: NEGATIVE
Specific Gravity, UA: 1.02 (ref 1.005–1.030)
Urobilinogen, Ur: 0.2 mg/dL (ref 0.2–1.0)
pH, UA: 5.5 (ref 5.0–7.5)

## 2021-02-10 NOTE — Progress Notes (Signed)
In and Out Catheterization  Patient is present today for a I & O catheterization due to contaminated urine culture. Patient was cleaned and prepped in a sterile fashion with betadine . A 14 FR cath was inserted no complications were noted , 70 ml of urine return was noted, urine was yellow clear in color. A clean urine sample was collected for UA and urine culture. Bladder was drained  And catheter was removed with out difficulty.    Performed by: Zara Council, PA-C and Daryel Gerald, CMA

## 2021-02-13 LAB — CULTURE, URINE COMPREHENSIVE

## 2021-02-14 ENCOUNTER — Encounter: Payer: Self-pay | Admitting: Urology

## 2021-02-14 ENCOUNTER — Other Ambulatory Visit: Payer: Self-pay | Admitting: Urology

## 2021-02-14 DIAGNOSIS — N390 Urinary tract infection, site not specified: Secondary | ICD-10-CM

## 2021-02-14 MED ORDER — NITROFURANTOIN MONOHYD MACRO 100 MG PO CAPS: 100.0000 mg | ORAL_CAPSULE | Freq: Two times a day (BID) | ORAL | 0 refills | Status: DC

## 2021-02-18 ENCOUNTER — Other Ambulatory Visit: Payer: Medicare Other | Admitting: Urology

## 2021-02-22 ENCOUNTER — Ambulatory Visit (INDEPENDENT_AMBULATORY_CARE_PROVIDER_SITE_OTHER): Payer: Medicare Other | Admitting: Family Medicine

## 2021-02-22 ENCOUNTER — Encounter: Payer: Self-pay | Admitting: Family Medicine

## 2021-02-22 ENCOUNTER — Other Ambulatory Visit: Payer: Self-pay

## 2021-02-22 VITALS — BP 123/62 | HR 83 | Temp 98.6°F | Wt 201.0 lb

## 2021-02-22 DIAGNOSIS — E559 Vitamin D deficiency, unspecified: Secondary | ICD-10-CM | POA: Diagnosis not present

## 2021-02-22 DIAGNOSIS — M1990 Unspecified osteoarthritis, unspecified site: Secondary | ICD-10-CM | POA: Diagnosis not present

## 2021-02-22 DIAGNOSIS — E7800 Pure hypercholesterolemia, unspecified: Secondary | ICD-10-CM

## 2021-02-22 DIAGNOSIS — K219 Gastro-esophageal reflux disease without esophagitis: Secondary | ICD-10-CM

## 2021-02-22 DIAGNOSIS — I10 Essential (primary) hypertension: Secondary | ICD-10-CM

## 2021-02-22 DIAGNOSIS — E119 Type 2 diabetes mellitus without complications: Secondary | ICD-10-CM

## 2021-02-22 DIAGNOSIS — Z01818 Encounter for other preprocedural examination: Secondary | ICD-10-CM

## 2021-02-22 NOTE — Progress Notes (Signed)
Established patient visit   [name]   DOB: [birth year]   85 y.o. Female  MRN: [id number] Visit Date: 02/22/2021  Today's healthcare provider: Lelon Huh, MD   Chief Complaint  Patient presents with   Medical Clearance   Subjective    Diarrhea  This is a chronic problem. The current episode started more than 1 year ago. The problem has been waxing and waning. The patient states that diarrhea does not awaken her from sleep. Associated symptoms include arthralgias. Pertinent negatives include no abdominal pain, fever, headaches, myalgias or vomiting.    Medication clearance for left knee replacement.  Surgery is going to be done at [facility] 03/23/2021 with Dr. Harlow Mares.  Was originally scheduled in October, but she bruised her leg and developed a secondary cellulitis causing the surgery to be cancelled. Denies history of surgical or anesthetic complications of any of her prior surgeries.  Past Surgical History:  Procedure Laterality Date   ABDOMINAL HYSTERECTOMY  [id number]   TAH/BSO   BIOPSY Left 11/11/2018   Procedure: BIOPSY;  Surgeon: Hollice Espy, MD;  Location: [facility];  Service: Urology;  Laterality: Left;   BREAST BIOPSY Right 01/23/2013   [place] bx/clip-neg   BREAST SURGERY Left 07/1984   Mastectomy   callus removed from left toe     CATARACT EXTRACTION W/ INTRAOCULAR LENS IMPLANT Left 2015   CYSTOSCOPY W/ RETROGRADES Left 02/24/2019   Procedure: CYSTOSCOPY WITH RETROGRADE PYELOGRAM;  Surgeon: Hollice Espy, MD;  Location: [facility];  Service: Urology;  Laterality: Left;   CYSTOSCOPY WITH BIOPSY N/A 02/24/2019   Procedure: CYSTOSCOPY WITH Bladder BIOPSY;  Surgeon: Hollice Espy, MD;  Location: [facility];  Service: Urology;  Laterality: N/A;   CYSTOSCOPY WITH FULGERATION N/A 02/24/2019   Procedure: CYSTOSCOPY WITH FULGERATION;  Surgeon: Hollice Espy, MD;  Location: [facility];  Service: Urology;  Laterality: N/A;   CYSTOSCOPY WITH RETROGRADE PYELOGRAM,  URETEROSCOPY AND STENT PLACEMENT Left 11/24/2019   Procedure: CYSTOSCOPY WITH RETROGRADE PYELOGRAM, URETEROSCOPY;  Surgeon: Hollice Espy, MD;  Location: [facility];  Service: Urology;  Laterality: Left;   CYSTOSCOPY WITH URETEROSCOPY AND STENT PLACEMENT Left 10/14/2018   Procedure: CYSTOSCOPY WITH URETEROSCOPY AND STENT PLACEMENT;  Surgeon: Hollice Espy, MD;  Location: [facility];  Service: Urology;  Laterality: Left;   CYSTOSCOPY WITH URETEROSCOPY AND STENT PLACEMENT Left 02/24/2019   Procedure: CYSTOSCOPY WITH URETEROSCOPY AND STENT Exchange;  Surgeon: Hollice Espy, MD;  Location: [facility];  Service: Urology;  Laterality: Left;   CYSTOSCOPY/URETEROSCOPY/HOLMIUM LASER/STENT PLACEMENT Left 11/11/2018   Procedure: CYSTOSCOPY/URETEROSCOPY//STENT EXCHANGE;  Surgeon: Hollice Espy, MD;  Location: [facility];  Service: Urology;  Laterality: Left;   EYE SURGERY Left 2013   FOOT SURGERY Left 04/2013   HOLMIUM LASER APPLICATION Left 04/17/537   Procedure: HOLMIUM LASER APPLICATION;  Surgeon: Hollice Espy, MD;  Location: [facility];  Service: Urology;  Laterality: Left;   JOINT REPLACEMENT     KNEE ARTHROPLASTY Right 11/03/2015   Procedure: COMPUTER ASSISTED TOTAL KNEE ARTHROPLASTY;  Surgeon: Dereck Leep, MD;  Location: [facility];  Service: Orthopedics;  Laterality: Right;   MASTECTOMY Left 1986   positive   URETERAL BIOPSY Left 10/14/2018   Procedure: URETERAL BIOPSY;  Surgeon: Hollice Espy, MD;  Location: [facility];  Service: Urology;  Laterality: Left;   URETERAL BIOPSY Left 02/24/2019   Procedure: URETERAL BIOPSY;  Surgeon: Hollice Espy, MD;  Location: [facility];  Service: Urology;  Laterality: Left;    Denies chest pains, palpitations, or dyspnea  on exertion. She does have diabetes with last a1c of 6.2 on 08/25/2020. Tolerating current medications well.    Medications: Outpatient Medications Prior to Visit  Medication Sig   acetaminophen (TYLENOL) 500 MG tablet Take 1,000 mg by mouth every  6 (six) hours as needed for moderate pain (pain.).    aspirin EC 81 MG tablet Take 81 mg by mouth at bedtime.   atorvastatin (LIPITOR) 40 MG tablet Take 1 tablet by mouth once daily   Calcium Carb-Cholecalciferol (CALCIUM+D3 PO) Take 1 tablet by mouth daily.    cholecalciferol (VITAMIN D) 25 MCG (1000 UT) tablet Take 1,000 Units by mouth at bedtime.   hydrocortisone 2.5 % ointment Apply topically.   ketoconazole (NIZORAL) 2 % shampoo Apply 1 application topically as needed (flaky/itchy scalp). Up to once per week   levETIRAcetam (KEPPRA) 500 MG tablet Take by mouth.   metFORMIN (GLUCOPHAGE) 500 MG tablet Take 1 tablet by mouth once daily with breakfast   Multiple Vitamin (MULTIVITAMIN WITH MINERALS) TABS tablet Take 1 tablet by mouth daily at 2 PM.   nitrofurantoin, macrocrystal-monohydrate, (MACROBID) 100 MG capsule Take 1 capsule (100 mg total) by mouth every 12 (twelve) hours. Take 1 capsule (100 mg total) by mouth every 12 (twelve) hours. Start this medication 3 days before your scheduled cystoscopy which is on a Friday.  This means that you should start this medication on Tuesday and then complete the course for a total of 5 days.   nystatin cream (MYCOSTATIN) APPLY CREAM TOPICALLY TWICE DAILY UNTIL REDNESS IS CLEAR   Phenazopyridine HCl (AZO DINE PO) Take by mouth.   No facility-administered medications prior to visit.    Review of Systems  Constitutional: Negative.  Negative for fever.  Respiratory: Negative.    Cardiovascular: Negative.   Gastrointestinal:  Positive for diarrhea. Negative for abdominal distention, abdominal pain, anal bleeding, blood in stool, constipation, nausea, rectal pain and vomiting.  Musculoskeletal:  Positive for arthralgias and gait problem. Negative for back pain, joint swelling, myalgias, neck pain and neck stiffness.  Neurological:  Negative for dizziness, light-headedness and headaches.      Objective    BP 123/62 (BP Location: Right Arm, Patient  Position: Sitting, Cuff Size: Large)    Pulse 83    Temp 98.6 F (37 C) (Oral)    Wt 201 lb (91.2 kg)    SpO2 98%    BMI 35.61 kg/m    Physical Exam   General: Appearance:    Obese female in no acute distress  Eyes:    PERRL, conjunctiva/corneas clear, EOM's intact       Lungs:     Clear to auscultation bilaterally, respirations unlabored  Heart:    Normal heart rate. Normal rhythm. No murmurs, rubs, or gallops.    MS:   All extremities are intact.  Slight discoloration left distal leg from old contusion, slightly tender to touch, no erythema.   Neurologic:   Awake, alert, oriented x 3. No apparent focal neurological defect.        EKG: No changes compared to EKG of 11/24/2020  Assessment & Plan     1. Preop general physical exam Patient has no absolute or relative contraindications to planned surgery and anesthesia including general anesthesia. He is at low risk for cardiac and pulmonary complications. Anticipates pre-op labs being done at upcoming appointment. If labs normal then no additional medical precautions recommended.    - EKG 12-Lead  2. Primary hypertension Well controlled.  Continue current medications.   -  EKG 12-Lead - CBC with Differential/Platelet  3. Hypercholesterolemia She is tolerating atorvastatin well with no adverse effects.    4. Avitaminosis D  - VITAMIN D 25 Hydroxy (Vit-D Deficiency, Fractures)  5. Osteoarthritis, unspecified osteoarthritis type, unspecified site   6. Gastroesophageal reflux disease, unspecified whether esophagitis present Diet controlled.  - Magnesium  7. Type 2 diabetes mellitus without complication, without long-term current use of insulin ([place]) Doing very well on current metformin.  - Comprehensive metabolic panel - Hemoglobin A1c        The entirety of the information documented in the History of Present Illness, Review of Systems and Physical Exam were personally obtained by me. Portions of this information were  initially documented by the CMA and reviewed by me for thoroughness and accuracy.     Lelon Huh, MD  [facility] [phone] (phone) [phone] (fax)  [place]

## 2021-02-23 ENCOUNTER — Other Ambulatory Visit: Payer: Medicare Other

## 2021-02-23 ENCOUNTER — Other Ambulatory Visit: Payer: Medicare Other | Admitting: Urology

## 2021-02-23 LAB — COMPREHENSIVE METABOLIC PANEL
ALT: 14 IU/L (ref 0–32)
AST: 17 IU/L (ref 0–40)
Albumin/Globulin Ratio: 1.9 (ref 1.2–2.2)
Albumin: 4.2 g/dL (ref 3.6–4.6)
Alkaline Phosphatase: 118 IU/L (ref 44–121)
BUN/Creatinine Ratio: 22 (ref 12–28)
BUN: 14 mg/dL (ref 8–27)
Bilirubin Total: 0.4 mg/dL (ref 0.0–1.2)
CO2: 24 mmol/L (ref 20–29)
Calcium: 9.8 mg/dL (ref 8.7–10.3)
Chloride: 105 mmol/L (ref 96–106)
Creatinine, Ser: 0.64 mg/dL (ref 0.57–1.00)
Globulin, Total: 2.2 g/dL (ref 1.5–4.5)
Glucose: 93 mg/dL (ref 70–99)
Potassium: 4.1 mmol/L (ref 3.5–5.2)
Sodium: 142 mmol/L (ref 134–144)
Total Protein: 6.4 g/dL (ref 6.0–8.5)
eGFR: 88 mL/min/{1.73_m2} (ref >59)

## 2021-02-23 LAB — CBC WITH DIFFERENTIAL/PLATELET
Basophils Absolute: 0.1 10*3/uL (ref 0.0–0.2)
Basos: 1 %
EOS (ABSOLUTE): 0.1 10*3/uL (ref 0.0–0.4)
Eos: 1 %
Hematocrit: 43.2 % (ref 34.0–46.6)
Hemoglobin: 14.8 g/dL (ref 11.1–15.9)
Immature Grans (Abs): 0 10*3/uL (ref 0.0–0.1)
Immature Granulocytes: 0 %
Lymphocytes Absolute: 1.9 10*3/uL (ref 0.7–3.1)
Lymphs: 17 %
MCH: 31.2 pg (ref 26.6–33.0)
MCHC: 34.3 g/dL (ref 31.5–35.7)
MCV: 91 fL (ref 79–97)
Monocytes Absolute: 1.1 10*3/uL — ABNORMAL HIGH (ref 0.1–0.9)
Monocytes: 10 %
Neutrophils Absolute: 7.7 10*3/uL — ABNORMAL HIGH (ref 1.4–7.0)
Neutrophils: 71 %
Platelets: 321 10*3/uL (ref 150–450)
RBC: 4.74 x10E6/uL (ref 3.77–5.28)
RDW: 13.6 % (ref 11.7–15.4)
WBC: 11 10*3/uL — ABNORMAL HIGH (ref 3.4–10.8)

## 2021-02-23 LAB — MAGNESIUM: Magnesium: 2 mg/dL (ref 1.6–2.3)

## 2021-02-23 LAB — HEMOGLOBIN A1C
Est. average glucose Bld gHb Est-mCnc: 123 mg/dL
Hgb A1c MFr Bld: 5.9 % — ABNORMAL HIGH (ref 4.8–5.6)

## 2021-02-23 LAB — VITAMIN D 25 HYDROXY (VIT D DEFICIENCY, FRACTURES): Vit D, 25-Hydroxy: 33.7 ng/mL (ref 30.0–100.0)

## 2021-02-24 ENCOUNTER — Other Ambulatory Visit: Payer: Self-pay

## 2021-02-24 MED ORDER — CEFDINIR 300 MG PO CAPS: 600.0000 mg | ORAL_CAPSULE | Freq: Every day | ORAL | 0 refills | Status: AC

## 2021-02-24 NOTE — Telephone Encounter (Signed)
Cefdnir is related to cephalexin and ceftriaxone which she has taking in the past without reactions. It is very rare people with penicillin allergies to allergic to cefdnir. Have sent prescription to her pharmacy.

## 2021-02-24 NOTE — Telephone Encounter (Signed)
Patient advised and verbalized understanding.   Prescription is pending in this encounter. Patient wanted to be sure we were aware that she has a penicillin allergy. When I tried to sign off on this medication, a contraindication warning box appeared informing me that patient has a penicillin allergy. Please review warning box and advise if ok to send prescription to [facility].   Patient wants to know if the elevated WBC count could be coming from the UTI or her leg infection? She wants to know if she needs to schedule an appointment after she has completed antibiotics to make sure infection has cleared. Patient says that her surgery has already been postponed once, so she wants to make sure everything checks out ok before her surgery date. Please advise.

## 2021-02-24 NOTE — Progress Notes (Signed)
° °  02/25/21  CC:  Chief Complaint  Patient presents with   Cysto     HPI: Elizabeth Trujillo is a 85 y.o.female with a personal history bladder cancer and high risk hematuria, who presents today for surveillance cystoscopy.   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 She underwent ablative therapy and BCG in 2019. She returned to the operating room on 12/20 for surveillance including a cystoscopy and ureteroscopy w/ NED at that time. Personally discussed w/ radiology regarding her distal ureter malignancy.   She went to th operating room on 11/2019 for surveillance ureteroscopy.  Bilateral  retrograde indicated some slight narrowing of the left distal ureter with prompt drainage, ureteroscopy was unremarkable  11/2020 cystoscopy showed no evidence of disease and CT urogram was negative for recurrent or metastatic disease.   She is currently on double antibiotics as in her preop labs, she had a leukocytosis but was unclear whether this was related to possible bladder infection versus lower extremity cellulitis and thus was being treated regardless as a precaution.  As such, urinalysis today was deferred.    Vitals:   02/25/21 1025  BP: (!) 161/90  Pulse: 75   NED. A&Ox3.   No respiratory distress   Abd soft, NT, ND Normal external genitalia with patent urethral meatus  Cystoscopy Procedure Note  Patient identification was confirmed, informed consent was obtained, and patient was prepped using Betadine solution.  Lidocaine jelly was administered per urethral meatus.    Procedure: - Flexible cystoscope introduced, without any difficulty.   - Thorough search of the bladder revealed:    normal urethral meatus    normal urothelium    no stones    no ulcers     no tumors    no urethral polyps    no trabeculation  - Ureteral orifices were normal in position and appearance.  Post-Procedure: -  Patient tolerated the procedure well  Assessment/ Plan:  1. CIS (carcinoma in situ of bladder) No evidence of disease today in the bladder, I will send urine cytology  Continue q. 35-month cystoscopy is with periprocedural antibiotics before the procedure as she is chronically colonized - Cytology - Non PAP; - CT HEMATURIA WORKUP; Future  2. Urothelial carcinoma of left distal ureter ([place]) We will plan for surveillance CT urogram in March to evaluate upper tracts - CT HEMATURIA WORKUP; Future  3. Recurrent UTI Periprocedural antibiotics as above prescribed today in anticipation of neck cystoscopy 3 months from now - nitrofurantoin, macrocrystal-monohydrate, (MACROBID) 100 MG capsule; Take 1 capsule (100 mg total) by mouth every 12 (twelve) hours. Take 1 capsule (100 mg total) by mouth every 12 (twelve) hours. Start this medication 3 days before your scheduled cystoscopy which is on a Friday.  This means that you should start this medication on Tuesday and then complete the course for a total of 5 days.  Dispense: 10 capsule; Refill: 0   Cysto / Ct urogram 3 months  I,Ahlam Piscitelli,acting as a scribe for Hollice Espy, MD.,have documented all relevant documentation on the behalf of Hollice Espy, MD,as directed by  Hollice Espy, MD while in the presence of Hollice Espy, MD.   I have reviewed the above documentation for accuracy and completeness, and I agree with the above.   Hollice Espy, MD

## 2021-02-24 NOTE — Telephone Encounter (Addendum)
See message above

## 2021-02-24 NOTE — Telephone Encounter (Signed)
Birdie Sons, MD  02/24/2021  8:12 AM EST Back to Top    The macrobid will not do anything if there is any infection in her leg, so she can take both antibiotics. Please send in the cefdinir prescription.  Its ok to go ahead and get the shingles vaccine now.    Beverlee Nims, [facility]  02/23/2021  5:30 PM EST     Patient aware. Verbalized understanding. Currently taking Macrobid for procedure Friday and wants to know if she should wait to start another antibiotic. Also wants to know if it is okay to get shingles vaccine on Friday   Birdie Sons, MD  02/23/2021  7:41 AM EST     Her blood cell is slightly elevated, may still have a little infection. Recommend cefdinir 300mg , two tablets daily for 10 days.  A1c is very good at 5.9%, rest of labs are good.

## 2021-02-25 ENCOUNTER — Ambulatory Visit (INDEPENDENT_AMBULATORY_CARE_PROVIDER_SITE_OTHER): Payer: Medicare Other | Admitting: Urology

## 2021-02-25 ENCOUNTER — Other Ambulatory Visit: Payer: Self-pay | Admitting: *Deleted

## 2021-02-25 ENCOUNTER — Other Ambulatory Visit: Payer: Self-pay

## 2021-02-25 ENCOUNTER — Encounter: Payer: Self-pay | Admitting: Urology

## 2021-02-25 ENCOUNTER — Other Ambulatory Visit
Admission: RE | Admit: 2021-02-25 | Discharge: 2021-02-25 | Disposition: A | Discharge status: Home / Self Care | Payer: Medicare Other | Source: Ambulatory Visit | Attending: Urology | Admitting: Urology

## 2021-02-25 VITALS — BP 161/90 | HR 75

## 2021-02-25 DIAGNOSIS — C662 Malignant neoplasm of left ureter: Secondary | ICD-10-CM

## 2021-02-25 DIAGNOSIS — D090 Carcinoma in situ of bladder: Secondary | ICD-10-CM | POA: Diagnosis not present

## 2021-02-25 DIAGNOSIS — N390 Urinary tract infection, site not specified: Secondary | ICD-10-CM

## 2021-02-25 LAB — URINALYSIS, COMPLETE (UACMP) WITH MICROSCOPIC
Bacteria, UA: NONE SEEN
Bilirubin Urine: NEGATIVE
Glucose, UA: NEGATIVE mg/dL
Hgb urine dipstick: NEGATIVE
Ketones, ur: NEGATIVE mg/dL
Leukocytes,Ua: NEGATIVE
Nitrite: NEGATIVE
Protein, ur: NEGATIVE mg/dL
Specific Gravity, Urine: 1.015 (ref 1.005–1.030)
pH: 5 (ref 5.0–8.0)

## 2021-02-25 MED ORDER — NITROFURANTOIN MONOHYD MACRO 100 MG PO CAPS: 100.0000 mg | ORAL_CAPSULE | Freq: Two times a day (BID) | ORAL | 0 refills | Status: DC

## 2021-03-04 ENCOUNTER — Other Ambulatory Visit: Payer: Medicare Other

## 2021-03-08 ENCOUNTER — Other Ambulatory Visit: Payer: Medicare Other

## 2021-03-09 ENCOUNTER — Other Ambulatory Visit: Payer: Medicare Other

## 2021-03-10 ENCOUNTER — Encounter
Admission: RE | Admit: 2021-03-10 | Discharge: 2021-03-10 | Disposition: A | Discharge status: Home / Self Care | Payer: Medicare Other | Source: Ambulatory Visit | Attending: Orthopedic Surgery | Admitting: Orthopedic Surgery

## 2021-03-10 ENCOUNTER — Encounter: Payer: Self-pay | Admitting: Orthopedic Surgery

## 2021-03-10 ENCOUNTER — Encounter: Payer: Self-pay | Admitting: Gastroenterology

## 2021-03-10 ENCOUNTER — Other Ambulatory Visit: Payer: Self-pay

## 2021-03-10 ENCOUNTER — Ambulatory Visit (INDEPENDENT_AMBULATORY_CARE_PROVIDER_SITE_OTHER): Payer: Medicare Other | Admitting: Gastroenterology

## 2021-03-10 ENCOUNTER — Other Ambulatory Visit: Payer: Self-pay | Admitting: Orthopedic Surgery

## 2021-03-10 VITALS — BP 142/78 | HR 64 | Temp 98.0°F | Ht 63.0 in | Wt 201.8 lb

## 2021-03-10 DIAGNOSIS — R197 Diarrhea, unspecified: Secondary | ICD-10-CM | POA: Diagnosis not present

## 2021-03-10 DIAGNOSIS — Z79899 Other long term (current) drug therapy: Secondary | ICD-10-CM | POA: Diagnosis not present

## 2021-03-10 DIAGNOSIS — Z01812 Encounter for preprocedural laboratory examination: Secondary | ICD-10-CM | POA: Diagnosis not present

## 2021-03-10 DIAGNOSIS — Z01818 Encounter for other preprocedural examination: Secondary | ICD-10-CM

## 2021-03-10 HISTORY — DX: Anemia, unspecified: D64.9

## 2021-03-10 HISTORY — DX: Type 2 diabetes mellitus without complications: E11.9

## 2021-03-10 LAB — URINALYSIS, ROUTINE W REFLEX MICROSCOPIC
Bilirubin Urine: NEGATIVE
Glucose, UA: NEGATIVE mg/dL
Ketones, ur: NEGATIVE mg/dL
Nitrite: NEGATIVE
Protein, ur: NEGATIVE mg/dL
Specific Gravity, Urine: 1.01 (ref 1.005–1.030)
pH: 7 (ref 5.0–8.0)

## 2021-03-10 LAB — TYPE AND SCREEN
ABO/RH(D): O POS
Antibody Screen: NEGATIVE

## 2021-03-10 LAB — SURGICAL PCR SCREEN
MRSA, PCR: NEGATIVE
Staphylococcus aureus: NEGATIVE

## 2021-03-10 NOTE — H&P (Deleted)
  The note originally documented on this encounter has been moved the the encounter in which it belongs.  

## 2021-03-10 NOTE — H&P (Signed)
BRANDACE CARGLE MRN:  [id number] DOB/SEX:  [birth year]/female  CHIEF COMPLAINT:  Painful left Knee  HISTORY: Patient is a 85 y.o. female presented with a history of pain in the left knee. Onset of symptoms was gradual starting several years ago with gradually worsening course since that time. Prior procedures on the knee include none. Patient has been treated conservatively with over-the-counter NSAIDs and activity modification. Patient currently rates pain in the knee at 10 out of 10 with activity. There is pain at night.  PAST MEDICAL HISTORY: Patient Active Problem List   Diagnosis Date Noted   Type 2 diabetes mellitus without complication, without long-term current use of insulin ([place]) 02/22/2021   Genetic testing 06/17/2019   Family history of breast cancer    Family history of bladder cancer    Family history of lung cancer    History of cancer of ureter    TIA (transient ischemic attack) 11/01/2018   Aphasia 09/08/2018   HTN (hypertension) 09/08/2018   GERD (gastroesophageal reflux disease) 09/08/2018   Fatigue 01/06/2016   S/P total knee arthroplasty 11/03/2015   Knee pain 09/28/2015   Myalgia 09/28/2015   Allergic rhinitis 01/06/2015   Dermatitis, eczematoid 01/06/2015   Hypercholesterolemia 01/06/2015   Angiopathy 01/06/2015   Malignant neoplasm of breast ([place]) 01/06/2015   Adiposity 01/06/2015   Osteoarthritis 01/06/2015   Osteopenia 01/06/2015   Avitaminosis D 01/06/2015   Past Medical History:  Diagnosis Date   Arthritis    Breast cancer ([place]) 1986   left mastectomy   carcinoma in situ of urinary bladder 2019   BCG tx per pt   Collagen vascular disease ([place])    RA    Elevated triglycerides with high cholesterol    Family history of bladder cancer    Family history of breast cancer    Family history of lung cancer    GERD (gastroesophageal reflux disease)    history of reflux   History of cancer of ureter    Hypercholesterolemia    Hypertension     history of    Primary ureteral papillary carcinoma, left ([place]) 2019   Surgical resection and BCG  tx per pt   Seizure ([place])    last 08-2018   Stroke [facility])    TIA   Past Surgical History:  Procedure Laterality Date   ABDOMINAL HYSTERECTOMY  [id number]   TAH/BSO   BIOPSY Left 11/11/2018   Procedure: BIOPSY;  Surgeon: Hollice Espy, MD;  Location: [facility] ORS;  Service: Urology;  Laterality: Left;   BREAST BIOPSY Right 01/23/2013   [place] bx/clip-neg   BREAST SURGERY Left 07/1984   Mastectomy   callus removed from left toe     CATARACT EXTRACTION W/ INTRAOCULAR LENS IMPLANT Left 2015   CYSTOSCOPY W/ RETROGRADES Left 02/24/2019   Procedure: CYSTOSCOPY WITH RETROGRADE PYELOGRAM;  Surgeon: Hollice Espy, MD;  Location: [facility] ORS;  Service: Urology;  Laterality: Left;   CYSTOSCOPY WITH BIOPSY N/A 02/24/2019   Procedure: CYSTOSCOPY WITH Bladder BIOPSY;  Surgeon: Hollice Espy, MD;  Location: [facility];  Service: Urology;  Laterality: N/A;   CYSTOSCOPY WITH FULGERATION N/A 02/24/2019   Procedure: CYSTOSCOPY WITH FULGERATION;  Surgeon: Hollice Espy, MD;  Location: [facility];  Service: Urology;  Laterality: N/A;   CYSTOSCOPY WITH RETROGRADE PYELOGRAM, URETEROSCOPY AND STENT PLACEMENT Left 11/24/2019   Procedure: CYSTOSCOPY WITH RETROGRADE PYELOGRAM, URETEROSCOPY;  Surgeon: Hollice Espy, MD;  Location: [facility];  Service: Urology;  Laterality: Left;   CYSTOSCOPY WITH URETEROSCOPY AND STENT PLACEMENT Left 10/14/2018  Procedure: CYSTOSCOPY WITH URETEROSCOPY AND STENT PLACEMENT;  Surgeon: Hollice Espy, MD;  Location: [facility];  Service: Urology;  Laterality: Left;   CYSTOSCOPY WITH URETEROSCOPY AND STENT PLACEMENT Left 02/24/2019   Procedure: CYSTOSCOPY WITH URETEROSCOPY AND STENT Exchange;  Surgeon: Hollice Espy, MD;  Location: [facility];  Service: Urology;  Laterality: Left;   CYSTOSCOPY/URETEROSCOPY/HOLMIUM LASER/STENT PLACEMENT Left 11/11/2018   Procedure: CYSTOSCOPY/URETEROSCOPY//STENT EXCHANGE;   Surgeon: Hollice Espy, MD;  Location: [facility];  Service: Urology;  Laterality: Left;   EYE SURGERY Left 2013   FOOT SURGERY Left 04/2013   HOLMIUM LASER APPLICATION Left 1/0/2585   Procedure: HOLMIUM LASER APPLICATION;  Surgeon: Hollice Espy, MD;  Location: [facility];  Service: Urology;  Laterality: Left;   JOINT REPLACEMENT     KNEE ARTHROPLASTY Right 11/03/2015   Procedure: COMPUTER ASSISTED TOTAL KNEE ARTHROPLASTY;  Surgeon: Dereck Leep, MD;  Location: [facility];  Service: Orthopedics;  Laterality: Right;   MASTECTOMY Left 1986   positive   URETERAL BIOPSY Left 10/14/2018   Procedure: URETERAL BIOPSY;  Surgeon: Hollice Espy, MD;  Location: [facility];  Service: Urology;  Laterality: Left;   URETERAL BIOPSY Left 02/24/2019   Procedure: URETERAL BIOPSY;  Surgeon: Hollice Espy, MD;  Location: [facility];  Service: Urology;  Laterality: Left;     MEDICATIONS:  (Not in a hospital admission)   ALLERGIES:   Allergies  Allergen Reactions   Penicillin V Potassium Hives    Has patient had a PCN reaction causing immediate rash, facial/tongue/throat swelling, SOB or lightheadedness with hypotension: no Has patient had a PCN reaction causing severe rash involving mucus membranes or skin necrosis: {no Has patient had a PCN reaction that required hospitalization no Has patient had a PCN reaction occurring within the last 10 years: no If all of the above answers are "NO", then may proceed with Cephalosporin use.   Sulfa Antibiotics Hives    REVIEW OF SYSTEMS:  Pertinent items are noted in HPI.   FAMILY HISTORY:   Family History  Problem Relation Age of Onset   Glaucoma Mother    Kidney disease Mother    Arthritis Mother    Stroke Mother    Breast cancer Mother 58   Bladder Cancer Mother 59   Heart disease Father    Cancer Brother 44       lung cancer, smoker   Breast cancer Cousin        3 mat cousins dx late 24s   Breast cancer Cousin        1 mat cousin dx 85s-60s     SOCIAL HISTORY:   Social History   Tobacco Use   Smoking status: Never   Smokeless tobacco: Never  Substance Use Topics   Alcohol use: No     EXAMINATION:  Vital signs in last 24 hours: @VSRANGES @  General appearance: alert, cooperative, and no distress Neck: no JVD and supple, symmetrical, trachea midline Lungs: clear to auscultation bilaterally Heart: regular rate and rhythm, S1, S2 normal, no murmur, click, rub or gallop Abdomen: soft, non-tender; bowel sounds normal; no masses,  no organomegaly Extremities: extremities normal, atraumatic, no cyanosis or edema and Homans sign is negative, no sign of DVT Pulses: 2+ and symmetric Skin: Skin color, texture, turgor normal. No rashes or lesions Neurologic: Alert and oriented X 3, normal strength and tone. Normal symmetric reflexes. Normal coordination and gait  Musculoskeletal:  ROM 0-100, Ligaments intact,  Imaging Review Plain radiographs demonstrate severe degenerative joint disease of the left knee.  The overall alignment is significant varus. The bone quality appears to be good for age and reported activity level.  Assessment/Plan: Primary osteoarthritis, left knee   The patient history, physical examination and imaging studies are consistent with advanced degenerative joint disease of the left knee. The patient has failed conservative treatment.  The clearance notes were reviewed.  After discussion with the patient it was felt that Total Knee Replacement was indicated. The procedure,  risks, and benefits of total knee arthroplasty were presented and reviewed. The risks including but not limited to aseptic loosening, infection, blood clots, vascular injury, stiffness, patella tracking problems complications among others were discussed. The patient acknowledged the explanation, agreed to proceed with the plan.  Carlynn Spry 03/10/2021, 1:40 PM

## 2021-03-10 NOTE — Progress Notes (Signed)
Jonathon Bellows MD, MRCP(U.K) [address]  [place]  [place], [place] [address]  Main: [phone]  Fax: [phone]   Primary Care Physician: [name]., MD  Primary Gastroenterologist:  Dr. Jonathon Bellows    Chief complaint: Diarrhea  HPI: Elizabeth Trujillo is a 85 y.o. female  Summary of history :   She is here today to see me as a follow up for osmotic diarrhea. Initially referred and seen on 01/01/18 for diarrhea. 12/22/17: C diff , lactoferrin, stool culture negative. Last colonoscopy was 13 years back and was normal . Began 2-3 months earlier and was getting worse , 1-3 episodes a day , lots of gas. Artificial sugars/sodas/chewing gum:  Sweet n low- once a day, 3 cans of diet soda a week , ice cream - 3-5 times a week , "low calorie"  .   Interval history 05/13/2018-03/10/2021  She states that over the past 2 years has had episodes of diarrhea.  She brought in a piece of paper which showed a few episodes of diarrhea in October 1 episode in November and 1 episode in December.  She has been taking antibiotics on and off for urinary tract infection.  Denies any artificial sugars or sweeteners.  She is on metformin.  She takes probiotics.  She is due to undergo a knee replacement shortly.  Current Outpatient Medications  Medication Sig Dispense Refill   acetaminophen (TYLENOL) 500 MG tablet Take 1,000 mg by mouth every 6 (six) hours as needed for moderate pain (pain.).      aspirin EC 81 MG tablet Take 81 mg by mouth at bedtime.     atorvastatin (LIPITOR) 40 MG tablet Take 1 tablet by mouth once daily 90 tablet 3   Calcium Carb-Cholecalciferol (CALCIUM+D3 PO) Take 1 tablet by mouth daily.      cholecalciferol (VITAMIN D) 25 MCG (1000 UT) tablet Take 1,000 Units by mouth at bedtime.     hydrocortisone 2.5 % ointment Apply topically as needed (rash; hemorrhoids).     ketoconazole (NIZORAL) 2 % shampoo Apply 1 application topically as needed (flaky/itchy scalp). Up to  once per week     levETIRAcetam (KEPPRA) 500 MG tablet Take 500 mg by mouth 2 (two) times daily.     loperamide (IMODIUM) 2 MG capsule Take 2 mg by mouth as needed for diarrhea or loose stools.     metFORMIN (GLUCOPHAGE) 500 MG tablet Take 1 tablet by mouth once daily with breakfast 90 tablet 3   Multiple Vitamin (MULTIVITAMIN WITH MINERALS) TABS tablet Take 1 tablet by mouth daily at 2 PM.     nitrofurantoin, macrocrystal-monohydrate, (MACROBID) 100 MG capsule Take 1 capsule (100 mg total) by mouth every 12 (twelve) hours. Take 1 capsule (100 mg total) by mouth every 12 (twelve) hours. Start this medication 3 days before your scheduled cystoscopy which is on a Friday.  This means that you should start this medication on Tuesday and then complete the course for a total of 5 days. (Patient taking differently: Take 100 mg by mouth every 12 (twelve) hours. Take 1 capsule (100 mg total) by mouth every 12 (twelve) hours. Start this medication 3 days before your scheduled cystoscopy which is on a Friday.  This means that you should start this medication on Tuesday and then complete the course for a total of 5 days.  Starts 3 days before cystoscopy (every 3 months)) 10 capsule 0   nystatin cream (MYCOSTATIN) Apply 1 application topically daily  as needed for dry skin (fungal infection).     Phenazopyridine HCl (AZO DINE PO) Take 100 mg by mouth as needed (dysuria).     simethicone (MYLICON) 80 MG chewable tablet Chew 80 mg by mouth every 6 (six) hours as needed for flatulence.     No current facility-administered medications for this visit.    Allergies as of 03/10/2021 - Review Complete 03/10/2021  Allergen Reaction Noted   Penicillin v potassium Hives 01/14/2015   Sulfa antibiotics Hives 01/06/2015    ROS:  General: Negative for anorexia, weight loss, fever, chills, fatigue, weakness. ENT: Negative for hoarseness, difficulty swallowing , nasal congestion. CV: Negative for chest pain, angina,  palpitations, dyspnea on exertion, peripheral edema.  Respiratory: Negative for dyspnea at rest, dyspnea on exertion, cough, sputum, wheezing.  GI: See history of present illness. GU:  Negative for dysuria, hematuria, urinary incontinence, urinary frequency, nocturnal urination.  Endo: Negative for unusual weight change.    Physical Examination:   BP (!) 142/78 (BP Location: Right Arm, Patient Position: Sitting, Cuff Size: Normal)    Pulse 64    Temp 98 F (36.7 C) (Oral)    Ht 5\' 3"  (1.6 m)    Wt 201 lb 12.8 oz (91.5 kg)    BMI 35.75 kg/m   General: Well-nourished, well-developed in no acute distress.  Eyes: No icterus. Conjunctivae pink. Neuro: Alert and oriented x 3.  Grossly intact. Skin: Warm and dry, no jaundice.   Psych: Alert and cooperative, normal mood and affect.   Imaging Studies: No results found.  Assessment and Plan:   Elizabeth Trujillo is a 85 y.o. y/o female here to follow up for diarrhea : She had a few episodes in October 1 episode in November and another in December.  Explained this is a chronic problem with acute exacerbation which could be related to antibiotic use or could be related to use of probiotics or we need to entertain other conditions such as microscopic colitis or obstruction in the colon such as neoplasm.  I gave her the option to proceed with a colonoscopy right now but she said she wanted to wait till her knee surgery is completed which seems reasonable.  My suspicion for malignancy is low.  I explained in the interim she should stop the probiotics, commenced on a fiber supplement such as Metamucil 1 tablespoon 2-3 times a day.  She should see me back in 6 weeks.  If the diarrhea has not resolved we should consider colonoscopy.  If colonoscopy and biopsies are negative then she could try holding off metformin for a week or 2 to see if the diarrhea improves.  Since she does not have diarrhea all the time I would not entertain stool samples  Dr Jonathon Bellows   MD,MRCP [facility]) Follow up in 6 weeks

## 2021-03-10 NOTE — Patient Instructions (Addendum)
Metamucil take 1 tablespoon 2 times a day  Stop probiotic Do not use hydrocortisone cream for more than 7 day Follow up 6 weeks

## 2021-03-10 NOTE — Patient Instructions (Addendum)
Your procedure is scheduled on: 03/23/21 - Wednesday Report to the Registration Desk on the 1st floor of the [facility]. To find out your arrival time, please call [phone] between 1PM - 3PM on: 03/22/21 - Tuesday Report to [facility] for Covid Test on 03/21/21 between 8 am to 12:00pm.   REMEMBER: Instructions that are not followed completely may result in serious medical risk, up to and including death; or upon the discretion of your surgeon and anesthesiologist your surgery may need to be rescheduled.  Do not eat food after midnight the night before surgery.  No gum chewing, lozengers or hard candies.  You may however, drink CLEAR liquids up to 2 hours before you are scheduled to arrive for your surgery. Do not drink anything within 2 hours of your scheduled arrival time.  Clear liquids include: - water   TAKE THESE MEDICATIONS THE MORNING OF SURGERY WITH A SIP OF WATER:  - levETIRAcetam (KEPPRA) 500 MG tablet  Do Not Take metFORMIN (GLUCOPHAGE) 500 MG tablet on 01/09/ 01/10, and do not take the day of surgery.  Follow recommendations from Cardiologist, Pulmonologist or PCP regarding stopping Aspirin, Coumadin, Plavix, Eliquis, Pradaxa, or Pletal. Stop Aspirin 81 mg  5 days prior to surgery beginning 03/17/21.  One week prior to surgery: Stop Anti-inflammatories (NSAIDS) such as Advil, Aleve, Ibuprofen, Motrin, Naproxen, Naprosyn and Aspirin based products such as Excedrin, Goodys Powder, BC Powder.  Stop ANY OVER THE COUNTER supplements until after surgery.Multiple Vitamin (MULTIVITAMIN , VITAMIN D, CALCIUM+D3.  You may take Tylenol as directed if needed for pain up until the day of surgery.  No Alcohol for 24 hours before or after surgery.  No Smoking including e-cigarettes for 24 hours prior to surgery.  No chewable tobacco products for at least 6 hours prior to surgery.  No nicotine patches on the day of surgery.  Do not use any "recreational" drugs for at  least a week prior to your surgery.  Please be advised that the combination of cocaine and anesthesia may have negative outcomes, up to and including death. If you test positive for cocaine, your surgery will be cancelled.  On the morning of surgery brush your teeth with toothpaste and water, you may rinse your mouth with mouthwash if you wish. Do not swallow any toothpaste or mouthwash.  Use CHG Soap or wipes as directed on instruction sheet.  Do not wear jewelry, make-up, hairpins, clips or nail polish.  Do not wear lotions, powders, or perfumes.   Do not shave body from the neck down 48 hours prior to surgery just in case you cut yourself which could leave a site for infection.  Also, freshly shaved skin may become irritated if using the CHG soap.  Contact lenses, hearing aids and dentures may not be worn into surgery.  Do not bring valuables to the hospital. [facility] is not responsible for any missing/lost belongings or valuables.   Notify your doctor if there is any change in your medical condition (cold, fever, infection).  Wear comfortable clothing (specific to your surgery type) to the hospital.  After surgery, you can help prevent lung complications by doing breathing exercises.  Take deep breaths and cough every 1-2 hours. Your doctor may order a device called an Incentive Spirometer to help you take deep breaths. When coughing or sneezing, hold a pillow firmly against your incision with both hands. This is called splinting. Doing this helps protect your incision. It also decreases belly discomfort.  If  you are being admitted to the hospital overnight, leave your suitcase in the car. After surgery it may be brought to your room.  If you are being discharged the day of surgery, you will not be allowed to drive home. You will need a responsible adult (18 years or older) to drive you home and stay with you that night.   If you are taking public transportation, you will  need to have a responsible adult (18 years or older) with you. Please confirm with your physician that it is acceptable to use public transportation.   Please call the [facility] at [phone] if you have any questions about these instructions.  Surgery Visitation Policy:  Patients undergoing a surgery or procedure may have one family member or support person with them as long as that person is not COVID-19 positive or experiencing its symptoms.  That person may remain in the waiting area during the procedure and may rotate out with other people.  Inpatient Visitation:    Visiting hours are 7 a.m. to 8 p.m. Up to two visitors ages 16+ are allowed at one time in a patient room. The visitors may rotate out with other people during the day. Visitors must check out when they leave, or other visitors will not be allowed. One designated support person may remain overnight. The visitor must pass COVID-19 screenings, use hand sanitizer when entering and exiting the patients room and wear a mask at all times, including in the patients room. Patients must also wear a mask when staff or their visitor are in the room. Masking is required regardless of vaccination status.

## 2021-03-11 LAB — URINE CULTURE: Culture: <10000 — AB

## 2021-03-16 ENCOUNTER — Telehealth: Payer: Self-pay

## 2021-03-16 DIAGNOSIS — Z01818 Encounter for other preprocedural examination: Secondary | ICD-10-CM

## 2021-03-16 NOTE — Progress Notes (Deleted)
°  ° ° °  Established patient visit   [name]   DOB: Feb 07, 1936   86 y.o. Female  MRN: [id number] Visit Date: 03/17/2021  Today's healthcare provider: Lavon Paganini, MD   No chief complaint on file.  Subjective    HPI  ***  Medications: Outpatient Medications Prior to Visit  Medication Sig   acetaminophen (TYLENOL) 500 MG tablet Take 1,000 mg by mouth every 6 (six) hours as needed for moderate pain (pain.).    aspirin EC 81 MG tablet Take 81 mg by mouth at bedtime.   atorvastatin (LIPITOR) 40 MG tablet Take 1 tablet by mouth once daily   Calcium Carb-Cholecalciferol (CALCIUM+D3 PO) Take 1 tablet by mouth daily.    cholecalciferol (VITAMIN D) 25 MCG (1000 UT) tablet Take 1,000 Units by mouth at bedtime.   hydrocortisone 2.5 % ointment Apply topically as needed (rash; hemorrhoids).   ketoconazole (NIZORAL) 2 % shampoo Apply 1 application topically as needed (flaky/itchy scalp). Up to once per week   levETIRAcetam (KEPPRA) 500 MG tablet Take 500 mg by mouth 2 (two) times daily.   loperamide (IMODIUM) 2 MG capsule Take 2 mg by mouth as needed for diarrhea or loose stools. (Patient not taking: Reported on 03/11/2021)   metFORMIN (GLUCOPHAGE) 500 MG tablet Take 1 tablet by mouth once daily with breakfast   Multiple Vitamin (MULTIVITAMIN WITH MINERALS) TABS tablet Take 1 tablet by mouth daily at 2 PM.   nitrofurantoin, macrocrystal-monohydrate, (MACROBID) 100 MG capsule Take 1 capsule (100 mg total) by mouth every 12 (twelve) hours. Take 1 capsule (100 mg total) by mouth every 12 (twelve) hours. Start this medication 3 days before your scheduled cystoscopy which is on a Friday.  This means that you should start this medication on Tuesday and then complete the course for a total of 5 days. (Patient taking differently: Take 100 mg by mouth every 12 (twelve) hours. Take 1 capsule (100 mg total) by mouth every 12 (twelve) hours. Start this medication 3 days before your scheduled  cystoscopy which is on a Friday.  This means that you should start this medication on Tuesday and then complete the course for a total of 5 days.  Starts 3 days before cystoscopy (every 3 months))   nystatin cream (MYCOSTATIN) Apply 1 application topically daily as needed for dry skin (fungal infection).   Phenazopyridine HCl (AZO DINE PO) Take 100 mg by mouth as needed (dysuria).   psyllium (METAMUCIL SMOOTH TEXTURE) 28 % packet Take 1 packet by mouth 2 (two) times daily. 2 -3 tablespoons a day   simethicone (MYLICON) 867 MG chewable tablet Chew 125 mg by mouth every 6 (six) hours as needed for flatulence (take 2 tablets).   simethicone (MYLICON) 80 MG chewable tablet Chew 80 mg by mouth every 6 (six) hours as needed for flatulence. (Patient not taking: Reported on 03/11/2021)   No facility-administered medications prior to visit.    Review of Systems  {Labs   Heme   Chem   Endocrine   Serology   Results Review (optional):23779}   Objective    There were no vitals taken for this visit. {Show previous vital signs (optional):23777}  Physical Exam  ***  No results found for any visits on 03/17/21.  Assessment & Plan     ***  No follow-ups on file.      {provider attestation***:1}   Lavon Paganini, MD  [facility] [phone] (phone) [phone] (fax)  [place]

## 2021-03-16 NOTE — Telephone Encounter (Signed)
FYI-Patient already had a pre op appointment with Dr. Caryn Section. I order a CBC to be repeated since patient's WBC were slightly elevated. I feel this is what her surgeon was talking about. Will go ahead and cancel patient's appointment since she had her labs and preop office visit done. Patient and patient's daughter was concerned that patient was not told what kind of infection she had and that the lab to recheck for infection was needed. CBC ordered and tomorrow's appointment canceled.

## 2021-03-16 NOTE — Telephone Encounter (Signed)
Copied from [place] [id number]. Topic: General - Other >> Mar 16, 2021 11:38 AM Tessa Lerner A wrote: Reason for CRM: The patient's husband has called to request that any labs needed for the patient be done prior to their visit tomorrow 03/17/20 at 3:40 PM  The patient's husband is concerned that the lab will be closed when the visit is done  Please contact further if needed

## 2021-03-17 ENCOUNTER — Telehealth: Payer: Self-pay

## 2021-03-17 ENCOUNTER — Ambulatory Visit: Payer: Medicare Other | Admitting: Family Medicine

## 2021-03-17 DIAGNOSIS — Z01818 Encounter for other preprocedural examination: Secondary | ICD-10-CM | POA: Diagnosis not present

## 2021-03-17 NOTE — Telephone Encounter (Signed)
Called patient and left her a detailed message letting her know the below information. I let her know that if she had further questions, to please call me back.

## 2021-03-17 NOTE — Telephone Encounter (Signed)
Patient called wanting to know how much of the Metamucil to take. She stated that Dr. Vicente Males wanted her to take 1 tablespoon 1-2 times a day but that the actual bottle of metamucil states teaspoon and in Dr. Georgeann Oppenheim note it stated 1 tablespoon 2-3 times a day. Can you please clarify the instructions so I could call the patient back. Patient also wanted to know if she is to continue taking the Metamucil up to her knee surgery which is on 03/23/2021 and then restart after the surgery? Please advise.

## 2021-03-17 NOTE — Telephone Encounter (Signed)
Noted  

## 2021-03-17 NOTE — Telephone Encounter (Signed)
Aim is for 4 grams of fiber twice daily - if the formulation has 4 grams of fiber in 1 teaspoon then that's good and yest can take upto knee surgery  and restart after

## 2021-03-18 ENCOUNTER — Other Ambulatory Visit: Payer: Medicare Other | Admitting: Urology

## 2021-03-18 LAB — CBC WITH DIFFERENTIAL/PLATELET
Basophils Absolute: 0.1 10*3/uL (ref 0.0–0.2)
Basos: 1 %
EOS (ABSOLUTE): 0.2 10*3/uL (ref 0.0–0.4)
Eos: 2 %
Hematocrit: 43.6 % (ref 34.0–46.6)
Hemoglobin: 14.5 g/dL (ref 11.1–15.9)
Immature Grans (Abs): 0 10*3/uL (ref 0.0–0.1)
Immature Granulocytes: 0 %
Lymphocytes Absolute: 1.8 10*3/uL (ref 0.7–3.1)
Lymphs: 19 %
MCH: 30.3 pg (ref 26.6–33.0)
MCHC: 33.3 g/dL (ref 31.5–35.7)
MCV: 91 fL (ref 79–97)
Monocytes Absolute: 0.8 10*3/uL (ref 0.1–0.9)
Monocytes: 9 %
Neutrophils Absolute: 6.4 10*3/uL (ref 1.4–7.0)
Neutrophils: 69 %
Platelets: 326 10*3/uL (ref 150–450)
RBC: 4.79 x10E6/uL (ref 3.77–5.28)
RDW: 13.3 % (ref 11.7–15.4)
WBC: 9.2 10*3/uL (ref 3.4–10.8)

## 2021-03-21 ENCOUNTER — Other Ambulatory Visit: Admission: RE | Admit: 2021-03-21 | Payer: Medicare Other | Source: Ambulatory Visit

## 2021-03-22 ENCOUNTER — Other Ambulatory Visit
Admission: RE | Admit: 2021-03-22 | Discharge: 2021-03-22 | Disposition: A | Discharge status: Home / Self Care | Payer: Medicare Other | Source: Ambulatory Visit | Attending: Orthopedic Surgery | Admitting: Orthopedic Surgery

## 2021-03-22 ENCOUNTER — Other Ambulatory Visit: Payer: Self-pay

## 2021-03-22 DIAGNOSIS — Z880 Allergy status to penicillin: Secondary | ICD-10-CM | POA: Diagnosis not present

## 2021-03-22 DIAGNOSIS — Z8554 Personal history of malignant neoplasm of ureter: Secondary | ICD-10-CM | POA: Diagnosis not present

## 2021-03-22 DIAGNOSIS — M25562 Pain in left knee: Secondary | ICD-10-CM | POA: Diagnosis not present

## 2021-03-22 DIAGNOSIS — I10 Essential (primary) hypertension: Secondary | ICD-10-CM | POA: Diagnosis present

## 2021-03-22 DIAGNOSIS — Z9842 Cataract extraction status, left eye: Secondary | ICD-10-CM | POA: Diagnosis not present

## 2021-03-22 DIAGNOSIS — M8580 Other specified disorders of bone density and structure, unspecified site: Secondary | ICD-10-CM | POA: Diagnosis present

## 2021-03-22 DIAGNOSIS — Z882 Allergy status to sulfonamides status: Secondary | ICD-10-CM | POA: Diagnosis not present

## 2021-03-22 DIAGNOSIS — Z20822 Contact with and (suspected) exposure to covid-19: Secondary | ICD-10-CM | POA: Diagnosis present

## 2021-03-22 DIAGNOSIS — Z86008 Personal history of in-situ neoplasm of other site: Secondary | ICD-10-CM | POA: Diagnosis not present

## 2021-03-22 DIAGNOSIS — Z803 Family history of malignant neoplasm of breast: Secondary | ICD-10-CM | POA: Diagnosis not present

## 2021-03-22 DIAGNOSIS — M1712 Unilateral primary osteoarthritis, left knee: Secondary | ICD-10-CM | POA: Diagnosis not present

## 2021-03-22 DIAGNOSIS — G8918 Other acute postprocedural pain: Secondary | ICD-10-CM | POA: Diagnosis not present

## 2021-03-22 DIAGNOSIS — Z961 Presence of intraocular lens: Secondary | ICD-10-CM | POA: Diagnosis present

## 2021-03-22 DIAGNOSIS — Z853 Personal history of malignant neoplasm of breast: Secondary | ICD-10-CM | POA: Diagnosis not present

## 2021-03-22 DIAGNOSIS — E7800 Pure hypercholesterolemia, unspecified: Secondary | ICD-10-CM | POA: Diagnosis present

## 2021-03-22 DIAGNOSIS — Z8249 Family history of ischemic heart disease and other diseases of the circulatory system: Secondary | ICD-10-CM | POA: Diagnosis not present

## 2021-03-22 DIAGNOSIS — K219 Gastro-esophageal reflux disease without esophagitis: Secondary | ICD-10-CM | POA: Diagnosis not present

## 2021-03-22 DIAGNOSIS — J309 Allergic rhinitis, unspecified: Secondary | ICD-10-CM | POA: Diagnosis present

## 2021-03-22 DIAGNOSIS — Z90710 Acquired absence of both cervix and uterus: Secondary | ICD-10-CM | POA: Diagnosis not present

## 2021-03-22 DIAGNOSIS — Z01812 Encounter for preprocedural laboratory examination: Secondary | ICD-10-CM | POA: Insufficient documentation

## 2021-03-22 DIAGNOSIS — Z96652 Presence of left artificial knee joint: Secondary | ICD-10-CM | POA: Diagnosis not present

## 2021-03-22 DIAGNOSIS — Z9012 Acquired absence of left breast and nipple: Secondary | ICD-10-CM | POA: Diagnosis not present

## 2021-03-22 DIAGNOSIS — Z8673 Personal history of transient ischemic attack (TIA), and cerebral infarction without residual deficits: Secondary | ICD-10-CM | POA: Diagnosis not present

## 2021-03-22 DIAGNOSIS — E119 Type 2 diabetes mellitus without complications: Secondary | ICD-10-CM | POA: Diagnosis present

## 2021-03-22 DIAGNOSIS — M7122 Synovial cyst of popliteal space [Baker], left knee: Secondary | ICD-10-CM | POA: Diagnosis not present

## 2021-03-22 DIAGNOSIS — Z96651 Presence of right artificial knee joint: Secondary | ICD-10-CM | POA: Diagnosis present

## 2021-03-22 LAB — SARS CORONAVIRUS 2 (TAT 6-24 HRS): SARS Coronavirus 2: NEGATIVE

## 2021-03-23 ENCOUNTER — Inpatient Hospital Stay: Payer: Medicare Other

## 2021-03-23 ENCOUNTER — Encounter: Payer: Self-pay | Admitting: Orthopedic Surgery

## 2021-03-23 ENCOUNTER — Encounter
Admission: RE | Disposition: A | Discharge status: Home / Self Care | Payer: Self-pay | Source: Home / Self Care | Attending: Orthopedic Surgery

## 2021-03-23 ENCOUNTER — Inpatient Hospital Stay
Admission: RE | Admit: 2021-03-23 | Discharge: 2021-03-27 | DRG: 470 | Disposition: A | Discharge status: Home Health | Payer: Medicare Other | Attending: Orthopedic Surgery | Admitting: Orthopedic Surgery

## 2021-03-23 ENCOUNTER — Inpatient Hospital Stay: Payer: Medicare Other | Admitting: Urgent Care

## 2021-03-23 DIAGNOSIS — M7122 Synovial cyst of popliteal space [Baker], left knee: Secondary | ICD-10-CM | POA: Diagnosis not present

## 2021-03-23 DIAGNOSIS — M1712 Unilateral primary osteoarthritis, left knee: Principal | ICD-10-CM | POA: Diagnosis present

## 2021-03-23 DIAGNOSIS — Z96659 Presence of unspecified artificial knee joint: Secondary | ICD-10-CM

## 2021-03-23 DIAGNOSIS — I10 Essential (primary) hypertension: Secondary | ICD-10-CM | POA: Diagnosis present

## 2021-03-23 DIAGNOSIS — E7800 Pure hypercholesterolemia, unspecified: Secondary | ICD-10-CM | POA: Diagnosis present

## 2021-03-23 DIAGNOSIS — Z880 Allergy status to penicillin: Secondary | ICD-10-CM | POA: Diagnosis not present

## 2021-03-23 DIAGNOSIS — Z8554 Personal history of malignant neoplasm of ureter: Secondary | ICD-10-CM | POA: Diagnosis not present

## 2021-03-23 DIAGNOSIS — Z9842 Cataract extraction status, left eye: Secondary | ICD-10-CM

## 2021-03-23 DIAGNOSIS — K219 Gastro-esophageal reflux disease without esophagitis: Secondary | ICD-10-CM | POA: Diagnosis present

## 2021-03-23 DIAGNOSIS — M8580 Other specified disorders of bone density and structure, unspecified site: Secondary | ICD-10-CM | POA: Diagnosis present

## 2021-03-23 DIAGNOSIS — Z96651 Presence of right artificial knee joint: Secondary | ICD-10-CM | POA: Diagnosis present

## 2021-03-23 DIAGNOSIS — Z86008 Personal history of in-situ neoplasm of other site: Secondary | ICD-10-CM | POA: Diagnosis not present

## 2021-03-23 DIAGNOSIS — E119 Type 2 diabetes mellitus without complications: Secondary | ICD-10-CM | POA: Diagnosis present

## 2021-03-23 DIAGNOSIS — J309 Allergic rhinitis, unspecified: Secondary | ICD-10-CM | POA: Diagnosis present

## 2021-03-23 DIAGNOSIS — Z853 Personal history of malignant neoplasm of breast: Secondary | ICD-10-CM | POA: Diagnosis not present

## 2021-03-23 DIAGNOSIS — Z90710 Acquired absence of both cervix and uterus: Secondary | ICD-10-CM

## 2021-03-23 DIAGNOSIS — M25562 Pain in left knee: Secondary | ICD-10-CM | POA: Diagnosis not present

## 2021-03-23 DIAGNOSIS — Z20822 Contact with and (suspected) exposure to covid-19: Secondary | ICD-10-CM | POA: Diagnosis present

## 2021-03-23 DIAGNOSIS — Z8249 Family history of ischemic heart disease and other diseases of the circulatory system: Secondary | ICD-10-CM

## 2021-03-23 DIAGNOSIS — G8918 Other acute postprocedural pain: Secondary | ICD-10-CM | POA: Diagnosis not present

## 2021-03-23 DIAGNOSIS — Z8673 Personal history of transient ischemic attack (TIA), and cerebral infarction without residual deficits: Secondary | ICD-10-CM

## 2021-03-23 DIAGNOSIS — Z803 Family history of malignant neoplasm of breast: Secondary | ICD-10-CM

## 2021-03-23 DIAGNOSIS — Z9012 Acquired absence of left breast and nipple: Secondary | ICD-10-CM | POA: Diagnosis not present

## 2021-03-23 DIAGNOSIS — Z882 Allergy status to sulfonamides status: Secondary | ICD-10-CM | POA: Diagnosis not present

## 2021-03-23 DIAGNOSIS — Z96652 Presence of left artificial knee joint: Secondary | ICD-10-CM

## 2021-03-23 DIAGNOSIS — Z961 Presence of intraocular lens: Secondary | ICD-10-CM | POA: Diagnosis present

## 2021-03-23 DIAGNOSIS — Z419 Encounter for procedure for purposes other than remedying health state, unspecified: Secondary | ICD-10-CM

## 2021-03-23 HISTORY — PX: TOTAL KNEE ARTHROPLASTY: SHX125

## 2021-03-23 LAB — GLUCOSE, CAPILLARY
Glucose-Capillary: 131 mg/dL — ABNORMAL HIGH (ref 70–99)
Glucose-Capillary: 114 mg/dL — ABNORMAL HIGH (ref 70–99)
Glucose-Capillary: 147 mg/dL — ABNORMAL HIGH (ref 70–99)

## 2021-03-23 SURGERY — ARTHROPLASTY, KNEE, TOTAL
Anesthesia: Spinal | Site: Knee | Laterality: Left

## 2021-03-23 MED ORDER — BUPIVACAINE-EPINEPHRINE (PF) 0.5% -1:200000 IJ SOLN
INTRAMUSCULAR | Status: AC
  Filled 2021-03-23: qty 30

## 2021-03-23 MED ORDER — LOPERAMIDE HCL 2 MG PO CAPS: 2.0000 mg | ORAL_CAPSULE | ORAL | Status: DC | PRN

## 2021-03-23 MED ORDER — SIMETHICONE 80 MG PO CHEW
80.0000 mg | CHEWABLE_TABLET | Freq: Four times a day (QID) | ORAL | Status: DC | PRN
  Administered 2021-03-26: 80 mg via ORAL
  Filled 2021-03-23 (×3): qty 1

## 2021-03-23 MED ORDER — ONDANSETRON HCL 4 MG/2ML IJ SOLN: 4.0000 mg | Freq: Once | INTRAMUSCULAR | Status: DC | PRN

## 2021-03-23 MED ORDER — FENTANYL CITRATE (PF) 100 MCG/2ML IJ SOLN
INTRAMUSCULAR | Status: AC
  Administered 2021-03-23: 25 ug via INTRAVENOUS
  Filled 2021-03-23: qty 2

## 2021-03-23 MED ORDER — PSYLLIUM 95 % PO PACK
PACK | Freq: Two times a day (BID) | ORAL | Status: DC
  Administered 2021-03-23 – 2021-03-26 (×3): 1 via ORAL
  Filled 2021-03-23 (×9): qty 1

## 2021-03-23 MED ORDER — CLINDAMYCIN PHOSPHATE 900 MG/50ML IV SOLN
INTRAVENOUS | Status: AC
  Filled 2021-03-23: qty 50

## 2021-03-23 MED ORDER — PHENOL 1.4 % MT LIQD
1.0000 | OROMUCOSAL | Status: DC | PRN
  Filled 2021-03-23: qty 177

## 2021-03-23 MED ORDER — PROPOFOL 1000 MG/100ML IV EMUL
INTRAVENOUS | Status: AC
  Filled 2021-03-23: qty 100

## 2021-03-23 MED ORDER — SODIUM CHLORIDE FLUSH 0.9 % IV SOLN
INTRAVENOUS | Status: AC
  Filled 2021-03-23: qty 40

## 2021-03-23 MED ORDER — TRANEXAMIC ACID-NACL 1000-0.7 MG/100ML-% IV SOLN
INTRAVENOUS | Status: AC
  Filled 2021-03-23: qty 100

## 2021-03-23 MED ORDER — ONDANSETRON HCL 4 MG/2ML IJ SOLN
INTRAMUSCULAR | Status: AC
  Filled 2021-03-23: qty 2

## 2021-03-23 MED ORDER — KETOROLAC TROMETHAMINE 15 MG/ML IJ SOLN
7.5000 mg | Freq: Four times a day (QID) | INTRAMUSCULAR | Status: AC
  Administered 2021-03-23 – 2021-03-24 (×4): 7.5 mg via INTRAVENOUS
  Filled 2021-03-23 (×3): qty 1

## 2021-03-23 MED ORDER — FENTANYL CITRATE (PF) 100 MCG/2ML IJ SOLN
25.0000 ug | INTRAMUSCULAR | Status: DC | PRN
  Administered 2021-03-23: 25 ug via INTRAVENOUS

## 2021-03-23 MED ORDER — ACETAMINOPHEN 10 MG/ML IV SOLN
INTRAVENOUS | Status: AC
  Filled 2021-03-23: qty 100

## 2021-03-23 MED ORDER — ONDANSETRON HCL 4 MG PO TABS
4.0000 mg | ORAL_TABLET | Freq: Four times a day (QID) | ORAL | Status: DC | PRN
  Administered 2021-03-23 – 2021-03-24 (×2): 4 mg via ORAL
  Filled 2021-03-23 (×2): qty 1

## 2021-03-23 MED ORDER — BUPIVACAINE LIPOSOME 1.3 % IJ SUSP
INTRAMUSCULAR | Status: AC
  Filled 2021-03-23: qty 20

## 2021-03-23 MED ORDER — FAMOTIDINE 20 MG PO TABS
ORAL_TABLET | ORAL | Status: AC
  Filled 2021-03-23: qty 1

## 2021-03-23 MED ORDER — ACETAMINOPHEN 325 MG PO TABS: 325.0000 mg | ORAL_TABLET | Freq: Four times a day (QID) | ORAL | Status: DC | PRN

## 2021-03-23 MED ORDER — HYDROCODONE-ACETAMINOPHEN 7.5-325 MG PO TABS
1.0000 | ORAL_TABLET | ORAL | Status: DC | PRN
  Administered 2021-03-23: 1 via ORAL
  Administered 2021-03-23: 2 via ORAL
  Administered 2021-03-24: 1 via ORAL
  Administered 2021-03-24: 2 via ORAL
  Filled 2021-03-23 (×3): qty 2
  Filled 2021-03-23: qty 1

## 2021-03-23 MED ORDER — METOCLOPRAMIDE HCL 5 MG/ML IJ SOLN: 5.0000 mg | Freq: Three times a day (TID) | INTRAMUSCULAR | Status: DC | PRN

## 2021-03-23 MED ORDER — ONDANSETRON HCL 4 MG/2ML IJ SOLN: 4.0000 mg | Freq: Four times a day (QID) | INTRAMUSCULAR | Status: DC | PRN

## 2021-03-23 MED ORDER — TRAMADOL HCL 50 MG PO TABS
50.0000 mg | ORAL_TABLET | Freq: Four times a day (QID) | ORAL | Status: DC
  Administered 2021-03-23 – 2021-03-27 (×13): 50 mg via ORAL
  Filled 2021-03-23 (×15): qty 1

## 2021-03-23 MED ORDER — SIMETHICONE 80 MG PO CHEW: 80.0000 mg | CHEWABLE_TABLET | Freq: Four times a day (QID) | ORAL | Status: DC | PRN

## 2021-03-23 MED ORDER — HYDROCORTISONE 2.5 % EX OINT: TOPICAL_OINTMENT | CUTANEOUS | Status: DC | PRN

## 2021-03-23 MED ORDER — MIDAZOLAM HCL 2 MG/2ML IJ SOLN: 1.0000 mg | Freq: Once | INTRAMUSCULAR | Status: AC

## 2021-03-23 MED ORDER — TRANEXAMIC ACID-NACL 1000-0.7 MG/100ML-% IV SOLN
INTRAVENOUS | Status: DC | PRN
Start: 2021-03-23 — End: 2021-03-23
  Administered 2021-03-23: 1000 mg via INTRAVENOUS

## 2021-03-23 MED ORDER — METOCLOPRAMIDE HCL 10 MG PO TABS: 5.0000 mg | ORAL_TABLET | Freq: Three times a day (TID) | ORAL | Status: DC | PRN

## 2021-03-23 MED ORDER — LIDOCAINE HCL (CARDIAC) PF 100 MG/5ML IV SOSY
PREFILLED_SYRINGE | INTRAVENOUS | Status: DC | PRN
  Administered 2021-03-23: 60 mg via INTRAVENOUS

## 2021-03-23 MED ORDER — LEVETIRACETAM 500 MG PO TABS
500.0000 mg | ORAL_TABLET | Freq: Two times a day (BID) | ORAL | Status: DC
  Administered 2021-03-23 – 2021-03-27 (×8): 500 mg via ORAL
  Filled 2021-03-23 (×9): qty 1

## 2021-03-23 MED ORDER — ROPIVACAINE HCL 5 MG/ML IJ SOLN
INTRAMUSCULAR | Status: AC
  Filled 2021-03-23: qty 30

## 2021-03-23 MED ORDER — BUPIVACAINE HCL (PF) 0.5 % IJ SOLN
INTRAMUSCULAR | Status: AC
  Filled 2021-03-23: qty 20

## 2021-03-23 MED ORDER — CLINDAMYCIN PHOSPHATE 900 MG/50ML IV SOLN
900.0000 mg | INTRAVENOUS | Status: AC
  Administered 2021-03-23: 900 mg via INTRAVENOUS

## 2021-03-23 MED ORDER — METFORMIN HCL 500 MG PO TABS
500.0000 mg | ORAL_TABLET | Freq: Every day | ORAL | Status: DC
Start: 2021-03-24 — End: 2021-03-27
  Administered 2021-03-24 – 2021-03-27 (×4): 500 mg via ORAL
  Filled 2021-03-23 (×4): qty 1

## 2021-03-23 MED ORDER — LIDOCAINE HCL (PF) 1 % IJ SOLN
INTRAMUSCULAR | Status: DC | PRN
  Administered 2021-03-23: 3 mL via SUBCUTANEOUS

## 2021-03-23 MED ORDER — LACTATED RINGERS IV SOLN: INTRAVENOUS | Status: DC

## 2021-03-23 MED ORDER — PRONTOSAN WOUND IRRIGATION OPTIME
TOPICAL | Status: DC | PRN
  Administered 2021-03-23: 1 via TOPICAL

## 2021-03-23 MED ORDER — ACETAMINOPHEN 10 MG/ML IV SOLN
INTRAVENOUS | Status: DC | PRN
  Administered 2021-03-23: 1000 mg via INTRAVENOUS

## 2021-03-23 MED ORDER — CHLORHEXIDINE GLUCONATE 0.12 % MT SOLN: 15.0000 mL | Freq: Once | OROMUCOSAL | Status: DC

## 2021-03-23 MED ORDER — KETOROLAC TROMETHAMINE 15 MG/ML IJ SOLN
INTRAMUSCULAR | Status: AC
  Filled 2021-03-23: qty 1

## 2021-03-23 MED ORDER — GLYCOPYRROLATE 0.2 MG/ML IJ SOLN
INTRAMUSCULAR | Status: AC
  Filled 2021-03-23: qty 1

## 2021-03-23 MED ORDER — SODIUM CHLORIDE 0.9 % IV SOLN
INTRAVENOUS | Status: DC | PRN
  Administered 2021-03-23: 60 mL

## 2021-03-23 MED ORDER — NYSTATIN 100000 UNIT/GM EX CREA
1.0000 "application " | TOPICAL_CREAM | Freq: Every day | CUTANEOUS | Status: DC | PRN
  Filled 2021-03-23: qty 15

## 2021-03-23 MED ORDER — ASPIRIN 81 MG PO CHEW
81.0000 mg | CHEWABLE_TABLET | Freq: Two times a day (BID) | ORAL | Status: DC
  Administered 2021-03-23 – 2021-03-27 (×8): 81 mg via ORAL
  Filled 2021-03-23 (×8): qty 1

## 2021-03-23 MED ORDER — BUPIVACAINE-EPINEPHRINE (PF) 0.5% -1:200000 IJ SOLN
INTRAMUSCULAR | Status: DC | PRN
  Administered 2021-03-23: 30 mL via PERINEURAL

## 2021-03-23 MED ORDER — BUPIVACAINE HCL (PF) 0.5 % IJ SOLN
INTRAMUSCULAR | Status: DC | PRN
Start: 2021-03-23 — End: 2021-03-23
  Administered 2021-03-23 (×2): 30 mL via PERINEURAL

## 2021-03-23 MED ORDER — HYDROCODONE-ACETAMINOPHEN 5-325 MG PO TABS
1.0000 | ORAL_TABLET | ORAL | Status: DC | PRN
  Administered 2021-03-25 – 2021-03-26 (×2): 2 via ORAL
  Filled 2021-03-23 (×2): qty 2

## 2021-03-23 MED ORDER — FAMOTIDINE 20 MG PO TABS: 20.0000 mg | ORAL_TABLET | Freq: Once | ORAL | Status: DC

## 2021-03-23 MED ORDER — PROPOFOL 500 MG/50ML IV EMUL
INTRAVENOUS | Status: DC | PRN
  Administered 2021-03-23: 75 ug/kg/min via INTRAVENOUS

## 2021-03-23 MED ORDER — SODIUM CHLORIDE 0.9 % IR SOLN
Status: DC | PRN
  Administered 2021-03-23: 1000 mL

## 2021-03-23 MED ORDER — ALUM & MAG HYDROXIDE-SIMETH 200-200-20 MG/5ML PO SUSP: 30.0000 mL | ORAL | Status: DC | PRN

## 2021-03-23 MED ORDER — METHOCARBAMOL 500 MG PO TABS
500.0000 mg | ORAL_TABLET | Freq: Four times a day (QID) | ORAL | Status: DC | PRN
  Administered 2021-03-23 – 2021-03-24 (×2): 500 mg via ORAL
  Filled 2021-03-23 (×2): qty 1

## 2021-03-23 MED ORDER — MENTHOL 3 MG MT LOZG
1.0000 | LOZENGE | OROMUCOSAL | Status: DC | PRN
  Filled 2021-03-23: qty 9

## 2021-03-23 MED ORDER — METHOCARBAMOL 1000 MG/10ML IJ SOLN
500.0000 mg | Freq: Four times a day (QID) | INTRAVENOUS | Status: DC | PRN
  Filled 2021-03-23: qty 5

## 2021-03-23 MED ORDER — BUPIVACAINE HCL (PF) 0.5 % IJ SOLN
INTRAMUSCULAR | Status: DC | PRN
  Administered 2021-03-23: 2.5 mL

## 2021-03-23 MED ORDER — PROPOFOL 10 MG/ML IV BOLUS
INTRAVENOUS | Status: DC | PRN
  Administered 2021-03-23 (×2): 10 mg via INTRAVENOUS

## 2021-03-23 MED ORDER — DEXMEDETOMIDINE (PRECEDEX) IN NS 20 MCG/5ML (4 MCG/ML) IV SYRINGE
PREFILLED_SYRINGE | INTRAVENOUS | Status: DC | PRN
  Administered 2021-03-23 (×2): 4 ug via INTRAVENOUS
  Administered 2021-03-23: 8 ug via INTRAVENOUS

## 2021-03-23 MED ORDER — POVIDONE-IODINE 10 % EX SWAB: 2.0000 "application " | Freq: Once | CUTANEOUS | Status: DC

## 2021-03-23 MED ORDER — 0.9 % SODIUM CHLORIDE (POUR BTL) OPTIME
TOPICAL | Status: DC | PRN
  Administered 2021-03-23: 500 mL

## 2021-03-23 MED ORDER — SODIUM CHLORIDE 0.9 % IV SOLN: INTRAVENOUS | Status: DC | PRN

## 2021-03-23 MED ORDER — ORAL CARE MOUTH RINSE: 15.0000 mL | Freq: Once | OROMUCOSAL | Status: DC

## 2021-03-23 MED ORDER — TRANEXAMIC ACID-NACL 1000-0.7 MG/100ML-% IV SOLN: 1000.0000 mg | INTRAVENOUS | Status: DC

## 2021-03-23 MED ORDER — LIDOCAINE HCL (PF) 1 % IJ SOLN
INTRAMUSCULAR | Status: AC
  Filled 2021-03-23: qty 5

## 2021-03-23 MED ORDER — BISACODYL 10 MG RE SUPP
10.0000 mg | Freq: Every day | RECTAL | Status: DC | PRN
  Administered 2021-03-26: 10 mg via RECTAL
  Filled 2021-03-23: qty 1

## 2021-03-23 MED ORDER — ATORVASTATIN CALCIUM 20 MG PO TABS
40.0000 mg | ORAL_TABLET | Freq: Every day | ORAL | Status: DC
  Administered 2021-03-23 – 2021-03-26 (×4): 40 mg via ORAL
  Filled 2021-03-23 (×4): qty 2

## 2021-03-23 MED ORDER — PHENYLEPHRINE HCL-NACL 20-0.9 MG/250ML-% IV SOLN
INTRAVENOUS | Status: DC | PRN
  Administered 2021-03-23: 30 ug/min via INTRAVENOUS

## 2021-03-23 MED ORDER — MORPHINE SULFATE (PF) 2 MG/ML IV SOLN: 0.5000 mg | INTRAVENOUS | Status: DC | PRN

## 2021-03-23 MED ORDER — PROPOFOL 10 MG/ML IV BOLUS
INTRAVENOUS | Status: AC
  Filled 2021-03-23: qty 20

## 2021-03-23 MED ORDER — MIDAZOLAM HCL 2 MG/2ML IJ SOLN
INTRAMUSCULAR | Status: AC
  Administered 2021-03-23: 1 mg via INTRAVENOUS
  Filled 2021-03-23: qty 2

## 2021-03-23 MED ORDER — CHLORHEXIDINE GLUCONATE 0.12 % MT SOLN
OROMUCOSAL | Status: AC
  Filled 2021-03-23: qty 15

## 2021-03-23 MED ORDER — DIPHENHYDRAMINE HCL 12.5 MG/5ML PO ELIX: 12.5000 mg | ORAL_SOLUTION | ORAL | Status: DC | PRN

## 2021-03-23 MED ORDER — CLINDAMYCIN PHOSPHATE 600 MG/50ML IV SOLN
600.0000 mg | Freq: Four times a day (QID) | INTRAVENOUS | Status: AC
  Administered 2021-03-23 (×2): 600 mg via INTRAVENOUS
  Filled 2021-03-23 (×2): qty 50

## 2021-03-23 SURGICAL SUPPLY — 65 items
APL PRP STRL LF DISP 70% ISPRP (MISCELLANEOUS) ×2
BASEPLATE TIBIAL LT SZ3 (Knees) ×1 IMPLANT
BLADE SAGITTAL AGGR TOOTH XLG (BLADE) ×2 IMPLANT
BLADE SAW SAG 25X90X1.19 (BLADE) ×2 IMPLANT
BOWL CEMENT MIX W/ADAPTER (MISCELLANEOUS) ×2 IMPLANT
BRUSH SCRUB EZ  4% CHG (MISCELLANEOUS) ×2
BRUSH SCRUB EZ 4% CHG (MISCELLANEOUS) ×1 IMPLANT
BSPLAT TIB 3 CMNT M TPR KN LT (Knees) ×1 IMPLANT
CEMENT BONE 40GM (Cement) ×4 IMPLANT
CHLORAPREP W/TINT 26 (MISCELLANEOUS) ×4 IMPLANT
COMP PATELLA FENESIS 32 OVAL (Stem) ×2 IMPLANT
COMPONENT FEM OXIN SZ 5N LT (Knees) ×1 IMPLANT
COMPONENT PTLLA GENS 32 OVAL (Stem) IMPLANT
COOLER POLAR GLACIER W/PUMP (MISCELLANEOUS) ×2 IMPLANT
CUFF TOURN SGL QUICK 34 (TOURNIQUET CUFF) ×2
CUFF TRNQT CYL 34X4.125X (TOURNIQUET CUFF) ×1 IMPLANT
DRAPE 3/4 80X56 (DRAPES) ×4 IMPLANT
DRAPE EXTREMITY 106X87X128.5 (DRAPES) ×1 IMPLANT
DRAPE INCISE IOBAN 66X60 STRL (DRAPES) ×2 IMPLANT
ELECT REM PT RETURN 9FT ADLT (ELECTROSURGICAL) ×2
ELECTRODE REM PT RTRN 9FT ADLT (ELECTROSURGICAL) ×1 IMPLANT
GAUZE 4X4 16PLY ~~LOC~~+RFID DBL (SPONGE) ×2 IMPLANT
GAUZE SPONGE 4X4 12PLY STRL (GAUZE/BANDAGES/DRESSINGS) ×2 IMPLANT
GAUZE XEROFORM 1X8 LF (GAUZE/BANDAGES/DRESSINGS) ×2 IMPLANT
GLOVE SURG ENC MOIS LTX SZ8 (GLOVE) ×2 IMPLANT
GLOVE SURG ORTHO LTX SZ8 (GLOVE) ×6 IMPLANT
GLOVE SURG SYN 7.0 (GLOVE) ×2 IMPLANT
GLOVE SURG SYN 7.0 PF PI (GLOVE) IMPLANT
GLOVE SURG UNDER LTX SZ8 (GLOVE) ×2 IMPLANT
GLOVE SURG UNDER POLY LF SZ8.5 (GLOVE) ×2 IMPLANT
GOWN STRL REUS W/ TWL LRG LVL3 (GOWN DISPOSABLE) ×1 IMPLANT
GOWN STRL REUS W/ TWL XL LVL3 (GOWN DISPOSABLE) ×1 IMPLANT
GOWN STRL REUS W/TWL LRG LVL3 (GOWN DISPOSABLE) ×2
GOWN STRL REUS W/TWL XL LVL3 (GOWN DISPOSABLE) ×2
INSERT TIB XLPE 9 SZ 3-4 (Knees) ×1 IMPLANT
IV NS IRRIG 3000ML ARTHROMATIC (IV SOLUTION) ×2 IMPLANT
KIT TURNOVER KIT A (KITS) ×2 IMPLANT
MANIFOLD NEPTUNE II (INSTRUMENTS) ×2 IMPLANT
MAT ABSORB  FLUID 56X50 GRAY (MISCELLANEOUS)
MAT ABSORB FLUID 56X50 GRAY (MISCELLANEOUS) ×1 IMPLANT
NDL SAFETY ECLIPSE 18X1.5 (NEEDLE) ×1 IMPLANT
NDL SPNL 20GX3.5 QUINCKE YW (NEEDLE) ×1 IMPLANT
NEEDLE HYPO 18GX1.5 SHARP (NEEDLE) ×2
NEEDLE SPNL 20GX3.5 QUINCKE YW (NEEDLE) ×2 IMPLANT
NS IRRIG 1000ML POUR BTL (IV SOLUTION) ×2 IMPLANT
PACK TOTAL KNEE (MISCELLANEOUS) ×2 IMPLANT
PAD ABD DERMACEA PRESS 5X9 (GAUZE/BANDAGES/DRESSINGS) ×2 IMPLANT
PAD DE MAYO PRESSURE PROTECT (MISCELLANEOUS) ×2 IMPLANT
PAD WRAPON POLAR KNEE (MISCELLANEOUS) ×1 IMPLANT
PULSAVAC PLUS IRRIG FAN TIP (DISPOSABLE) ×2
SOLUTION PRONTOSAN WOUND 350ML (IRRIGATION / IRRIGATOR) ×2 IMPLANT
SPONGE T-LAP 18X18 ~~LOC~~+RFID (SPONGE) ×6 IMPLANT
STAPLER SKIN PROX 35W (STAPLE) ×2 IMPLANT
SUCTION FRAZIER HANDLE 10FR (MISCELLANEOUS) ×2
SUCTION TUBE FRAZIER 10FR DISP (MISCELLANEOUS) ×1 IMPLANT
SUT DVC 2 QUILL PDO  T11 36X36 (SUTURE) ×2
SUT DVC 2 QUILL PDO T11 36X36 (SUTURE) ×1 IMPLANT
SUT VIC AB 2-0 CT1 18 (SUTURE) ×2 IMPLANT
SUT VIC AB 2-0 CT1 27 (SUTURE)
SUT VIC AB 2-0 CT1 TAPERPNT 27 (SUTURE) IMPLANT
SUT VIC AB PLUS 45CM 1-MO-4 (SUTURE) ×2 IMPLANT
SYR 30ML LL (SYRINGE) ×6 IMPLANT
TIP FAN IRRIG PULSAVAC PLUS (DISPOSABLE) ×1 IMPLANT
WATER STERILE IRR 500ML POUR (IV SOLUTION) ×2 IMPLANT
WRAPON POLAR PAD KNEE (MISCELLANEOUS) ×2

## 2021-03-23 NOTE — Plan of Care (Signed)
°  Problem: Education: Goal: Knowledge of General Education information will improve Description: Including pain rating scale, medication(s)/side effects and non-pharmacologic comfort measures Outcome: Progressing   Problem: Health Behavior/Discharge Planning: Goal: Ability to manage health-related needs will improve Outcome: Progressing   Problem: Clinical Measurements: Goal: Ability to maintain clinical measurements within normal limits will improve Outcome: Progressing Goal: Will remain free from infection Outcome: Progressing Goal: Diagnostic test results will improve Outcome: Progressing Goal: Respiratory complications will improve Outcome: Progressing Goal: Cardiovascular complication will be avoided Outcome: Progressing   Problem: Activity: Goal: Risk for activity intolerance will decrease Outcome: Progressing   Problem: Nutrition: Goal: Adequate nutrition will be maintained Outcome: Progressing   Problem: Coping: Goal: Level of anxiety will decrease Outcome: Progressing   Problem: Elimination: Goal: Will not experience complications related to bowel motility Outcome: Progressing Goal: Will not experience complications related to urinary retention Outcome: Progressing   Problem: Pain Managment: Goal: General experience of comfort will improve Outcome: Progressing   Problem: Safety: Goal: Ability to remain free from injury will improve Outcome: Progressing   Problem: Education: Goal: Knowledge of the prescribed therapeutic regimen will improve Outcome: Progressing Goal: Individualized Educational Video(s) Outcome: Progressing   Problem: Activity: Goal: Ability to avoid complications of mobility impairment will improve Outcome: Progressing Goal: Range of joint motion will improve Outcome: Progressing   Problem: Clinical Measurements: Goal: Postoperative complications will be avoided or minimized Outcome: Progressing   Problem: Skin Integrity: Goal:  Will show signs of wound healing Outcome: Progressing

## 2021-03-23 NOTE — Progress Notes (Signed)
Patient awake/alert x4. Only c/o LUE discomfort 5/10. Patient had pulse ox on ring finger of left hand, removed and placed on right hand. H/o breast CA Patient states hand up to shoulder "hurting" LUE warm to touch, pulse +2 radial pulse.  Dr. Andree Elk made aware, believe r/t positioning. Will continue to monitor closely.

## 2021-03-23 NOTE — Transfer of Care (Signed)
Immediate Anesthesia Transfer of Care Note  [name]  Procedure(s) Performed: TOTAL KNEE ARTHROPLASTY (Left: Knee)  Patient Location: PACU  Anesthesia Type:General  Level of Consciousness: awake  Airway & Oxygen Therapy: Patient Spontanous Breathing  Post-op Assessment: Report given to RN and Post -op Vital signs reviewed and stable  Post vital signs: Reviewed and stable  Last Vitals:  Vitals Value Taken Time  BP 96/63 03/23/21 1215  Temp    Pulse 67 03/23/21 1216  Resp 16 03/23/21 1216  SpO2 95 % 03/23/21 1216  Vitals shown include unvalidated device data.  Last Pain:  Vitals:   03/23/21 0819  PainSc: 0-No pain         Complications: No notable events documented.

## 2021-03-23 NOTE — Progress Notes (Signed)
Patient states discomfort in left hand in thumb area. Anesthesia aware, will continue to elevate and monitor.

## 2021-03-23 NOTE — Anesthesia Procedure Notes (Signed)
Spinal  Patient location during procedure: OR Start time: 03/23/2021 10:10 AM End time: 03/23/2021 10:17 AM Reason for block: surgical anesthesia Staffing Performed: resident/CRNA  Anesthesiologist: Molli Barrows, MD Resident/CRNA: Jerrye Noble, CRNA Preanesthetic Checklist Completed: patient identified, IV checked, site marked, risks and benefits discussed, surgical consent, monitors and equipment checked, pre-op evaluation and timeout performed Spinal Block Patient position: sitting Prep: ChloraPrep Patient monitoring: heart rate, continuous pulse ox, blood pressure and cardiac monitor Approach: midline Location: L3-4 Injection technique: single-shot Needle Needle type: Pencan  Needle gauge: 24 G Needle length: 9 cm Assessment Sensory level: T10 Events: CSF return Additional Notes Negative paresthesia. Negative blood return. Positive free-flowing CSF. Expiration date of kit checked and confirmed. Patient tolerated procedure well, without complications.

## 2021-03-23 NOTE — Op Note (Signed)
DATE OF SURGERY:  03/23/2021 TIME: 12:22 PM  PATIENT NAME:  Elizabeth Trujillo   AGE: 86 y.o.    PRE-OPERATIVE DIAGNOSIS:  M17.12 Unilateral primary osteoarthritis, left knee  POST-OPERATIVE DIAGNOSIS:  Same  PROCEDURE:  Procedure(s): TOTAL KNEE ARTHROPLASTY, LEFT  SURGEON:  Lovell Sheehan, MD   ASSISTANT:  Carlynn Spry,  PA-C  OPERATIVE IMPLANTS: Tamala [employer], Cruciate Retaining Oxinium Femoral component size  5 narrow, Fixed Bearing Tray size 3, Patella polyethylene 3-peg oval button size 32 mm, with a 9 mm DISHED insert.   PREOPERATIVE INDICATIONS:  Elizabeth Trujillo is an 86 y.o. female who has a diagnosis of M17.12 Unilateral primary osteoarthritis, left knee and elected for a total knee arthroplasty after failing nonoperative treatment, including activity modification, pain medication, physical therapy and injections who has significant impairment of their activities of daily living.  Radiographs have demonstrated tricompartmental osteoarthritis joint space narrowing, osteophytes, subchondral sclerosis and cyst formation.  The risks, benefits, and alternatives were discussed at length including but not limited to the risks of infection, bleeding, nerve or blood vessel injury, knee stiffness, fracture, dislocation, loosening or failure of the hardware and the need for further surgery. Medical risks include but not limited to DVT and pulmonary embolism, myocardial infarction, stroke, pneumonia, respiratory failure and death. I discussed these risks with the patient in my office prior to the date of surgery. They understood these risks and were willing to proceed.  OPERATIVE FINDINGS AND UNIQUE ASPECTS OF THE CASE:  All three compartments with advanced and severe degenerative changes, large osteophytes and an abundance of synovial fluid. Significant deformity was also noted. A decision was made to proceed with total knee arthroplasty.   OPERATIVE DESCRIPTION:  The patient was brought  to the operative room and placed in a supine position after undergoing placement of a general anesthetic. IV antibiotics were given. Patient received tranexamic acid. The lower extremity was prepped and draped in the usual sterile fashion.  A time out was performed to verify the patient's name, date of birth, medical record number, correct site of surgery and correct procedure to be performed. The timeout was also used to confirm the patient received antibiotics and that appropriate instruments, implants and radiographs studies were available in the room.  The leg was elevated and exsanguinated with an Esmarch and the tourniquet was inflated to 250 mmHg.  A midline incision was made over the left knee.. A medial parapatellar arthrotomy was then made and the patella subluxed laterally and the knee was brought into 90 of flexion. Hoffa's fat pad along with the anterior cruciate ligament was resected and the medial joint line was exposed.  Attention was then turned to preparation of the patella. The thickness of the patella was measured with a caliper, the diameter measured with the patella templates.  The patella resection was then made with an oscillating saw using the patella cutting guide.  The 32 mm button fit appropriately.  3 peg holes for the patella component were then drilled.  The extramedullary tibial cutting guide was then placed using the anterior tibial crest and second ray of the foot as a reference.  The tibial cutting guide was adjusted to allow for appropriate posterior slope.  The tibial cutting block was pinned into position. The slotted stylus was used to measure the proximal tibial resection of 9 mm off the high lateral side. Care was taken during the tibial resection to protect the medial and collateral ligaments.  The resected tibial bone was  removed.  The distal femur was resected using the intramedullary cutting guide.  Care was taken to protect the collateral ligaments during distal  femoral resection.  The distal femoral resection was performed with an oscillating saw. The femoral cutting guide was then removed. Extension gap was measured with a 9 mm spacer block and alignment and extension was confirmed using a long alignment rod. The femur was sized to be a 5 narrow. Rotation of the referencing guide was checked with the epicondylar axis and Whitesides line. Then the 4-in-1 cutting jig was then applied to the distal femur. A stylus was used to confirm that the anterior femur would not be notched.   Then the anterior, posterior and chamfer femoral cuts were then made with an oscillating saw.  The knee was distracted and all posterior osteophytes were removed.  The flexion gap was then measured with a flexion spacer block and long alignment rod and was found to be symmetric with the extension gap and perpendicular to mechanical axis of the tibia.  The proximal tibia plateau was then sized with trial trays. The best coverage was achieved with a size 3. This tibial tray was then pinned into position. The proximal tibia was then prepared with the keel punch.  After tibial preparation was completed, all trial components were inserted with polyethylene trials. The knee achieved full extension and flexed to 120 degrees. Ligament were stable to varus and valgus at full extension as well as 30, 60 and 90 degrees of flexion.   The trials were then placed. Knee was taken through a full range of motion and deemed to be stable with the trial components. All trial components were then removed.  The joint was copiously irrigated with pulse lavage.  The final total knee arthroplasty components were then cemented into place. The knee was held in extension while cement was allowed to cure.The knee was taken through a range of motion and the patella tracked well and the knee was again irrigated copiously.  The knee capsule was then injected with Exparel.  The medial arthrotomy was closed with #1 Vicryl  and #2 Quill. The subcutaneous tissue closed with  2-0 vicryl, and skin approximated with staples.  A dry sterile and compressive dressing was applied.  A Polar Care was applied to the operative knee.  The patient was awakened and brought to the PACU in stable and satisfactory condition.  All sharp, lap and instrument counts were correct at the conclusion the case. I spoke with the patient's family in the postop consultation room to let them know the case had been performed without complication and the patient was stable in recovery room.   Total tourniquet time was 52 minutes.

## 2021-03-23 NOTE — Anesthesia Preprocedure Evaluation (Signed)
Anesthesia Evaluation  Patient identified by MRN, date of birth, ID band Patient awake    Reviewed: Allergy & Precautions, H&P , NPO status , Patient's Chart, lab work & pertinent test results, reviewed documented beta blocker date and time   Airway Mallampati: II   Neck ROM: full    Dental  (+) Poor Dentition   Pulmonary neg pulmonary ROS,    Pulmonary exam normal        Cardiovascular Exercise Tolerance: Poor hypertension, On Medications negative cardio ROS Normal cardiovascular exam Rhythm:regular Rate:Normal     Neuro/Psych Seizures -,  TIACVA, No Residual Symptoms negative psych ROS   GI/Hepatic Neg liver ROS, GERD  Medicated,  Endo/Other  negative endocrine ROSdiabetes  Renal/GU negative Renal ROS  negative genitourinary   Musculoskeletal   Abdominal   Peds  Hematology  (+) Blood dyscrasia, anemia ,   Anesthesia Other Findings Past Medical History: No date: Anemia No date: Arthritis 1986: Breast cancer ([place])     Comment:  left mastectomy 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     Comment:  BCG tx per pt No date: Collagen vascular disease ([place])     Comment:  RA  No date: Diabetes mellitus without complication (HCC) No date: Elevated triglycerides with high cholesterol No date: Family history of bladder cancer No date: Family history of breast cancer No date: Family history of lung cancer No date: GERD (gastroesophageal reflux disease)     Comment:  history of reflux No date: History of cancer of ureter No date: Hypercholesterolemia No date: Hypertension     Comment:  history of  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)     Comment:  TIA Past Surgical History: 09/1984: ABDOMINAL HYSTERECTOMY     Comment:  TAH/BSO 11/11/2018: BIOPSY; Left     Comment:   Procedure: BIOPSY;  Surgeon: Hollice Espy, MD;                Location: [facility] ORS;  Service: Urology;  Laterality: Left; 01/23/2013: BREAST BIOPSY; Right     Comment:  [place] bx/clip-neg 07/1984: BREAST SURGERY; Left     Comment:  Mastectomy No date: callus removed from left toe 2015: CATARACT EXTRACTION W/ INTRAOCULAR LENS IMPLANT; Left No date: COLONOSCOPY 02/24/2019: CYSTOSCOPY W/ RETROGRADES; Left     Comment:  Procedure: CYSTOSCOPY WITH RETROGRADE PYELOGRAM;                Surgeon: Hollice Espy, MD;  Location: [facility] ORS;                Service: Urology;  Laterality: Left; 02/24/2019: CYSTOSCOPY WITH BIOPSY; N/A     Comment:  Procedure: CYSTOSCOPY WITH Bladder BIOPSY;  Surgeon:               Hollice Espy, MD;  Location: [facility] ORS;  Service:               Urology;  Laterality: N/A; 02/24/2019: CYSTOSCOPY WITH FULGERATION; N/A     Comment:  Procedure: CYSTOSCOPY WITH FULGERATION;  Surgeon:               Hollice Espy, MD;  Location: [facility] ORS;  Service:               Urology;  Laterality: N/A; 11/24/2019: CYSTOSCOPY WITH RETROGRADE PYELOGRAM, URETEROSCOPY AND  STENT PLACEMENT; Left     Comment:  Procedure: CYSTOSCOPY WITH RETROGRADE PYELOGRAM,               URETEROSCOPY;  Surgeon: Hollice Espy, MD;  Location:               [facility] ORS;  Service: Urology;  Laterality: Left; 10/14/2018: CYSTOSCOPY WITH URETEROSCOPY AND STENT PLACEMENT; Left     Comment:  Procedure: CYSTOSCOPY WITH URETEROSCOPY AND STENT               PLACEMENT;  Surgeon: Hollice Espy, MD;  Location: [facility]              ORS;  Service: Urology;  Laterality: Left; 02/24/2019: CYSTOSCOPY WITH URETEROSCOPY AND STENT PLACEMENT; Left     Comment:  Procedure: CYSTOSCOPY WITH URETEROSCOPY AND STENT               Exchange;  Surgeon: Hollice Espy, MD;  Location: [facility]               ORS;  Service: Urology;  Laterality: Left; 11/11/2018: CYSTOSCOPY/URETEROSCOPY/HOLMIUM LASER/STENT PLACEMENT;  Left     Comment:   Procedure: CYSTOSCOPY/URETEROSCOPY//STENT EXCHANGE;                Surgeon: Hollice Espy, MD;  Location: [facility] ORS;                Service: Urology;  Laterality: Left; 2013: EYE SURGERY; Left 04/2013: FOOT SURGERY; Left 10/14/2018: HOLMIUM LASER APPLICATION; Left     Comment:  Procedure: HOLMIUM LASER APPLICATION;  Surgeon: Hollice Espy, MD;  Location: [facility] ORS;  Service: Urology;                Laterality: Left; No date: JOINT REPLACEMENT 11/03/2015: KNEE ARTHROPLASTY; Right     Comment:  Procedure: COMPUTER ASSISTED TOTAL KNEE ARTHROPLASTY;                Surgeon: Dereck Leep, MD;  Location: [facility] ORS;                Service: Orthopedics;  Laterality: Right; 1986: MASTECTOMY; Left     Comment:  positive 10/14/2018: URETERAL BIOPSY; Left     Comment:  Procedure: URETERAL BIOPSY;  Surgeon: Hollice Espy,               MD;  Location: [facility] ORS;  Service: Urology;  Laterality:               Left; 02/24/2019: URETERAL BIOPSY; Left     Comment:  Procedure: URETERAL BIOPSY;  Surgeon: Hollice Espy,               MD;  Location: [facility] ORS;  Service: Urology;  Laterality:               Left; BMI    Body Mass Index: 35.75 kg/m     Reproductive/Obstetrics negative OB ROS                             Anesthesia Physical Anesthesia Plan  ASA: 3  Anesthesia Plan: Spinal   Post-op Pain Management: Regional block   Induction:   PONV Risk Score and Plan:   Airway Management Planned:   Additional Equipment:   Intra-op Plan:   Post-operative Plan:   Informed Consent: I have reviewed the patients History and Physical, chart, labs and  discussed the procedure including the risks, benefits and alternatives for the proposed anesthesia with the patient or authorized representative who has indicated his/her understanding and acceptance.     Dental Advisory Given  Plan Discussed with: CRNA  Anesthesia Plan Comments:          Anesthesia Quick Evaluation

## 2021-03-23 NOTE — Progress Notes (Signed)
Patient states she does not want anything to help with constipation. She does have some chronic diarrhea and does not want to take anything that will increase this.

## 2021-03-23 NOTE — Anesthesia Postprocedure Evaluation (Signed)
Anesthesia Post Note  [name]  Procedure(s) Performed: TOTAL KNEE ARTHROPLASTY (Left: Knee)  Patient location during evaluation: PACU Anesthesia Type: Spinal Level of consciousness: awake and alert Pain management: pain level controlled Vital Signs Assessment: post-procedure vital signs reviewed and stable Respiratory status: spontaneous breathing, nonlabored ventilation, respiratory function stable and patient connected to nasal cannula oxygen Cardiovascular status: blood pressure returned to baseline and stable Postop Assessment: no apparent nausea or vomiting Anesthetic complications: no   No notable events documented.   Last Vitals:  Vitals:   03/23/21 1218 03/23/21 1230  BP:  102/66  Pulse: 65 68  Resp: (!) 22 15  Temp:  36.6 C  SpO2: 94% 94%    Last Pain:  Vitals:   03/23/21 1230  PainSc: 0-No pain                 Molli Barrows

## 2021-03-23 NOTE — Progress Notes (Signed)
Patient transferred to room 141. Patient is awake/alert x4. Able to move bil lower ext. Pulses intact. Please note patient had been c/o discomfort to left hand/thumb area, believed r/t pulse ox on finger while in OR.  Upon transfer patient texting with left and right hand, no c/o's pain. Is hard of hearing but does not wear her hearing aids. Family:  husband updated about room 141. Report given to [facility] RN  1 A

## 2021-03-23 NOTE — Anesthesia Procedure Notes (Signed)
Anesthesia Regional Block: Adductor canal block   Pre-Anesthetic Checklist: , timeout performed,  Correct Patient, Correct Site, Correct Laterality,  Correct Procedure, Correct Position, site marked,  Risks and benefits discussed,  Surgical consent,  Pre-op evaluation,  At surgeon's request and post-op pain management  Laterality: Lower and Left  Prep: chloraprep       Needles:  Injection technique: Single-shot  Needle Type: Echogenic Stimulator Needle     Needle Length: 9cm  Needle Gauge: 20     Additional Needles:   Procedures:,,,, ultrasound used (permanent image in chart),,    Narrative:  Start time: 03/23/2021 9:05 AM End time: 03/23/2021 9:09 AM Injection made incrementally with aspirations every 5 mL.  Performed by: Personally  Anesthesiologist: Piscitello, Precious Haws, MD  Additional Notes: Patient consented for risk and benefits of nerve block including but not limited to nerve damage, failed block, bleeding and infection.  Patient voiced understanding.  Functioning IV was confirmed and monitors were applied.  Timeout done prior to procedure and prior to any sedation being given to the patient.  Patient confirmed procedure site prior to any sedation given to the patient.  needle was used. Sterile prep,hand hygiene and sterile gloves were used.  Minimal sedation used for procedure.  A transient  paresthesia endorsed by patient during the procedure. This resolved spontaneously.  Negative aspiration and negative test dose prior to incremental administration of local anesthetic. The patient tolerated the procedure well with no immediate complications.

## 2021-03-23 NOTE — H&P (Signed)
The patient has been re-examined, and the chart reviewed, and there have been no interval changes to the documented history and physical.  Plan a left total knee today.  Anesthesia is consulted regarding a peripheral nerve block for post-operative pain.  The risks, benefits, and alternatives have been discussed at length, and the patient is willing to proceed.     

## 2021-03-23 NOTE — Evaluation (Signed)
Physical Therapy Evaluation Patient Details Name: Elizabeth Trujillo MRN: [id number] DOB: 1935-04-18 Today's Date: 03/23/2021  History of Present Illness  admitted for acute hospitalization s/p L TKR, WBAT (03/23/21)  Clinical Impression  Patient resting in bed upon arrival to room; alert and oriented, follows commands and agreeable to participation with session with min encouragement.  Multiple questions about rehab-equipment, progression, goals, discharge options/ate-answered questions to best of ability within scope; referred to physician for continued discussion of discharge date/location. Patient rating pain in L knee 3-4/10 at rest, 5/10 with movement and WBing; meds requested and received during session.  Demonstrates fair strength (3-/5) and ROM (10-70 degrees) to L knee; generally guarded and limited by pain.  Very pain avoidant in behavior and movement.  Currently requiring min assist for bed mobility; min assist for sit/stand and bed/chair transfer with RW.  Heavy reliance on UE support; unable to achieve full L TKE in loading phase of gait.  Very short, shuffling steps with transfer; limited foot clearance (often preferring to slide/scoot LEs versus step).  Will continue to progress gait in subsequent sessions as pain allows. Would benefit from skilled PT to address above deficits and promote optimal return to PLOF.; Recommend transition to HHPT upon discharge from acute hospitalization.      Recommendations for follow up therapy are one component of a multi-disciplinary discharge planning process, led by the attending physician.  Recommendations may be updated based on patient status, additional functional criteria and insurance authorization.  Follow Up Recommendations Home health PT    Assistance Recommended at Discharge PRN  Patient can return home with the following  A little help with walking and/or transfers;A little help with bathing/dressing/bathroom;Assistance with  cooking/housework;Help with stairs or ramp for entrance    Equipment Recommendations Rolling walker (2 wheels);BSC/3in1  Recommendations for Other Services       Functional Status Assessment Patient has had a recent decline in their functional status and demonstrates the ability to make significant improvements in function in a reasonable and predictable amount of time.     Precautions / Restrictions Precautions Precautions: Fall Restrictions Weight Bearing Restrictions: Yes LLE Weight Bearing: Weight bearing as tolerated Other Position/Activity Restrictions: confirmed via secure chat with Dr. Harlow Mares      Mobility  Bed Mobility Overal bed mobility: Needs Assistance Bed Mobility: Supine to Sit     Supine to sit: Min assist          Transfers Overall transfer level: Needs assistance Equipment used: Rolling walker (2 wheels) Transfers: Sit to/from Stand;Bed to chair/wheelchair/BSC Sit to Stand: Min assist Stand pivot transfers: Min assist         General transfer comment: cuing for hand, L LE placement with movement transition; heavy reliance on R LE for lift off; very slow and guarded, fearful of movement and sensitive to changes in pain    Ambulation/Gait               General Gait Details: deferred due to pain with transfers and [facility]  Stairs            Wheelchair Mobility    Modified Rankin (Stroke Patients Only)       Balance Overall balance assessment: Needs assistance Sitting-balance support: No upper extremity supported;Feet supported Sitting balance-Leahy Scale: Good     Standing balance support: Bilateral upper extremity supported Standing balance-Leahy Scale: Fair  Pertinent Vitals/Pain Pain Assessment: 0-10 Pain Score: 5  Pain Location: L knee Pain Descriptors / Indicators: Aching;Grimacing;Guarding Pain Intervention(s): Limited activity within patient's tolerance;Monitored during  session;Repositioned;Patient requesting pain meds-RN notified;RN gave pain meds during session    Home Living Family/patient expects to be discharged to:: Private residence Living Arrangements: Spouse/significant other Available Help at Discharge: Family;Available PRN/intermittently Type of Home: House Home Access: Ramped entrance       Home Layout: One level Home Equipment: Conservation officer, nature (2 wheels);Shower seat;Cane - single point;BSC/3in1      Prior Function Prior Level of Function : Independent/Modified Independent             Mobility Comments: Mod indep with ADLs, household ([place]) and community ([place]) mobilization       Hand Dominance        Extremity/Trunk Assessment   Upper Extremity Assessment Upper Extremity Assessment: Overall WFL for tasks assessed    Lower Extremity Assessment Lower Extremity Assessment: Generalized weakness (L knee grossly 3-/5 throughout, limited by pain and post-op dressing; otherwise, grossly WFL.  Full sensory return endorsed.)       Communication   Communication: No difficulties  Cognition Arousal/Alertness: Awake/alert Behavior During Therapy: WFL for tasks assessed/performed Overall Cognitive Status: Within Functional Limits for tasks assessed                                          General Comments      Exercises Total Joint Exercises Goniometric ROM: R knee: 10-70 degrees Other Exercises Other Exercises: Reviewed role of PT and progressive mobility; reviewed WBing restrictions and mobility progression. Patient voiced understanding Other Exercises: Supine L LE therex, 1x10, active ROM: ankle pumps, quad sets, SAQs, heel slides, hip abduct/adduct and SLR.  Fair activation of L quad; generally guarded and cautious with all movement due to pain avoidance.   Assessment/Plan    PT Assessment Patient needs continued PT services  PT Problem List Decreased strength;Decreased range of motion;Decreased activity  tolerance;Decreased balance;Decreased mobility;Decreased knowledge of precautions;Decreased knowledge of use of DME;Decreased safety awareness;Cardiopulmonary status limiting activity;Decreased skin integrity;Pain       PT Treatment Interventions DME instruction;Gait training;Functional mobility training;Therapeutic activities;Therapeutic exercise;Balance training;Patient/family education    PT Goals (Current goals can be found in the Care Plan section)  Acute Rehab PT Goals Patient Stated Goal: to return home PT Goal Formulation: With patient/family Time For Goal Achievement: 04/06/21 Potential to Achieve Goals: Good    Frequency BID     Co-evaluation               AM-PAC PT "6 Clicks" Mobility  Outcome Measure Help needed turning from your back to your side while in a flat bed without using bedrails?: None Help needed moving from lying on your back to sitting on the side of a flat bed without using bedrails?: A Little Help needed moving to and from a bed to a chair (including a wheelchair)?: A Little Help needed standing up from a chair using your arms (e.g., wheelchair or bedside chair)?: A Little Help needed to walk in hospital room?: A Little Help needed climbing 3-5 steps with a railing? : A Lot 6 Click Score: 18    End of Session Equipment Utilized During Treatment: Gait belt Activity Tolerance: Patient tolerated treatment well Patient left: in chair;with call bell/phone within reach;with chair alarm set Nurse Communication: Mobility status PT Visit Diagnosis: Muscle  weakness (generalized) (M62.81);Difficulty in walking, not elsewhere classified (R26.2);Pain Pain - Right/Left: Left Pain - part of body: Knee    Time: 6147-0929 PT Time Calculation (min) (ACUTE ONLY): 54 min   Charges:   PT Evaluation $PT Eval Moderate Complexity: 1 Mod PT Treatments $Therapeutic Exercise: 8-22 mins $Therapeutic Activity: 8-22 mins   Melvyn Hommes H. Owens Shark, PT, DPT, NCS 03/23/21,  4:55 PM [phone]

## 2021-03-24 ENCOUNTER — Encounter: Payer: Self-pay | Admitting: Orthopedic Surgery

## 2021-03-24 LAB — BASIC METABOLIC PANEL
Anion gap: 6 (ref 5–15)
BUN: 11 mg/dL (ref 8–23)
CO2: 25 mmol/L (ref 22–32)
Calcium: 8.3 mg/dL — ABNORMAL LOW (ref 8.9–10.3)
Chloride: 107 mmol/L (ref 98–111)
Creatinine, Ser: 0.56 mg/dL (ref 0.44–1.00)
GFR, Estimated: >60 mL/min (ref >60)
Glucose, Bld: 141 mg/dL — ABNORMAL HIGH (ref 70–99)
Potassium: 3.9 mmol/L (ref 3.5–5.1)
Sodium: 138 mmol/L (ref 135–145)

## 2021-03-24 LAB — CBC
HCT: 37.5 % (ref 36.0–46.0)
Hemoglobin: 12.4 g/dL (ref 12.0–15.0)
MCH: 30.4 pg (ref 26.0–34.0)
MCHC: 33.1 g/dL (ref 30.0–36.0)
MCV: 91.9 fL (ref 80.0–100.0)
Platelets: 251 10*3/uL (ref 150–400)
RBC: 4.08 MIL/uL (ref 3.87–5.11)
RDW: 13.7 % (ref 11.5–15.5)
WBC: 16 10*3/uL — ABNORMAL HIGH (ref 4.0–10.5)
nRBC: 0 % (ref 0.0–0.2)

## 2021-03-24 NOTE — Progress Notes (Signed)
Physical Therapy Treatment Patient Details Name: Elizabeth Trujillo MRN: [id number] DOB: 1935-12-12 Today's Date: 03/24/2021   History of Present Illness admitted for acute hospitalization s/p L TKR, WBAT (03/23/21)    PT Comments    Pt was finishing OT upon arriving. She agrees to PT session and remains motivated and cooperative throughout. Does endorse 8/10 pain. Pain did slightly limited session progression however pt was able to stand and ambulate 100 ft with RW + antalgic step to gait pattern. Author is planning to return this afternoon for BID session. Plan is to perform stairs, ROM, and education with pt's daughters who will be assisting pt/Pt's spouse at DC. She is progressing well overall. At conclusion of session; pt was in recliner, with polar care in place, call bell in reach and towel roll under heel to promote extension.    Recommendations for follow up therapy are one component of a multi-disciplinary discharge planning process, led by the attending physician.  Recommendations may be updated based on patient status, additional functional criteria and insurance authorization.  Follow Up Recommendations  Home health PT     Assistance Recommended at Discharge PRN  Patient can return home with the following A little help with walking and/or transfers;A little help with bathing/dressing/bathroom;Assistance with cooking/housework;Help with stairs or ramp for entrance   Equipment Recommendations  Rolling walker (2 wheels);BSC/3in1       Precautions / Restrictions Precautions Precautions: Fall Restrictions Weight Bearing Restrictions: Yes LLE Weight Bearing: Weight bearing as tolerated     Mobility  Bed Mobility  General bed mobility comments: pt was sitting in BR finishing OT upon arriving    Transfers Overall transfer level: Needs assistance Equipment used: Rolling walker (2 wheels) Transfers: Sit to/from Stand Sit to Stand: Min assist    General transfer comment: Pt  performed STS from BSC(over toilet) with vcs + min assist. pain limiting more so than strength    Ambulation/Gait Ambulation/Gait assistance: Min guard;Supervision Gait Distance (Feet): 100 Feet Assistive device: Rolling walker (2 wheels) Gait Pattern/deviations: Step-to pattern;Antalgic;Trunk flexed Gait velocity: decreased     General Gait Details: Pt was able to ambulate from her BR to RN station and return.   Balance Overall balance assessment: Needs assistance Sitting-balance support: No upper extremity supported;Feet supported Sitting balance-Leahy Scale: Good     Standing balance support: Bilateral upper extremity supported Standing balance-Leahy Scale: Fair Standing balance comment: reliant on BUE support for dynamic balance due to pain moeso mthan balance deficits      Cognition Arousal/Alertness: Awake/alert Behavior During Therapy: WFL for tasks assessed/performed Overall Cognitive Status: Within Functional Limits for tasks assessed      General Comments: Pt is A and O x 4               Pertinent Vitals/Pain Pain Assessment: 0-10 Pain Score: 8  Pain Location: L knee Pain Descriptors / Indicators: Aching;Grimacing;Guarding Pain Intervention(s): Limited activity within patient's tolerance;Monitored during session;Premedicated before session;Repositioned;Ice applied     PT Goals (current goals can now be found in the care plan section) Acute Rehab PT Goals Patient Stated Goal: to return home Progress towards PT goals: Progressing toward goals    Frequency    BID      PT Plan Current plan remains appropriate       AM-PAC PT "6 Clicks" Mobility   Outcome Measure  Help needed turning from your back to your side while in a flat bed without using bedrails?: None Help needed moving from lying on  your back to sitting on the side of a flat bed without using bedrails?: A Little Help needed moving to and from a bed to a chair (including a wheelchair)?: A  Little Help needed standing up from a chair using your arms (e.g., wheelchair or bedside chair)?: A Little Help needed to walk in hospital room?: A Little Help needed climbing 3-5 steps with a railing? : A Little 6 Click Score: 19    End of Session Equipment Utilized During Treatment: Gait belt Activity Tolerance: Patient tolerated treatment well;Patient limited by pain Patient left: in chair;with call bell/phone within reach;with chair alarm set Nurse Communication: Mobility status PT Visit Diagnosis: Muscle weakness (generalized) (M62.81);Difficulty in walking, not elsewhere classified (R26.2);Pain Pain - Right/Left: Left Pain - part of body: Knee     Time: 1025-1050 PT Time Calculation (min) (ACUTE ONLY): 25 min  Charges:  $Gait Training: 8-22 mins $Therapeutic Activity: 8-22 mins                    Julaine Fusi PTA 03/24/21, 11:22 AM

## 2021-03-24 NOTE — Care Management Important Message (Signed)
Important Message  Patient Details  Name: Elizabeth Trujillo MRN: [id number] Date of Birth: [birth year]   Medicare Important Message Given:  N/A - LOS <3 / Initial given by admissions     Dannette Barbara 03/24/2021, 6:10 PM

## 2021-03-24 NOTE — Progress Notes (Signed)
Subjective:  Patient reports pain as moderate.  Patient reports having trouble with pain control.  Nurse reports that she has been getting hydrocodone as scheduled and patient has been pretty sleepy with pain meds.  Patient did well with therapy yesterday PM.  Objective:   VITALS:   Vitals:   03/23/21 1415 03/23/21 1513 03/23/21 2029 03/24/21 0526  BP:  (!) 100/55 127/69 121/62  Pulse: (!) 58 (!) 59 (!) 59 69  Resp: 18 18 18 17   Temp: 97.7 F (36.5 C) 97.8 F (36.6 C) 97.6 F (36.4 C) 97.9 F (36.6 C)  TempSrc:    Oral  SpO2: 97% 100% 97% 99%  Weight:        PHYSICAL EXAM:  Neurologically intact ABD soft Neurovascular intact Sensation intact distally Intact pulses distally Dorsiflexion/Plantar flexion intact Incision: dressing C/D/I No cellulitis present Compartment soft  LABS  Results for orders placed or performed during the hospital encounter of 03/23/21 (from the past 24 hour(s))  Glucose, capillary     Status: Abnormal   Collection Time: 03/23/21  7:59 AM  Result Value Ref Range   Glucose-Capillary 131 (H) 70 - 99 mg/dL  Glucose, capillary     Status: Abnormal   Collection Time: 03/23/21 12:18 PM  Result Value Ref Range   Glucose-Capillary 114 (H) 70 - 99 mg/dL  Glucose, capillary     Status: Abnormal   Collection Time: 03/23/21  5:16 PM  Result Value Ref Range   Glucose-Capillary 147 (H) 70 - 99 mg/dL  CBC     Status: Abnormal   Collection Time: 03/24/21  4:07 AM  Result Value Ref Range   WBC 16.0 (H) 4.0 - 10.5 K/uL   RBC 4.08 3.87 - 5.11 MIL/uL   Hemoglobin 12.4 12.0 - 15.0 g/dL   HCT 37.5 36.0 - 46.0 %   MCV 91.9 80.0 - 100.0 fL   MCH 30.4 26.0 - 34.0 pg   MCHC 33.1 30.0 - 36.0 g/dL   RDW 13.7 11.5 - 15.5 %   Platelets 251 150 - 400 K/uL   nRBC 0.0 0.0 - 0.2 %  Basic metabolic panel     Status: Abnormal   Collection Time: 03/24/21  4:07 AM  Result Value Ref Range   Sodium 138 135 - 145 mmol/L   Potassium 3.9 3.5 - 5.1 mmol/L   Chloride 107  98 - 111 mmol/L   CO2 25 22 - 32 mmol/L   Glucose, Bld 141 (H) 70 - 99 mg/dL   BUN 11 8 - 23 mg/dL   Creatinine, Ser 0.56 0.44 - 1.00 mg/dL   Calcium 8.3 (L) 8.9 - 10.3 mg/dL   GFR, Estimated >60 >60 mL/min   Anion gap 6 5 - 15    DG Knee Left Port  Result Date: 03/23/2021 CLINICAL DATA:  86 year old female status post left total knee replacement. EXAM: PORTABLE LEFT KNEE - 1-2 VIEW COMPARISON:  None. FINDINGS: Postsurgical changes after left total knee replacement. The femoral and tibial components are well seated without evidence of hardware loosening, fracture, or malalignment. Scattered pneumo arthrosis. Soft tissues are otherwise within normal limits. IMPRESSION: Immediate postsurgical changes after left total knee arthroplasty without complicating features. Electronically Signed   By: Ruthann Cancer M.D.   On: 03/23/2021 13:03   [place] OR NERVE BLOCK-IMAGE ONLY [facility])  Result Date: 03/23/2021 There is no interpretation for this exam.  This order is for images obtained during a surgical procedure.  Please See "Surgeries" Tab for more information regarding  the procedure.    Assessment/Plan: 1 Day Post-Op   Principal Problem:   S/P total knee replacement, left   Advance diet Up with therapy Continue pain control with hydrocodone as disscussed with nursing staff and Dr. Harlow Mares Anticipate D/C tomorrow with HHPT   Carlynn Spry , PA-C 03/24/2021, 7:02 AM

## 2021-03-24 NOTE — Progress Notes (Signed)
Physical Therapy Treatment Patient Details Name: Elizabeth Trujillo MRN: [id number] DOB: Mar 31, 1935 Today's Date: 03/24/2021   History of Present Illness admitted for acute hospitalization s/p L TKR, WBAT (03/23/21)    PT Comments    Pt was long sitting in bed with supportive spouse and daughter present. Pt remains very lethargic and Pryor Curia feels it my be due to medications. She needs constant encouragement and cues to stay alert and focused on desired task at hand. Even with falling asleep in bed, c/o 8/10 pain. She was able to exit bed, stand, and ambulate without physical assistance. Pts spouse and daughter will be providing assistance at DC. They were both educated throughout session on positioning, polar care, ROM, and what to expect going forward. Pt has ramp entry into her home. Acute PT will continue to follow and progress as able per current POC. Pt's caregivers (spouse/daughter) are planning for DC home tomorrow. She was able to demonstrate ~ 88 degrees of active knee flexion while sitting EOB. Issued pt HEP handout however pt unable to stay awake to perform. She will continue to benefit from skilled PT at DC to address deficits while assisting pt to maximal independence with ADLs. Pt did have successful urination on toilet during session. I'ly perform peri-care and transfer without assistance.   Recommendations for follow up therapy are one component of a multi-disciplinary discharge planning process, led by the attending physician.  Recommendations may be updated based on patient status, additional functional criteria and insurance authorization.  Follow Up Recommendations  Home health PT     Assistance Recommended at Discharge PRN  Patient can return home with the following A little help with walking and/or transfers;A little help with bathing/dressing/bathroom;Assistance with cooking/housework;Help with stairs or ramp for entrance   Equipment Recommendations  Rolling walker (2  wheels);BSC/3in1       Precautions / Restrictions Precautions Precautions: Fall Restrictions Weight Bearing Restrictions: Yes LLE Weight Bearing: Weight bearing as tolerated     Mobility  Bed Mobility Overal bed mobility: Needs Assistance Bed Mobility: Supine to Sit     Supine to sit: Supervision Sit to supine: Min assist;Min guard   General bed mobility comments: no physical assistance required to exit L side of bed. Did require a little asssistance to return to long sitting from EOB.    Transfers Overall transfer level: Needs assistance Equipment used: Rolling walker (2 wheels) Transfers: Sit to/from Stand Sit to Stand: Supervision           General transfer comment: pt was able to stand from EOB and from BSC(placed over toilet without  difficulty or safety concern. pt is most limited by pain/lethargy throughout session.    Ambulation/Gait Ambulation/Gait assistance: Supervision Gait Distance (Feet): 100 Feet Assistive device: Rolling walker (2 wheels) Gait Pattern/deviations: Step-to pattern;Trunk flexed;Antalgic Gait velocity: decreased     General Gait Details: pt was again able to ambulate to RN station and return. needs vcs for posture correction and keeping eyes open. Pt is lethargic throughout session however no LOB or unstadiness observed.    Balance Overall balance assessment: Needs assistance Sitting-balance support: No upper extremity supported;Feet supported Sitting balance-Leahy Scale: Good     Standing balance support: Bilateral upper extremity supported;During functional activity;Reliant on assistive device for balance Standing balance-Leahy Scale: Fair    Cognition Arousal/Alertness: Awake/alert Behavior During Therapy: WFL for tasks assessed/performed Overall Cognitive Status: Within Functional Limits for tasks assessed    General Comments: Pt is A and O x 4  General Comments General comments (skin integrity, edema, etc.):  Author issued HEP however pt is too lethargic. falls asleep mid sentence when [place] educating pt of proper performance. will readdress in AM session.      Pertinent Vitals/Pain Pain Assessment: 0-10 Pain Score: 8  Pain Location: L knee Pain Descriptors / Indicators: Aching;Grimacing;Guarding Pain Intervention(s): Limited activity within patient's tolerance;Monitored during session;Premedicated before session;Repositioned;Ice applied     PT Goals (current goals can now be found in the care plan section) Acute Rehab PT Goals Patient Stated Goal: to return home Progress towards PT goals: Progressing toward goals    Frequency    BID      PT Plan Current plan remains appropriate       AM-PAC PT "6 Clicks" Mobility   Outcome Measure  Help needed turning from your back to your side while in a flat bed without using bedrails?: None Help needed moving from lying on your back to sitting on the side of a flat bed without using bedrails?: A Little Help needed moving to and from a bed to a chair (including a wheelchair)?: A Little Help needed standing up from a chair using your arms (e.g., wheelchair or bedside chair)?: A Little Help needed to walk in hospital room?: A Little Help needed climbing 3-5 steps with a railing? : A Little 6 Click Score: 19    End of Session Equipment Utilized During Treatment: Gait belt (issued a gait belt for home use) Activity Tolerance: Patient limited by lethargy;Patient limited by pain Patient left: in bed;with call bell/phone within reach;with bed alarm set;with family/visitor present (polar care in place and towel roll under heels) Nurse Communication: Mobility status PT Visit Diagnosis: Muscle weakness (generalized) (M62.81);Difficulty in walking, not elsewhere classified (R26.2);Pain Pain - Right/Left: Left Pain - part of body: Knee     Time: 3716-9678 PT Time Calculation (min) (ACUTE ONLY): 42 min  Charges:  $Gait Training: 8-22  mins $Therapeutic Exercise: 8-22 mins $Therapeutic Activity: 8-22 mins                     Julaine Fusi PTA 03/24/21, 5:35 PM

## 2021-03-24 NOTE — TOC Progression Note (Addendum)
Transition of Care [facility]) - Progression Note    Patient Details  Name: Elizabeth Trujillo MRN: [id number] Date of Birth: 01/23/1936  Transition of Care [facility]) CM/SW [place], RN Phone Number: 03/24/2021, 9:35 AM  Clinical Narrative:   TOC signing off, Set up with [facility] services thru [facility] She lives at home with her husband, She has a ramped entrance, She has a rolling walker, a 3 in 1 and a cane, She has transportation with her husband, She can afford her medication, no additional needs from [facility] She will have a 3 in 1 delivered to the home by [facility]        Expected Discharge Plan and Services                                                 Social Determinants of Health (SDOH) Interventions    Readmission Risk Interventions No flowsheet data found.

## 2021-03-24 NOTE — Evaluation (Signed)
Occupational Therapy Evaluation Patient Details Name: Elizabeth Trujillo MRN: [id number] DOB: May 26, 1935 Today's Date: 03/24/2021   History of Present Illness admitted for acute hospitalization s/p L TKR, WBAT (03/23/21)   Clinical Impression   Patient presenting with decreased Ind in self care, balance, functional mobility/transfers, endurance, and safety awareness. Patient reports living at home with husband and using RW for functional mobility at baseline. Patient currently functioning at min A for LB self care. Min A for functional transfers with use of RW. Pt was able to ambulate 15 feet with RW to bathroom for toilet transfer. OT provided paper handout for further education on self care tasks and polar care. Patient will benefit from acute OT to increase overall independence in the areas of ADLs, functional mobility, and safety awareness in order to safely discharge home with family.      Recommendations for follow up therapy are one component of a multi-disciplinary discharge planning process, led by the attending physician.  Recommendations may be updated based on patient status, additional functional criteria and insurance authorization.   Follow Up Recommendations  No OT follow up    Assistance Recommended at Discharge Frequent or constant Supervision/Assistance  Patient can return home with the following A little help with walking and/or transfers;A little help with bathing/dressing/bathroom    Functional Status Assessment  Patient has had a recent decline in their functional status and demonstrates the ability to make significant improvements in function in a reasonable and predictable amount of time.  Equipment Recommendations  None recommended by OT (Pt has all needed equipment)       Precautions / Restrictions Precautions Precautions: Fall Restrictions Weight Bearing Restrictions: Yes LLE Weight Bearing: Weight bearing as tolerated      Mobility Bed Mobility Overal bed  mobility: Needs Assistance Bed Mobility: Supine to Sit     Supine to sit: Min guard     General bed mobility comments: pt was sitting in BR finishing OT upon arriving    Transfers Overall transfer level: Needs assistance Equipment used: Rolling walker (2 wheels) Transfers: Sit to/from Stand Sit to Stand: Min assist Stand pivot transfers: Min assist         General transfer comment: Pt performed STS from BSC(over toilet) with vcs + min assist. pain limiting more so than strength      Balance Overall balance assessment: Needs assistance Sitting-balance support: No upper extremity supported;Feet supported Sitting balance-Leahy Scale: Good     Standing balance support: Bilateral upper extremity supported;During functional activity;Reliant on assistive device for balance Standing balance-Leahy Scale: Fair Standing balance comment: reliant on BUE support for dynamic balance due to pain moeso mthan balance deficits                           ADL either performed or assessed with clinical judgement   ADL Overall ADL's : Needs assistance/impaired     Grooming: Wash/dry hands;Standing;Min guard               Lower Body Dressing: Minimal assistance   Toilet Transfer: Minimal assistance;Rolling walker (2 wheels);Regular Toilet                   Vision Patient Visual Report: No change from baseline              Pertinent Vitals/Pain Pain Assessment: 0-10 Pain Score: 6  Pain Location: L knee Pain Descriptors / Indicators: Aching;Grimacing;Guarding Pain Intervention(s): Limited activity within patient's tolerance;Monitored during  session;Premedicated before session;Repositioned;Ice applied     Hand Dominance Right   Extremity/Trunk Assessment Upper Extremity Assessment Upper Extremity Assessment: Overall WFL for tasks assessed   Lower Extremity Assessment Lower Extremity Assessment: Generalized weakness       Communication  Communication Communication: No difficulties   Cognition Arousal/Alertness: Awake/alert Behavior During Therapy: WFL for tasks assessed/performed Overall Cognitive Status: Within Functional Limits for tasks assessed                                 General Comments: Pt is A and O x 4                Home Living Family/patient expects to be discharged to:: Private residence Living Arrangements: Spouse/significant other Available Help at Discharge: Family;Available PRN/intermittently Type of Home: House Home Access: Ramped entrance     Home Layout: One level     Bathroom Shower/Tub: Teacher, early years/pre: Handicapped height Bathroom Accessibility: Yes   Home Equipment: Conservation officer, nature (2 wheels);Shower seat;Cane - single point;BSC/3in1          Prior Functioning/Environment Prior Level of Function : Independent/Modified Independent             Mobility Comments: Mod indep with ADLs, household ([place]) and community (RW) mobilization ADLs Comments: Ind with self care tasks        OT Problem List: Decreased strength;Decreased range of motion;Decreased activity tolerance;Impaired balance (sitting and/or standing);Decreased safety awareness;Decreased knowledge of use of DME or AE      OT Treatment/Interventions: Self-care/ADL training;Manual therapy;Therapeutic exercise;Patient/family education;Modalities;Balance training;Energy conservation;Cognitive remediation/compensation;Therapeutic activities;DME and/or AE instruction    OT Goals(Current goals can be found in the care plan section) Acute Rehab OT Goals Patient Stated Goal: to get to bathroom OT Goal Formulation: With patient Time For Goal Achievement: 04/07/21 Potential to Achieve Goals: Good  OT Frequency: Min 2X/week       AM-PAC OT "6 Clicks" Daily Activity     Outcome Measure Help from another person eating meals?: None Help from another person taking care of personal  grooming?: None Help from another person toileting, which includes using toliet, bedpan, or urinal?: A Little Help from another person bathing (including washing, rinsing, drying)?: A Little Help from another person to put on and taking off regular upper body clothing?: None Help from another person to put on and taking off regular lower body clothing?: A Little 6 Click Score: 21   End of Session Equipment Utilized During Treatment: Rolling walker (2 wheels) Nurse Communication: Mobility status  Activity Tolerance: Patient tolerated treatment well Patient left:  (Pt left to transition to PT session)  OT Visit Diagnosis: Unsteadiness on feet (R26.81);Muscle weakness (generalized) (M62.81)                Time: 1000-1026 OT Time Calculation (min): 26 min Charges:  OT General Charges $OT Visit: 1 Visit OT Evaluation $OT Eval Moderate Complexity: 1 Mod OT Treatments $Self Care/Home Management : 8-22 mins  Darleen Crocker, MS, OTR/L , CBIS ascom [phone]  03/24/21, 1:42 PM

## 2021-03-25 ENCOUNTER — Inpatient Hospital Stay: Payer: Medicare Other

## 2021-03-25 LAB — CBC
HCT: 34.1 % — ABNORMAL LOW (ref 36.0–46.0)
Hemoglobin: 11.8 g/dL — ABNORMAL LOW (ref 12.0–15.0)
MCH: 31.6 pg (ref 26.0–34.0)
MCHC: 34.6 g/dL (ref 30.0–36.0)
MCV: 91.2 fL (ref 80.0–100.0)
Platelets: 215 10*3/uL (ref 150–400)
RBC: 3.74 MIL/uL — ABNORMAL LOW (ref 3.87–5.11)
RDW: 13.8 % (ref 11.5–15.5)
WBC: 13.7 10*3/uL — ABNORMAL HIGH (ref 4.0–10.5)
nRBC: 0 % (ref 0.0–0.2)

## 2021-03-25 MED ORDER — HYDROCODONE-ACETAMINOPHEN 5-325 MG PO TABS: 1.0000 | ORAL_TABLET | ORAL | 0 refills | Status: DC | PRN

## 2021-03-25 MED ORDER — METHOCARBAMOL 500 MG PO TABS: 500.0000 mg | ORAL_TABLET | Freq: Four times a day (QID) | ORAL | 1 refills | Status: DC | PRN

## 2021-03-25 MED ORDER — ASPIRIN 81 MG PO CHEW: 81.0000 mg | CHEWABLE_TABLET | Freq: Two times a day (BID) | ORAL | 0 refills | Status: DC

## 2021-03-25 NOTE — Progress Notes (Signed)
Physical Therapy Treatment Patient Details Name: Elizabeth Trujillo MRN: [id number] DOB: 24-Feb-1936 Today's Date: 03/25/2021   History of Present Illness admitted for acute hospitalization s/p L TKR, WBAT (03/23/21)    PT Comments    Very slow and deliberate in all transitional movements and gait efforts.  Fair/good stabilization and control of L LE; constant encouragement for continued mobility and participation with session.  Patient very sensitive to pain and fatigue levels ; somewhat self-limiting at times.    Recommendations for follow up therapy are one component of a multi-disciplinary discharge planning process, led by the attending physician.  Recommendations may be updated based on patient status, additional functional criteria and insurance authorization.  Follow Up Recommendations  Home health PT     Assistance Recommended at Discharge PRN  Patient can return home with the following A little help with walking and/or transfers;A little help with bathing/dressing/bathroom;Assistance with cooking/housework;Help with stairs or ramp for entrance   Equipment Recommendations  Rolling walker (2 wheels);BSC/3in1    Recommendations for Other Services       Precautions / Restrictions Precautions Precautions: Fall Restrictions Weight Bearing Restrictions: Yes LLE Weight Bearing: Weight bearing as tolerated Other Position/Activity Restrictions: confirmed via secure chat with Dr. Harlow Mares     Mobility  Bed Mobility Overal bed mobility: Needs Assistance Bed Mobility: Supine to Sit     Supine to sit: Min assist     General bed mobility comments: assist for L LE management; significant increase in time    Transfers Overall transfer level: Needs assistance Equipment used: Rolling walker (2 wheels) Transfers: Sit to/from Stand Sit to Stand: Min assist Stand pivot transfers: Min assist         General transfer comment: step by step cuing for hand/foot placement; very slow  and deliberate in movement transitions; limited active [place] eof L LE    Ambulation/Gait Ambulation/Gait assistance: Min guard Gait Distance (Feet): 100 Feet Assistive device: Rolling walker (2 wheels)         General Gait Details: partically reciprocal stepping pattern with decreased step height/length, tends to slide versus lift L LE; forward flexed posture, frequent cuing for RW position/placement.  Very slow and effortful; multiple standing rest breaks due to gait distance.  Constant cuing for cadence and overall gait speed.   Stairs             Wheelchair Mobility    Modified Rankin (Stroke Patients Only)       Balance Overall balance assessment: Needs assistance Sitting-balance support: No upper extremity supported;Feet supported Sitting balance-Leahy Scale: Good     Standing balance support: Bilateral upper extremity supported Standing balance-Leahy Scale: Fair                              Cognition Arousal/Alertness: Awake/alert Behavior During Therapy: WFL for tasks assessed/performed Overall Cognitive Status: Within Functional Limits for tasks assessed                                          Exercises Total Joint Exercises Goniometric ROM: L knee: 5-80 degrees Other Exercises Other Exercises: Seated LE therex, 1x10, act assist ROM: ankle pumps, LAQs, marching.  Very guarded in movement Other Exercises: Verbally reviewed car transfer; patient/family voiced understanding and agreement.    General Comments        Pertinent Vitals/Pain Pain  Assessment: Faces Faces Pain Scale: Hurts even more Pain Location: L knee Pain Descriptors / Indicators: Aching;Grimacing;Guarding Pain Intervention(s): Limited activity within patient's tolerance;Monitored during session;Repositioned    Home Living                          Prior Function            PT Goals (current goals can now be found in the care plan section)  Acute Rehab PT Goals Patient Stated Goal: to return home PT Goal Formulation: With patient/family Time For Goal Achievement: 04/06/21 Potential to Achieve Goals: Good Progress towards PT goals: Progressing toward goals    Frequency    BID      PT Plan Current plan remains appropriate    Co-evaluation              AM-PAC PT "6 Clicks" Mobility   Outcome Measure  Help needed turning from your back to your side while in a flat bed without using bedrails?: None Help needed moving from lying on your back to sitting on the side of a flat bed without using bedrails?: A Little Help needed moving to and from a bed to a chair (including a wheelchair)?: A Little Help needed standing up from a chair using your arms (e.g., wheelchair or bedside chair)?: A Little Help needed to walk in hospital room?: A Little Help needed climbing 3-5 steps with a railing? : A Little 6 Click Score: 19    End of Session Equipment Utilized During Treatment: Gait belt Activity Tolerance: Patient limited by lethargy;Patient limited by pain Patient left: in bed;with call bell/phone within reach;with bed alarm set;with family/visitor present Nurse Communication: Mobility status PT Visit Diagnosis: Muscle weakness (generalized) (M62.81);Difficulty in walking, not elsewhere classified (R26.2);Pain Pain - Right/Left: Left Pain - part of body: Knee     Time: 5621-3086 PT Time Calculation (min) (ACUTE ONLY): 43 min  Charges:  $Gait Training: 8-22 mins $Therapeutic Exercise: 8-22 mins $Therapeutic Activity: 8-22 mins                     Mirela Parsley H. Owens Shark, PT, DPT, NCS 03/25/21, 5:11 PM [phone]

## 2021-03-25 NOTE — Progress Notes (Signed)
PT Cancellation Note  Patient Details Name: Elizabeth Trujillo MRN: [id number] DOB: [birth year]   Cancelled Treatment:    Reason Eval/Treat Not Completed: Medical issues which prohibited therapy (Chart reviewed for planned treatment session. Noted orders for pending LE doppler to rule out DVT. Will hold until test complete, results received and patient cleared for activity.)  Sylvia Kondracki H. Owens Shark, PT, DPT, NCS 03/25/21, 9:14 AM [phone]

## 2021-03-25 NOTE — Discharge Instructions (Signed)
Continue weight bear as tolerated on the left lower extremity.    Elevate the left lower extremity whenever possible and continue the polar care while elevating the extremity. Patient may shower. No bath or submerging the wound.    Take aspirin as directed for blood clot prevention.  Continue to work on knee range of motion exercises at home as instructed by physical therapy. Continue to use a walker for assistance with ambulation until cleared by physical therapy.  Call [phone] with any questions, such as fever > 101.5 degrees, drainage from the wound or shortness of breath. 

## 2021-03-25 NOTE — Progress Notes (Signed)
°  Subjective:  Patient reports pain as mild to moderate.  Patient is sleepy but not lethargic.  Objective:   VITALS:   Vitals:   03/24/21 1113 03/24/21 1703 03/24/21 2028 03/25/21 0332  BP: 119/69 (!) 143/68 132/71 (!) 148/71  Pulse: 68 74 79 75  Resp: 18 16 16 16   Temp: 98.1 F (36.7 C) 97.7 F (36.5 C) 98.2 F (36.8 C) (!) 97.5 F (36.4 C)  TempSrc: Oral     SpO2: 96% 97% 95% 98%  Weight:      Height:        PHYSICAL EXAM:  Neurologically intact ABD soft Neurovascular intact Sensation intact distally Intact pulses distally Dorsiflexion/Plantar flexion intact Incision: dressing C/D/I and drsg changed No cellulitis present Compartment soft  LABS  Results for orders placed or performed during the hospital encounter of 03/23/21 (from the past 24 hour(s))  CBC     Status: Abnormal   Collection Time: 03/25/21  5:06 AM  Result Value Ref Range   WBC 13.7 (H) 4.0 - 10.5 K/uL   RBC 3.74 (L) 3.87 - 5.11 MIL/uL   Hemoglobin 11.8 (L) 12.0 - 15.0 g/dL   HCT 34.1 (L) 36.0 - 46.0 %   MCV 91.2 80.0 - 100.0 fL   MCH 31.6 26.0 - 34.0 pg   MCHC 34.6 30.0 - 36.0 g/dL   RDW 13.8 11.5 - 15.5 %   Platelets 215 150 - 400 K/uL   nRBC 0.0 0.0 - 0.2 %    DG Knee Left Port  Result Date: 03/23/2021 CLINICAL DATA:  86 year old female status post left total knee replacement. EXAM: PORTABLE LEFT KNEE - 1-2 VIEW COMPARISON:  None. FINDINGS: Postsurgical changes after left total knee replacement. The femoral and tibial components are well seated without evidence of hardware loosening, fracture, or malalignment. Scattered pneumo arthrosis. Soft tissues are otherwise within normal limits. IMPRESSION: Immediate postsurgical changes after left total knee arthroplasty without complicating features. Electronically Signed   By: Ruthann Cancer M.D.   On: 03/23/2021 13:03   [place] OR NERVE BLOCK-IMAGE ONLY [facility])  Result Date: 03/23/2021 There is no interpretation for this exam.  This order is for images  obtained during a surgical procedure.  Please See "Surgeries" Tab for more information regarding the procedure.    Assessment/Plan: 2 Days Post-Op   Principal Problem:   S/P total knee replacement, left   Advance diet Continue PT Continue pain control Anticipate D/C with HHPT  Carlynn Spry , PA-C 03/25/2021, 8:04 AM

## 2021-03-25 NOTE — Discharge Summary (Signed)
Physician Discharge Summary  Patient ID: [name] MRN: [id number] DOB/AGE: [birth year] 86 y.o.  Admit date: 03/23/2021 Discharge date: 03/25/2021  Admission Diagnoses:  M17.12 Unilateral primary osteoarthritis, left knee S/P total knee replacement, left  Discharge Diagnoses:  M17.12 Unilateral primary osteoarthritis, left knee Principal Problem:   S/P total knee replacement, left   Past Medical History:  Diagnosis Date   Anemia    Arthritis    Breast cancer ([place]) 1986   left mastectomy   carcinoma in situ of urinary bladder 2019   BCG tx per pt   Collagen vascular disease ([place])    RA    Diabetes mellitus without complication (HCC)    Elevated triglycerides with high cholesterol    Family history of bladder cancer    Family history of breast cancer    Family history of lung cancer    GERD (gastroesophageal reflux disease)    history of reflux   History of cancer of ureter    Hypercholesterolemia    Hypertension    history of    Primary ureteral papillary carcinoma, left ([place]) 2019   Surgical resection and BCG  tx per pt   Seizure ([place])    last 08-2018   Stroke [facility])    TIA    Surgeries: Procedure(s): TOTAL KNEE ARTHROPLASTY on 03/23/2021   Consultants (if any):   Discharged Condition: Improved  Hospital Course: Elizabeth Trujillo is an 86 y.o. female who was admitted 03/23/2021 with a diagnosis of  M17.12 Unilateral primary osteoarthritis, left knee S/P total knee replacement, left and went to the operating room on 03/23/2021 and underwent the above named procedures.    She was given perioperative antibiotics:  Anti-infectives (From admission, onward)    Start     Dose/Rate Route Frequency Ordered Stop   03/23/21 1700  clindamycin (CLEOCIN) IVPB 600 mg        600 mg 100 mL/hr over 30 Minutes Intravenous Every 6 hours 03/23/21 1554 03/23/21 2210   03/23/21 0750  clindamycin (CLEOCIN) 900 MG/50ML IVPB       Note to Pharmacy: Lyman Bishop T: cabinet  override      03/23/21 0750 03/23/21 1027   03/23/21 0600  clindamycin (CLEOCIN) IVPB 900 mg        900 mg 100 mL/hr over 30 Minutes Intravenous On call to O.R. 03/23/21 0118 03/23/21 1040     .  She was given sequential compression devices, early ambulation, and aspirin for DVT prophylaxis.  She benefited maximally from the hospital stay and there were no complications.    Recent vital signs:  Vitals:   03/25/21 0332 03/25/21 1313  BP: (!) 148/71 135/62  Pulse: 75 84  Resp: 16 18  Temp: (!) 97.5 F (36.4 C) 98 F (36.7 C)  SpO2: 98% 94%    Recent laboratory studies:  Lab Results  Component Value Date   HGB 11.8 (L) 03/25/2021   HGB 12.4 03/24/2021   HGB 14.5 03/17/2021   Lab Results  Component Value Date   WBC 13.7 (H) 03/25/2021   PLT 215 03/25/2021   Lab Results  Component Value Date   INR 1.00 10/20/2015   Lab Results  Component Value Date   NA 138 03/24/2021   K 3.9 03/24/2021   CL 107 03/24/2021   CO2 25 03/24/2021   BUN 11 03/24/2021   CREATININE 0.56 03/24/2021   GLUCOSE 141 (H) 03/24/2021    Discharge Medications:   Allergies as of 03/25/2021  Reactions   Penicillin V Potassium Hives   Has patient had a PCN reaction causing immediate rash, facial/tongue/throat swelling, SOB or lightheadedness with hypotension: no Has patient had a PCN reaction causing severe rash involving mucus membranes or skin necrosis: {no Has patient had a PCN reaction that required hospitalization no Has patient had a PCN reaction occurring within the last 10 years: no If all of the above answers are "NO", then may proceed with Cephalosporin use.   Sulfa Antibiotics Hives        Medication List     STOP taking these medications    aspirin EC 81 MG tablet Replaced by: aspirin 81 MG chewable tablet       TAKE these medications    acetaminophen 500 MG tablet Commonly known as: TYLENOL Take 1,000 mg by mouth every 6 (six) hours as needed for moderate  pain (pain.).   aspirin 81 MG chewable tablet Chew 1 tablet (81 mg total) by mouth 2 (two) times daily. Replaces: aspirin EC 81 MG tablet   atorvastatin 40 MG tablet Commonly known as: LIPITOR Take 1 tablet by mouth once daily   AZO DINE PO Take 100 mg by mouth as needed (dysuria).   CALCIUM+D3 PO Take 1 tablet by mouth daily.   cholecalciferol 25 MCG (1000 UNIT) tablet Commonly known as: VITAMIN D Take 1,000 Units by mouth at bedtime.   HYDROcodone-acetaminophen 5-325 MG tablet Commonly known as: NORCO/VICODIN Take 1 tablet by mouth every 4 (four) hours as needed for moderate pain (pain).   hydrocortisone 2.5 % ointment Apply topically as needed (rash; hemorrhoids).   ketoconazole 2 % shampoo Commonly known as: NIZORAL Apply 1 application topically as needed (flaky/itchy scalp). Up to once per week   levETIRAcetam 500 MG tablet Commonly known as: KEPPRA Take 500 mg by mouth 2 (two) times daily.   loperamide 2 MG capsule Commonly known as: IMODIUM Take 2 mg by mouth as needed for diarrhea or loose stools.   metFORMIN 500 MG tablet Commonly known as: GLUCOPHAGE Take 1 tablet by mouth once daily with breakfast   methocarbamol 500 MG tablet Commonly known as: ROBAXIN Take 1 tablet (500 mg total) by mouth every 6 (six) hours as needed for muscle spasms.   multivitamin with minerals Tabs tablet Take 1 tablet by mouth daily at 2 PM.   nitrofurantoin (macrocrystal-monohydrate) 100 MG capsule Commonly known as: MACROBID Take 1 capsule (100 mg total) by mouth every 12 (twelve) hours. Take 1 capsule (100 mg total) by mouth every 12 (twelve) hours. Start this medication 3 days before your scheduled cystoscopy which is on a Friday.  This means that you should start this medication on Tuesday and then complete the course for a total of 5 days. What changed: additional instructions   nystatin cream Commonly known as: MYCOSTATIN Apply 1 application topically daily as needed  for dry skin (fungal infection).   psyllium 28 % packet Commonly known as: METAMUCIL SMOOTH TEXTURE Take 1 packet by mouth 2 (two) times daily. 2 -3 tablespoons a day   simethicone 80 MG chewable tablet Commonly known as: MYLICON Chew 80 mg by mouth every 6 (six) hours as needed for flatulence.   simethicone 125 MG chewable tablet Commonly known as: MYLICON Chew 449 mg by mouth every 6 (six) hours as needed for flatulence (take 2 tablets).               Durable Medical Equipment  (From admission, onward)  Start     Ordered   03/25/21 1342  For home use only DME 3 n 1  Once        03/25/21 1341   03/25/21 1342  For home use only DME Walker rolling  Once       Question Answer Comment  Walker: With 5 Inch Wheels   Patient needs a walker to treat with the following condition S/P total knee arthroplasty      03/25/21 1341            Diagnostic Studies: DG Knee Left Port  Result Date: 03/23/2021 CLINICAL DATA:  86 year old female status post left total knee replacement. EXAM: PORTABLE LEFT KNEE - 1-2 VIEW COMPARISON:  None. FINDINGS: Postsurgical changes after left total knee replacement. The femoral and tibial components are well seated without evidence of hardware loosening, fracture, or malalignment. Scattered pneumo arthrosis. Soft tissues are otherwise within normal limits. IMPRESSION: Immediate postsurgical changes after left total knee arthroplasty without complicating features. Electronically Signed   By: Ruthann Cancer M.D.   On: 03/23/2021 13:03   [place] OR NERVE BLOCK-IMAGE ONLY [facility])  Result Date: 03/23/2021 There is no interpretation for this exam.  This order is for images obtained during a surgical procedure.  Please See "Surgeries" Tab for more information regarding the procedure.    Disposition: Discharge disposition: 01-Home or Self Care            Signed: Carlynn Spry ,PA-C 03/25/2021, 1:42 PM

## 2021-03-26 NOTE — Progress Notes (Signed)
Physical Therapy Treatment Patient Details Name: Elizabeth Trujillo MRN: [id number] DOB: [birth year] Today's Date: 03/26/2021   History of Present Illness admitted for acute hospitalization s/p L TKR, WBAT (03/23/21)    PT Comments    Pt seen this am, remains very anxious about returning home today since she has not been able to mobilize enough and has not had a BM as of this visit.  Pt has improved with transfers from chair/bed/commode, only requiring supervision. Gait training with RW 53ft x 1 5ft x 2 with supervision and cues to increase L heel strike and knee flexion.  Pt currently with 5-85 L knee AAROM.  Pt has improved functionally since yesterday's session, however remains apprehensive about possible d/c this afternoon.  Will plan to reassess after lunch while husband is visiting.    Recommendations for follow up therapy are one component of a multi-disciplinary discharge planning process, led by the attending physician.  Recommendations may be updated based on patient status, additional functional criteria and insurance authorization.  Follow Up Recommendations        Assistance Recommended at Discharge PRN  Patient can return home with the following A little help with walking and/or transfers;A little help with bathing/dressing/bathroom;Assistance with cooking/housework;Help with stairs or ramp for entrance   Equipment Recommendations  Rolling walker (2 wheels);BSC/3in1    Recommendations for Other Services       Precautions / Restrictions Precautions Precautions: Fall Restrictions Weight Bearing Restrictions: Yes LLE Weight Bearing: Weight bearing as tolerated Other Position/Activity Restrictions: confirmed via secure chat with Dr. Harlow Mares     Mobility  Bed Mobility Overal bed mobility: Needs Assistance Bed Mobility: Supine to Sit     Supine to sit: Min guard (For L LE in case of lost control) Sit to supine: Min guard   General bed mobility comments: assist for L LE  management; significant increase in time    Transfers Overall transfer level: Needs assistance Equipment used: Rolling walker (2 wheels) Transfers: Sit to/from Stand Sit to Stand: Supervision           General transfer comment:  (Improved transfer technique, continues to require increased time)    Ambulation/Gait Ambulation/Gait assistance: Supervision Gait Distance (Feet):  (90) Assistive device: Rolling walker (2 wheels) Gait Pattern/deviations: Step-to pattern;Trunk flexed;Antalgic Gait velocity: decreased     General Gait Details:  (Slow and cautious. No significant knee flexion noted, pt guarding L knee despite vc's)   Stairs             Wheelchair Mobility    Modified Rankin (Stroke Patients Only)       Balance                                            Cognition Arousal/Alertness: Awake/alert Behavior During Therapy: WFL for tasks assessed/performed;Anxious Overall Cognitive Status: Within Functional Limits for tasks assessed                                 General Comments: Pt is A and O x 4        Exercises Total Joint Exercises Ankle Circles/Pumps: AROM;Both;15 reps Quad Sets: AROM;Left Long Arc Quad: AAROM;10 reps;Left Goniometric ROM:  (L knee 5-85 degrees)    General Comments        Pertinent Vitals/Pain Pain Assessment: 0-10 Pain Score:  6  Pain Location: L knee Pain Descriptors / Indicators: Aching;Grimacing;Guarding (with weight bearing) Pain Intervention(s): Monitored during session;Ice applied;Premedicated before session    Home Living                          Prior Function            PT Goals (current goals can now be found in the care plan section) Acute Rehab PT Goals Patient Stated Goal: to return home when ready    Frequency    BID      PT Plan Current plan remains appropriate    Co-evaluation              AM-PAC PT "6 Clicks" Mobility   Outcome  Measure  Help needed turning from your back to your side while in a flat bed without using bedrails?: None Help needed moving from lying on your back to sitting on the side of a flat bed without using bedrails?: A Little Help needed moving to and from a bed to a chair (including a wheelchair)?: A Little Help needed standing up from a chair using your arms (e.g., wheelchair or bedside chair)?: A Little Help needed to walk in hospital room?: A Little Help needed climbing 3-5 steps with a railing? : A Little 6 Click Score: 19    End of Session Equipment Utilized During Treatment: Gait belt Activity Tolerance: Patient limited by lethargy;Patient limited by pain Patient left: in chair;with call bell/phone within reach;with chair alarm set;with family/visitor present Nurse Communication: Mobility status PT Visit Diagnosis: Muscle weakness (generalized) (M62.81);Difficulty in walking, not elsewhere classified (R26.2);Pain Pain - Right/Left: Left Pain - part of body: Knee     Time: 9518-8416 PT Time Calculation (min) (ACUTE ONLY): 46 min  Charges:  $Gait Training: 8-22 mins $Therapeutic Exercise: 8-22 mins $Therapeutic Activity: 8-22 mins                    Mikel Cella, PTA    Josie Dixon 03/26/2021, 12:30 PM

## 2021-03-26 NOTE — Plan of Care (Signed)

## 2021-03-26 NOTE — Progress Notes (Signed)
Physical Therapy Treatment Patient Details Name: Elizabeth Trujillo MRN: [id number] DOB: 15-Jan-1936 Today's Date: 03/26/2021   History of Present Illness admitted for acute hospitalization s/p L TKR, WBAT (03/23/21)    PT Comments    Pt required increased time to complete tasks this pm due to fatigue, pain, and unsuccessful struggle with not having a BM. Pt's transfers are improving with supervision and good hand placement. Ambulation distance was slightly less this pm due to fatigue and 7/10 pain with weight bearing. Pt assisted back to bed A/AAROM performed to L LE, ace bandage rewrapped due to coming unraveled. Corpack placed on and blanket roll under L heel in position. Pt continues to have +1 pitting edema primarily below knee. Upon lying down, pt immediately fell asleep. Pt may benefit from holding d/c until tomorrow. Will reassess in am.   Recommendations for follow up therapy are one component of a multi-disciplinary discharge planning process, led by the attending physician.  Recommendations may be updated based on patient status, additional functional criteria and insurance authorization.  Follow Up Recommendations  Home health PT     Assistance Recommended at Discharge PRN  Patient can return home with the following A little help with walking and/or transfers;A little help with bathing/dressing/bathroom;Assistance with cooking/housework;Help with stairs or ramp for entrance   Equipment Recommendations  Rolling walker (2 wheels);BSC/3in1    Recommendations for Other Services       Precautions / Restrictions Precautions Precautions: Fall Restrictions Weight Bearing Restrictions: Yes LLE Weight Bearing: Weight bearing as tolerated Other Position/Activity Restrictions: confirmed via secure chat with Dr. Harlow Mares     Mobility  Bed Mobility Overal bed mobility: Needs Assistance Bed Mobility: Sit to Supine Rolling: Min assist   Supine to sit: Min guard (For L LE in case of  lost control) Sit to supine: Min guard   General bed mobility comments: assist for L LE management; significant increase in time    Transfers Overall transfer level: Needs assistance Equipment used: Rolling walker (2 wheels) Transfers: Sit to/from Stand Sit to Stand: Supervision           General transfer comment:  (Pt transferred from chair, bed, and commode with supervision and good hand placement)    Ambulation/Gait Ambulation/Gait assistance: Supervision Gait Distance (Feet): 75 Feet Assistive device: Rolling walker (2 wheels) Gait Pattern/deviations: Step-to pattern;Trunk flexed;Antalgic Gait velocity: decreased     General Gait Details: Slow steps with repeated vc's for upright posture. Decreased tolerance for gait distance this pm due to fatigue and pain   Stairs             Wheelchair Mobility    Modified Rankin (Stroke Patients Only)       Balance                                            Cognition Arousal/Alertness: Awake/alert Behavior During Therapy: WFL for tasks assessed/performed;Anxious Overall Cognitive Status: Within Functional Limits for tasks assessed                                 General Comments: Pt is A and O x 4        Exercises Total Joint Exercises Ankle Circles/Pumps: AROM;Both;15 reps Quad Sets: AROM;Left;10 reps Heel Slides: AAROM;Left;10 reps Hip ABduction/ADduction: AAROM;Left;10 reps Long Arc Quad: AAROM;10 reps;Left  Goniometric ROM:  (L knee 5-85 degrees)    General Comments        Pertinent Vitals/Pain Pain Assessment: 0-10 Pain Score: 7  Pain Location: L knee Pain Descriptors / Indicators: Aching;Grimacing;Guarding Pain Intervention(s): Monitored during session;Patient requesting pain meds-RN notified;Ice applied    Home Living                          Prior Function            PT Goals (current goals can now be found in the care plan section) Acute  Rehab PT Goals Patient Stated Goal: to return home when ready    Frequency    BID      PT Plan Current plan remains appropriate    Co-evaluation              AM-PAC PT "6 Clicks" Mobility   Outcome Measure  Help needed turning from your back to your side while in a flat bed without using bedrails?: None Help needed moving from lying on your back to sitting on the side of a flat bed without using bedrails?: A Little Help needed moving to and from a bed to a chair (including a wheelchair)?: A Little Help needed standing up from a chair using your arms (e.g., wheelchair or bedside chair)?: A Little Help needed to walk in hospital room?: A Little Help needed climbing 3-5 steps with a railing? : A Little 6 Click Score: 19    End of Session Equipment Utilized During Treatment: Gait belt Activity Tolerance: Patient limited by lethargy;Patient limited by pain Patient left: in bed;with call bell/phone within reach;with bed alarm set;with family/visitor present;with SCD's reapplied Nurse Communication: Mobility status PT Visit Diagnosis: Muscle weakness (generalized) (M62.81);Difficulty in walking, not elsewhere classified (R26.2);Pain Pain - Right/Left: Left Pain - part of body: Knee     Time: 8841-6606 PT Time Calculation (min) (ACUTE ONLY): 51 min  Charges:  $Gait Training: 8-22 mins $Therapeutic Exercise: 8-22 mins $Therapeutic Activity: 8-22 mins                    Mikel Cella, PTA    Josie Dixon 03/26/2021, 3:56 PM

## 2021-03-27 NOTE — Progress Notes (Signed)
Subjective: 4 Days Post-Op Procedure(s) (LRB): TOTAL KNEE ARTHROPLASTY (Left)    Current is doing some better today.  She is ready for discharge home with home health PT.  Has received prescriptions and instructions. Patient reports pain as mild.  Objective:   VITALS:   Vitals:   03/26/21 1952 03/27/21 0513  BP: (!) 149/86 (!) 141/73  Pulse: 89 81  Resp: 18 18  Temp: 98.5 F (36.9 C)   SpO2: 98% 93%    Patient is alert and cooperative.  Neurovascular status good distally.  Dressing is dry.  LABS Recent Labs    03/25/21 0506  HGB 11.8*  HCT 34.1*  WBC 13.7*  PLT 215    No results for input(s): NA, K, BUN, CREATININE, GLUCOSE in the last 72 hours.  No results for input(s): LABPT, INR in the last 72 hours.   Assessment/Plan: 4 Days Post-Op Procedure(s) (LRB): TOTAL KNEE ARTHROPLASTY (Left)   Discharge home with home health ASA for DVT prophylaxis Follow-up with Dr. Harlow Mares as scheduled

## 2021-03-27 NOTE — TOC Progression Note (Signed)
Transition of Care [facility]) - Progression Note    Patient Details  Name: RUT BETTERTON MRN: [id number] Date of Birth: [birth year]  Transition of Care [facility]) CM/SW [place], [place] Phone Number: 03/27/2021, 8:32 AM  Clinical Narrative:      Patient set up with [facility] services thru [facility]. Patient lives at home with her husband, has ramped entrance, and rolling walker. [employer] will deliver the 3 in 1 to the patient's home. Patient's spouse Eyanna, [place] (Spouse) [phone] (Mobile) will transport her home when ready for d/c. Patient does not have any issues obtaining and affording her medication.  No additional TOC needs.         Expected Discharge Plan and Services           Expected Discharge Date: 03/26/21                                     Social Determinants of Health (SDOH) Interventions    Readmission Risk Interventions No flowsheet data found.

## 2021-03-27 NOTE — Progress Notes (Signed)
Physical Therapy Treatment Patient Details Name: Elizabeth Trujillo MRN: [id number] DOB: December 19, 1935 Today's Date: 03/27/2021   History of Present Illness admitted for acute hospitalization s/p L TKR, WBAT (03/23/21)    PT Comments    Pt on commode.  Assisted with care.  Participated in exercises as described below.  She is able to walk to desk and back.  Overall slow but steady gait.  She has a ramp at home and a wheelchair. She is encouraged to use wheelchair initially to enter home then walk when home.  Discussed HEP, safety, polar care and answered questions as needed.  Daughter to assist husband in transport and she stated they will be with her for a few. Pt feels more comfortable with discharge today,   Recommendations for follow up therapy are one component of a multi-disciplinary discharge planning process, led by the attending physician.  Recommendations may be updated based on patient status, additional functional criteria and insurance authorization.  Follow Up Recommendations  Home health PT     Assistance Recommended at Discharge PRN  Patient can return home with the following A little help with walking and/or transfers;A little help with bathing/dressing/bathroom;Assistance with cooking/housework;Help with stairs or ramp for entrance   Equipment Recommendations  Rolling walker (2 wheels);BSC/3in1    Recommendations for Other Services       Precautions / Restrictions Precautions Precautions: Fall Restrictions Weight Bearing Restrictions: Yes LLE Weight Bearing: Weight bearing as tolerated Other Position/Activity Restrictions: confirmed via secure chat with Dr. Harlow Mares     Mobility  Bed Mobility               General bed mobility comments: up prior to arrival and remained in recliner    Transfers Overall transfer level: Needs assistance Equipment used: Rolling walker (2 wheels) Transfers: Sit to/from Stand Sit to Stand: Supervision;Min guard                 Ambulation/Gait Ambulation/Gait assistance: Min guard Gait Distance (Feet): 75 Feet Assistive device: Rolling walker (2 wheels) Gait Pattern/deviations: Step-to pattern;Trunk flexed;Antalgic Gait velocity: decreased     General Gait Details: slow gait but overall progressing well   Stairs             Wheelchair Mobility    Modified Rankin (Stroke Patients Only)       Balance Overall balance assessment: Needs assistance Sitting-balance support: No upper extremity supported;Feet supported Sitting balance-Leahy Scale: Good     Standing balance support: Bilateral upper extremity supported Standing balance-Leahy Scale: Fair Standing balance comment: reliant on BUE support                            Cognition Arousal/Alertness: Awake/alert Behavior During Therapy: WFL for tasks assessed/performed;Anxious Overall Cognitive Status: Within Functional Limits for tasks assessed                                          Exercises Total Joint Exercises Ankle Circles/Pumps: AROM;Both;15 reps Quad Sets: AROM;Left;10 reps Heel Slides: AAROM;Left;10 reps Hip ABduction/ADduction: AAROM;Left;10 reps Long Arc Quad: AAROM;10 reps;Left Goniometric ROM: 3-85    General Comments        Pertinent Vitals/Pain Pain Assessment: 0-10 Pain Score: 7  Pain Location: L knee Pain Descriptors / Indicators: Aching;Grimacing;Guarding Pain Intervention(s): Monitored during session;Repositioned;Ice applied    Home Living  Prior Function            PT Goals (current goals can now be found in the care plan section) Progress towards PT goals: Progressing toward goals    Frequency    BID      PT Plan Current plan remains appropriate    Co-evaluation              AM-PAC PT "6 Clicks" Mobility   Outcome Measure  Help needed turning from your back to your side while in a flat bed without using  bedrails?: None Help needed moving from lying on your back to sitting on the side of a flat bed without using bedrails?: A Little Help needed moving to and from a bed to a chair (including a wheelchair)?: A Little Help needed standing up from a chair using your arms (e.g., wheelchair or bedside chair)?: A Little Help needed to walk in hospital room?: A Little Help needed climbing 3-5 steps with a railing? : A Little 6 Click Score: 19    End of Session Equipment Utilized During Treatment: Gait belt Activity Tolerance: Patient limited by lethargy;Patient limited by pain Patient left: in bed;with call bell/phone within reach;with bed alarm set;with family/visitor present;with SCD's reapplied   PT Visit Diagnosis: Muscle weakness (generalized) (M62.81);Difficulty in walking, not elsewhere classified (R26.2);Pain Pain - Right/Left: Left Pain - part of body: Knee     Time: 9892-1194 PT Time Calculation (min) (ACUTE ONLY): 39 min  Charges:  $Gait Training: 8-22 mins $Therapeutic Exercise: 8-22 mins                    Chesley Noon, PTA 03/27/21, 10:42 AM

## 2021-03-27 NOTE — Progress Notes (Signed)
Blood pressure (!) 141/89, pulse 94, temperature 98.5 F (36.9 C), resp. rate 18, height 5\' 3"  (1.6 m), weight 91.5 kg, SpO2 95 %. Iv removed site c/d/I discussed d/c packet with husband and wife at bedside. Polar care d/c with pt with all her belongings and scripts sent to rx of choice.

## 2021-03-29 DIAGNOSIS — F419 Anxiety disorder, unspecified: Secondary | ICD-10-CM | POA: Diagnosis not present

## 2021-03-29 DIAGNOSIS — E119 Type 2 diabetes mellitus without complications: Secondary | ICD-10-CM | POA: Diagnosis not present

## 2021-03-29 DIAGNOSIS — Z7984 Long term (current) use of oral hypoglycemic drugs: Secondary | ICD-10-CM | POA: Diagnosis not present

## 2021-03-29 DIAGNOSIS — M069 Rheumatoid arthritis, unspecified: Secondary | ICD-10-CM | POA: Diagnosis not present

## 2021-03-29 DIAGNOSIS — E7800 Pure hypercholesterolemia, unspecified: Secondary | ICD-10-CM | POA: Diagnosis not present

## 2021-03-29 DIAGNOSIS — K219 Gastro-esophageal reflux disease without esophagitis: Secondary | ICD-10-CM | POA: Diagnosis not present

## 2021-03-29 DIAGNOSIS — D649 Anemia, unspecified: Secondary | ICD-10-CM | POA: Diagnosis not present

## 2021-03-29 DIAGNOSIS — Z471 Aftercare following joint replacement surgery: Secondary | ICD-10-CM | POA: Diagnosis not present

## 2021-03-29 DIAGNOSIS — Z8673 Personal history of transient ischemic attack (TIA), and cerebral infarction without residual deficits: Secondary | ICD-10-CM | POA: Diagnosis not present

## 2021-03-29 DIAGNOSIS — Z8551 Personal history of malignant neoplasm of bladder: Secondary | ICD-10-CM | POA: Diagnosis not present

## 2021-03-29 DIAGNOSIS — Z96652 Presence of left artificial knee joint: Secondary | ICD-10-CM | POA: Diagnosis not present

## 2021-03-29 DIAGNOSIS — Z9012 Acquired absence of left breast and nipple: Secondary | ICD-10-CM | POA: Diagnosis not present

## 2021-03-29 DIAGNOSIS — Z853 Personal history of malignant neoplasm of breast: Secondary | ICD-10-CM | POA: Diagnosis not present

## 2021-03-29 DIAGNOSIS — M1990 Unspecified osteoarthritis, unspecified site: Secondary | ICD-10-CM | POA: Diagnosis not present

## 2021-03-29 DIAGNOSIS — R569 Unspecified convulsions: Secondary | ICD-10-CM | POA: Diagnosis not present

## 2021-03-29 DIAGNOSIS — I10 Essential (primary) hypertension: Secondary | ICD-10-CM | POA: Diagnosis not present

## 2021-03-30 DIAGNOSIS — I10 Essential (primary) hypertension: Secondary | ICD-10-CM | POA: Diagnosis not present

## 2021-03-30 DIAGNOSIS — E119 Type 2 diabetes mellitus without complications: Secondary | ICD-10-CM | POA: Diagnosis not present

## 2021-03-30 DIAGNOSIS — M069 Rheumatoid arthritis, unspecified: Secondary | ICD-10-CM | POA: Diagnosis not present

## 2021-03-30 DIAGNOSIS — Z471 Aftercare following joint replacement surgery: Secondary | ICD-10-CM | POA: Diagnosis not present

## 2021-03-30 DIAGNOSIS — D649 Anemia, unspecified: Secondary | ICD-10-CM | POA: Diagnosis not present

## 2021-03-30 DIAGNOSIS — M1990 Unspecified osteoarthritis, unspecified site: Secondary | ICD-10-CM | POA: Diagnosis not present

## 2021-04-01 DIAGNOSIS — I10 Essential (primary) hypertension: Secondary | ICD-10-CM | POA: Diagnosis not present

## 2021-04-01 DIAGNOSIS — E119 Type 2 diabetes mellitus without complications: Secondary | ICD-10-CM | POA: Diagnosis not present

## 2021-04-01 DIAGNOSIS — M1990 Unspecified osteoarthritis, unspecified site: Secondary | ICD-10-CM | POA: Diagnosis not present

## 2021-04-01 DIAGNOSIS — D649 Anemia, unspecified: Secondary | ICD-10-CM | POA: Diagnosis not present

## 2021-04-01 DIAGNOSIS — Z471 Aftercare following joint replacement surgery: Secondary | ICD-10-CM | POA: Diagnosis not present

## 2021-04-01 DIAGNOSIS — M069 Rheumatoid arthritis, unspecified: Secondary | ICD-10-CM | POA: Diagnosis not present

## 2021-04-04 DIAGNOSIS — E119 Type 2 diabetes mellitus without complications: Secondary | ICD-10-CM | POA: Diagnosis not present

## 2021-04-04 DIAGNOSIS — M1990 Unspecified osteoarthritis, unspecified site: Secondary | ICD-10-CM | POA: Diagnosis not present

## 2021-04-04 DIAGNOSIS — Z471 Aftercare following joint replacement surgery: Secondary | ICD-10-CM | POA: Diagnosis not present

## 2021-04-04 DIAGNOSIS — D649 Anemia, unspecified: Secondary | ICD-10-CM | POA: Diagnosis not present

## 2021-04-04 DIAGNOSIS — M069 Rheumatoid arthritis, unspecified: Secondary | ICD-10-CM | POA: Diagnosis not present

## 2021-04-04 DIAGNOSIS — I10 Essential (primary) hypertension: Secondary | ICD-10-CM | POA: Diagnosis not present

## 2021-04-06 DIAGNOSIS — I10 Essential (primary) hypertension: Secondary | ICD-10-CM | POA: Diagnosis not present

## 2021-04-06 DIAGNOSIS — E119 Type 2 diabetes mellitus without complications: Secondary | ICD-10-CM | POA: Diagnosis not present

## 2021-04-06 DIAGNOSIS — M069 Rheumatoid arthritis, unspecified: Secondary | ICD-10-CM | POA: Diagnosis not present

## 2021-04-06 DIAGNOSIS — Z471 Aftercare following joint replacement surgery: Secondary | ICD-10-CM | POA: Diagnosis not present

## 2021-04-06 DIAGNOSIS — D649 Anemia, unspecified: Secondary | ICD-10-CM | POA: Diagnosis not present

## 2021-04-06 DIAGNOSIS — M1990 Unspecified osteoarthritis, unspecified site: Secondary | ICD-10-CM | POA: Diagnosis not present

## 2021-04-08 DIAGNOSIS — Z471 Aftercare following joint replacement surgery: Secondary | ICD-10-CM | POA: Diagnosis not present

## 2021-04-08 DIAGNOSIS — D649 Anemia, unspecified: Secondary | ICD-10-CM | POA: Diagnosis not present

## 2021-04-08 DIAGNOSIS — M069 Rheumatoid arthritis, unspecified: Secondary | ICD-10-CM | POA: Diagnosis not present

## 2021-04-08 DIAGNOSIS — M1990 Unspecified osteoarthritis, unspecified site: Secondary | ICD-10-CM | POA: Diagnosis not present

## 2021-04-08 DIAGNOSIS — E119 Type 2 diabetes mellitus without complications: Secondary | ICD-10-CM | POA: Diagnosis not present

## 2021-04-08 DIAGNOSIS — I10 Essential (primary) hypertension: Secondary | ICD-10-CM | POA: Diagnosis not present

## 2021-04-13 DIAGNOSIS — I10 Essential (primary) hypertension: Secondary | ICD-10-CM | POA: Diagnosis not present

## 2021-04-13 DIAGNOSIS — D649 Anemia, unspecified: Secondary | ICD-10-CM | POA: Diagnosis not present

## 2021-04-13 DIAGNOSIS — M1990 Unspecified osteoarthritis, unspecified site: Secondary | ICD-10-CM | POA: Diagnosis not present

## 2021-04-13 DIAGNOSIS — M069 Rheumatoid arthritis, unspecified: Secondary | ICD-10-CM | POA: Diagnosis not present

## 2021-04-13 DIAGNOSIS — E119 Type 2 diabetes mellitus without complications: Secondary | ICD-10-CM | POA: Diagnosis not present

## 2021-04-13 DIAGNOSIS — Z471 Aftercare following joint replacement surgery: Secondary | ICD-10-CM | POA: Diagnosis not present

## 2021-04-15 DIAGNOSIS — I10 Essential (primary) hypertension: Secondary | ICD-10-CM | POA: Diagnosis not present

## 2021-04-15 DIAGNOSIS — E119 Type 2 diabetes mellitus without complications: Secondary | ICD-10-CM | POA: Diagnosis not present

## 2021-04-15 DIAGNOSIS — D649 Anemia, unspecified: Secondary | ICD-10-CM | POA: Diagnosis not present

## 2021-04-15 DIAGNOSIS — Z471 Aftercare following joint replacement surgery: Secondary | ICD-10-CM | POA: Diagnosis not present

## 2021-04-15 DIAGNOSIS — M1990 Unspecified osteoarthritis, unspecified site: Secondary | ICD-10-CM | POA: Diagnosis not present

## 2021-04-15 DIAGNOSIS — M069 Rheumatoid arthritis, unspecified: Secondary | ICD-10-CM | POA: Diagnosis not present

## 2021-04-19 ENCOUNTER — Encounter: Payer: Self-pay | Admitting: Family Medicine

## 2021-04-19 DIAGNOSIS — Z471 Aftercare following joint replacement surgery: Secondary | ICD-10-CM | POA: Diagnosis not present

## 2021-04-19 DIAGNOSIS — M069 Rheumatoid arthritis, unspecified: Secondary | ICD-10-CM | POA: Diagnosis not present

## 2021-04-19 DIAGNOSIS — D649 Anemia, unspecified: Secondary | ICD-10-CM | POA: Diagnosis not present

## 2021-04-19 DIAGNOSIS — M1990 Unspecified osteoarthritis, unspecified site: Secondary | ICD-10-CM | POA: Diagnosis not present

## 2021-04-19 DIAGNOSIS — I10 Essential (primary) hypertension: Secondary | ICD-10-CM | POA: Diagnosis not present

## 2021-04-19 DIAGNOSIS — E119 Type 2 diabetes mellitus without complications: Secondary | ICD-10-CM | POA: Diagnosis not present

## 2021-04-20 ENCOUNTER — Encounter: Payer: Self-pay | Admitting: Family Medicine

## 2021-04-20 ENCOUNTER — Ambulatory Visit: Payer: Medicare Other | Admitting: Family Medicine

## 2021-04-20 ENCOUNTER — Ambulatory Visit (INDEPENDENT_AMBULATORY_CARE_PROVIDER_SITE_OTHER): Payer: Medicare Other | Admitting: Family Medicine

## 2021-04-20 ENCOUNTER — Other Ambulatory Visit: Payer: Self-pay

## 2021-04-20 VITALS — BP 112/79 | HR 88 | Temp 98.4°F | Resp 16 | Ht 63.0 in

## 2021-04-20 DIAGNOSIS — I10 Essential (primary) hypertension: Secondary | ICD-10-CM

## 2021-04-20 DIAGNOSIS — M1990 Unspecified osteoarthritis, unspecified site: Secondary | ICD-10-CM | POA: Diagnosis not present

## 2021-04-20 DIAGNOSIS — M25569 Pain in unspecified knee: Secondary | ICD-10-CM | POA: Diagnosis not present

## 2021-04-20 DIAGNOSIS — Z6838 Body mass index (BMI) 38.0-38.9, adult: Secondary | ICD-10-CM | POA: Diagnosis not present

## 2021-04-20 DIAGNOSIS — R195 Other fecal abnormalities: Secondary | ICD-10-CM | POA: Diagnosis not present

## 2021-04-20 DIAGNOSIS — Z8554 Personal history of malignant neoplasm of ureter: Secondary | ICD-10-CM

## 2021-04-20 DIAGNOSIS — E7800 Pure hypercholesterolemia, unspecified: Secondary | ICD-10-CM

## 2021-04-20 DIAGNOSIS — L600 Ingrowing nail: Secondary | ICD-10-CM

## 2021-04-20 DIAGNOSIS — C679 Malignant neoplasm of bladder, unspecified: Secondary | ICD-10-CM | POA: Diagnosis not present

## 2021-04-20 DIAGNOSIS — E119 Type 2 diabetes mellitus without complications: Secondary | ICD-10-CM

## 2021-04-20 NOTE — Progress Notes (Signed)
Established patient visit  I,[name],acting as a scribe for [name], MD.,have documented all relevant documentation on the behalf of [name], MD,as directed by  [name], MD while in the presence of [name], MD.   [name]   DOB: April 08, 1935   86 y.o. Female  MRN: [id number] Visit Date: 04/20/2021  Today's healthcare provider: [name], MD   Chief Complaint  Patient presents with   Follow-up   Hyperlipidemia   Subjective    HPI  Patient comes in today 4 weeks after total knee replacement.  Has questions about that about diarrhea for which she sees GI, Dr. Vicente Males.  Diarrhea is improved with Metamucil adjusted by Dr. Vicente Males. she also wishes to have her ingrown toenails checked. She has had her Shingrix vaccines.  Patient is in a wheelchair today.  She is brought in by her daughter. Lipid/Cholesterol, follow-up  Last Lipid Panel: Lab Results  Component Value Date   CHOL 183 08/25/2020   LDLCALC 93 08/25/2020   HDL 46 08/25/2020   TRIG 261 (H) 08/25/2020    She was last seen for this 8 months ago.  Management since that visit includes; labs checked showing-stable.  She reports good compliance with treatment. She is not having side effects. none  She is following a Regular diet. Current exercise: none  Last metabolic panel Lab Results  Component Value Date   GLUCOSE 141 (H) 03/24/2021   NA 138 03/24/2021   K 3.9 03/24/2021   BUN 11 03/24/2021   CREATININE 0.56 03/24/2021   EGFR 88 02/22/2021   GFRNONAA >60 03/24/2021   CALCIUM 8.3 (L) 03/24/2021   AST 17 02/22/2021   ALT 14 02/22/2021   The ASCVD Risk score (Arnett DK, et al., 2019) failed to calculate for the following reasons:   The 2019 ASCVD risk score is only valid for ages 64 to 65  ---------------------------------------------------------------------------------------------------   Medications: Outpatient Medications Prior to  Visit  Medication Sig   acetaminophen (TYLENOL) 500 MG tablet Take 1,000 mg by mouth every 6 (six) hours as needed for moderate pain (pain.).    aspirin 81 MG chewable tablet Chew 1 tablet (81 mg total) by mouth 2 (two) times daily.   atorvastatin (LIPITOR) 40 MG tablet Take 1 tablet by mouth once daily   Calcium Carb-Cholecalciferol (CALCIUM+D3 PO) Take 1 tablet by mouth daily.    cholecalciferol (VITAMIN D) 25 MCG (1000 UT) tablet Take 1,000 Units by mouth at bedtime.   HYDROcodone-acetaminophen (NORCO/VICODIN) 5-325 MG tablet Take 1 tablet by mouth every 4 (four) hours as needed for moderate pain (pain).   hydrocortisone 2.5 % ointment Apply topically as needed (rash; hemorrhoids).   ketoconazole (NIZORAL) 2 % shampoo Apply 1 application topically as needed (flaky/itchy scalp). Up to once per week   levETIRAcetam (KEPPRA) 500 MG tablet Take 500 mg by mouth 2 (two) times daily.   metFORMIN (GLUCOPHAGE) 500 MG tablet Take 1 tablet by mouth once daily with breakfast   methocarbamol (ROBAXIN) 500 MG tablet Take 1 tablet (500 mg total) by mouth every 6 (six) hours as needed for muscle spasms.   Multiple Vitamin (MULTIVITAMIN WITH MINERALS) TABS tablet Take 1 tablet by mouth daily at 2 PM.   nitrofurantoin, macrocrystal-monohydrate, (MACROBID) 100 MG capsule Take 1 capsule (100 mg total) by mouth every 12 (twelve) hours. Take 1 capsule (100 mg total) by mouth every 12 (twelve) hours. Start this medication 3 days before  your scheduled cystoscopy which is on a Friday.  This means that you should start this medication on Tuesday and then complete the course for a total of 5 days. (Patient taking differently: Take 100 mg by mouth every 12 (twelve) hours. Take 1 capsule (100 mg total) by mouth every 12 (twelve) hours. Start this medication 3 days before your scheduled cystoscopy which is on a Friday.  This means that you should start this medication on Tuesday and then complete the course for a total of 5  days.  Starts 3 days before cystoscopy (every 3 months))   nystatin cream (MYCOSTATIN) Apply 1 application topically daily as needed for dry skin (fungal infection).   Phenazopyridine HCl (AZO DINE PO) Take 100 mg by mouth as needed (dysuria).   psyllium (METAMUCIL SMOOTH TEXTURE) 28 % packet Take 1 packet by mouth 2 (two) times daily. 2 -3 tablespoons a day   simethicone (MYLICON) 644 MG chewable tablet Chew 125 mg by mouth every 6 (six) hours as needed for flatulence (take 2 tablets).   simethicone (MYLICON) 80 MG chewable tablet Chew 80 mg by mouth every 6 (six) hours as needed for flatulence.   loperamide (IMODIUM) 2 MG capsule Take 2 mg by mouth as needed for diarrhea or loose stools. (Patient not taking: Reported on 04/20/2021)   traMADol (ULTRAM) 50 MG tablet tramadol 50 mg tablet  Take 1 tablet every 6 hours by oral route as needed. (Patient not taking: Reported on 04/20/2021)   No facility-administered medications prior to visit.    Review of Systems      Objective    BP 112/79 (BP Location: Right Arm, Patient Position: Sitting, Cuff Size: Large)    Pulse 88    Temp 98.4 F (36.9 C) (Temporal)    Resp 16    Ht 5' 3"  (1.6 m)    SpO2 95%    BMI 35.75 kg/m  BP Readings from Last 3 Encounters:  04/20/21 112/79  03/27/21 (!) 141/89  03/10/21 (!) 142/78   Wt Readings from Last 3 Encounters:  03/23/21 201 lb 12.8 oz (91.5 kg)  03/10/21 201 lb 12.8 oz (91.5 kg)  02/22/21 201 lb (91.2 kg)      Physical Exam Vitals reviewed.  Constitutional:      General: She is not in acute distress.    Appearance: She is well-developed.  HENT:     Head: Normocephalic and atraumatic.     Right Ear: Hearing normal.     Left Ear: Hearing normal.     Nose: Nose normal.  Eyes:     General: Lids are normal. No scleral icterus.       Right eye: No discharge.        Left eye: No discharge.     Conjunctiva/sclera: Conjunctivae normal.  Cardiovascular:     Rate and Rhythm: Normal rate and  regular rhythm.     Heart sounds: Normal heart sounds.  Pulmonary:     Effort: Pulmonary effort is normal. No respiratory distress.  Skin:    Findings: No lesion or rash.  Neurological:     General: No focal deficit present.     Mental Status: She is alert and oriented to person, place, and time.  Psychiatric:        Mood and Affect: Mood normal.        Speech: Speech normal.        Behavior: Behavior normal.        Thought Content: Thought content normal.  Judgment: Judgment normal.      No results found for any visits on 04/20/21.  Assessment & Plan     1. Primary hypertension Good control. - Lipid panel - TSH - CBC w/Diff/Platelet - Comprehensive Metabolic Panel (CMET)  2. Hypercholesterolemia Atorvastatin - Lipid panel - TSH - CBC w/Diff/Platelet - Comprehensive Metabolic Panel (CMET)  3. Osteoarthritis, unspecified osteoarthritis type, unspecified site This post total knee replacement.  Follow-up for pain medications with her surgeon - Lipid panel - TSH - CBC w/Diff/Platelet - Comprehensive Metabolic Panel (CMET)  4. Type 2 diabetes mellitus without complication, without long-term current use of insulin (HCC) Controlled on metformin - Lipid panel - TSH - CBC w/Diff/Platelet - Comprehensive Metabolic Panel (CMET)  5. Ingrown toenail of right foot No sign of infection today.  Patient advised not to cut her toenails back as much as she has - Lipid panel - TSH - CBC w/Diff/Platelet - Comprehensive Metabolic Panel (CMET)  6. Class 2 severe obesity due to excess calories with serious comorbidity and body mass index (BMI) of 38.0 to 38.9 in adult [facility]) Ongoing diet and exercise stressed  7. History of cancer of ureter Followed by urology and oncology  8. Loose stools Improved with Metamucil  9. Knee pain, unspecified chronicity, unspecified laterality Status post TKR More than 50% 25 minute visit spent in counseling or coordination of  care  10. Urothelial carcinoma of bladder ([place])     Return in about 6 months (around 10/18/2021).      I, [name], MD, have reviewed all documentation for this visit. The documentation on 04/23/21 for the exam, diagnosis, procedures, and orders are all accurate and complete.    Mallisa Alameda Cranford Mon, MD  [facility] [phone] (phone) [phone] (fax)  [place]

## 2021-04-21 DIAGNOSIS — E119 Type 2 diabetes mellitus without complications: Secondary | ICD-10-CM | POA: Diagnosis not present

## 2021-04-21 DIAGNOSIS — D649 Anemia, unspecified: Secondary | ICD-10-CM | POA: Diagnosis not present

## 2021-04-21 DIAGNOSIS — M069 Rheumatoid arthritis, unspecified: Secondary | ICD-10-CM | POA: Diagnosis not present

## 2021-04-21 DIAGNOSIS — M1990 Unspecified osteoarthritis, unspecified site: Secondary | ICD-10-CM | POA: Diagnosis not present

## 2021-04-21 DIAGNOSIS — I10 Essential (primary) hypertension: Secondary | ICD-10-CM | POA: Diagnosis not present

## 2021-04-21 DIAGNOSIS — Z471 Aftercare following joint replacement surgery: Secondary | ICD-10-CM | POA: Diagnosis not present

## 2021-04-21 LAB — CBC WITH DIFFERENTIAL/PLATELET
Basophils Absolute: 0.1 10*3/uL (ref 0.0–0.2)
Basos: 1 %
EOS (ABSOLUTE): 0.2 10*3/uL (ref 0.0–0.4)
Eos: 1 %
Hematocrit: 41.4 % (ref 34.0–46.6)
Hemoglobin: 13.5 g/dL (ref 11.1–15.9)
Immature Grans (Abs): 0 10*3/uL (ref 0.0–0.1)
Immature Granulocytes: 0 %
Lymphocytes Absolute: 1.9 10*3/uL (ref 0.7–3.1)
Lymphs: 18 %
MCH: 29.9 pg (ref 26.6–33.0)
MCHC: 32.6 g/dL (ref 31.5–35.7)
MCV: 92 fL (ref 79–97)
Monocytes Absolute: 1.1 10*3/uL — ABNORMAL HIGH (ref 0.1–0.9)
Monocytes: 10 %
Neutrophils Absolute: 7.5 10*3/uL — ABNORMAL HIGH (ref 1.4–7.0)
Neutrophils: 70 %
Platelets: 420 10*3/uL (ref 150–450)
RBC: 4.51 x10E6/uL (ref 3.77–5.28)
RDW: 13.9 % (ref 11.7–15.4)
WBC: 10.8 10*3/uL (ref 3.4–10.8)

## 2021-04-21 LAB — COMPREHENSIVE METABOLIC PANEL
ALT: 12 IU/L (ref 0–32)
AST: 13 IU/L (ref 0–40)
Albumin/Globulin Ratio: 1.5 (ref 1.2–2.2)
Albumin: 3.8 g/dL (ref 3.6–4.6)
Alkaline Phosphatase: 132 IU/L — ABNORMAL HIGH (ref 44–121)
BUN/Creatinine Ratio: 21 (ref 12–28)
BUN: 13 mg/dL (ref 8–27)
Bilirubin Total: 0.6 mg/dL (ref 0.0–1.2)
CO2: 24 mmol/L (ref 20–29)
Calcium: 9.4 mg/dL (ref 8.7–10.3)
Chloride: 106 mmol/L (ref 96–106)
Creatinine, Ser: 0.62 mg/dL (ref 0.57–1.00)
Globulin, Total: 2.5 g/dL (ref 1.5–4.5)
Glucose: 99 mg/dL (ref 70–99)
Potassium: 4.2 mmol/L (ref 3.5–5.2)
Sodium: 144 mmol/L (ref 134–144)
Total Protein: 6.3 g/dL (ref 6.0–8.5)
eGFR: 88 mL/min/{1.73_m2} (ref >59)

## 2021-04-21 LAB — LIPID PANEL
Chol/HDL Ratio: 3.4 ratio (ref 0.0–4.4)
Cholesterol, Total: 181 mg/dL (ref 100–199)
HDL: 53 mg/dL (ref >39)
LDL Chol Calc (NIH): 96 mg/dL (ref 0–99)
Triglycerides: 188 mg/dL — ABNORMAL HIGH (ref 0–149)
VLDL Cholesterol Cal: 32 mg/dL (ref 5–40)

## 2021-04-21 LAB — TSH: TSH: 1.9 u[IU]/mL (ref 0.450–4.500)

## 2021-04-25 DIAGNOSIS — M25562 Pain in left knee: Secondary | ICD-10-CM | POA: Diagnosis not present

## 2021-04-25 DIAGNOSIS — M25662 Stiffness of left knee, not elsewhere classified: Secondary | ICD-10-CM | POA: Diagnosis not present

## 2021-04-25 DIAGNOSIS — Z96652 Presence of left artificial knee joint: Secondary | ICD-10-CM | POA: Diagnosis not present

## 2021-04-25 DIAGNOSIS — M6281 Muscle weakness (generalized): Secondary | ICD-10-CM | POA: Diagnosis not present

## 2021-04-26 DIAGNOSIS — Z96652 Presence of left artificial knee joint: Secondary | ICD-10-CM | POA: Diagnosis not present

## 2021-04-26 DIAGNOSIS — M25662 Stiffness of left knee, not elsewhere classified: Secondary | ICD-10-CM | POA: Diagnosis not present

## 2021-04-26 DIAGNOSIS — M25562 Pain in left knee: Secondary | ICD-10-CM | POA: Diagnosis not present

## 2021-04-26 DIAGNOSIS — M6281 Muscle weakness (generalized): Secondary | ICD-10-CM | POA: Diagnosis not present

## 2021-04-28 DIAGNOSIS — M25662 Stiffness of left knee, not elsewhere classified: Secondary | ICD-10-CM | POA: Diagnosis not present

## 2021-04-28 DIAGNOSIS — Z96652 Presence of left artificial knee joint: Secondary | ICD-10-CM | POA: Diagnosis not present

## 2021-04-28 DIAGNOSIS — M25562 Pain in left knee: Secondary | ICD-10-CM | POA: Diagnosis not present

## 2021-04-28 DIAGNOSIS — M6281 Muscle weakness (generalized): Secondary | ICD-10-CM | POA: Diagnosis not present

## 2021-05-03 DIAGNOSIS — Z96652 Presence of left artificial knee joint: Secondary | ICD-10-CM | POA: Diagnosis not present

## 2021-05-03 DIAGNOSIS — M6281 Muscle weakness (generalized): Secondary | ICD-10-CM | POA: Diagnosis not present

## 2021-05-03 DIAGNOSIS — M25662 Stiffness of left knee, not elsewhere classified: Secondary | ICD-10-CM | POA: Diagnosis not present

## 2021-05-03 DIAGNOSIS — M25562 Pain in left knee: Secondary | ICD-10-CM | POA: Diagnosis not present

## 2021-05-05 DIAGNOSIS — M25562 Pain in left knee: Secondary | ICD-10-CM | POA: Diagnosis not present

## 2021-05-05 DIAGNOSIS — Z96652 Presence of left artificial knee joint: Secondary | ICD-10-CM | POA: Diagnosis not present

## 2021-05-05 DIAGNOSIS — M25662 Stiffness of left knee, not elsewhere classified: Secondary | ICD-10-CM | POA: Diagnosis not present

## 2021-05-05 DIAGNOSIS — M6281 Muscle weakness (generalized): Secondary | ICD-10-CM | POA: Diagnosis not present

## 2021-05-06 DIAGNOSIS — M25562 Pain in left knee: Secondary | ICD-10-CM | POA: Diagnosis not present

## 2021-05-06 DIAGNOSIS — Z96652 Presence of left artificial knee joint: Secondary | ICD-10-CM | POA: Diagnosis not present

## 2021-05-06 DIAGNOSIS — M25662 Stiffness of left knee, not elsewhere classified: Secondary | ICD-10-CM | POA: Diagnosis not present

## 2021-05-06 DIAGNOSIS — M6281 Muscle weakness (generalized): Secondary | ICD-10-CM | POA: Diagnosis not present

## 2021-05-09 DIAGNOSIS — Z96652 Presence of left artificial knee joint: Secondary | ICD-10-CM | POA: Diagnosis not present

## 2021-05-09 DIAGNOSIS — M6281 Muscle weakness (generalized): Secondary | ICD-10-CM | POA: Diagnosis not present

## 2021-05-09 DIAGNOSIS — M25662 Stiffness of left knee, not elsewhere classified: Secondary | ICD-10-CM | POA: Diagnosis not present

## 2021-05-09 DIAGNOSIS — M25562 Pain in left knee: Secondary | ICD-10-CM | POA: Diagnosis not present

## 2021-05-11 DIAGNOSIS — Z96652 Presence of left artificial knee joint: Secondary | ICD-10-CM | POA: Diagnosis not present

## 2021-05-11 DIAGNOSIS — M25562 Pain in left knee: Secondary | ICD-10-CM | POA: Diagnosis not present

## 2021-05-11 DIAGNOSIS — M25662 Stiffness of left knee, not elsewhere classified: Secondary | ICD-10-CM | POA: Diagnosis not present

## 2021-05-11 DIAGNOSIS — M6281 Muscle weakness (generalized): Secondary | ICD-10-CM | POA: Diagnosis not present

## 2021-05-12 DIAGNOSIS — M25562 Pain in left knee: Secondary | ICD-10-CM | POA: Diagnosis not present

## 2021-05-12 DIAGNOSIS — M6281 Muscle weakness (generalized): Secondary | ICD-10-CM | POA: Diagnosis not present

## 2021-05-12 DIAGNOSIS — M25662 Stiffness of left knee, not elsewhere classified: Secondary | ICD-10-CM | POA: Diagnosis not present

## 2021-05-12 DIAGNOSIS — Z96652 Presence of left artificial knee joint: Secondary | ICD-10-CM | POA: Diagnosis not present

## 2021-05-16 DIAGNOSIS — M25662 Stiffness of left knee, not elsewhere classified: Secondary | ICD-10-CM | POA: Diagnosis not present

## 2021-05-16 DIAGNOSIS — M6281 Muscle weakness (generalized): Secondary | ICD-10-CM | POA: Diagnosis not present

## 2021-05-16 DIAGNOSIS — M25562 Pain in left knee: Secondary | ICD-10-CM | POA: Diagnosis not present

## 2021-05-16 DIAGNOSIS — Z96652 Presence of left artificial knee joint: Secondary | ICD-10-CM | POA: Diagnosis not present

## 2021-05-18 DIAGNOSIS — M25562 Pain in left knee: Secondary | ICD-10-CM | POA: Diagnosis not present

## 2021-05-18 DIAGNOSIS — M6281 Muscle weakness (generalized): Secondary | ICD-10-CM | POA: Diagnosis not present

## 2021-05-18 DIAGNOSIS — Z96652 Presence of left artificial knee joint: Secondary | ICD-10-CM | POA: Diagnosis not present

## 2021-05-18 DIAGNOSIS — M25662 Stiffness of left knee, not elsewhere classified: Secondary | ICD-10-CM | POA: Diagnosis not present

## 2021-05-19 DIAGNOSIS — M25562 Pain in left knee: Secondary | ICD-10-CM | POA: Diagnosis not present

## 2021-05-19 DIAGNOSIS — E119 Type 2 diabetes mellitus without complications: Secondary | ICD-10-CM | POA: Diagnosis not present

## 2021-05-19 DIAGNOSIS — M6281 Muscle weakness (generalized): Secondary | ICD-10-CM | POA: Diagnosis not present

## 2021-05-19 DIAGNOSIS — Z96652 Presence of left artificial knee joint: Secondary | ICD-10-CM | POA: Diagnosis not present

## 2021-05-19 DIAGNOSIS — M25662 Stiffness of left knee, not elsewhere classified: Secondary | ICD-10-CM | POA: Diagnosis not present

## 2021-05-19 LAB — HM DIABETES EYE EXAM

## 2021-05-20 ENCOUNTER — Other Ambulatory Visit: Payer: Medicare Other | Admitting: Urology

## 2021-05-23 DIAGNOSIS — M25562 Pain in left knee: Secondary | ICD-10-CM | POA: Diagnosis not present

## 2021-05-23 DIAGNOSIS — M25662 Stiffness of left knee, not elsewhere classified: Secondary | ICD-10-CM | POA: Diagnosis not present

## 2021-05-23 DIAGNOSIS — Z96652 Presence of left artificial knee joint: Secondary | ICD-10-CM | POA: Diagnosis not present

## 2021-05-23 DIAGNOSIS — M6281 Muscle weakness (generalized): Secondary | ICD-10-CM | POA: Diagnosis not present

## 2021-05-24 DIAGNOSIS — M2042 Other hammer toe(s) (acquired), left foot: Secondary | ICD-10-CM | POA: Diagnosis not present

## 2021-05-24 DIAGNOSIS — M79672 Pain in left foot: Secondary | ICD-10-CM | POA: Diagnosis not present

## 2021-05-25 DIAGNOSIS — E119 Type 2 diabetes mellitus without complications: Secondary | ICD-10-CM | POA: Diagnosis not present

## 2021-05-25 DIAGNOSIS — Z96652 Presence of left artificial knee joint: Secondary | ICD-10-CM | POA: Diagnosis not present

## 2021-05-25 DIAGNOSIS — G8929 Other chronic pain: Secondary | ICD-10-CM | POA: Diagnosis not present

## 2021-05-25 DIAGNOSIS — M25562 Pain in left knee: Secondary | ICD-10-CM | POA: Diagnosis not present

## 2021-05-25 DIAGNOSIS — M5136 Other intervertebral disc degeneration, lumbar region: Secondary | ICD-10-CM | POA: Diagnosis not present

## 2021-05-25 DIAGNOSIS — M6281 Muscle weakness (generalized): Secondary | ICD-10-CM | POA: Diagnosis not present

## 2021-05-25 DIAGNOSIS — M5442 Lumbago with sciatica, left side: Secondary | ICD-10-CM | POA: Diagnosis not present

## 2021-05-25 DIAGNOSIS — M25662 Stiffness of left knee, not elsewhere classified: Secondary | ICD-10-CM | POA: Diagnosis not present

## 2021-05-26 DIAGNOSIS — M6281 Muscle weakness (generalized): Secondary | ICD-10-CM | POA: Diagnosis not present

## 2021-05-26 DIAGNOSIS — Z96652 Presence of left artificial knee joint: Secondary | ICD-10-CM | POA: Diagnosis not present

## 2021-05-26 DIAGNOSIS — M25562 Pain in left knee: Secondary | ICD-10-CM | POA: Diagnosis not present

## 2021-05-26 DIAGNOSIS — M25662 Stiffness of left knee, not elsewhere classified: Secondary | ICD-10-CM | POA: Diagnosis not present

## 2021-05-30 DIAGNOSIS — M25562 Pain in left knee: Secondary | ICD-10-CM | POA: Diagnosis not present

## 2021-05-30 DIAGNOSIS — M6281 Muscle weakness (generalized): Secondary | ICD-10-CM | POA: Diagnosis not present

## 2021-05-30 DIAGNOSIS — M25662 Stiffness of left knee, not elsewhere classified: Secondary | ICD-10-CM | POA: Diagnosis not present

## 2021-05-30 DIAGNOSIS — Z96652 Presence of left artificial knee joint: Secondary | ICD-10-CM | POA: Diagnosis not present

## 2021-06-01 DIAGNOSIS — M25662 Stiffness of left knee, not elsewhere classified: Secondary | ICD-10-CM | POA: Diagnosis not present

## 2021-06-01 DIAGNOSIS — M25562 Pain in left knee: Secondary | ICD-10-CM | POA: Diagnosis not present

## 2021-06-01 DIAGNOSIS — M6281 Muscle weakness (generalized): Secondary | ICD-10-CM | POA: Diagnosis not present

## 2021-06-01 DIAGNOSIS — Z96652 Presence of left artificial knee joint: Secondary | ICD-10-CM | POA: Diagnosis not present

## 2021-06-02 ENCOUNTER — Ambulatory Visit
Admission: RE | Admit: 2021-06-02 | Discharge: 2021-06-02 | Disposition: A | Discharge status: Home / Self Care | Payer: Medicare Other | Source: Ambulatory Visit | Attending: Urology | Admitting: Urology

## 2021-06-02 DIAGNOSIS — D090 Carcinoma in situ of bladder: Secondary | ICD-10-CM | POA: Insufficient documentation

## 2021-06-02 DIAGNOSIS — C662 Malignant neoplasm of left ureter: Secondary | ICD-10-CM | POA: Insufficient documentation

## 2021-06-02 DIAGNOSIS — K449 Diaphragmatic hernia without obstruction or gangrene: Secondary | ICD-10-CM | POA: Diagnosis not present

## 2021-06-02 DIAGNOSIS — K5730 Diverticulosis of large intestine without perforation or abscess without bleeding: Secondary | ICD-10-CM | POA: Diagnosis not present

## 2021-06-02 LAB — POCT I-STAT CREATININE: Creatinine, Ser: 0.6 mg/dL (ref 0.44–1.00)

## 2021-06-02 MED ORDER — IOHEXOL 300 MG/ML  SOLN
100.0000 mL | Freq: Once | INTRAMUSCULAR | Status: AC | PRN
  Administered 2021-06-02: 100 mL via INTRAVENOUS

## 2021-06-06 DIAGNOSIS — M25662 Stiffness of left knee, not elsewhere classified: Secondary | ICD-10-CM | POA: Diagnosis not present

## 2021-06-06 DIAGNOSIS — M25562 Pain in left knee: Secondary | ICD-10-CM | POA: Diagnosis not present

## 2021-06-06 DIAGNOSIS — Z96652 Presence of left artificial knee joint: Secondary | ICD-10-CM | POA: Diagnosis not present

## 2021-06-06 DIAGNOSIS — M6281 Muscle weakness (generalized): Secondary | ICD-10-CM | POA: Diagnosis not present

## 2021-06-08 DIAGNOSIS — M25662 Stiffness of left knee, not elsewhere classified: Secondary | ICD-10-CM | POA: Diagnosis not present

## 2021-06-08 DIAGNOSIS — M6281 Muscle weakness (generalized): Secondary | ICD-10-CM | POA: Diagnosis not present

## 2021-06-08 DIAGNOSIS — M25562 Pain in left knee: Secondary | ICD-10-CM | POA: Diagnosis not present

## 2021-06-08 DIAGNOSIS — Z96652 Presence of left artificial knee joint: Secondary | ICD-10-CM | POA: Diagnosis not present

## 2021-06-09 DIAGNOSIS — M25662 Stiffness of left knee, not elsewhere classified: Secondary | ICD-10-CM | POA: Diagnosis not present

## 2021-06-09 DIAGNOSIS — Z96652 Presence of left artificial knee joint: Secondary | ICD-10-CM | POA: Diagnosis not present

## 2021-06-09 DIAGNOSIS — M25562 Pain in left knee: Secondary | ICD-10-CM | POA: Diagnosis not present

## 2021-06-09 DIAGNOSIS — M6281 Muscle weakness (generalized): Secondary | ICD-10-CM | POA: Diagnosis not present

## 2021-06-13 DIAGNOSIS — M6281 Muscle weakness (generalized): Secondary | ICD-10-CM | POA: Diagnosis not present

## 2021-06-13 DIAGNOSIS — M25662 Stiffness of left knee, not elsewhere classified: Secondary | ICD-10-CM | POA: Diagnosis not present

## 2021-06-13 DIAGNOSIS — Z96652 Presence of left artificial knee joint: Secondary | ICD-10-CM | POA: Diagnosis not present

## 2021-06-13 DIAGNOSIS — M25562 Pain in left knee: Secondary | ICD-10-CM | POA: Diagnosis not present

## 2021-06-15 DIAGNOSIS — M25562 Pain in left knee: Secondary | ICD-10-CM | POA: Diagnosis not present

## 2021-06-15 DIAGNOSIS — M25662 Stiffness of left knee, not elsewhere classified: Secondary | ICD-10-CM | POA: Diagnosis not present

## 2021-06-15 DIAGNOSIS — Z96652 Presence of left artificial knee joint: Secondary | ICD-10-CM | POA: Diagnosis not present

## 2021-06-15 DIAGNOSIS — M6281 Muscle weakness (generalized): Secondary | ICD-10-CM | POA: Diagnosis not present

## 2021-06-16 ENCOUNTER — Other Ambulatory Visit: Payer: Self-pay | Admitting: *Deleted

## 2021-06-16 DIAGNOSIS — N390 Urinary tract infection, site not specified: Secondary | ICD-10-CM

## 2021-06-16 MED ORDER — NITROFURANTOIN MONOHYD MACRO 100 MG PO CAPS: 100.0000 mg | ORAL_CAPSULE | Freq: Two times a day (BID) | ORAL | 0 refills | Status: DC

## 2021-06-20 DIAGNOSIS — M25562 Pain in left knee: Secondary | ICD-10-CM | POA: Diagnosis not present

## 2021-06-20 DIAGNOSIS — M25662 Stiffness of left knee, not elsewhere classified: Secondary | ICD-10-CM | POA: Diagnosis not present

## 2021-06-20 DIAGNOSIS — M6281 Muscle weakness (generalized): Secondary | ICD-10-CM | POA: Diagnosis not present

## 2021-06-20 DIAGNOSIS — Z96652 Presence of left artificial knee joint: Secondary | ICD-10-CM | POA: Diagnosis not present

## 2021-06-20 NOTE — Progress Notes (Signed)
? ?  06/22/21 ? ?CC:  ?Chief Complaint  ?Patient presents with  ? Cysto  ? ? ? ?HPI: ?Elizabeth Trujillo is a 86 y.o.female with a personal history rUTIs,CIS, urothelial carcinoma of left distal ureter, and high risk hematuria, who presents today for surveillance cystoscopy and CT scan results.  ? ?Surgical pathology revealed CIS of the bladder and left upper tract urothelial carcinoma, high-grade of the distal ureter. She underwent ablative therapy and BCG in 2019. She returned to the operating room on 12/20 for surveillance including a cystoscopy and ureteroscopy w/ NED at that time. Personally discussed w/ radiology regarding her distal ureter malignancy.  ?  ?She went to the operating room on 11/2019 for surveillance ureteroscopy.  Bilateral  retrograde indicated some slight narrowing of the left distal ureter with prompt drainage, ureteroscopy was unremarkable ? ?CTU on 06/02/2021 visualized stable CTS of the abdomen and pelvis. No evidence of metastatic ?disease or recurrent urothelial malignancy. Stable left renal cortical scarring, pelvic floor laxity and small cystocele. ? ?She has been taking periprocedural antibiotics.  ? ?Vitals:  ? 06/21/21 1528  ?BP: 134/87  ?Pulse: 75  ? ?NED. A&Ox3.   ?No respiratory distress   ?Abd soft, NT, ND ?Normal external genitalia with patent urethral meatus ? ?Cystoscopy Procedure Note ? ?Patient identification was confirmed, informed consent was obtained, and patient was prepped using Betadine solution.  Lidocaine jelly was administered per urethral meatus.   ? ?Procedure: ?- Flexible cystoscope introduced, without any difficulty.   ?- Thorough search of the bladder revealed: ?   normal urethral meatus ?   normal urothelium ?   no stones ?   no ulcers  ?   no tumors ?   no urethral polyps ?   no trabeculation ? ?- Ureteral orifices were normal in position and appearance. ? ?Post-Procedure: ?- Patient tolerated the procedure well ? ?Assessment/ Plan: ? ?1. CIS (carcinoma in situ  of bladder)/ uppper tract urothelial cancer ?- NED today  ?- Continue q. 49-monthcystoscopy with periprocedural abx (macrobid x 3 days starting day before procedure) ? ?2. Urothel[place] of [place] distal [place]ter [place] ?- CTU was reassuring with NED  ?-will repeat imaging on a q6 month interval ? ? ?IConley Rollsas a scribe for AHollice Espy MD.,have documented all relevant documentation on the behalf of AHollice Espy MD,as directed by  AHollice Espy MD while in the presence of AHollice Espy MD. ? ?I have reviewed the above documentation for accuracy and completeness, and I agree with the above.  ? ?AHollice Espy MD ? ? ? ?

## 2021-06-21 ENCOUNTER — Ambulatory Visit (INDEPENDENT_AMBULATORY_CARE_PROVIDER_SITE_OTHER): Payer: Medicare Other | Admitting: Urology

## 2021-06-21 VITALS — BP 134/87 | HR 75 | Ht 63.0 in | Wt 201.0 lb

## 2021-06-21 DIAGNOSIS — N390 Urinary tract infection, site not specified: Secondary | ICD-10-CM

## 2021-06-21 DIAGNOSIS — D090 Carcinoma in situ of bladder: Secondary | ICD-10-CM | POA: Diagnosis not present

## 2021-06-21 MED ORDER — NITROFURANTOIN MONOHYD MACRO 100 MG PO CAPS: 100.0000 mg | ORAL_CAPSULE | Freq: Two times a day (BID) | ORAL | 0 refills | Status: DC

## 2021-06-22 DIAGNOSIS — Z96652 Presence of left artificial knee joint: Secondary | ICD-10-CM | POA: Diagnosis not present

## 2021-06-22 DIAGNOSIS — M25662 Stiffness of left knee, not elsewhere classified: Secondary | ICD-10-CM | POA: Diagnosis not present

## 2021-06-22 DIAGNOSIS — M25562 Pain in left knee: Secondary | ICD-10-CM | POA: Diagnosis not present

## 2021-06-22 DIAGNOSIS — M6281 Muscle weakness (generalized): Secondary | ICD-10-CM | POA: Diagnosis not present

## 2021-06-27 DIAGNOSIS — M6281 Muscle weakness (generalized): Secondary | ICD-10-CM | POA: Diagnosis not present

## 2021-06-27 DIAGNOSIS — M25662 Stiffness of left knee, not elsewhere classified: Secondary | ICD-10-CM | POA: Diagnosis not present

## 2021-06-27 DIAGNOSIS — M25562 Pain in left knee: Secondary | ICD-10-CM | POA: Diagnosis not present

## 2021-06-27 DIAGNOSIS — Z96652 Presence of left artificial knee joint: Secondary | ICD-10-CM | POA: Diagnosis not present

## 2021-06-29 DIAGNOSIS — R569 Unspecified convulsions: Secondary | ICD-10-CM | POA: Diagnosis not present

## 2021-06-29 DIAGNOSIS — G3184 Mild cognitive impairment, so stated: Secondary | ICD-10-CM | POA: Diagnosis not present

## 2021-06-29 DIAGNOSIS — R202 Paresthesia of skin: Secondary | ICD-10-CM | POA: Diagnosis not present

## 2021-06-29 DIAGNOSIS — R9082 White matter disease, unspecified: Secondary | ICD-10-CM | POA: Diagnosis not present

## 2021-06-29 DIAGNOSIS — R200 Anesthesia of skin: Secondary | ICD-10-CM | POA: Diagnosis not present

## 2021-06-29 DIAGNOSIS — R269 Unspecified abnormalities of gait and mobility: Secondary | ICD-10-CM | POA: Diagnosis not present

## 2021-06-30 DIAGNOSIS — M25562 Pain in left knee: Secondary | ICD-10-CM | POA: Diagnosis not present

## 2021-06-30 DIAGNOSIS — M6281 Muscle weakness (generalized): Secondary | ICD-10-CM | POA: Diagnosis not present

## 2021-06-30 DIAGNOSIS — Z96652 Presence of left artificial knee joint: Secondary | ICD-10-CM | POA: Diagnosis not present

## 2021-06-30 DIAGNOSIS — M25662 Stiffness of left knee, not elsewhere classified: Secondary | ICD-10-CM | POA: Diagnosis not present

## 2021-07-12 DIAGNOSIS — M25662 Stiffness of left knee, not elsewhere classified: Secondary | ICD-10-CM | POA: Diagnosis not present

## 2021-07-12 DIAGNOSIS — Z96652 Presence of left artificial knee joint: Secondary | ICD-10-CM | POA: Diagnosis not present

## 2021-07-12 DIAGNOSIS — M25562 Pain in left knee: Secondary | ICD-10-CM | POA: Diagnosis not present

## 2021-07-12 DIAGNOSIS — M6281 Muscle weakness (generalized): Secondary | ICD-10-CM | POA: Diagnosis not present

## 2021-07-14 DIAGNOSIS — M25562 Pain in left knee: Secondary | ICD-10-CM | POA: Diagnosis not present

## 2021-07-14 DIAGNOSIS — M25662 Stiffness of left knee, not elsewhere classified: Secondary | ICD-10-CM | POA: Diagnosis not present

## 2021-07-14 DIAGNOSIS — M6281 Muscle weakness (generalized): Secondary | ICD-10-CM | POA: Diagnosis not present

## 2021-07-14 DIAGNOSIS — Z96652 Presence of left artificial knee joint: Secondary | ICD-10-CM | POA: Diagnosis not present

## 2021-07-19 DIAGNOSIS — M25562 Pain in left knee: Secondary | ICD-10-CM | POA: Diagnosis not present

## 2021-07-19 DIAGNOSIS — Z96652 Presence of left artificial knee joint: Secondary | ICD-10-CM | POA: Diagnosis not present

## 2021-07-19 DIAGNOSIS — M25662 Stiffness of left knee, not elsewhere classified: Secondary | ICD-10-CM | POA: Diagnosis not present

## 2021-07-19 DIAGNOSIS — M6281 Muscle weakness (generalized): Secondary | ICD-10-CM | POA: Diagnosis not present

## 2021-07-28 DIAGNOSIS — M6281 Muscle weakness (generalized): Secondary | ICD-10-CM | POA: Diagnosis not present

## 2021-07-28 DIAGNOSIS — M25562 Pain in left knee: Secondary | ICD-10-CM | POA: Diagnosis not present

## 2021-07-28 DIAGNOSIS — Z96652 Presence of left artificial knee joint: Secondary | ICD-10-CM | POA: Diagnosis not present

## 2021-07-28 DIAGNOSIS — M25662 Stiffness of left knee, not elsewhere classified: Secondary | ICD-10-CM | POA: Diagnosis not present

## 2021-07-29 ENCOUNTER — Other Ambulatory Visit: Payer: Self-pay | Admitting: Family Medicine

## 2021-07-29 DIAGNOSIS — Z1231 Encounter for screening mammogram for malignant neoplasm of breast: Secondary | ICD-10-CM

## 2021-09-01 ENCOUNTER — Ambulatory Visit
Admission: RE | Admit: 2021-09-01 | Discharge: 2021-09-01 | Disposition: A | Discharge status: Home / Self Care | Payer: Medicare Other | Source: Ambulatory Visit | Attending: Family Medicine | Admitting: Family Medicine

## 2021-09-01 DIAGNOSIS — Z1231 Encounter for screening mammogram for malignant neoplasm of breast: Secondary | ICD-10-CM | POA: Diagnosis not present

## 2021-09-07 ENCOUNTER — Telehealth: Payer: Self-pay | Admitting: Family Medicine

## 2021-09-07 NOTE — Telephone Encounter (Signed)
Pt wanting to know if she needs to fst for labs for her AWV scheduled on 7/6

## 2021-09-07 NOTE — Telephone Encounter (Signed)
Patient was advised she does not need to be fasting.

## 2021-09-12 ENCOUNTER — Ambulatory Visit: Payer: Medicare Other | Admitting: Urology

## 2021-09-12 ENCOUNTER — Other Ambulatory Visit
Admission: RE | Admit: 2021-09-12 | Discharge: 2021-09-12 | Disposition: A | Discharge status: Home / Self Care | Payer: Medicare Other | Attending: Urology | Admitting: Urology

## 2021-09-12 ENCOUNTER — Other Ambulatory Visit: Payer: Self-pay

## 2021-09-12 DIAGNOSIS — N390 Urinary tract infection, site not specified: Secondary | ICD-10-CM

## 2021-09-12 LAB — URINALYSIS, COMPLETE (UACMP) WITH MICROSCOPIC
Bilirubin Urine: NEGATIVE
Glucose, UA: NEGATIVE mg/dL
Ketones, ur: NEGATIVE mg/dL
Nitrite: NEGATIVE
Protein, ur: NEGATIVE mg/dL
Specific Gravity, Urine: 1.02 (ref 1.005–1.030)
pH: 5.5 (ref 5.0–8.0)

## 2021-09-12 NOTE — Progress Notes (Signed)
As per shannon have pt go to Lab for UA and culture. Pt aware and verbalized understanding.

## 2021-09-12 NOTE — Progress Notes (Incomplete)
09/12/21 7:04 AM   Elizabeth Trujillo 06-28-1935 [id number]  Referring provider:  [name]., MD [address] [place],  [place] [address] No chief complaint on file.   Urological history  1. CIS (carcinoma in situ of bladder)/ uppper tract urothelial cancer - She underwent ablative therapy and BCG in 2019. She returned to the operating room on 12/20 for surveillance including a cystoscopy and ureteroscopy w/ NED at that time - She underwent surveillance ureteroscopy in 11/2019 Bilateral  retrograde indicated some slight narrowing of the left distal ureter with prompt drainage, ureteroscopy was unremarkable - Recent cystoscopy with Dr Erlene Quan in 06/2021 showed NED    2. Urothelial carcinoma of left distal ureter ([place]) -  CTU on 06/02/2021 visualized stable CTS of the abdomen and pelvis. No evidence of metastatic disease or recurrent urothelial malignancy. Stable left renal cortical scarring, pelvic floor laxity and small cystocele. - NED in 06/2021 during cystoscopy with Dr Erlene Quan  HPI: Elizabeth Trujillo is a 86 y.o.female      PMH: Past Medical History:  Diagnosis Date   Anemia    Arthritis    Breast cancer ([place]) 1986   left mastectomy   carcinoma in situ of urinary bladder 2019   BCG tx per pt   Collagen vascular disease ([place])    RA    Diabetes mellitus without complication (HCC)    Elevated triglycerides with high cholesterol    Family history of bladder cancer    Family history of breast cancer    Family history of lung cancer    GERD (gastroesophageal reflux disease)    history of reflux   History of cancer of ureter    Hypercholesterolemia    Hypertension    history of    Primary ureteral papillary carcinoma, left ([place]) 2019   Surgical resection and BCG  tx per pt   Seizure ([place])    last 08-2018   Stroke [facility])    TIA    Surgical History: Past Surgical History:  Procedure Laterality Date   ABDOMINAL HYSTERECTOMY  09/1984   TAH/BSO    BIOPSY Left 11/11/2018   Procedure: BIOPSY;  Surgeon: Hollice Espy, MD;  Location: [facility] ORS;  Service: Urology;  Laterality: Left;   BREAST BIOPSY Right 01/23/2013   [place] bx/clip-neg   BREAST SURGERY Left 07/1984   Mastectomy   callus removed from left toe     CATARACT EXTRACTION W/ INTRAOCULAR LENS IMPLANT Left 2015   COLONOSCOPY     CYSTOSCOPY W/ RETROGRADES Left 02/24/2019   Procedure: CYSTOSCOPY WITH RETROGRADE PYELOGRAM;  Surgeon: Hollice Espy, MD;  Location: [facility];  Service: Urology;  Laterality: Left;   CYSTOSCOPY WITH BIOPSY N/A 02/24/2019   Procedure: CYSTOSCOPY WITH Bladder BIOPSY;  Surgeon: Hollice Espy, MD;  Location: [facility];  Service: Urology;  Laterality: N/A;   CYSTOSCOPY WITH FULGERATION N/A 02/24/2019   Procedure: CYSTOSCOPY WITH FULGERATION;  Surgeon: Hollice Espy, MD;  Location: [facility];  Service: Urology;  Laterality: N/A;   CYSTOSCOPY WITH RETROGRADE PYELOGRAM, URETEROSCOPY AND STENT PLACEMENT Left 11/24/2019   Procedure: CYSTOSCOPY WITH RETROGRADE PYELOGRAM, URETEROSCOPY;  Surgeon: Hollice Espy, MD;  Location: [facility];  Service: Urology;  Laterality: Left;   CYSTOSCOPY WITH URETEROSCOPY AND STENT PLACEMENT Left 10/14/2018   Procedure: CYSTOSCOPY WITH URETEROSCOPY AND STENT PLACEMENT;  Surgeon: Hollice Espy, MD;  Location: [facility];  Service: Urology;  Laterality: Left;   CYSTOSCOPY WITH URETEROSCOPY AND STENT PLACEMENT Left 02/24/2019   Procedure: CYSTOSCOPY WITH URETEROSCOPY  AND STENT Exchange;  Surgeon: Hollice Espy, MD;  Location: [facility];  Service: Urology;  Laterality: Left;   CYSTOSCOPY/URETEROSCOPY/HOLMIUM LASER/STENT PLACEMENT Left 11/11/2018   Procedure: CYSTOSCOPY/URETEROSCOPY//STENT EXCHANGE;  Surgeon: Hollice Espy, MD;  Location: [facility];  Service: Urology;  Laterality: Left;   EYE SURGERY Left 2013   FOOT SURGERY Left 04/2013   HOLMIUM LASER APPLICATION Left 13/24/4010   Procedure: HOLMIUM LASER APPLICATION;  Surgeon:  Hollice Espy, MD;  Location: [facility];  Service: Urology;  Laterality: Left;   JOINT REPLACEMENT     KNEE ARTHROPLASTY Right 11/03/2015   Procedure: COMPUTER ASSISTED TOTAL KNEE ARTHROPLASTY;  Surgeon: Dereck Leep, MD;  Location: [facility];  Service: Orthopedics;  Laterality: Right;   MASTECTOMY Left 1986   positive   TOTAL KNEE ARTHROPLASTY Left 03/23/2021   Procedure: TOTAL KNEE ARTHROPLASTY;  Surgeon: Lovell Sheehan, MD;  Location: [facility];  Service: Orthopedics;  Laterality: Left;   URETERAL BIOPSY Left 10/14/2018   Procedure: URETERAL BIOPSY;  Surgeon: Hollice Espy, MD;  Location: [facility];  Service: Urology;  Laterality: Left;   URETERAL BIOPSY Left 02/24/2019   Procedure: URETERAL BIOPSY;  Surgeon: Hollice Espy, MD;  Location: [facility];  Service: Urology;  Laterality: Left;    Home Medications:  Allergies as of 09/12/2021       Reactions   Penicillin V Potassium Hives   Has patient had a PCN reaction causing immediate rash, facial/tongue/throat swelling, SOB or lightheadedness with hypotension: no Has patient had a PCN reaction causing severe rash involving mucus membranes or skin necrosis: {no Has patient had a PCN reaction that required hospitalization no Has patient had a PCN reaction occurring within the last 10 years: no If all of the above answers are "NO", then may proceed with Cephalosporin use.   Sulfa Antibiotics Hives, Itching        Medication List        Accurate as of September 12, 2021  7:04 AM. If you have any questions, ask your nurse or doctor.          acetaminophen 500 MG tablet Commonly known as: TYLENOL Take 1,000 mg by mouth every 6 (six) hours as needed for moderate pain (pain.).   aspirin 81 MG chewable tablet Chew 1 tablet (81 mg total) by mouth 2 (two) times daily.   atorvastatin 40 MG tablet Commonly known as: LIPITOR Take 1 tablet by mouth once daily   AZO DINE PO Take 100 mg by mouth as needed (dysuria).   CALCIUM+D3  PO Take 1 tablet by mouth daily.   cholecalciferol 25 MCG (1000 UNIT) tablet Commonly known as: VITAMIN D Take 1,000 Units by mouth at bedtime.   HYDROcodone-acetaminophen 5-325 MG tablet Commonly known as: NORCO/VICODIN Take 1 tablet by mouth every 4 (four) hours as needed for moderate pain (pain).   hydrocortisone 2.5 % ointment Apply topically as needed (rash; hemorrhoids).   ketoconazole 2 % shampoo Commonly known as: NIZORAL Apply 1 application topically as needed (flaky/itchy scalp). Up to once per week   levETIRAcetam 500 MG tablet Commonly known as: KEPPRA Take 500 mg by mouth 2 (two) times daily.   loperamide 2 MG capsule Commonly known as: IMODIUM Take 2 mg by mouth as needed for diarrhea or loose stools.   metFORMIN 500 MG tablet Commonly known as: GLUCOPHAGE Take 1 tablet by mouth once daily with breakfast   methocarbamol 500 MG tablet Commonly known as: ROBAXIN Take 1 tablet (500 mg total) by mouth every 6 (six)  hours as needed for muscle spasms.   multivitamin with minerals Tabs tablet Take 1 tablet by mouth daily at 2 PM.   nitrofurantoin (macrocrystal-monohydrate) 100 MG capsule Commonly known as: MACROBID Take 1 capsule (100 mg total) by mouth every 12 (twelve) hours. Take 1 capsule (100 mg total) by mouth every 12 (twelve) hours. Start this medication 3 days before your scheduled cystoscopy   nystatin cream Commonly known as: MYCOSTATIN Apply 1 application topically daily as needed for dry skin (fungal infection).   psyllium 28 % packet Commonly known as: METAMUCIL SMOOTH TEXTURE Take 1 packet by mouth 2 (two) times daily. 2 -3 tablespoons a day   simethicone 80 MG chewable tablet Commonly known as: MYLICON Chew 80 mg by mouth every 6 (six) hours as needed for flatulence.   simethicone 125 MG chewable tablet Commonly known as: MYLICON Chew 382 mg by mouth every 6 (six) hours as needed for flatulence (take 2 tablets).   traMADol 50 MG  tablet Commonly known as: ULTRAM        Allergies:  Allergies  Allergen Reactions   Penicillin V Potassium Hives    Has patient had a PCN reaction causing immediate rash, facial/tongue/throat swelling, SOB or lightheadedness with hypotension: no Has patient had a PCN reaction causing severe rash involving mucus membranes or skin necrosis: {no Has patient had a PCN reaction that required hospitalization no Has patient had a PCN reaction occurring within the last 10 years: no If all of the above answers are "NO", then may proceed with Cephalosporin use.   Sulfa Antibiotics Hives and Itching    Family History: Family History  Problem Relation Age of Onset   Glaucoma Mother    Kidney disease Mother    Arthritis Mother    Stroke Mother    Breast cancer Mother 46   Bladder Cancer Mother 105   Heart disease Father    Cancer Brother 1       lung cancer, smoker   Breast cancer Cousin        3 mat cousins dx late 72s   Breast cancer Cousin        1 mat cousin dx 55s-60s    Social History:  reports that she has never smoked. She has never used smokeless tobacco. She reports that she does not drink alcohol and does not use drugs.   Physical Exam: There were no vitals taken for this visit.  Constitutional:  Alert and oriented, No acute distress. HEENT: [place] AT, moist mucus membranes.  Trachea midline, no masses. Cardiovascular: No clubbing, cyanosis, or edema. Respiratory: Normal respiratory effort, no increased work of breathing. GI: Abdomen is soft, nontender, nondistended, no abdominal masses GU: No CVA tenderness Lymph: No cervical or inguinal lymphadenopathy. Skin: No rashes, bruises or suspicious lesions. Neurologic: Grossly intact, no focal deficits, moving all 4 extremities. Psychiatric: Normal mood and affect.  Laboratory Data:  Lab Results  Component Value Date   CREATININE 0.60 06/02/2021     Lab Results  Component Value Date   HGBA1C 5.9 (H) 02/22/2021     Urinalysis   Pertinent Imaging:   Assessment & Plan:     No follow-ups on file.  [facility] [address], [place],  [address] [phone]  I,Kailey Littlejohn,acting as a scribe for [facility], PA-C.,have documented all relevant documentation on the behalf of SHANNON MCGOWAN, PA-C,as directed by  [facility], PA-C while in the presence of [place], PA-C.

## 2021-09-14 ENCOUNTER — Telehealth: Payer: Self-pay | Admitting: Urology

## 2021-09-14 LAB — URINE CULTURE

## 2021-09-14 NOTE — Telephone Encounter (Signed)
Pt LMOM stating someone called her about abnormal lab results.  She wanted to know if she could come in tomorrow for recollection in [place] to get a cath specimen.  I saw where [facility] [place] put a note on labs, but not sure who called her.

## 2021-09-14 NOTE — Progress Notes (Deleted)
I,Elizabeth Trujillo S Elizabeth Trujillo,acting as a scribe for [name], MD.,have documented all relevant documentation on the behalf of [name], MD,as directed by  [name], MD while in the presence of [name], MD.   Annual Wellness Visit     [name], Female    DOB: [birth year], 86 y.o.   MRN: [id number] Visit Date: 09/15/2021  Today's Provider: [name], MD   No chief complaint on file.  Subjective    Elizabeth Trujillo is a 86 y.o. female who presents today for her Annual Wellness Visit. She reports consuming a {diet types:17450} diet. {Exercise:19826} She generally feels {well/fairly well/poorly:18703}. She reports sleeping {well/fairly well/poorly:18703}. She {does/does not:200015} have additional problems to discuss today.   HPI  Medications: Outpatient Medications Prior to Visit  Medication Sig   acetaminophen (TYLENOL) 500 MG tablet Take 1,000 mg by mouth every 6 (six) hours as needed for moderate pain (pain.).    aspirin 81 MG chewable tablet Chew 1 tablet (81 mg total) by mouth 2 (two) times daily.   atorvastatin (LIPITOR) 40 MG tablet Take 1 tablet by mouth once daily   Calcium Carb-Cholecalciferol (CALCIUM+D3 PO) Take 1 tablet by mouth daily.    cholecalciferol (VITAMIN D) 25 MCG (1000 UT) tablet Take 1,000 Units by mouth at bedtime.   HYDROcodone-acetaminophen (NORCO/VICODIN) 5-325 MG tablet Take 1 tablet by mouth every 4 (four) hours as needed for moderate pain (pain).   hydrocortisone 2.5 % ointment Apply topically as needed (rash; hemorrhoids).   ketoconazole (NIZORAL) 2 % shampoo Apply 1 application topically as needed (flaky/itchy scalp). Up to once per week   levETIRAcetam (KEPPRA) 500 MG tablet Take 500 mg by mouth 2 (two) times daily.   loperamide (IMODIUM) 2 MG capsule Take 2 mg by mouth as needed for diarrhea or loose stools.   metFORMIN (GLUCOPHAGE) 500 MG tablet Take 1 tablet by mouth once daily with breakfast    methocarbamol (ROBAXIN) 500 MG tablet Take 1 tablet (500 mg total) by mouth every 6 (six) hours as needed for muscle spasms.   Multiple Vitamin (MULTIVITAMIN WITH MINERALS) TABS tablet Take 1 tablet by mouth daily at 2 PM.   nitrofurantoin, macrocrystal-monohydrate, (MACROBID) 100 MG capsule Take 1 capsule (100 mg total) by mouth every 12 (twelve) hours. Take 1 capsule (100 mg total) by mouth every 12 (twelve) hours. Start this medication 3 days before your scheduled cystoscopy   nystatin cream (MYCOSTATIN) Apply 1 application topically daily as needed for dry skin (fungal infection).   Phenazopyridine HCl (AZO DINE PO) Take 100 mg by mouth as needed (dysuria).   psyllium (METAMUCIL SMOOTH TEXTURE) 28 % packet Take 1 packet by mouth 2 (two) times daily. 2 -3 tablespoons a day   simethicone (MYLICON) 101 MG chewable tablet Chew 125 mg by mouth every 6 (six) hours as needed for flatulence (take 2 tablets).   simethicone (MYLICON) 80 MG chewable tablet Chew 80 mg by mouth every 6 (six) hours as needed for flatulence.   traMADol (ULTRAM) 50 MG tablet    No facility-administered medications prior to visit.    Allergies  Allergen Reactions   Penicillin V Potassium Hives    Has patient had a PCN reaction causing immediate rash, facial/tongue/throat swelling, SOB or lightheadedness with hypotension: no Has patient had a PCN reaction causing severe rash involving mucus membranes or skin necrosis: {no Has patient had a PCN reaction that required hospitalization no Has patient had a PCN reaction  occurring within the last 10 years: no If all of the above answers are "NO", then may proceed with Cephalosporin use.   Sulfa Antibiotics Hives and Itching    Patient Care Team: [name]., MD as PCP - General (Family Medicine) Birder Robson, MD as Referring Physician (Ophthalmology) Hollice Espy, MD as Consulting Physician (Urology) Reche [place], PA-C as Consulting Physician (Orthopedic  Surgery) Jonathon Bellows, MD as Consulting Physician (Gastroenterology) Vladimir Crofts, MD as Consulting Physician (Neurology) Samara Deist, DPM as Referring Physician (Podiatry)  Review of Systems  All other systems reviewed and are negative.   Last CBC Lab Results  Component Value Date   WBC 10.8 04/20/2021   HGB 13.5 04/20/2021   HCT 41.4 04/20/2021   MCV 92 04/20/2021   MCH 29.9 04/20/2021   RDW 13.9 04/20/2021   PLT 420 95/18/8416   Last metabolic panel Lab Results  Component Value Date   GLUCOSE 99 04/20/2021   NA 144 04/20/2021   K 4.2 04/20/2021   CL 106 04/20/2021   CO2 24 04/20/2021   BUN 13 04/20/2021   CREATININE 0.60 06/02/2021   EGFR 88 04/20/2021   CALCIUM 9.4 04/20/2021   PHOS 3.0 07/30/2017   PROT 6.3 04/20/2021   ALBUMIN 3.8 04/20/2021   LABGLOB 2.5 04/20/2021   AGRATIO 1.5 04/20/2021   BILITOT 0.6 04/20/2021   ALKPHOS 132 (H) 04/20/2021   AST 13 04/20/2021   ALT 12 04/20/2021   ANIONGAP 6 03/24/2021   Last lipids Lab Results  Component Value Date   CHOL 181 04/20/2021   HDL 53 04/20/2021   LDLCALC 96 04/20/2021   TRIG 188 (H) 04/20/2021   CHOLHDL 3.4 04/20/2021   Last hemoglobin A1c Lab Results  Component Value Date   HGBA1C 5.9 (H) 02/22/2021   Last thyroid functions Lab Results  Component Value Date   TSH 1.900 04/20/2021   Last vitamin D Lab Results  Component Value Date   VD25OH 33.7 02/22/2021        Objective    Vitals: There were no vitals taken for this visit. BP Readings from Last 3 Encounters:  06/21/21 134/87  04/20/21 112/79  03/27/21 (!) 141/89   Wt Readings from Last 3 Encounters:  06/21/21 201 lb (91.2 kg)  03/23/21 201 lb 12.8 oz (91.5 kg)  03/10/21 201 lb 12.8 oz (91.5 kg)       Physical Exam ***  Most recent functional status assessment:    04/20/2021    1:52 PM  In your present state of health, do you have any difficulty performing the following activities:  Hearing? 1  Vision? 1   Difficulty concentrating or making decisions? 1  Walking or climbing stairs? 1  Dressing or bathing? 1  Doing errands, shopping? 1   Most recent fall risk assessment:    04/20/2021    1:51 PM  [place] in the past year? 0  Number falls in past yr: 0  Injury with Fall? 0  Risk for fall due to : Impaired mobility;Impaired balance/gait  Follow up Falls evaluation completed    Most recent depression screenings:    04/20/2021    1:52 PM 06/11/2019    2:54 PM  PHQ 2/9 Scores  PHQ - 2 Score 0 0  PHQ- 9 Score 2    Most recent cognitive screening:    06/11/2019    3:03 PM  6CIT Screen  What Year? 0 points  What month? 0 points  What time?  0 points  Count back from 20 0 points  Months in reverse 0 points  Repeat phrase 0 points  Total Score 0 points   Most recent Audit-C alcohol use screening    04/20/2021    1:51 PM  Alcohol Use Disorder Test (AUDIT)  1. How often do you have a drink containing alcohol? 0  2. How many drinks containing alcohol do you have on a typical day when you are drinking? 0  3. How often do you have six or more drinks on one occasion? 0  AUDIT-C Score 0   A score of 3 or more in women, and 4 or more in men indicates increased risk for alcohol abuse, EXCEPT if all of the points are from question 1   No results found for any visits on 09/15/21.  Assessment & Plan     Annual wellness visit done today including the all of the following: Reviewed patient's Family Medical History Reviewed and updated list of patient's medical providers Assessment of cognitive impairment was done Assessed patient's functional ability Established a written schedule for health screening [employer] Completed and Reviewed  Exercise Activities and Dietary recommendations  Goals      DIET - INCREASE WATER INTAKE     Recommend increasing water intake to 4-6 glasses a day.          Immunization History  Administered Date(s) Administered    Influenza, High Dose Seasonal PF 01/14/2015, 12/05/2016, 12/03/2017   Influenza-Unspecified 01/14/2019   PFIZER Comirnaty(Gray Top)Covid-19 Tri-Sucrose Vaccine 03/29/2019, 04/22/2019, 07/13/2020   PFIZER(Purple Top)SARS-COV-2 Vaccination 03/29/2019, 04/22/2019, 11/03/2019   Pneumococcal Conjugate-13 09/08/2013   Pneumococcal Polysaccharide-23 01/04/2011    Health Maintenance  Topic Date Due   FOOT EXAM  Never done   URINE MICROALBUMIN  Never done   TETANUS/TDAP  Never done   Zoster Vaccines- Shingrix (1 of 2) Never done   HEMOGLOBIN A1C  08/23/2021   INFLUENZA VACCINE  10/11/2021   OPHTHALMOLOGY EXAM  11/19/2021   DEXA SCAN  06/23/2022   Pneumonia Vaccine 57+ Years old  Completed   COVID-19 Vaccine  Completed   HPV VACCINES  Aged Out     Discussed health benefits of physical activity, and encouraged her to engage in regular exercise appropriate for her age and condition.    ***  No follow-ups on file.     {provider attestation***:1}   [name], MD  [facility] [phone] (phone) [phone] (fax)  [place]

## 2021-09-15 ENCOUNTER — Encounter: Payer: Medicare Other | Admitting: Family Medicine

## 2021-09-15 DIAGNOSIS — Z0000 Encounter for general adult medical examination without abnormal findings: Secondary | ICD-10-CM

## 2021-09-15 NOTE — Telephone Encounter (Signed)
Called patient no answer, vmail is full unable to leave message

## 2021-09-15 NOTE — Telephone Encounter (Signed)
Called and spoke with patient she states she is not having vaginal pain or burning but is still having "bladder" pressure/pain. Patient was told to keep cysto apt as scheduled and call if symptoms worsen

## 2021-09-28 ENCOUNTER — Other Ambulatory Visit: Payer: Self-pay

## 2021-09-28 DIAGNOSIS — D090 Carcinoma in situ of bladder: Secondary | ICD-10-CM

## 2021-09-28 DIAGNOSIS — N390 Urinary tract infection, site not specified: Secondary | ICD-10-CM

## 2021-09-30 ENCOUNTER — Ambulatory Visit (INDEPENDENT_AMBULATORY_CARE_PROVIDER_SITE_OTHER): Payer: Medicare Other | Admitting: Urology

## 2021-09-30 VITALS — BP 150/89 | HR 84 | Wt 204.0 lb

## 2021-09-30 DIAGNOSIS — D090 Carcinoma in situ of bladder: Secondary | ICD-10-CM

## 2021-09-30 DIAGNOSIS — N390 Urinary tract infection, site not specified: Secondary | ICD-10-CM

## 2021-09-30 DIAGNOSIS — Z86008 Personal history of in-situ neoplasm of other site: Secondary | ICD-10-CM | POA: Diagnosis not present

## 2021-09-30 MED ORDER — NITROFURANTOIN MONOHYD MACRO 100 MG PO CAPS: 100.0000 mg | ORAL_CAPSULE | Freq: Two times a day (BID) | ORAL | 0 refills | Status: DC

## 2021-09-30 NOTE — Progress Notes (Signed)
   09/30/21  CC: cysto    HPI: Elizabeth Trujillo is a 86 y.o.female with a personal history rUTIs,CIS, urothelial carcinoma of left distal ureter, and high risk hematuria, who presents today for surveillance cystoscopy.    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 She underwent ablative therapy and BCG in 2019. She returned to the operating room on 12/20 for surveillance including a cystoscopy and ureteroscopy w/ NED at that time. Personally discussed w/ radiology regarding her distal ureter malignancy.    She went to the operating room on 11/2019 for surveillance ureteroscopy.  Bilateral  retrograde indicated some slight narrowing of the left distal ureter with prompt drainage, ureteroscopy was unremarkable   CTU on 06/02/2021 visualized stable CTS of the abdomen and pelvis. No evidence of metastatic disease or recurrent urothelial malignancy. Stable left renal cortical scarring, pelvic floor laxity and small cystocele.  She reports bladder pain tat is similar to previous pain with bladder malignancy.She reports that the pain is not string and it is waxing and waning.   Vitals:   09/30/21 1434  BP: (!) 150/89  Pulse: 84   NED. A&Ox3.   No respiratory distress   Abd soft, NT, ND Normal external genitalia with patent urethral meatus  Cystoscopy Procedure Note  Patient identification was confirmed, informed consent was obtained, and patient was prepped using Betadine solution.  Lidocaine jelly was administered per urethral meatus.    Procedure: - Flexible cystoscope introduced, without any difficulty.   - Thorough search of the bladder revealed:    normal urethral meatus    normal urothelium    no stones    no ulcers     no tumors    no urethral polyps    no trabeculation  - Ureteral orifices were normal in position and appearance.  Post-Procedure: - Patient tolerated the procedure  well  Assessment/ Plan: 1. 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 30-monthcystoscopy with periprocedural abx (macrobid x 3 days starting day before procedure)   2. Urothelial carcinoma of left distal ureter ([place]  -will repeat imaging with CTU -Patient and her family are worried today about recurrence based on her nonspecific bladder pressure symptoms, in the absence of blood in her urine, and negative ureteroscopy today as well as fairly recent reassuring CT urogram, hesitant to want to pursue any additional work-up at least for the time being.  She scheduled for CT urogram in about 3 months.  We discussed the option of ureteroscopy although after discussed today, both she and her family declined.  We will continue more conservative management.  Follow-up cystoscopy in 3 months, CT urogram just prior to visit  IConley Rollsas a scribe for AHollice Espy MD.,have documented all relevant documentation on the behalf of AHollice Espy MD,as directed by  AHollice Espy MD while in the presence of AHollice Espy MD.

## 2021-10-05 ENCOUNTER — Telehealth: Payer: Self-pay

## 2021-10-05 NOTE — Telephone Encounter (Signed)
Copied from [place]. Topic: General - Inquiry >> Oct 05, 2021 11:54 AM Leilani Able wrote: Reason for CRM: Pt has an appt with Dr Darnell Level for AWV phy tomorrow and wanted to know if should fast as she felt had not done blood wk in some time, agent told pt that if does look like that it has been awhile since labs done and she probably should fast or labs can be on order and she can come in later. She states that she is used to fasting till later in the day and she will just come fasting unless she receives a cb stating not to fast. Pt has already cancelled appt and does not want to chg and only wants to have 1 visit. Also breaking daughter and husband into visit as she feels they need to be in on this visit. [phone]

## 2021-10-06 ENCOUNTER — Ambulatory Visit (INDEPENDENT_AMBULATORY_CARE_PROVIDER_SITE_OTHER): Payer: Medicare Other | Admitting: Family Medicine

## 2021-10-06 VITALS — BP 155/84 | HR 77 | Temp 97.6°F | Wt 202.0 lb

## 2021-10-06 DIAGNOSIS — I10 Essential (primary) hypertension: Secondary | ICD-10-CM

## 2021-10-06 DIAGNOSIS — E119 Type 2 diabetes mellitus without complications: Secondary | ICD-10-CM | POA: Diagnosis not present

## 2021-10-06 DIAGNOSIS — Z6838 Body mass index (BMI) 38.0-38.9, adult: Secondary | ICD-10-CM

## 2021-10-06 DIAGNOSIS — Z0000 Encounter for general adult medical examination without abnormal findings: Secondary | ICD-10-CM

## 2021-10-06 DIAGNOSIS — E7800 Pure hypercholesterolemia, unspecified: Secondary | ICD-10-CM | POA: Diagnosis not present

## 2021-10-06 DIAGNOSIS — M170 Bilateral primary osteoarthritis of knee: Secondary | ICD-10-CM

## 2021-10-06 DIAGNOSIS — C689 Malignant neoplasm of urinary organ, unspecified: Secondary | ICD-10-CM

## 2021-10-06 NOTE — Progress Notes (Signed)
Ar[place]ina P[place],acting as a scribe for [name], MD.,have documented all relevant documentation on the behalf of [name], MD,as directed by  [name], MD while in the presence of [name], MD.    Annual Wellness Visit     [name], Female    DOB: [birth year], 86 y.o.   MRN: [id number] Visit Date: 10/06/2021  Today's Provider: [name], MD   No chief complaint on file.  Subjective    Elizabeth Trujillo is a 86 y.o. female who presents today for her Annual Wellness Visit.   HPI  Patient is an 86 year old female who presents for what was supposed to be her wellness exam.  Patient has multiple problems that she would like addressed instead of the wellness.   She complains of having episodes where she will have 3-4 bowel movements in a row with each once progressing to a more soft loose stool.  She states that she has these episode on average 1 per month.   Ger next complaint is pain in her urethral area that she describes as a spasm.  She states it is similar to the sensation she had prior to surgery for her tumor.  She sees urology and they do CT every 6 months and Cysto every 3 month.  They do not feel she needs CT at this time but patient would like your opinion.  Patient had knee replacement 6 months ago and is concerned that she is not doing better than she is.  She has back discomfort and she is concerned that she is walking off balance and her knee is pulling inward.  She has no pain in the knee.   Medications: Outpatient Medications Prior to Visit  Medication Sig   acetaminophen (TYLENOL) 500 MG tablet Take 1,000 mg by mouth every 6 (six) hours as needed for moderate pain (pain.).    aspirin 81 MG chewable tablet Chew 1 tablet (81 mg total) by mouth 2 (two) times daily.   atorvastatin (LIPITOR) 40 MG tablet Take 1 tablet by mouth once daily   Calcium Carb-Cholecalciferol (CALCIUM+D3 PO) Take 1 tablet by mouth  daily.    cholecalciferol (VITAMIN D) 25 MCG (1000 UT) tablet Take 1,000 Units by mouth at bedtime.   donepezil (ARICEPT) 5 MG tablet Take 5 mg by mouth every morning.   hydrocortisone 2.5 % ointment Apply topically as needed (rash; hemorrhoids).   ketoconazole (NIZORAL) 2 % shampoo Apply 1 application topically as needed (flaky/itchy scalp). Up to once per week   levETIRAcetam (KEPPRA) 500 MG tablet Take 500 mg by mouth 2 (two) times daily.   loperamide (IMODIUM) 2 MG capsule Take 2 mg by mouth as needed for diarrhea or loose stools.   metFORMIN (GLUCOPHAGE) 500 MG tablet Take 1 tablet by mouth once daily with breakfast   Multiple Vitamin (MULTIVITAMIN WITH MINERALS) TABS tablet Take 1 tablet by mouth daily at 2 PM.   nitrofurantoin, macrocrystal-monohydrate, (MACROBID) 100 MG capsule Take 1 capsule (100 mg total) by mouth every 12 (twelve) hours. Take 1 capsule (100 mg total) by mouth every 12 (twelve) hours. Start this medication 3 days before your scheduled cystoscopy   nystatin cream (MYCOSTATIN) Apply 1 application topically daily as needed for dry skin (fungal infection).   Phenazopyridine HCl (AZO DINE PO) Take 100 mg by mouth as needed (dysuria).   psyllium (METAMUCIL SMOOTH TEXTURE) 28 % packet Take 1 packet by mouth 2 (two) times daily.  2 -3 tablespoons a day   simethicone (MYLICON) 226 MG chewable tablet Chew 125 mg by mouth every 6 (six) hours as needed for flatulence (take 2 tablets).   simethicone (MYLICON) 80 MG chewable tablet Chew 80 mg by mouth every 6 (six) hours as needed for flatulence.   traMADol (ULTRAM) 50 MG tablet    No facility-administered medications prior to visit.    Allergies  Allergen Reactions   Penicillin V Potassium Hives    Has patient had a PCN reaction causing immediate rash, facial/tongue/throat swelling, SOB or lightheadedness with hypotension: no Has patient had a PCN reaction causing severe rash involving mucus membranes or skin necrosis: {no Has  patient had a PCN reaction that required hospitalization no Has patient had a PCN reaction occurring within the last 10 years: no If all of the above answers are "NO", then may proceed with Cephalosporin use.   Sulfa Antibiotics Hives and Itching    Patient Care Team: [name]., MD as PCP - General (Family Medicine) Birder Robson, MD as Referring Physician (Ophthalmology) Hollice Espy, MD as Consulting Physician (Urology) Reche Dixon, PA-C as Consulting Physician (Orthopedic Surgery) Jonathon Bellows, MD as Consulting Physician (Gastroenterology) Vladimir Crofts, MD as Consulting Physician (Neurology) Samara Deist, DPM as Referring Physician (Podiatry)  Review of Systems  Constitutional: Negative.   HENT: Negative.    Eyes: Negative.   Respiratory: Negative.    Cardiovascular: Negative.   Gastrointestinal: Negative.   Endocrine: Negative.   Genitourinary: Negative.   Musculoskeletal: Negative.   Skin: Negative.   Allergic/Immunologic: Negative.   Neurological:  Positive for numbness.  Hematological: Negative.   Psychiatric/Behavioral: Negative.      Last metabolic panel Lab Results  Component Value Date   GLUCOSE 99 04/20/2021   NA 144 04/20/2021   K 4.2 04/20/2021   CL 106 04/20/2021   CO2 24 04/20/2021   BUN 13 04/20/2021   CREATININE 0.60 06/02/2021   EGFR 88 04/20/2021   CALCIUM 9.4 04/20/2021   PHOS 3.0 07/30/2017   PROT 6.3 04/20/2021   ALBUMIN 3.8 04/20/2021   LABGLOB 2.5 04/20/2021   AGRATIO 1.5 04/20/2021   BILITOT 0.6 04/20/2021   ALKPHOS 132 (H) 04/20/2021   AST 13 04/20/2021   ALT 12 04/20/2021   ANIONGAP 6 03/24/2021        Objective    Vitals: There were no vitals taken for this visit. BP Readings from Last 3 Encounters:  10/06/21 (!) 155/84  09/30/21 (!) 150/89  06/21/21 134/87   Wt Readings from Last 3 Encounters:  10/06/21 202 lb (91.6 kg)  09/30/21 204 lb (92.5 kg)  06/21/21 201 lb (91.2 kg)       Physical  Exam Constitutional:      Appearance: Normal appearance. She is obese.  HENT:     Head: Normocephalic and atraumatic.     Right Ear: Tympanic membrane, ear canal and external ear normal.     Left Ear: Tympanic membrane, ear canal and external ear normal.     Nose: Nose normal.     Mouth/Throat:     Mouth: Mucous membranes are moist.     Pharynx: Oropharynx is clear.  Eyes:     Extraocular Movements: Extraocular movements intact.     Conjunctiva/sclera: Conjunctivae normal.     Pupils: Pupils are equal, round, and reactive to light.  Cardiovascular:     Rate and Rhythm: Normal rate and regular rhythm.     Pulses: Normal pulses.  Heart sounds: Normal heart sounds.  Pulmonary:     Effort: Pulmonary effort is normal.     Breath sounds: Normal breath sounds.  Abdominal:     General: Bowel sounds are normal.     Palpations: Abdomen is soft.  Musculoskeletal:     Cervical back: Normal range of motion and neck supple.  Skin:    General: Skin is warm and dry.  Neurological:     General: No focal deficit present.     Mental Status: She is alert and oriented to person, place, and time. Mental status is at baseline.  Psychiatric:        Mood and Affect: Mood normal.        Behavior: Behavior normal.        Thought Content: Thought content normal.        Judgment: Judgment normal.      Most recent functional status assessment:    04/20/2021    1:52 PM  In your present state of health, do you have any difficulty performing the following activities:  Hearing? 1  Vision? 1  Difficulty concentrating or making decisions? 1  Walking or climbing stairs? 1  Dressing or bathing? 1  Doing errands, shopping? 1   Most recent fall risk assessment:    04/20/2021    1:51 PM  [place] in the past year? 0  Number falls in past yr: 0  Injury with Fall? 0  Risk for fall due to : Impaired mobility;Impaired balance/gait  Follow up Falls evaluation completed    Most recent  depression screenings:    04/20/2021    1:52 PM 06/11/2019    2:54 PM  PHQ 2/9 Scores  PHQ - 2 Score 0 0  PHQ- 9 Score 2    Most recent cognitive screening:    06/11/2019    3:03 PM  6CIT Screen  What Year? 0 points  What month? 0 points  What time? 0 points  Count back from 20 0 points  Months in reverse 0 points  Repeat phrase 0 points  Total Score 0 points   Most recent Audit-C alcohol use screening    04/20/2021    1:51 PM  Alcohol Use Disorder Test (AUDIT)  1. How often do you have a drink containing alcohol? 0  2. How many drinks containing alcohol do you have on a typical day when you are drinking? 0  3. How often do you have six or more drinks on one occasion? 0  AUDIT-C Score 0   A score of 3 or more in women, and 4 or more in men indicates increased risk for alcohol abuse, EXCEPT if all of the points are from question 1   No results found for any visits on 10/06/21.  Assessment & Plan     Annual wellness visit done today including the all of the following: Reviewed patient's Family Medical History Reviewed and updated list of patient's medical providers Assessment of cognitive impairment was done Assessed patient's functional ability Established a written schedule for health screening [place] Completed and Reviewed  Exercise Activities and Dietary recommendations  Goals      DIET - INCREASE WATER INTAKE     Recommend increasing water intake to 4-6 glasses a day.          Immunization History  Administered Date(s) Administered   Influenza, High Dose Seasonal PF 01/14/2015, 12/05/2016, 12/03/2017   Influenza-Unspecified 01/14/2019   PFIZER Comirnaty(Gray Top)Covid-19 Tri-Sucrose  Vaccine 03/29/2019, 04/22/2019, 07/13/2020   PFIZER(Purple Top)SARS-COV-2 Vaccination 03/29/2019, 04/22/2019, 11/03/2019   Pneumococcal Conjugate-13 09/08/2013   Pneumococcal Polysaccharide-23 01/04/2011    Health Maintenance  Topic Date Due   FOOT  EXAM  Never done   Diabetic kidney evaluation - Urine ACR  Never done   TETANUS/TDAP  Never done   Zoster Vaccines- Shingrix (1 of 2) Never done   HEMOGLOBIN A1C  08/23/2021   INFLUENZA VACCINE  10/11/2021   OPHTHALMOLOGY EXAM  11/19/2021   Diabetic kidney evaluation - GFR measurement  04/20/2022   DEXA SCAN  06/23/2022   Pneumonia Vaccine 40+ Years old  Completed   COVID-19 Vaccine  Completed   HPV VACCINES  Aged Out     Discussed health benefits of physical activity, and encouraged her to engage in regular exercise appropriate for her age and condition.    1. Medicare annual wellness visit, subsequent   2. Primary hypertension Check home blood pressure readings. - CBC with Differential/Platelet - Comprehensive metabolic panel - Lipid Panel With LDL/HDL Ratio - Hemoglobin A1c - TSH  3. Hypercholesterolemia  - CBC with Differential/Platelet - Comprehensive metabolic panel - Lipid Panel With LDL/HDL Ratio - Hemoglobin A1c - TSH  4. Type 2 diabetes mellitus without complication, without long-term current use of insulin (HCC) Goal A1c less than 8 - CBC with Differential/Platelet - Comprehensive metabolic panel - Lipid Panel With LDL/HDL Ratio - Hemoglobin A1c - TSH  5. Primary osteoarthritis of both knees Post bilateral knee replacements. I think patient needs to follow-up with physical therapy as she has no pain.  He does not move around much at all.  6. Class 2 severe obesity due to excess calories with serious comorbidity and body mass index (BMI) of 38.0 to 38.9 in adult [facility]) Diet and exercise stressed.  7. 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[place]) She is having very vague complaints of pressure in her elevated suprapubic area.  No urinary symptoms.  She is undergoing routine follow-up in including scans by urology. We will discuss with urology may be physical therapy through hospital for her pelvic floor as her cord is probably weak just like the rest of her I am not sure  what else to offer her as I do not think she needs any invasive work-up. 8.  Loose stools These occur like once a month.  Would simply recommend Metamucil at this time.   No follow-ups on file.     I, [name], MD, have reviewed all documentation for this visit. The documentation on 10/07/21 for the exam, diagnosis, procedures, and orders are all accurate and complete.    Chigozie Basaldua Cranford Mon, MD  [facility] [phone] (phone) [phone] (fax)  [place]

## 2021-10-08 ENCOUNTER — Emergency Department
Admission: EM | Admit: 2021-10-08 | Discharge: 2021-10-08 | Disposition: A | Discharge status: Home / Self Care | Payer: Medicare Other | Attending: Emergency Medicine | Admitting: Emergency Medicine

## 2021-10-08 ENCOUNTER — Other Ambulatory Visit: Payer: Self-pay

## 2021-10-08 ENCOUNTER — Encounter: Payer: Self-pay | Admitting: Emergency Medicine

## 2021-10-08 DIAGNOSIS — R4781 Slurred speech: Secondary | ICD-10-CM | POA: Diagnosis not present

## 2021-10-08 DIAGNOSIS — R29810 Facial weakness: Secondary | ICD-10-CM | POA: Diagnosis not present

## 2021-10-08 DIAGNOSIS — R569 Unspecified convulsions: Secondary | ICD-10-CM | POA: Diagnosis not present

## 2021-10-08 DIAGNOSIS — R4701 Aphasia: Secondary | ICD-10-CM | POA: Diagnosis not present

## 2021-10-08 DIAGNOSIS — R258 Other abnormal involuntary movements: Secondary | ICD-10-CM | POA: Insufficient documentation

## 2021-10-08 DIAGNOSIS — G459 Transient cerebral ischemic attack, unspecified: Secondary | ICD-10-CM | POA: Diagnosis not present

## 2021-10-08 NOTE — ED Provider Notes (Signed)
[facility] Provider Note    Event Date/Time   First MD Initiated Contact with Patient 10/08/21 1634     (approximate)   History   Difficulty finding words  HPI  Elizabeth Trujillo is a 86 y.o. 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  The patient herself states that she initially felt 1 side of her mouth being drawn up and back in and the other side drew up and back.  Family states they only observe her right side being drawn back.  This was only her mouth.  This symptom as well as for the symptoms that she typically gets with her episodes resolved fairly quickly.  Physical Exam   Triage Vital Signs: ED Triage Vitals  Enc Vitals Group     BP 10/08/21 1610 (!) 155/73     Pulse Rate 10/08/21 1610 63     Resp 10/08/21 1610 18     Temp 10/08/21 1610 97.7 F (36.5 C)     Temp Source 10/08/21 1610 Oral     SpO2 10/08/21 1610 96 %     Weight 10/08/21 1605 202 lb (91.6 kg)     Height 10/08/21 1605 '5\' 3"'$  (1.6 m)     Head Circumference --      Peak Flow --      Pain Score 10/08/21 1605 0     Pain Loc --      Pain Edu? --      Excl. in [place]? --     Most recent vital signs: Vitals:   10/08/21 1610  BP: (!) 155/73  Pulse: 63  Resp: 18  Temp: 97.7 F (36.5 C)  SpO2: 96%   General: Awake, alert, oriented. CV:  Good peripheral perfusion.  Resp:  Normal effort.  Abd:  No distention.  Other:  Face symmetric.  PERRL. EOMI. Tongue midline.   ED Results / Procedures / Treatments   Labs (all labs ordered are listed, but only abnormal results are displayed) Labs Reviewed - No data to  display   EKG  None   RADIOLOGY None   PROCEDURES:  Critical Care performed: No  Procedures   MEDICATIONS ORDERED IN ED: Medications - No data to display   IMPRESSION / MDM / [place] / ED COURSE  I reviewed the triage vital signs and the nursing notes.                              Differential diagnosis includes, but is not limited to, seizure, stroke.  Patient's presentation is most consistent with acute presentation with potential threat to life or bodily function.  Patient presented to the emergency department today because of concerns for an episode that she has had in the past however this time with some mouth drawing.  The time my exam patient is back to her baseline.  Per chart review she has seen neurology and has been having similar episodes for a long time.  Discussed with family they also state that the daughter had one time noticed that her mouth due to his side.  At this time given the patient  is back to baseline I do wonder if patient simply had another one of her episodes.  We additionally discussed possible TIA although I think this would be less likely given that this happened during one of her episodes.  We did discuss potentially working this up however patient and family felt comfortable deferring work-up at this time.  I think this is completely reasonable since she has been worked up by neurology in the past.  I did encourage patient to contact her neurologist Monday to arrange follow up.   FINAL CLINICAL IMPRESSION(S) / ED DIAGNOSES   Final diagnoses:  Seizure-like activity ([facility])        Note:  This document was prepared using Dragon voice recognition software and may include unintentional dictation errors.    Elizabeth Pear, MD 10/08/21 2003

## 2021-10-08 NOTE — ED Triage Notes (Signed)
Pt in via EMS from home with c/o right sided facial drooping and slurred speech. EMS reports sx's resolved by the time first responders got there. Family reports hx of similar episodes and states that her neurologist has her on seizure medications for it because they felt like it was seizures. Family reports they were concerned this time due to the facial drooping. Pt reports was eating late brunch when it occurred. EMS reports they were alerted at 14:55. 133/76, HR 73, 95% RA, 134 CBG

## 2021-10-08 NOTE — ED Triage Notes (Signed)
Pt reports has just finished cooking some egg salad for a sandwich and she was trying to talk to her daughter and her husband and could not come out with the words she wanted to use. Pt reports her husband asked her if she was having trouble because she had had these episodes before and she said yes. Pt reports in the past she has also had trouble communicating so this was the same. Pt reports per Dr. Manuella Ghazi the pattern is seizures and she wouldn't need to go to the ER but the fire dept told her she should go. Pt reports the only difference this time was she felt a pulling sensation on her lips that she had never felt before.

## 2021-10-08 NOTE — ED Notes (Signed)
Pt verbalized understanding of discharge instructions and follow-up care instructions. Pt advised if symptoms worsen to return to ED.  

## 2021-10-08 NOTE — Discharge Instructions (Signed)
Please be sure to follow up with Dr. Manuella Ghazi. Please seek medical attention for any high fevers, chest pain, shortness of breath, change in behavior, persistent vomiting, bloody stool or any other new or concerning symptoms.

## 2021-10-08 NOTE — ED Notes (Addendum)
Pt presents to ED with slurred speech. Pt states that today while talking to her daughter she could not find her words and felt a pulling sensation around her lips. Per pt and husband, the episode lasted about 30 mins. Pt is A&Ox4, speech is clear. Pt denies having the pulling sensation at this time. Pt denies any blurry vision. Pt ambulatory at this time.   Pt states she has had these episodes before and has a neurologist that she sees for it.

## 2021-10-10 ENCOUNTER — Telehealth: Payer: Self-pay

## 2021-10-10 NOTE — Telephone Encounter (Signed)
Copied from [place]. Topic: General - Other >> Oct 07, 2021  4:06 PM [place] B wrote: Reason for ZOX:[id number] called in wants a printout mailed to her  of what was done and discussed at her appt on 07/27. She says she couldn't find it in My Chart. [place]  [place] [phone]

## 2021-10-10 NOTE — Telephone Encounter (Signed)
Done

## 2021-10-18 ENCOUNTER — Encounter: Payer: Medicare Other | Admitting: Family Medicine

## 2021-11-16 ENCOUNTER — Telehealth: Payer: Self-pay

## 2021-11-16 NOTE — Telephone Encounter (Signed)
Copied from [place] [phone]. Topic: General - Call Back - No Documentation >> Nov 16, 2021  4:36 PM Oley Balm E wrote: Reason for CRM: Pt has questions for her PCP she wants his advice, she declined an appt. She wants to know if PCP would recommend that she gets another covid vaccine injection. Please advise  Best contact: [phone]

## 2021-11-17 NOTE — Telephone Encounter (Signed)
Please advise 

## 2021-11-28 ENCOUNTER — Telehealth: Payer: Self-pay | Admitting: Urology

## 2021-11-28 NOTE — Telephone Encounter (Signed)
Patient called the triage line today to report that she is experiencing blood in her urine (showed up this weekend x3); in addition to the persistent bladder pain.  She describes the pain similar to previous pain with bladder malignancy.She reports that the pain is waxing and waning and not constant.    Patient is quite concerned and wondering if we should change the plan of care.  Please advise.

## 2021-11-28 NOTE — Telephone Encounter (Signed)
Spoke with patient and appointment scheduled 

## 2021-11-28 NOTE — Telephone Encounter (Signed)
Please bring her in on [place] or [place] schedule for rule out UTI with UA/urine culture.    She is due for a CT urogram and cystoscopy next month so stay the course on that front.  Hollice Espy, MD

## 2021-11-29 ENCOUNTER — Ambulatory Visit (INDEPENDENT_AMBULATORY_CARE_PROVIDER_SITE_OTHER): Payer: Medicare Other | Admitting: Urology

## 2021-11-29 VITALS — BP 148/85 | HR 81 | Ht 62.0 in | Wt 204.0 lb

## 2021-11-29 DIAGNOSIS — R310 Gross hematuria: Secondary | ICD-10-CM

## 2021-11-29 DIAGNOSIS — R9082 White matter disease, unspecified: Secondary | ICD-10-CM | POA: Diagnosis not present

## 2021-11-29 DIAGNOSIS — G43109 Migraine with aura, not intractable, without status migrainosus: Secondary | ICD-10-CM | POA: Diagnosis not present

## 2021-11-29 DIAGNOSIS — Z79899 Other long term (current) drug therapy: Secondary | ICD-10-CM | POA: Diagnosis not present

## 2021-11-29 DIAGNOSIS — N390 Urinary tract infection, site not specified: Secondary | ICD-10-CM

## 2021-11-29 DIAGNOSIS — R569 Unspecified convulsions: Secondary | ICD-10-CM | POA: Diagnosis not present

## 2021-11-29 DIAGNOSIS — G3184 Mild cognitive impairment, so stated: Secondary | ICD-10-CM | POA: Diagnosis not present

## 2021-11-29 LAB — URINALYSIS, COMPLETE
Bilirubin, UA: NEGATIVE
Glucose, UA: NEGATIVE
Ketones, UA: NEGATIVE
Nitrite, UA: NEGATIVE
Specific Gravity, UA: 1.015 (ref 1.005–1.030)
Urobilinogen, Ur: 0.2 mg/dL (ref 0.2–1.0)
pH, UA: 6 (ref 5.0–7.5)

## 2021-11-29 LAB — MICROSCOPIC EXAMINATION
Epithelial Cells (non renal): >10 /hpf — AB (ref 0–10)
RBC, Urine: >30 /hpf — AB (ref 0–2)
WBC, UA: >30 /hpf — AB (ref 0–5)

## 2021-11-29 LAB — BLADDER SCAN AMB NON-IMAGING: Scan Result: 0

## 2021-11-29 MED ORDER — NITROFURANTOIN MONOHYD MACRO 100 MG PO CAPS: 100.0000 mg | ORAL_CAPSULE | Freq: Two times a day (BID) | ORAL | 0 refills | Status: DC

## 2021-11-29 NOTE — Progress Notes (Signed)
11/29/2021 1:14 PM   Elizabeth Trujillo 07-Feb-1936 [id number]  Referring provider: [name]., MD [address] [place],  [place] [address]  Chief Complaint  Patient presents with   Hematuria    HPI: 86 year old patient with a personal history of left distal ureteral TCC who returns today sooner than scheduled for complaints of gross hematuria.  She is accompanied by her daughter today.  She reports over the weekend, she had 3 episodes of painless gross hematuria.  She also had some chronic intermittent left lower quadrant pain which is unchanged.  She is concerned because she has felt discomfort in her clitoris where she first presented when she had the ureteral cancer.  She is worried about a recurrence.  This was similar to her concerns at her last visit.  Urinalysis today is frankly positive with greater than 30 RBCs, WBCs and bacteria.  Unfortunately, there is none of urine for culture today.  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  She just started using nystatin cream/powder again yesterday for this.  Seems to be very irritated.   PMH: Past Medical History:  Diagnosis Date   Anemia    Arthritis    Breast cancer ([place]) 1986   left mastectomy   carcinoma in situ of urinary bladder 2019   BCG tx per pt   Collagen vascular disease ([place])    RA    Diabetes mellitus without complication (HCC)    Elevated triglycerides with high cholesterol    Family history of bladder cancer    Family history of breast cancer    Family history of lung cancer    GERD (gastroesophageal reflux disease)    history of reflux   History of cancer of ureter    Hypercholesterolemia    Hypertension    history of    Primary ureteral papillary carcinoma, left ([place]) 2019   Surgical resection and BCG  tx per pt   Seizure ([place])    last 08-2018   Stroke [facility])    TIA    Surgical History: Past Surgical History:   Procedure Laterality Date   ABDOMINAL HYSTERECTOMY  09/1984   TAH/BSO   BIOPSY Left 11/11/2018   Procedure: BIOPSY;  Surgeon: Hollice Espy, MD;  Location: [facility];  Service: Urology;  Laterality: Left;   BREAST BIOPSY Right 01/23/2013   [place] bx/clip-neg   BREAST SURGERY Left 07/1984   Mastectomy   callus removed from left toe     CATARACT EXTRACTION W/ INTRAOCULAR LENS IMPLANT Left 2015   COLONOSCOPY     CYSTOSCOPY W/ RETROGRADES Left 02/24/2019   Procedure: CYSTOSCOPY WITH RETROGRADE PYELOGRAM;  Surgeon: Hollice Espy, MD;  Location: [facility];  Service: Urology;  Laterality: Left;   CYSTOSCOPY WITH BIOPSY N/A 02/24/2019   Procedure: CYSTOSCOPY WITH Bladder BIOPSY;  Surgeon: Hollice Espy, MD;  Location: [facility];  Service: Urology;  Laterality: N/A;   CYSTOSCOPY WITH FULGERATION N/A 02/24/2019   Procedure: CYSTOSCOPY WITH FULGERATION;  Surgeon: Hollice Espy, MD;  Location: [facility];  Service: Urology;  Laterality: N/A;   CYSTOSCOPY WITH RETROGRADE PYELOGRAM, URETEROSCOPY AND STENT PLACEMENT Left 11/24/2019   Procedure: CYSTOSCOPY WITH RETROGRADE PYELOGRAM, URETEROSCOPY;  Surgeon: Hollice Espy, MD;  Location: [facility];  Service: Urology;  Laterality: Left;   CYSTOSCOPY WITH URETEROSCOPY AND STENT PLACEMENT Left 10/14/2018   Procedure: CYSTOSCOPY WITH URETEROSCOPY AND STENT PLACEMENT;  Surgeon: Hollice Espy, MD;  Location: [facility];  Service: Urology;  Laterality: Left;   CYSTOSCOPY WITH URETEROSCOPY AND STENT PLACEMENT Left 02/24/2019   Procedure: CYSTOSCOPY WITH URETEROSCOPY AND STENT Exchange;  Surgeon: Hollice Espy, MD;  Location: [facility];  Service: Urology;  Laterality: Left;   CYSTOSCOPY/URETEROSCOPY/HOLMIUM LASER/STENT PLACEMENT Left 11/11/2018   Procedure: CYSTOSCOPY/URETEROSCOPY//STENT EXCHANGE;  Surgeon: Hollice Espy, MD;  Location: [facility];  Service: Urology;  Laterality: Left;   EYE SURGERY Left 2013   FOOT SURGERY Left 04/2013   HOLMIUM LASER  APPLICATION Left 64/33/2951   Procedure: HOLMIUM LASER APPLICATION;  Surgeon: Hollice Espy, MD;  Location: [facility];  Service: Urology;  Laterality: Left;   JOINT REPLACEMENT     KNEE ARTHROPLASTY Right 11/03/2015   Procedure: COMPUTER ASSISTED TOTAL KNEE ARTHROPLASTY;  Surgeon: Dereck Leep, MD;  Location: [facility];  Service: Orthopedics;  Laterality: Right;   MASTECTOMY Left 1986   positive   TOTAL KNEE ARTHROPLASTY Left 03/23/2021   Procedure: TOTAL KNEE ARTHROPLASTY;  Surgeon: Lovell Sheehan, MD;  Location: [facility];  Service: Orthopedics;  Laterality: Left;   URETERAL BIOPSY Left 10/14/2018   Procedure: URETERAL BIOPSY;  Surgeon: Hollice Espy, MD;  Location: [facility];  Service: Urology;  Laterality: Left;   URETERAL BIOPSY Left 02/24/2019   Procedure: URETERAL BIOPSY;  Surgeon: Hollice Espy, MD;  Location: [facility];  Service: Urology;  Laterality: Left;    Home Medications:  Allergies as of 11/29/2021       Reactions   Penicillin V Potassium Hives   Has patient had a PCN reaction causing immediate rash, facial/tongue/throat swelling, SOB or lightheadedness with hypotension: no Has patient had a PCN reaction causing severe rash involving mucus membranes or skin necrosis: {no Has patient had a PCN reaction that required hospitalization no Has patient had a PCN reaction occurring within the last 10 years: no If all of the above answers are "NO", then may proceed with Cephalosporin use.   Sulfa Antibiotics Hives, Itching        Medication List        Accurate as of November 29, 2021  1:14 PM. If you have any questions, ask your nurse or doctor.          acetaminophen 500 MG tablet Commonly known as: TYLENOL Take 1,000 mg by mouth every 6 (six) hours as needed for moderate pain (pain.).   aspirin 81 MG chewable tablet Chew 1 tablet (81 mg total) by mouth 2 (two) times daily.   atorvastatin 40 MG tablet Commonly known as: LIPITOR Take 1 tablet by mouth once  daily   AZO DINE PO Take 100 mg by mouth as needed (dysuria).   CALCIUM+D3 PO Take 1 tablet by mouth daily.   cholecalciferol 25 MCG (1000 UNIT) tablet Commonly known as: VITAMIN D3 Take 1,000 Units by mouth at bedtime.   donepezil 5 MG tablet Commonly known as: ARICEPT Take 5 mg by mouth every morning.   hydrocortisone 2.5 % ointment Apply topically as needed (rash; hemorrhoids).   ketoconazole 2 % shampoo Commonly known as: NIZORAL Apply 1 application topically as needed (flaky/itchy scalp). Up to once per week   levETIRAcetam 500 MG tablet Commonly known as: KEPPRA Take 500 mg by mouth 2 (two) times daily.   loperamide 2 MG capsule Commonly known as: IMODIUM Take 2 mg by mouth as needed for diarrhea or loose stools.   metFORMIN 500 MG tablet Commonly known as: GLUCOPHAGE Take 1 tablet by mouth once daily with breakfast   multivitamin with minerals Tabs tablet Take  1 tablet by mouth daily at 2 PM.   nitrofurantoin (macrocrystal-monohydrate) 100 MG capsule Commonly known as: MACROBID Take 1 capsule (100 mg total) by mouth every 12 (twelve) hours. Take 1 capsule (100 mg total) by mouth every 12 (twelve) hours. Start this medication 3 days before your scheduled cystoscopy What changed: Another medication with the same name was added. Make sure you understand how and when to take each.   nitrofurantoin (macrocrystal-monohydrate) 100 MG capsule Commonly known as: Macrobid Take 1 capsule (100 mg total) by mouth 2 (two) times daily. What changed: You were already taking a medication with the same name, and this prescription was added. Make sure you understand how and when to take each.   nystatin cream Commonly known as: MYCOSTATIN Apply 1 application topically daily as needed for dry skin (fungal infection).   psyllium 28 % packet Commonly known as: METAMUCIL SMOOTH TEXTURE Take 1 packet by mouth 2 (two) times daily. 2 -3 tablespoons a day   simethicone 80 MG  chewable tablet Commonly known as: MYLICON Chew 80 mg by mouth every 6 (six) hours as needed for flatulence.   simethicone 125 MG chewable tablet Commonly known as: MYLICON Chew 237 mg by mouth every 6 (six) hours as needed for flatulence (take 2 tablets).   traMADol 50 MG tablet Commonly known as: ULTRAM        Allergies:  Allergies  Allergen Reactions   Penicillin V Potassium Hives    Has patient had a PCN reaction causing immediate rash, facial/tongue/throat swelling, SOB or lightheadedness with hypotension: no Has patient had a PCN reaction causing severe rash involving mucus membranes or skin necrosis: {no Has patient had a PCN reaction that required hospitalization no Has patient had a PCN reaction occurring within the last 10 years: no If all of the above answers are "NO", then may proceed with Cephalosporin use.   Sulfa Antibiotics Hives and Itching    Family History: Family History  Problem Relation Age of Onset   Glaucoma Mother    Kidney disease Mother    Arthritis Mother    Stroke Mother    Breast cancer Mother 7   Bladder Cancer Mother 95   Heart disease Father    Cancer Brother 40       lung cancer, smoker   Breast cancer Cousin        3 mat cousins dx late 30s   Breast cancer Cousin        1 mat cousin dx 32s-60s    Social History:  reports that she has never smoked. She has never used smokeless tobacco. She reports that she does not drink alcohol and does not use drugs.   Physical Exam: BP (!) 148/85   Pulse 81   Ht '5\' 2"'$  (1.575 m)   Wt 204 lb (92.5 kg)   BMI 37.31 kg/m   Constitutional:  Alert and oriented, No acute distress. HEENT:  AT, moist mucus membranes.  Trachea midline, no masses. Skin: No rashes, bruises or suspicious lesions. Neurologic: Grossly intact, no focal deficits, moving all 4 extremities. Psychiatric: Normal mood and affect.  Laboratory Data: Lab Results  Component Value Date   WBC 10.8 04/20/2021   HGB 13.5  04/20/2021   HCT 41.4 04/20/2021   MCV 92 04/20/2021   PLT 420 04/20/2021    Lab Results  Component Value Date   CREATININE 0.60 06/02/2021    Lab Results  Component Value Date   HGBA1C 5.9 (H) 02/22/2021  Assessment & Plan:    1. Recurrent UTI Urinalysis today is again frankly positive which may account for her urinary symptoms  Regarding ahead and treat her with Macrobid for 10 days based on her previous urine cultures as well as allergy profile.  Unfortunately, there is no enough urine for culture today  - Urinalysis, Complete - Bladder Scan (Post Void Residual) in office  2. Gross hematuria May be related to #1 versus #3 - Urinalysis, Complete - Bladder Scan (Post Void Residual) in office  3.  History of bladder cancer/upper tract urothelial cancer -Due for CT urogram and cystoscopy next month.  We like to keep that as scheduled for the time being.  If the CT urogram is suspicious, may consider return to the operating room for ureteroscopy.  That being said, like to treat her for this infection and see how things go.  She is agreeable with this plan.  Her daughter also agrees with this plan.  She is given the number for radiology today to go ahead and schedule her CT urogram for next month.  She will also plan to take periprocedural antibiotics as per our usual agreement.   Hollice Espy, MD  [facility] [address], [place], [place] [address] [phone]

## 2021-11-29 NOTE — Patient Instructions (Signed)
Scheduling [phone]

## 2021-12-09 ENCOUNTER — Other Ambulatory Visit
Admission: RE | Admit: 2021-12-09 | Discharge: 2021-12-09 | Disposition: A | Discharge status: Home / Self Care | Payer: Medicare Other | Attending: Family Medicine | Admitting: Family Medicine

## 2021-12-09 DIAGNOSIS — I10 Essential (primary) hypertension: Secondary | ICD-10-CM | POA: Diagnosis not present

## 2021-12-09 DIAGNOSIS — E7800 Pure hypercholesterolemia, unspecified: Secondary | ICD-10-CM | POA: Diagnosis not present

## 2021-12-09 DIAGNOSIS — E119 Type 2 diabetes mellitus without complications: Secondary | ICD-10-CM | POA: Insufficient documentation

## 2021-12-09 LAB — CBC WITH DIFFERENTIAL/PLATELET
Abs Immature Granulocytes: 0.02 10*3/uL (ref 0.00–0.07)
Basophils Absolute: 0.1 10*3/uL (ref 0.0–0.1)
Basophils Relative: 1 %
Eosinophils Absolute: 0.1 10*3/uL (ref 0.0–0.5)
Eosinophils Relative: 2 %
HCT: 39.3 % (ref 36.0–46.0)
Hemoglobin: 13.3 g/dL (ref 12.0–15.0)
Immature Granulocytes: 0 %
Lymphocytes Relative: 25 %
Lymphs Abs: 2 10*3/uL (ref 0.7–4.0)
MCH: 30.6 pg (ref 26.0–34.0)
MCHC: 33.8 g/dL (ref 30.0–36.0)
MCV: 90.3 fL (ref 80.0–100.0)
Monocytes Absolute: 0.8 10*3/uL (ref 0.1–1.0)
Monocytes Relative: 10 %
Neutro Abs: 5 10*3/uL (ref 1.7–7.7)
Neutrophils Relative %: 62 %
Platelets: 271 10*3/uL (ref 150–400)
RBC: 4.35 MIL/uL (ref 3.87–5.11)
RDW: 14.2 % (ref 11.5–15.5)
WBC: 8 10*3/uL (ref 4.0–10.5)
nRBC: 0 % (ref 0.0–0.2)

## 2021-12-09 LAB — COMPREHENSIVE METABOLIC PANEL
ALT: 17 U/L (ref 0–44)
AST: 19 U/L (ref 15–41)
Albumin: 3.3 g/dL — ABNORMAL LOW (ref 3.5–5.0)
Alkaline Phosphatase: 82 U/L (ref 38–126)
Anion gap: 6 (ref 5–15)
BUN: 15 mg/dL (ref 8–23)
CO2: 26 mmol/L (ref 22–32)
Calcium: 8.6 mg/dL — ABNORMAL LOW (ref 8.9–10.3)
Chloride: 109 mmol/L (ref 98–111)
Creatinine, Ser: 0.63 mg/dL (ref 0.44–1.00)
GFR, Estimated: >60 mL/min (ref >60)
Glucose, Bld: 108 mg/dL — ABNORMAL HIGH (ref 70–99)
Potassium: 3.6 mmol/L (ref 3.5–5.1)
Sodium: 141 mmol/L (ref 135–145)
Total Bilirubin: 0.9 mg/dL (ref 0.3–1.2)
Total Protein: 6.6 g/dL (ref 6.5–8.1)

## 2021-12-09 LAB — LIPID PANEL
Cholesterol: 173 mg/dL (ref 0–200)
HDL: 48 mg/dL (ref >40)
LDL Cholesterol: 96 mg/dL (ref 0–99)
Total CHOL/HDL Ratio: 3.6 RATIO
Triglycerides: 145 mg/dL (ref <150)
VLDL: 29 mg/dL (ref 0–40)

## 2021-12-09 LAB — HEMOGLOBIN A1C
Hgb A1c MFr Bld: 5.9 % — ABNORMAL HIGH (ref 4.8–5.6)
Mean Plasma Glucose: 122.63 mg/dL

## 2021-12-09 LAB — TSH: TSH: 2.995 u[IU]/mL (ref 0.350–4.500)

## 2021-12-20 ENCOUNTER — Ambulatory Visit
Admission: RE | Admit: 2021-12-20 | Discharge: 2021-12-20 | Disposition: A | Discharge status: Home / Self Care | Payer: Medicare Other | Source: Ambulatory Visit | Attending: Urology | Admitting: Urology

## 2021-12-20 DIAGNOSIS — D090 Carcinoma in situ of bladder: Secondary | ICD-10-CM | POA: Insufficient documentation

## 2021-12-20 DIAGNOSIS — N134 Hydroureter: Secondary | ICD-10-CM | POA: Diagnosis not present

## 2021-12-20 DIAGNOSIS — I700 Atherosclerosis of aorta: Secondary | ICD-10-CM | POA: Diagnosis not present

## 2021-12-20 MED ORDER — IOHEXOL 300 MG/ML  SOLN
125.0000 mL | Freq: Once | INTRAMUSCULAR | Status: AC | PRN
  Administered 2021-12-20: 125 mL via INTRAVENOUS

## 2021-12-22 ENCOUNTER — Other Ambulatory Visit: Payer: Self-pay | Admitting: Family Medicine

## 2021-12-22 DIAGNOSIS — E7800 Pure hypercholesterolemia, unspecified: Secondary | ICD-10-CM

## 2021-12-22 NOTE — Telephone Encounter (Signed)
Requested medication (s) are due for refill today: yes  Requested medication (s) are on the active medication list: yes  Last refill:  12/21/20 #90 with 3 RF  Future visit scheduled: no, seen 10/06/21  Notes to clinic:  Dr Alben Spittle pt, please assess.      Requested Prescriptions  Pending Prescriptions Disp Refills   atorvastatin (LIPITOR) 40 MG tablet [Pharmacy Med Name: Atorvastatin Calcium 40 MG Oral Tablet] 90 tablet 0    Sig: Take 1 tablet by mouth once daily     Cardiovascular:  Antilipid - Statins Failed - 12/22/2021 11:52 AM      Failed - Lipid Panel in normal range within the last 12 months    Cholesterol, Total  Date Value Ref Range Status  04/20/2021 181 100 - 199 mg/dL Final   Cholesterol  Date Value Ref Range Status  12/09/2021 173 0 - 200 mg/dL Final   LDL Cholesterol (Calc)  Date Value Ref Range Status  12/05/2016 101 (H) mg/dL (calc) Final    Comment:    Reference range: <100 . Desirable range <100 mg/dL for primary prevention;   <70 mg/dL for patients with CHD or diabetic patients  with > or = 2 CHD risk factors. Marland Kitchen LDL-C is now calculated using the Martin-Hopkins  calculation, which is a validated novel method providing  better accuracy than the Friedewald equation in the  estimation of LDL-C.  Cresenciano Genre et al. Annamaria Helling. 7371;062(69): 2061-2068  (http://education.[facility].com/faq/FAQ164)    LDL Chol Calc (NIH)  Date Value Ref Range Status  04/20/2021 96 0 - 99 mg/dL Final   LDL Cholesterol  Date Value Ref Range Status  12/09/2021 96 0 - 99 mg/dL Final    Comment:           Total Cholesterol/HDL:CHD Risk Coronary Heart Disease Risk Table                     Men   Women  1/2 Average Risk   3.4   3.3  Average Risk       5.0   4.4  2 X Average Risk   9.6   7.1  3 X Average Risk  23.4   11.0        Use the calculated Patient Ratio above and the CHD Risk Table to determine the patient's CHD Risk.        ATP III CLASSIFICATION (LDL):   <100     mg/dL   Optimal  100-129  mg/dL   Near or Above                    Optimal  130-159  mg/dL   Borderline  160-189  mg/dL   High  >190     mg/dL   Very High Performed at [facility], [place], [place] [address]    HDL  Date Value Ref Range Status  12/09/2021 48 >40 mg/dL Final  04/20/2021 53 >39 mg/dL Final   Triglycerides  Date Value Ref Range Status  12/09/2021 145 <150 mg/dL Final         Passed - Patient is not pregnant      Passed - Valid encounter within last 12 months    Recent Outpatient Visits           2 months ago Medicare annual wellness visit, subsequent   [facility] [name]., MD   8 months ago Primary hypertension   [facility] [name]., MD   10 months ago Preop general physical exam   [facility] Birdie Sons, MD   1 year ago Type 2 diabetes mellitus without complication, without long-term current use of insulin [facility])   [facility] [name]., MD   1 year ago Hypercholesterolemia   [facility] [name]., MD       Future Appointments             In 3 months [name]., MD [facility], [place]

## 2021-12-28 ENCOUNTER — Other Ambulatory Visit: Payer: Self-pay

## 2021-12-28 DIAGNOSIS — R310 Gross hematuria: Secondary | ICD-10-CM

## 2021-12-28 DIAGNOSIS — N390 Urinary tract infection, site not specified: Secondary | ICD-10-CM

## 2021-12-30 ENCOUNTER — Other Ambulatory Visit: Payer: Self-pay | Admitting: Urology

## 2021-12-30 ENCOUNTER — Other Ambulatory Visit
Admission: RE | Admit: 2021-12-30 | Discharge: 2021-12-30 | Disposition: A | Discharge status: Home / Self Care | Payer: Medicare Other | Attending: Urology | Admitting: Urology

## 2021-12-30 ENCOUNTER — Ambulatory Visit (INDEPENDENT_AMBULATORY_CARE_PROVIDER_SITE_OTHER): Payer: Medicare Other | Admitting: Urology

## 2021-12-30 DIAGNOSIS — Z86008 Personal history of in-situ neoplasm of other site: Secondary | ICD-10-CM

## 2021-12-30 DIAGNOSIS — D090 Carcinoma in situ of bladder: Secondary | ICD-10-CM | POA: Insufficient documentation

## 2021-12-30 DIAGNOSIS — R310 Gross hematuria: Secondary | ICD-10-CM | POA: Diagnosis not present

## 2021-12-30 DIAGNOSIS — N1330 Unspecified hydronephrosis: Secondary | ICD-10-CM

## 2021-12-30 DIAGNOSIS — K639 Disease of intestine, unspecified: Secondary | ICD-10-CM

## 2021-12-30 DIAGNOSIS — N134 Hydroureter: Secondary | ICD-10-CM | POA: Diagnosis not present

## 2021-12-30 DIAGNOSIS — N390 Urinary tract infection, site not specified: Secondary | ICD-10-CM

## 2021-12-30 DIAGNOSIS — C662 Malignant neoplasm of left ureter: Secondary | ICD-10-CM

## 2021-12-30 DIAGNOSIS — Z8554 Personal history of malignant neoplasm of ureter: Secondary | ICD-10-CM

## 2021-12-30 DIAGNOSIS — R8271 Bacteriuria: Secondary | ICD-10-CM

## 2021-12-30 DIAGNOSIS — N3289 Other specified disorders of bladder: Secondary | ICD-10-CM | POA: Diagnosis not present

## 2021-12-30 LAB — URINALYSIS, COMPLETE (UACMP) WITH MICROSCOPIC
Bilirubin Urine: NEGATIVE
Glucose, UA: NEGATIVE mg/dL
Ketones, ur: NEGATIVE mg/dL
Nitrite: NEGATIVE
Protein, ur: NEGATIVE mg/dL
Specific Gravity, Urine: 1.02 (ref 1.005–1.030)
WBC, UA: >50 WBC/hpf (ref 0–5)
pH: 5 (ref 5.0–8.0)

## 2021-12-30 NOTE — H&P (View-Only) (Signed)
12/30/2021 3:29 PM   Elizabeth Trujillo Apr 16, 1935 [id number]  Referring provider: [name]., MD No address on file  No chief complaint on file.   HPI: 86 y.o.female with a personal history rUTIs,CIS, urothelial carcinoma of left distal ureter, and high risk hematuria, who presents today for surveillance cystoscopy.      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 She underwent ablative therapy and BCG in 2019. She returned to the operating room on 12/20 for surveillance including a cystoscopy and ureteroscopy w/ NED at that time. Personally discussed w/ radiology regarding her distal ureter malignancy.    She went to the operating room on 11/2019 for surveillance ureteroscopy.  Bilateral  retrograde indicated some slight narrowing of the left distal ureter with prompt drainage, ureteroscopy was unremarkable   CTU on 12/21/2021 showed interval development of moderate left-sided hydroureteronephrosis down to the left distal ureter.  There is also subtle hyperenhancement which the radiologist felt could be consistent with recurrence.  There is also questionable punctate stone at the same location which is also within the differential diagnosis.  There is also some fullness appreciated in the colon felt to likely be related to underdistention rather than mass.  She had seen GI last year for diarrhea.  Her last colonoscopy was over 13 years ago.  She is accompanied today by her daughter and husband.    PMH: Past Medical History:  Diagnosis Date   Anemia    Arthritis    Breast cancer ([place]) 1986   left mastectomy   carcinoma in situ of urinary bladder 2019   BCG tx per pt   Collagen vascular disease ([place])    RA    Diabetes mellitus without complication (HCC)    Elevated triglycerides with high cholesterol    Family history of bladder cancer    Family history of breast cancer    Family  history of lung cancer    GERD (gastroesophageal reflux disease)    history of reflux   History of cancer of ureter    Hypercholesterolemia    Hypertension    history of    Primary ureteral papillary carcinoma, left ([place]) 2019   Surgical resection and BCG  tx per pt   Seizure ([place])    last 08-2018   Stroke [facility])    TIA    Surgical History: Past Surgical History:  Procedure Laterality Date   ABDOMINAL HYSTERECTOMY  09/1984   TAH/BSO   BIOPSY Left 11/11/2018   Procedure: BIOPSY;  Surgeon: Hollice Espy, MD;  Location: [facility];  Service: Urology;  Laterality: Left;   BREAST BIOPSY Right 01/23/2013   [place] bx/clip-neg   BREAST SURGERY Left 07/1984   Mastectomy   callus removed from left toe     CATARACT EXTRACTION W/ INTRAOCULAR LENS IMPLANT Left 2015   COLONOSCOPY     CYSTOSCOPY W/ RETROGRADES Left 02/24/2019   Procedure: CYSTOSCOPY WITH RETROGRADE PYELOGRAM;  Surgeon: Hollice Espy, MD;  Location: [facility];  Service: Urology;  Laterality: Left;   CYSTOSCOPY WITH BIOPSY N/A 02/24/2019   Procedure: CYSTOSCOPY WITH Bladder BIOPSY;  Surgeon: Hollice Espy, MD;  Location: [facility];  Service: Urology;  Laterality: N/A;   CYSTOSCOPY WITH FULGERATION N/A 02/24/2019   Procedure: CYSTOSCOPY WITH FULGERATION;  Surgeon: Hollice Espy, MD;  Location: [facility];  Service: Urology;  Laterality: N/A;   CYSTOSCOPY WITH RETROGRADE PYELOGRAM, URETEROSCOPY AND STENT PLACEMENT Left 11/24/2019   Procedure:  CYSTOSCOPY WITH RETROGRADE PYELOGRAM, URETEROSCOPY;  Surgeon: Hollice Espy, MD;  Location: [facility];  Service: Urology;  Laterality: Left;   CYSTOSCOPY WITH URETEROSCOPY AND STENT PLACEMENT Left 10/14/2018   Procedure: CYSTOSCOPY WITH URETEROSCOPY AND STENT PLACEMENT;  Surgeon: Hollice Espy, MD;  Location: [facility];  Service: Urology;  Laterality: Left;   CYSTOSCOPY WITH URETEROSCOPY AND STENT PLACEMENT Left 02/24/2019   Procedure: CYSTOSCOPY WITH URETEROSCOPY AND STENT Exchange;   Surgeon: Hollice Espy, MD;  Location: [facility];  Service: Urology;  Laterality: Left;   CYSTOSCOPY/URETEROSCOPY/HOLMIUM LASER/STENT PLACEMENT Left 11/11/2018   Procedure: CYSTOSCOPY/URETEROSCOPY//STENT EXCHANGE;  Surgeon: Hollice Espy, MD;  Location: [facility];  Service: Urology;  Laterality: Left;   EYE SURGERY Left 2013   FOOT SURGERY Left 04/2013   HOLMIUM LASER APPLICATION Left 42/59/5638   Procedure: HOLMIUM LASER APPLICATION;  Surgeon: Hollice Espy, MD;  Location: [facility];  Service: Urology;  Laterality: Left;   JOINT REPLACEMENT     KNEE ARTHROPLASTY Right 11/03/2015   Procedure: COMPUTER ASSISTED TOTAL KNEE ARTHROPLASTY;  Surgeon: Dereck Leep, MD;  Location: [facility];  Service: Orthopedics;  Laterality: Right;   MASTECTOMY Left 1986   positive   TOTAL KNEE ARTHROPLASTY Left 03/23/2021   Procedure: TOTAL KNEE ARTHROPLASTY;  Surgeon: Lovell Sheehan, MD;  Location: [facility];  Service: Orthopedics;  Laterality: Left;   URETERAL BIOPSY Left 10/14/2018   Procedure: URETERAL BIOPSY;  Surgeon: Hollice Espy, MD;  Location: [facility];  Service: Urology;  Laterality: Left;   URETERAL BIOPSY Left 02/24/2019   Procedure: URETERAL BIOPSY;  Surgeon: Hollice Espy, MD;  Location: [facility];  Service: Urology;  Laterality: Left;    Home Medications:  Allergies as of 12/30/2021       Reactions   Penicillin V Potassium Hives   Has patient had a PCN reaction causing immediate rash, facial/tongue/throat swelling, SOB or lightheadedness with hypotension: no Has patient had a PCN reaction causing severe rash involving mucus membranes or skin necrosis: {no Has patient had a PCN reaction that required hospitalization no Has patient had a PCN reaction occurring within the last 10 years: no If all of the above answers are "NO", then may proceed with Cephalosporin use.   Sulfa Antibiotics Hives, Itching        Medication List        Accurate as of December 30, 2021  3:29 PM. If you  have any questions, ask your nurse or doctor.          STOP taking these medications    loperamide 2 MG capsule Commonly known as: IMODIUM Stopped by: Hollice Espy, MD       TAKE these medications    acetaminophen 500 MG tablet Commonly known as: TYLENOL Take 1,000 mg by mouth every 6 (six) hours as needed for moderate pain (pain.).   aspirin 81 MG chewable tablet Chew 1 tablet (81 mg total) by mouth 2 (two) times daily.   atorvastatin 40 MG tablet Commonly known as: LIPITOR Take 1 tablet by mouth once daily   AZO DINE PO Take 100 mg by mouth as needed (dysuria).   CALCIUM+D3 PO Take 1 tablet by mouth daily.   cholecalciferol 25 MCG (1000 UNIT) tablet Commonly known as: VITAMIN D3 Take 1,000 Units by mouth at bedtime.   donepezil 5 MG tablet Commonly known as: ARICEPT Take 5 mg by mouth every morning.   hydrocortisone 2.5 % ointment Apply topically as needed (rash; hemorrhoids).   ketoconazole 2 % shampoo Commonly known as: NIZORAL  Apply 1 application topically as needed (flaky/itchy scalp). Up to once per week   levETIRAcetam 500 MG tablet Commonly known as: KEPPRA Take 500 mg by mouth 2 (two) times daily.   metFORMIN 500 MG tablet Commonly known as: GLUCOPHAGE Take 1 tablet by mouth once daily with breakfast   multivitamin with minerals Tabs tablet Take 1 tablet by mouth daily at 2 PM.   nitrofurantoin (macrocrystal-monohydrate) 100 MG capsule Commonly known as: MACROBID Take 1 capsule (100 mg total) by mouth every 12 (twelve) hours. Take 1 capsule (100 mg total) by mouth every 12 (twelve) hours. Start this medication 3 days before your scheduled cystoscopy   nitrofurantoin (macrocrystal-monohydrate) 100 MG capsule Commonly known as: Macrobid Take 1 capsule (100 mg total) by mouth 2 (two) times daily.   nystatin cream Commonly known as: MYCOSTATIN Apply 1 application topically daily as needed for dry skin (fungal infection).   psyllium 28 %  packet Commonly known as: METAMUCIL SMOOTH TEXTURE Take 1 packet by mouth 2 (two) times daily. 2 -3 tablespoons a day   simethicone 80 MG chewable tablet Commonly known as: MYLICON Chew 80 mg by mouth every 6 (six) hours as needed for flatulence.   simethicone 125 MG chewable tablet Commonly known as: MYLICON Chew 283 mg by mouth every 6 (six) hours as needed for flatulence (take 2 tablets).   traMADol 50 MG tablet Commonly known as: ULTRAM        Allergies:  Allergies  Allergen Reactions   Penicillin V Potassium Hives    Has patient had a PCN reaction causing immediate rash, facial/tongue/throat swelling, SOB or lightheadedness with hypotension: no Has patient had a PCN reaction causing severe rash involving mucus membranes or skin necrosis: {no Has patient had a PCN reaction that required hospitalization no Has patient had a PCN reaction occurring within the last 10 years: no If all of the above answers are "NO", then may proceed with Cephalosporin use.   Sulfa Antibiotics Hives and Itching    Family History: Family History  Problem Relation Age of Onset   Glaucoma Mother    Kidney disease Mother    Arthritis Mother    Stroke Mother    Breast cancer Mother 77   Bladder Cancer Mother 28   Heart disease Father    Cancer Brother 77       lung cancer, smoker   Breast cancer Cousin        3 mat cousins dx late 47s   Breast cancer Cousin        1 mat cousin dx 68s-60s    Social History:  reports that she has never smoked. She has never used smokeless tobacco. She reports that she does not drink alcohol and does not use drugs.   Physical Exam: Constitutional:  Alert and oriented, No acute distress. HEENT: [place] AT, moist mucus membranes.  Trachea midline, no masses. Cardiovascular: No clubbing, cyanosis, or edema. Respiratory: Normal respiratory effort, no increased work of breathing. Skin: No rashes, bruises or suspicious lesions. Neurologic: Grossly intact, no  focal deficits, moving all 4 extremities. Psychiatric: Normal mood and affect.  Laboratory Data: Lab Results  Component Value Date   WBC 8.0 12/09/2021   HGB 13.3 12/09/2021   HCT 39.3 12/09/2021   MCV 90.3 12/09/2021   PLT 271 12/09/2021    Lab Results  Component Value Date   CREATININE 0.63 12/09/2021   Lab Results  Component Value Date   HGBA1C 5.9 (H) 12/09/2021    Urinalysis  Results for orders placed or performed during the hospital encounter of 12/30/21  Urinalysis, Complete w Microscopic  Result Value Ref Range   Color, Urine YELLOW YELLOW   APPearance HAZY (A) CLEAR   Specific Gravity, Urine 1.020 1.005 - 1.030   pH 5.0 5.0 - 8.0   Glucose, UA NEGATIVE NEGATIVE mg/dL   Hgb urine dipstick MODERATE (A) NEGATIVE   Bilirubin Urine NEGATIVE NEGATIVE   Ketones, ur NEGATIVE NEGATIVE mg/dL   Protein, ur NEGATIVE NEGATIVE mg/dL   Nitrite NEGATIVE NEGATIVE   Leukocytes,Ua LARGE (A) NEGATIVE   Squamous Epithelial / LPF 11-20 0 - 5   WBC, UA >50 0 - 5 WBC/hpf   RBC / HPF 21-50 0 - 5 RBC/hpf   Bacteria, UA MANY (A) NONE SEEN      Pertinent Imaging: CT HEMATURIA WORKUP  Narrative CLINICAL DATA:  History of urothelial carcinoma of distal left ureter, surgically removed in 2019. History of breast cancer. * Tracking Code: BO *  EXAM: CT ABDOMEN AND PELVIS WITHOUT AND WITH CONTRAST  TECHNIQUE: Multidetector CT imaging of the abdomen and pelvis was performed following the standard protocol before and following the bolus administration of intravenous contrast.  RADIATION DOSE REDUCTION: This exam was performed according to the departmental dose-optimization program which includes automated exposure control, adjustment of the mA and/or kV according to patient size and/or use of iterative reconstruction technique.  CONTRAST:  162m OMNIPAQUE IOHEXOL 300 MG/ML  SOLN  COMPARISON:  06/02/2021  FINDINGS: Lower chest: Clear lung bases. Normal heart size without  pericardial or pleural effusion.  Hepatobiliary: Normal liver. Normal gallbladder, without biliary ductal dilatation.  Pancreas: Normal, without mass or ductal dilatation.  Spleen: Normal in size, without focal abnormality.  Adrenals/Urinary Tract: Normal adrenal glands. No renal calculi. No renal mass on post-contrast imaging. Left renal cortical thinning superiorly and laterally.  Good renal collecting system opacification on delayed images. Development of moderate left-sided hydroureteronephrosis. This is followed to the level of the distal left ureter, where there is a relatively gradual transition to normal caliber. There is equivocal punctate calcification in this region on precontrast coronal image 67. Suggestion of subtle hyperenhancement on postcontrast coronal image 64 and transverse image 65/4.  No bladder filling defect or enhancing mass.  Stomach/Bowel: Proximal gastric underdistention. Scattered colonic diverticula. Equivocal soft tissue fullness within the ascending colon just lateral and cephalad to the ileocecal junction on 46/4, favored to be due to underdistention and without correlate on 06/02/2021. Normal terminal ileum. Normal small bowel.  Vascular/Lymphatic: Aortic atherosclerosis. No abdominal adenopathy. A right external iliac node of 10 mm on 70/4 measured 9 mm on the prior.  Reproductive: Hysterectomy.  No adnexal mass.  Other: No significant free fluid. Moderate pelvic floor laxity. Tiny left larger than right fat containing inguinal hernias.  Musculoskeletal: Left mastectomy.  Lumbosacral spondylosis.  IMPRESSION: 1. Development of moderate left-sided hydroureteronephrosis, followed to the level of the distal left ureter. Subtle hyperenhancement in this area is suspicious for recurrent urothelial carcinoma. Cannot exclude a punctate stone in this region which could less likely be the cause of the enhancement. 2. No specific evidence of  metastatic disease. A right external iliac node is upper normal sized and minimally enlarged compared to the prior exam. This can be re-evaluated on follow-up. 3. Apparent soft tissue fullness in the ascending colon, favored to be due to underdistention. Correlate with colon cancer screening history. 4.  Aortic Atherosclerosis (ICD10-I70.0). 5. Pelvic floor laxity.   Electronically Signed By: KAbigail Miyamoto  M.D. On: 12/21/2021 08:34    Assessment & Plan:    1. CIS (carcinoma in situ of bladder) Personal history of CIS of the bladder as well as left distal ureteral urothelial malignancy managed as above  Distinct change on CT urogram which was compared personally today and with her previous in the room on the computer side-by-side comparison.  There is interval development of new left hydronephrosis as well as a calcification left distal ureter which may represent recurrent malignancy versus stricture with a stone.  I recommended proceeding to the operating room for cystoscopy, left retrograde, left diagnostic ureteroscopy with possible laser lithotripsy and/or biopsy and ablation of the tumor.  We will also plan on placing a left ureteral stent.  We discussed risk of bleeding, infection, damage surrounding structures, need for staged procedure amongst others.  Follow-up will plan will be based on intraoperative findings.  Given her history of chronic bacterial colonization, will likely pretreat her in anticipation of the procedure likely due on cystoscopy with 3 days of Macrobid prior to this.  Preoperative urine culture was sent today.  All questions were answered. - Urine Culture; Future   2. Urothelial carcinoma of left distal ureter ([place]) As above  3. Recurrent UTI As above  4. Colonic thickening Incidental finding, previously was seeing Dr. Vicente Males who had recommended consideration of colonoscopy.  Given the CT scan finding, recommend she continue this discussion with him and  consider this    Hollice Espy, MD  [facility] [address], [place], [place] [address] [phone]  I spent 50 total minutes on the day of the encounter including pre-visit review of the medical record, face-to-face time with the patient, and post visit ordering of labs/imaging/tests.

## 2021-12-30 NOTE — Progress Notes (Signed)
Surgical Physician Order Form [facility]  * Scheduling expectation : Next Available  *Length of Case:   *Clearance needed: no  *Anticoagulation Instructions: N/A  *Aspirin Instructions: {Blank multiple:19197::"Hold Aspirin","Hold Aspirin and Plavix","Hold Plavix ok to continue ASA","Ok to continue Aspirin","Ok to continue all","N/A"}  *Post-op visit Date/Instructions:   TBD  *Diagnosis:  h/o left ureteral cancer, left hydronephrosis  *Procedure: left  ureteroscopy, possible ureteral biopsy/ tumor ablation vs. Laser lithotripsy, left ureteral stent placement, left retrograde pyelogram   Additional orders: N/A  -Admit type: OUTpatient  -Anesthesia: General  -VTE Prophylaxis Standing Order SCD's       Other:   -Standing Lab Orders Per Anesthesia    Lab other: None  -Standing Test orders EKG/Chest x-ray per Anesthesia       Test other:   - Medications:  Ancef 2gm IV  -Other orders:  Take macrobid x 2 bid starting 3 days before surgery

## 2021-12-30 NOTE — Progress Notes (Signed)
12/30/2021 3:29 PM   Elizabeth Trujillo 1935-04-26 [id number]  Referring provider: [name]., MD No address on file  No chief complaint on file.   HPI: 86 y.o.female with a personal history rUTIs,CIS, urothelial carcinoma of left distal ureter, and high risk hematuria, who presents today for surveillance cystoscopy.      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 She underwent ablative therapy and BCG in 2019. She returned to the operating room on 12/20 for surveillance including a cystoscopy and ureteroscopy w/ NED at that time. Personally discussed w/ radiology regarding her distal ureter malignancy.    She went to the operating room on 11/2019 for surveillance ureteroscopy.  Bilateral  retrograde indicated some slight narrowing of the left distal ureter with prompt drainage, ureteroscopy was unremarkable   CTU on 12/21/2021 showed interval development of moderate left-sided hydroureteronephrosis down to the left distal ureter.  There is also subtle hyperenhancement which the radiologist felt could be consistent with recurrence.  There is also questionable punctate stone at the same location which is also within the differential diagnosis.  There is also some fullness appreciated in the colon felt to likely be related to underdistention rather than mass.  She had seen GI last year for diarrhea.  Her last colonoscopy was over 13 years ago.  She is accompanied today by her daughter and husband.    PMH: Past Medical History:  Diagnosis Date   Anemia    Arthritis    Breast cancer ([place]) 1986   left mastectomy   carcinoma in situ of urinary bladder 2019   BCG tx per pt   Collagen vascular disease ([place])    RA    Diabetes mellitus without complication (HCC)    Elevated triglycerides with high cholesterol    Family history of bladder cancer    Family history of breast cancer    Family  history of lung cancer    GERD (gastroesophageal reflux disease)    history of reflux   History of cancer of ureter    Hypercholesterolemia    Hypertension    history of    Primary ureteral papillary carcinoma, left ([place]) 2019   Surgical resection and BCG  tx per pt   Seizure ([place])    last 08-2018   Stroke [facility])    TIA    Surgical History: Past Surgical History:  Procedure Laterality Date   ABDOMINAL HYSTERECTOMY  09/1984   TAH/BSO   BIOPSY Left 11/11/2018   Procedure: BIOPSY;  Surgeon: Hollice Espy, MD;  Location: [facility] ORS;  Service: Urology;  Laterality: Left;   BREAST BIOPSY Right 01/23/2013   [place] bx/clip-neg   BREAST SURGERY Left 07/1984   Mastectomy   callus removed from left toe     CATARACT EXTRACTION W/ INTRAOCULAR LENS IMPLANT Left 2015   COLONOSCOPY     CYSTOSCOPY W/ RETROGRADES Left 02/24/2019   Procedure: CYSTOSCOPY WITH RETROGRADE PYELOGRAM;  Surgeon: Hollice Espy, MD;  Location: [facility];  Service: Urology;  Laterality: Left;   CYSTOSCOPY WITH BIOPSY N/A 02/24/2019   Procedure: CYSTOSCOPY WITH Bladder BIOPSY;  Surgeon: Hollice Espy, MD;  Location: [facility];  Service: Urology;  Laterality: N/A;   CYSTOSCOPY WITH FULGERATION N/A 02/24/2019   Procedure: CYSTOSCOPY WITH FULGERATION;  Surgeon: Hollice Espy, MD;  Location: [facility];  Service: Urology;  Laterality: N/A;   CYSTOSCOPY WITH RETROGRADE PYELOGRAM, URETEROSCOPY AND STENT PLACEMENT Left 11/24/2019   Procedure:  CYSTOSCOPY WITH RETROGRADE PYELOGRAM, URETEROSCOPY;  Surgeon: Hollice Espy, MD;  Location: [facility];  Service: Urology;  Laterality: Left;   CYSTOSCOPY WITH URETEROSCOPY AND STENT PLACEMENT Left 10/14/2018   Procedure: CYSTOSCOPY WITH URETEROSCOPY AND STENT PLACEMENT;  Surgeon: Hollice Espy, MD;  Location: [facility];  Service: Urology;  Laterality: Left;   CYSTOSCOPY WITH URETEROSCOPY AND STENT PLACEMENT Left 02/24/2019   Procedure: CYSTOSCOPY WITH URETEROSCOPY AND STENT Exchange;   Surgeon: Hollice Espy, MD;  Location: [facility];  Service: Urology;  Laterality: Left;   CYSTOSCOPY/URETEROSCOPY/HOLMIUM LASER/STENT PLACEMENT Left 11/11/2018   Procedure: CYSTOSCOPY/URETEROSCOPY//STENT EXCHANGE;  Surgeon: Hollice Espy, MD;  Location: [facility];  Service: Urology;  Laterality: Left;   EYE SURGERY Left 2013   FOOT SURGERY Left 04/2013   HOLMIUM LASER APPLICATION Left 03/21/3233   Procedure: HOLMIUM LASER APPLICATION;  Surgeon: Hollice Espy, MD;  Location: [facility];  Service: Urology;  Laterality: Left;   JOINT REPLACEMENT     KNEE ARTHROPLASTY Right 11/03/2015   Procedure: COMPUTER ASSISTED TOTAL KNEE ARTHROPLASTY;  Surgeon: Dereck Leep, MD;  Location: [facility];  Service: Orthopedics;  Laterality: Right;   MASTECTOMY Left 1986   positive   TOTAL KNEE ARTHROPLASTY Left 03/23/2021   Procedure: TOTAL KNEE ARTHROPLASTY;  Surgeon: Lovell Sheehan, MD;  Location: [facility];  Service: Orthopedics;  Laterality: Left;   URETERAL BIOPSY Left 10/14/2018   Procedure: URETERAL BIOPSY;  Surgeon: Hollice Espy, MD;  Location: [facility];  Service: Urology;  Laterality: Left;   URETERAL BIOPSY Left 02/24/2019   Procedure: URETERAL BIOPSY;  Surgeon: Hollice Espy, MD;  Location: [facility];  Service: Urology;  Laterality: Left;    Home Medications:  Allergies as of 12/30/2021       Reactions   Penicillin V Potassium Hives   Has patient had a PCN reaction causing immediate rash, facial/tongue/throat swelling, SOB or lightheadedness with hypotension: no Has patient had a PCN reaction causing severe rash involving mucus membranes or skin necrosis: {no Has patient had a PCN reaction that required hospitalization no Has patient had a PCN reaction occurring within the last 10 years: no If all of the above answers are "NO", then may proceed with Cephalosporin use.   Sulfa Antibiotics Hives, Itching        Medication List        Accurate as of December 30, 2021  3:29 PM. If you  have any questions, ask your nurse or doctor.          STOP taking these medications    loperamide 2 MG capsule Commonly known as: IMODIUM Stopped by: Hollice Espy, MD       TAKE these medications    acetaminophen 500 MG tablet Commonly known as: TYLENOL Take 1,000 mg by mouth every 6 (six) hours as needed for moderate pain (pain.).   aspirin 81 MG chewable tablet Chew 1 tablet (81 mg total) by mouth 2 (two) times daily.   atorvastatin 40 MG tablet Commonly known as: LIPITOR Take 1 tablet by mouth once daily   AZO DINE PO Take 100 mg by mouth as needed (dysuria).   CALCIUM+D3 PO Take 1 tablet by mouth daily.   cholecalciferol 25 MCG (1000 UNIT) tablet Commonly known as: VITAMIN D3 Take 1,000 Units by mouth at bedtime.   donepezil 5 MG tablet Commonly known as: ARICEPT Take 5 mg by mouth every morning.   hydrocortisone 2.5 % ointment Apply topically as needed (rash; hemorrhoids).   ketoconazole 2 % shampoo Commonly known as: NIZORAL  Apply 1 application topically as needed (flaky/itchy scalp). Up to once per week   levETIRAcetam 500 MG tablet Commonly known as: KEPPRA Take 500 mg by mouth 2 (two) times daily.   metFORMIN 500 MG tablet Commonly known as: GLUCOPHAGE Take 1 tablet by mouth once daily with breakfast   multivitamin with minerals Tabs tablet Take 1 tablet by mouth daily at 2 PM.   nitrofurantoin (macrocrystal-monohydrate) 100 MG capsule Commonly known as: MACROBID Take 1 capsule (100 mg total) by mouth every 12 (twelve) hours. Take 1 capsule (100 mg total) by mouth every 12 (twelve) hours. Start this medication 3 days before your scheduled cystoscopy   nitrofurantoin (macrocrystal-monohydrate) 100 MG capsule Commonly known as: Macrobid Take 1 capsule (100 mg total) by mouth 2 (two) times daily.   nystatin cream Commonly known as: MYCOSTATIN Apply 1 application topically daily as needed for dry skin (fungal infection).   psyllium 28 %  packet Commonly known as: METAMUCIL SMOOTH TEXTURE Take 1 packet by mouth 2 (two) times daily. 2 -3 tablespoons a day   simethicone 80 MG chewable tablet Commonly known as: MYLICON Chew 80 mg by mouth every 6 (six) hours as needed for flatulence.   simethicone 125 MG chewable tablet Commonly known as: MYLICON Chew 409 mg by mouth every 6 (six) hours as needed for flatulence (take 2 tablets).   traMADol 50 MG tablet Commonly known as: ULTRAM        Allergies:  Allergies  Allergen Reactions   Penicillin V Potassium Hives    Has patient had a PCN reaction causing immediate rash, facial/tongue/throat swelling, SOB or lightheadedness with hypotension: no Has patient had a PCN reaction causing severe rash involving mucus membranes or skin necrosis: {no Has patient had a PCN reaction that required hospitalization no Has patient had a PCN reaction occurring within the last 10 years: no If all of the above answers are "NO", then may proceed with Cephalosporin use.   Sulfa Antibiotics Hives and Itching    Family History: Family History  Problem Relation Age of Onset   Glaucoma Mother    Kidney disease Mother    Arthritis Mother    Stroke Mother    Breast cancer Mother 50   Bladder Cancer Mother 81   Heart disease Father    Cancer Brother 32       lung cancer, smoker   Breast cancer Cousin        3 mat cousins dx late 12s   Breast cancer Cousin        1 mat cousin dx 17s-60s    Social History:  reports that she has never smoked. She has never used smokeless tobacco. She reports that she does not drink alcohol and does not use drugs.   Physical Exam: Constitutional:  Alert and oriented, No acute distress. HEENT: [place] AT, moist mucus membranes.  Trachea midline, no masses. Cardiovascular: No clubbing, cyanosis, or edema. Respiratory: Normal respiratory effort, no increased work of breathing. Skin: No rashes, bruises or suspicious lesions. Neurologic: Grossly intact, no  focal deficits, moving all 4 extremities. Psychiatric: Normal mood and affect.  Laboratory Data: Lab Results  Component Value Date   WBC 8.0 12/09/2021   HGB 13.3 12/09/2021   HCT 39.3 12/09/2021   MCV 90.3 12/09/2021   PLT 271 12/09/2021    Lab Results  Component Value Date   CREATININE 0.63 12/09/2021   Lab Results  Component Value Date   HGBA1C 5.9 (H) 12/09/2021    Urinalysis  Results for orders placed or performed during the hospital encounter of 12/30/21  Urinalysis, Complete w Microscopic  Result Value Ref Range   Color, Urine YELLOW YELLOW   APPearance HAZY (A) CLEAR   Specific Gravity, Urine 1.020 1.005 - 1.030   pH 5.0 5.0 - 8.0   Glucose, UA NEGATIVE NEGATIVE mg/dL   Hgb urine dipstick MODERATE (A) NEGATIVE   Bilirubin Urine NEGATIVE NEGATIVE   Ketones, ur NEGATIVE NEGATIVE mg/dL   Protein, ur NEGATIVE NEGATIVE mg/dL   Nitrite NEGATIVE NEGATIVE   Leukocytes,Ua LARGE (A) NEGATIVE   Squamous Epithelial / LPF 11-20 0 - 5   WBC, UA >50 0 - 5 WBC/hpf   RBC / HPF 21-50 0 - 5 RBC/hpf   Bacteria, UA MANY (A) NONE SEEN      Pertinent Imaging: CT HEMATURIA WORKUP  Narrative CLINICAL DATA:  History of urothelial carcinoma of distal left ureter, surgically removed in 2019. History of breast cancer. * Tracking Code: BO *  EXAM: CT ABDOMEN AND PELVIS WITHOUT AND WITH CONTRAST  TECHNIQUE: Multidetector CT imaging of the abdomen and pelvis was performed following the standard protocol before and following the bolus administration of intravenous contrast.  RADIATION DOSE REDUCTION: This exam was performed according to the departmental dose-optimization program which includes automated exposure control, adjustment of the mA and/or kV according to patient size and/or use of iterative reconstruction technique.  CONTRAST:  131m OMNIPAQUE IOHEXOL 300 MG/ML  SOLN  COMPARISON:  06/02/2021  FINDINGS: Lower chest: Clear lung bases. Normal heart size without  pericardial or pleural effusion.  Hepatobiliary: Normal liver. Normal gallbladder, without biliary ductal dilatation.  Pancreas: Normal, without mass or ductal dilatation.  Spleen: Normal in size, without focal abnormality.  Adrenals/Urinary Tract: Normal adrenal glands. No renal calculi. No renal mass on post-contrast imaging. Left renal cortical thinning superiorly and laterally.  Good renal collecting system opacification on delayed images. Development of moderate left-sided hydroureteronephrosis. This is followed to the level of the distal left ureter, where there is a relatively gradual transition to normal caliber. There is equivocal punctate calcification in this region on precontrast coronal image 67. Suggestion of subtle hyperenhancement on postcontrast coronal image 64 and transverse image 65/4.  No bladder filling defect or enhancing mass.  Stomach/Bowel: Proximal gastric underdistention. Scattered colonic diverticula. Equivocal soft tissue fullness within the ascending colon just lateral and cephalad to the ileocecal junction on 46/4, favored to be due to underdistention and without correlate on 06/02/2021. Normal terminal ileum. Normal small bowel.  Vascular/Lymphatic: Aortic atherosclerosis. No abdominal adenopathy. A right external iliac node of 10 mm on 70/4 measured 9 mm on the prior.  Reproductive: Hysterectomy.  No adnexal mass.  Other: No significant free fluid. Moderate pelvic floor laxity. Tiny left larger than right fat containing inguinal hernias.  Musculoskeletal: Left mastectomy.  Lumbosacral spondylosis.  IMPRESSION: 1. Development of moderate left-sided hydroureteronephrosis, followed to the level of the distal left ureter. Subtle hyperenhancement in this area is suspicious for recurrent urothelial carcinoma. Cannot exclude a punctate stone in this region which could less likely be the cause of the enhancement. 2. No specific evidence of  metastatic disease. A right external iliac node is upper normal sized and minimally enlarged compared to the prior exam. This can be re-evaluated on follow-up. 3. Apparent soft tissue fullness in the ascending colon, favored to be due to underdistention. Correlate with colon cancer screening history. 4.  Aortic Atherosclerosis (ICD10-I70.0). 5. Pelvic floor laxity.   Electronically Signed By: KAbigail Miyamoto  M.D. On: 12/21/2021 08:34    Assessment & Plan:    1. CIS (carcinoma in situ of bladder) Personal history of CIS of the bladder as well as left distal ureteral urothelial malignancy managed as above  Distinct change on CT urogram which was compared personally today and with her previous in the room on the computer side-by-side comparison.  There is interval development of new left hydronephrosis as well as a calcification left distal ureter which may represent recurrent malignancy versus stricture with a stone.  I recommended proceeding to the operating room for cystoscopy, left retrograde, left diagnostic ureteroscopy with possible laser lithotripsy and/or biopsy and ablation of the tumor.  We will also plan on placing a left ureteral stent.  We discussed risk of bleeding, infection, damage surrounding structures, need for staged procedure amongst others.  Follow-up will plan will be based on intraoperative findings.  Given her history of chronic bacterial colonization, will likely pretreat her in anticipation of the procedure likely due on cystoscopy with 3 days of Macrobid prior to this.  Preoperative urine culture was sent today.  All questions were answered. - Urine Culture; Future   2. Urothelial carcinoma of left distal ureter ([place]) As above  3. Recurrent UTI As above  4. Colonic thickening Incidental finding, previously was seeing Dr. Vicente Males who had recommended consideration of colonoscopy.  Given the CT scan finding, recommend she continue this discussion with him and  consider this    Hollice Espy, MD  [facility] [address], [place], [place] [address] [phone]  I spent 50 total minutes on the day of the encounter including pre-visit review of the medical record, face-to-face time with the patient, and post visit ordering of labs/imaging/tests.

## 2022-01-01 LAB — URINE CULTURE

## 2022-01-02 DIAGNOSIS — Z23 Encounter for immunization: Secondary | ICD-10-CM | POA: Diagnosis not present

## 2022-01-04 ENCOUNTER — Telehealth: Payer: Self-pay

## 2022-01-04 MED ORDER — NITROFURANTOIN MONOHYD MACRO 100 MG PO CAPS: 100.0000 mg | ORAL_CAPSULE | Freq: Two times a day (BID) | ORAL | 0 refills | Status: DC

## 2022-01-04 NOTE — Progress Notes (Signed)
[facility] Posting Form   Surgery Date/Time: Date: 01/09/2022  Surgeon: Dr. Hollice Espy, MD  Surgery Location: Day Surgery  Inpt ( No  )   Outpt (Yes)   Obs ( No  )   Diagnosis: N13.30 Left Hydronephrosis, C66.2 Left Urothelial Carcinoma of Ureter  -CPT: 63785, 88502, 77412, 87867, 67209  Surgery: Left Ureteroscopy with possible ureteral biopsy/tumor ablation vs. Laser Lithotripsy, left Ureteral Stent Placement and Left Retrograde Pyelogram  Stop Anticoagulations: No, may continue '81mg'$  ASA  Cardiac/Medical/Pulmonary Clearance needed: no  *Orders entered into EPIC  Date: 01/04/22   *Case booked in [place]  Date: 01/03/2022  *Notified pt of Surgery: Date: 01/03/2022  PRE-OP UA & CX: no  *Placed into Prior Authorization Work Que Date: 01/04/22   Assistant/laser/rep:No

## 2022-01-04 NOTE — Telephone Encounter (Signed)
I spoke with Mrs. Cavan. We have discussed possible surgery dates and Monday October 30th, 2023 was agreed upon by all parties. Patient given information about surgery date, what to expect pre-operatively and post operatively.  We discussed that a Pre-Admission Testing office will be calling to set up the pre-op visit that will take place prior to surgery, and that these appointments are typically done over the phone with a Pre-Admissions RN.  Informed patient that our office will communicate any additional care to be provided after surgery. Patients questions or concerns were discussed during our call. Advised to call our office should there be any additional information, questions or concerns that arise. Patient verbalized understanding.

## 2022-01-06 ENCOUNTER — Encounter
Admission: RE | Admit: 2022-01-06 | Discharge: 2022-01-06 | Disposition: A | Discharge status: Home / Self Care | Payer: Medicare Other | Source: Ambulatory Visit | Attending: Urology | Admitting: Urology

## 2022-01-06 VITALS — Ht 62.0 in | Wt 203.9 lb

## 2022-01-06 DIAGNOSIS — Z01818 Encounter for other preprocedural examination: Secondary | ICD-10-CM

## 2022-01-06 DIAGNOSIS — Z23 Encounter for immunization: Secondary | ICD-10-CM | POA: Diagnosis not present

## 2022-01-06 DIAGNOSIS — I10 Essential (primary) hypertension: Secondary | ICD-10-CM

## 2022-01-06 HISTORY — DX: Malignant neoplasm of urinary organ, unspecified: C68.9

## 2022-01-06 HISTORY — DX: Urinary tract infection, site not specified: N39.0

## 2022-01-06 NOTE — Patient Instructions (Addendum)
Your procedure is scheduled on:01-09-22 Monday Report to the Registration Desk on the 1st floor of the [facility].Then proceed to the 2nd floor Surgery Desk To find out your arrival time, please call [phone] between 1PM - 3PM on:01-06-22 Friday If your arrival time is 6:00 am, do not arrive prior to that time as the [facility] entrance doors do not open until 6:00 am.  REMEMBER: Instructions that are not followed completely may result in serious medical risk, up to and including death; or upon the discretion of your surgeon and anesthesiologist your surgery may need to be rescheduled.  Do not eat food Or drink any liquids after midnight the night before surgery.  No gum chewing, lozengers or hard candies.  TAKE THESE MEDICATIONS THE MORNING OF SURGERY WITH A SIP OF WATER: -levETIRAcetam (KEPPRA)  -donepezil (ARICEPT)   Continue your 81 mg up until the day prior to surgery-Do NOT take the morning of surgery  Stop your metFORMIN (GLUCOPHAGE) 2 days prior to surgery-Last dose will be today 01-06-22 Friday  One week prior to surgery: Stop Anti-inflammatories (NSAIDS) such as Advil, Aleve, Ibuprofen, Motrin, Naproxen, Naprosyn and Aspirin based products such as Excedrin, Goodys Powder, BC Powder.You may however, continue to take Tylenol if needed for pain up until the day of surgery.  Stop ANY OVER THE COUNTER supplements/vitamins NOW (01-06-22) until after surgery (Calcium, Vitamin D, Multivitamin)  No Alcohol for 24 hours before or after surgery.  No Smoking including e-cigarettes for 24 hours prior to surgery.  No chewable tobacco products for at least 6 hours prior to surgery.  No nicotine patches on the day of surgery.  Do not use any "recreational" drugs for at least a week prior to your surgery.  Please be advised that the combination of cocaine and anesthesia may have negative outcomes, up to and including death. If you test positive for cocaine, your surgery will be  cancelled.  On the morning of surgery brush your teeth with toothpaste and water, you may rinse your mouth with mouthwash if you wish. Do not swallow any toothpaste or mouthwash.  Do not wear jewelry, make-up, hairpins, clips or nail polish.  Do not wear lotions, powders, or perfumes.   Do not shave body from the neck down 48 hours prior to surgery just in case you cut yourself which could leave a site for infection.  Also, freshly shaved skin may become irritated if using the CHG soap.  Contact lenses, hearing aids and dentures may not be worn into surgery.  Do not bring valuables to the hospital. [facility] is not responsible for any missing/lost belongings or valuables.   Notify your doctor if there is any change in your medical condition (cold, fever, infection).  Wear comfortable clothing (specific to your surgery type) to the hospital.  After surgery, you can help prevent lung complications by doing breathing exercises.  Take deep breaths and cough every 1-2 hours. Your doctor may order a device called an Incentive Spirometer to help you take deep breaths. When coughing or sneezing, hold a pillow firmly against your incision with both hands. This is called "splinting." Doing this helps protect your incision. It also decreases belly discomfort.  If you are being admitted to the hospital overnight, leave your suitcase in the car. After surgery it may be brought to your room.  If you are being discharged the day of surgery, you will not be allowed to drive home. You will need a responsible adult (18 years or  older) to drive you home and stay with you that night.   If you are taking public transportation, you will need to have a responsible adult (18 years or older) with you. Please confirm with your physician that it is acceptable to use public transportation.   Please call the [facility]. at [phone] if you have any questions about these  instructions.  Surgery Visitation Policy:  Patients undergoing a surgery or procedure may have two family members or support persons with them as long as the person is not COVID-19 positive or experiencing its symptoms.

## 2022-01-09 ENCOUNTER — Other Ambulatory Visit: Payer: Self-pay

## 2022-01-09 ENCOUNTER — Ambulatory Visit: Payer: Medicare Other

## 2022-01-09 ENCOUNTER — Encounter: Payer: Self-pay | Admitting: Urology

## 2022-01-09 ENCOUNTER — Ambulatory Visit
Admission: RE | Admit: 2022-01-09 | Discharge: 2022-01-09 | Disposition: A | Discharge status: Home / Self Care | Payer: Medicare Other | Attending: Urology | Admitting: Urology

## 2022-01-09 ENCOUNTER — Encounter
Admission: RE | Disposition: A | Discharge status: Home / Self Care | Payer: Self-pay | Source: Home / Self Care | Attending: Urology

## 2022-01-09 DIAGNOSIS — N136 Pyonephrosis: Secondary | ICD-10-CM | POA: Diagnosis not present

## 2022-01-09 DIAGNOSIS — C679 Malignant neoplasm of bladder, unspecified: Secondary | ICD-10-CM | POA: Insufficient documentation

## 2022-01-09 DIAGNOSIS — Z96652 Presence of left artificial knee joint: Secondary | ICD-10-CM | POA: Insufficient documentation

## 2022-01-09 DIAGNOSIS — C662 Malignant neoplasm of left ureter: Secondary | ICD-10-CM | POA: Diagnosis not present

## 2022-01-09 DIAGNOSIS — N210 Calculus in bladder: Secondary | ICD-10-CM | POA: Diagnosis not present

## 2022-01-09 DIAGNOSIS — Z9012 Acquired absence of left breast and nipple: Secondary | ICD-10-CM | POA: Insufficient documentation

## 2022-01-09 DIAGNOSIS — E782 Mixed hyperlipidemia: Secondary | ICD-10-CM | POA: Diagnosis not present

## 2022-01-09 DIAGNOSIS — Z01818 Encounter for other preprocedural examination: Secondary | ICD-10-CM

## 2022-01-09 DIAGNOSIS — N3289 Other specified disorders of bladder: Secondary | ICD-10-CM | POA: Diagnosis not present

## 2022-01-09 DIAGNOSIS — N1330 Unspecified hydronephrosis: Secondary | ICD-10-CM

## 2022-01-09 DIAGNOSIS — K219 Gastro-esophageal reflux disease without esophagitis: Secondary | ICD-10-CM | POA: Diagnosis not present

## 2022-01-09 DIAGNOSIS — M069 Rheumatoid arthritis, unspecified: Secondary | ICD-10-CM | POA: Diagnosis not present

## 2022-01-09 DIAGNOSIS — Z853 Personal history of malignant neoplasm of breast: Secondary | ICD-10-CM | POA: Insufficient documentation

## 2022-01-09 DIAGNOSIS — Z8673 Personal history of transient ischemic attack (TIA), and cerebral infarction without residual deficits: Secondary | ICD-10-CM | POA: Diagnosis not present

## 2022-01-09 DIAGNOSIS — Z8551 Personal history of malignant neoplasm of bladder: Secondary | ICD-10-CM | POA: Diagnosis not present

## 2022-01-09 DIAGNOSIS — Z8554 Personal history of malignant neoplasm of ureter: Secondary | ICD-10-CM | POA: Insufficient documentation

## 2022-01-09 DIAGNOSIS — I10 Essential (primary) hypertension: Secondary | ICD-10-CM | POA: Insufficient documentation

## 2022-01-09 DIAGNOSIS — E119 Type 2 diabetes mellitus without complications: Secondary | ICD-10-CM | POA: Insufficient documentation

## 2022-01-09 HISTORY — PX: URETEROSCOPY: SHX842

## 2022-01-09 HISTORY — PX: CYSTOSCOPY W/ URETERAL STENT PLACEMENT: SHX1429

## 2022-01-09 HISTORY — PX: URETERAL BIOPSY: SHX6688

## 2022-01-09 LAB — GLUCOSE, CAPILLARY
Glucose-Capillary: 115 mg/dL — ABNORMAL HIGH (ref 70–99)
Glucose-Capillary: 111 mg/dL — ABNORMAL HIGH (ref 70–99)

## 2022-01-09 SURGERY — URETEROSCOPY
Anesthesia: General | Laterality: Left

## 2022-01-09 MED ORDER — FENTANYL CITRATE (PF) 100 MCG/2ML IJ SOLN
INTRAMUSCULAR | Status: AC
  Filled 2022-01-09: qty 2

## 2022-01-09 MED ORDER — OXYBUTYNIN CHLORIDE 5 MG PO TABS: 5.0000 mg | ORAL_TABLET | Freq: Three times a day (TID) | ORAL | 0 refills | Status: DC | PRN

## 2022-01-09 MED ORDER — DEXAMETHASONE SODIUM PHOSPHATE 10 MG/ML IJ SOLN
INTRAMUSCULAR | Status: AC
  Filled 2022-01-09: qty 1

## 2022-01-09 MED ORDER — CEFAZOLIN SODIUM-DEXTROSE 2-4 GM/100ML-% IV SOLN
2.0000 g | INTRAVENOUS | Status: AC
  Administered 2022-01-09: 2 g via INTRAVENOUS

## 2022-01-09 MED ORDER — LIDOCAINE HCL (PF) 2 % IJ SOLN
INTRAMUSCULAR | Status: AC
  Filled 2022-01-09: qty 5

## 2022-01-09 MED ORDER — DEXAMETHASONE SODIUM PHOSPHATE 10 MG/ML IJ SOLN
INTRAMUSCULAR | Status: DC | PRN
  Administered 2022-01-09: 10 mg via INTRAVENOUS

## 2022-01-09 MED ORDER — ONDANSETRON HCL 4 MG/2ML IJ SOLN
INTRAMUSCULAR | Status: AC
  Filled 2022-01-09: qty 2

## 2022-01-09 MED ORDER — ROCURONIUM BROMIDE 10 MG/ML (PF) SYRINGE
PREFILLED_SYRINGE | INTRAVENOUS | Status: AC
  Filled 2022-01-09: qty 10

## 2022-01-09 MED ORDER — ONDANSETRON HCL 4 MG/2ML IJ SOLN
INTRAMUSCULAR | Status: DC | PRN
  Administered 2022-01-09: 4 mg via INTRAVENOUS

## 2022-01-09 MED ORDER — CHLORHEXIDINE GLUCONATE 0.12 % MT SOLN
OROMUCOSAL | Status: AC
  Administered 2022-01-09: 15 mL via OROMUCOSAL
  Filled 2022-01-09: qty 15

## 2022-01-09 MED ORDER — MIDAZOLAM HCL 2 MG/2ML IJ SOLN
INTRAMUSCULAR | Status: DC | PRN
  Administered 2022-01-09: 1 mg via INTRAVENOUS

## 2022-01-09 MED ORDER — SODIUM CHLORIDE 0.9 % IV SOLN: INTRAVENOUS | Status: DC

## 2022-01-09 MED ORDER — FENTANYL CITRATE (PF) 100 MCG/2ML IJ SOLN: 25.0000 ug | INTRAMUSCULAR | Status: DC | PRN

## 2022-01-09 MED ORDER — LACTATED RINGERS IV SOLN: INTRAVENOUS | Status: DC | PRN

## 2022-01-09 MED ORDER — CEFAZOLIN SODIUM-DEXTROSE 2-4 GM/100ML-% IV SOLN
INTRAVENOUS | Status: AC
  Filled 2022-01-09: qty 100

## 2022-01-09 MED ORDER — FAMOTIDINE 20 MG PO TABS: 20.0000 mg | ORAL_TABLET | Freq: Once | ORAL | Status: AC

## 2022-01-09 MED ORDER — MIDAZOLAM HCL 2 MG/2ML IJ SOLN
INTRAMUSCULAR | Status: AC
  Filled 2022-01-09: qty 2

## 2022-01-09 MED ORDER — PROPOFOL 10 MG/ML IV BOLUS
INTRAVENOUS | Status: DC | PRN
  Administered 2022-01-09: 150 mg via INTRAVENOUS

## 2022-01-09 MED ORDER — ORAL CARE MOUTH RINSE: 15.0000 mL | Freq: Once | OROMUCOSAL | Status: AC

## 2022-01-09 MED ORDER — LIDOCAINE HCL (CARDIAC) PF 100 MG/5ML IV SOSY
PREFILLED_SYRINGE | INTRAVENOUS | Status: DC | PRN
  Administered 2022-01-09: 100 mg via INTRAVENOUS

## 2022-01-09 MED ORDER — OXYCODONE HCL 5 MG/5ML PO SOLN: 5.0000 mg | Freq: Once | ORAL | Status: DC | PRN

## 2022-01-09 MED ORDER — HYDROCODONE-ACETAMINOPHEN 5-325 MG PO TABS: 1.0000 | ORAL_TABLET | Freq: Four times a day (QID) | ORAL | 0 refills | Status: DC | PRN

## 2022-01-09 MED ORDER — SODIUM CHLORIDE 0.9 % IR SOLN
Status: DC | PRN
  Administered 2022-01-09: 1500 mL

## 2022-01-09 MED ORDER — PHENYLEPHRINE 80 MCG/ML (10ML) SYRINGE FOR IV PUSH (FOR BLOOD PRESSURE SUPPORT)
PREFILLED_SYRINGE | INTRAVENOUS | Status: AC
  Filled 2022-01-09: qty 10

## 2022-01-09 MED ORDER — ROCURONIUM BROMIDE 100 MG/10ML IV SOLN
INTRAVENOUS | Status: DC | PRN
  Administered 2022-01-09: 20 mg via INTRAVENOUS

## 2022-01-09 MED ORDER — IOHEXOL 180 MG/ML  SOLN
INTRAMUSCULAR | Status: DC | PRN
  Administered 2022-01-09: 30 mL

## 2022-01-09 MED ORDER — FENTANYL CITRATE (PF) 100 MCG/2ML IJ SOLN
INTRAMUSCULAR | Status: DC | PRN
  Administered 2022-01-09: 25 ug via INTRAVENOUS

## 2022-01-09 MED ORDER — OXYCODONE HCL 5 MG PO TABS: 5.0000 mg | ORAL_TABLET | Freq: Once | ORAL | Status: DC | PRN

## 2022-01-09 MED ORDER — 0.9 % SODIUM CHLORIDE (POUR BTL) OPTIME
TOPICAL | Status: DC | PRN
  Administered 2022-01-09: 200 mL

## 2022-01-09 MED ORDER — CHLORHEXIDINE GLUCONATE 0.12 % MT SOLN: 15.0000 mL | Freq: Once | OROMUCOSAL | Status: AC

## 2022-01-09 MED ORDER — FAMOTIDINE 20 MG PO TABS
ORAL_TABLET | ORAL | Status: AC
  Administered 2022-01-09: 20 mg via ORAL
  Filled 2022-01-09: qty 1

## 2022-01-09 MED ORDER — SUGAMMADEX SODIUM 200 MG/2ML IV SOLN
INTRAVENOUS | Status: DC | PRN
  Administered 2022-01-09: 200 mg via INTRAVENOUS

## 2022-01-09 MED ORDER — SUCCINYLCHOLINE CHLORIDE 200 MG/10ML IV SOSY
PREFILLED_SYRINGE | INTRAVENOUS | Status: DC | PRN
  Administered 2022-01-09: 100 mg via INTRAVENOUS

## 2022-01-09 MED ORDER — PROPOFOL 10 MG/ML IV BOLUS
INTRAVENOUS | Status: AC
  Filled 2022-01-09: qty 20

## 2022-01-09 MED ORDER — SUCCINYLCHOLINE CHLORIDE 200 MG/10ML IV SOSY
PREFILLED_SYRINGE | INTRAVENOUS | Status: AC
  Filled 2022-01-09: qty 10

## 2022-01-09 MED ORDER — EPHEDRINE SULFATE (PRESSORS) 50 MG/ML IJ SOLN
INTRAMUSCULAR | Status: DC | PRN
  Administered 2022-01-09: 5 mg via INTRAVENOUS

## 2022-01-09 MED ORDER — PHENYLEPHRINE 80 MCG/ML (10ML) SYRINGE FOR IV PUSH (FOR BLOOD PRESSURE SUPPORT)
PREFILLED_SYRINGE | INTRAVENOUS | Status: DC | PRN
  Administered 2022-01-09: 80 ug via INTRAVENOUS

## 2022-01-09 MED ORDER — TAMSULOSIN HCL 0.4 MG PO CAPS: 0.4000 mg | ORAL_CAPSULE | Freq: Every day | ORAL | 0 refills | Status: DC

## 2022-01-09 SURGICAL SUPPLY — 39 items
BAG DRAIN SIEMENS DORNER NS (MISCELLANEOUS) ×1 IMPLANT
BAG DRN NS LF (MISCELLANEOUS) ×1
BAG PRESSURE INF REUSE 3000 (BAG) ×1 IMPLANT
BRUSH SCRUB EZ  4% CHG (MISCELLANEOUS) ×1
BRUSH SCRUB EZ 1% IODOPHOR (MISCELLANEOUS) ×1 IMPLANT
BRUSH SCRUB EZ 4% CHG (MISCELLANEOUS) ×1 IMPLANT
CATH URET FLEX-TIP 2 LUMEN 10F (CATHETERS) ×1 IMPLANT
CATH URETL OPEN 5X70 (CATHETERS) ×1 IMPLANT
CNTNR SPEC 2.5X3XGRAD LEK (MISCELLANEOUS)
CONRAY 43 FOR UROLOGY 50M (MISCELLANEOUS) ×1 IMPLANT
CONT SPEC 4OZ STER OR WHT (MISCELLANEOUS)
CONT SPEC 4OZ STRL OR WHT (MISCELLANEOUS)
CONTAINER SPEC 2.5X3XGRAD LEK (MISCELLANEOUS) ×1 IMPLANT
DRAPE UTILITY 15X26 TOWEL STRL (DRAPES) ×1 IMPLANT
DRSG TELFA 3X4 N-ADH STERILE (GAUZE/BANDAGES/DRESSINGS) ×1 IMPLANT
FORCEPS BIOP PIRANHA Y (CUTTING FORCEPS) IMPLANT
GAUZE 4X4 16PLY ~~LOC~~+RFID DBL (SPONGE) ×2 IMPLANT
GLOVE BIO SURGEON STRL SZ 6.5 (GLOVE) ×1 IMPLANT
GLOVE SURG ENC TEXT LTX SZ8 (GLOVE) ×1 IMPLANT
GOWN STRL REUS W/ TWL LRG LVL3 (GOWN DISPOSABLE) ×2 IMPLANT
GOWN STRL REUS W/ TWL XL LVL3 (GOWN DISPOSABLE) ×1 IMPLANT
GOWN STRL REUS W/TWL LRG LVL3 (GOWN DISPOSABLE) ×2
GOWN STRL REUS W/TWL XL LVL3 (GOWN DISPOSABLE) ×1
GUIDEWIRE GREEN .038 145CM (MISCELLANEOUS) ×1 IMPLANT
GUIDEWIRE STR DUAL SENSOR (WIRE) ×2 IMPLANT
IV NS IRRIG 3000ML ARTHROMATIC (IV SOLUTION) ×1 IMPLANT
KIT TURNOVER CYSTO (KITS) ×1 IMPLANT
MANIFOLD NEPTUNE II (INSTRUMENTS) ×1 IMPLANT
NDL SAFETY ECLIP 18X1.5 (MISCELLANEOUS) ×1 IMPLANT
PACK CYSTO AR (MISCELLANEOUS) ×1 IMPLANT
SET CYSTO W/LG BORE CLAMP LF (SET/KITS/TRAYS/PACK) ×1 IMPLANT
SHEATH NAVIGATOR HD 12/14X36 (SHEATH) ×1 IMPLANT
STENT URET 6FRX24 CONTOUR (STENTS) ×1 IMPLANT
STENT URET 6FRX26 CONTOUR (STENTS) ×1 IMPLANT
SURGILUBE 2OZ TUBE FLIPTOP (MISCELLANEOUS) ×1 IMPLANT
TRAP FLUID SMOKE EVACUATOR (MISCELLANEOUS) ×1 IMPLANT
WATER STERILE IRR 1000ML POUR (IV SOLUTION) ×1 IMPLANT
WATER STERILE IRR 3000ML UROMA (IV SOLUTION) ×1 IMPLANT
WATER STERILE IRR 500ML POUR (IV SOLUTION) ×1 IMPLANT

## 2022-01-09 NOTE — Anesthesia Procedure Notes (Signed)
Procedure Name: Intubation Date/Time: 01/09/2022 2:24 PM  Performed by: Fredderick Phenix, CRNAPre-anesthesia Checklist: Patient identified, Emergency Drugs available, Suction available and Patient being monitored Patient Re-evaluated:Patient Re-evaluated prior to induction Oxygen Delivery Method: Circle system utilized Preoxygenation: Pre-oxygenation with 100% oxygen Induction Type: IV induction Ventilation: Mask ventilation without difficulty Laryngoscope Size: McGraph and 4 Tube type: Oral Tube size: 7.0 mm Number of attempts: 1 Airway Equipment and Method: Stylet and Oral airway Placement Confirmation: ETT inserted through vocal cords under direct vision, positive ETCO2 and breath sounds checked- equal and bilateral Secured at: 22 cm Tube secured with: Tape Dental Injury: Teeth and Oropharynx as per pre-operative assessment

## 2022-01-09 NOTE — Transfer of Care (Signed)
Immediate Anesthesia Transfer of Care Note  [name]  Procedure(s) Performed: URETEROSCOPY (Left) CYSTOSCOPY WITH RETROGRADE PYELOGRAM/URETERAL STENT PLACEMENT (Left) URETERAL BIOPSY/TUMOR ABLATION VS LASER LITHOTRIPSY (Left)  Patient Location: PACU  Anesthesia Type:General  Level of Consciousness: drowsy  Airway & Oxygen Therapy: Patient Spontanous Breathing  Post-op Assessment: Report given to RN  Post vital signs: Reviewed  Last Vitals:  Vitals Value Taken Time  BP 157/78 01/09/22 1500  Temp 36.7 C 01/09/22 1500  Pulse 70 01/09/22 1501  Resp 20 01/09/22 1501  SpO2 98 % 01/09/22 1501  Vitals shown include unvalidated device data.  Last Pain:  Vitals:   01/09/22 1315  TempSrc: Oral         Complications: No notable events documented.

## 2022-01-09 NOTE — Op Note (Signed)
Date of procedure: 01/09/22  Preoperative diagnosis:  History of left ureteral TCC Left hydronephrosis  Postoperative diagnosis:  Same as above  Procedure: Cystoscopy Left retrograde pyelogram Left diagnostic ureteroscopy Left ureteral biopsy Left ureteral stent placement Interpretation fluoroscopy less than 30 minutes  Surgeon: Hollice Espy, MD  Anesthesia: General  Complications: None  Intraoperative findings: Small approximately 1 mm calculus within the bladder.  Left retrograde pyelogram with distinct abnormality in the left distal ureter approximately 2 cm proximal to the UVJ with significant transition point and filling defect.  Direct visualization of this area shows slightly erythematous prominent mucosa without distinct nodularity or papillary change.  I was able to scope beyond this with a normal dilated ureter proximal to this.  Biopsies were taken of this area of fullness.  Stent placed without difficulty.  EBL: Minimal  Specimens: Ureteral biopsy on the left x3  Drains: 6 x 24 French double-J ureteral stent, Bard Optima without tether  Indication: Elizabeth Trujillo is a 86 y.o. patient with personal history of CIS and upper tract TCC in 2020 status post localized treatment along with BCG with the stent in place.  CT urogram most recently concerning for recurrence also with calcification at the same level..  After reviewing the management options for treatment, she elected to proceed with the above surgical procedure(s). We have discussed the potential benefits and risks of the procedure, side effects of the proposed treatment, the likelihood of the patient achieving the goals of the procedure, and any potential problems that might occur during the procedure or recuperation. Informed consent has been obtained.  Description of procedure:  The patient was taken to the operating room and general anesthesia was induced.  The patient was placed in the dorsal lithotomy  position, prepped and draped in the usual sterile fashion, and preoperative antibiotics were administered. A preoperative time-out was performed.   A 21 French cystoscope was advanced per urethra into the bladder.  The bladder was inspected circumferentially and was noted to be normal without tumors masses or lesions.  Attention was turned to the left UO which was intubated using a 5 [place] open-ended ureteral catheter.  Retrograde pyelogram revealed decompressed distal ureter with a transition point approximately 2 cm proximal to the UVJ with a small about 5 mm filling defect and proximal hydroureteronephrosis down to this level.  A sensor wire was then placed up to the level of the kidney which went easily.  A semirigid ureteroscope was advanced alongside the wire to the area of abnormality which appeared to be an area of circumferential fullness with slight erythema but no obvious papillary or nodule tumor was noted.  A second sensor wire was then used to perform a railroad technique and advanced the scope beyond this area where the ureter appeared to be normal.  I was able to advance the scope all the way up to the proximal ureter and the remainder of the ureter was unremarkable.  I then backed the scope back to the area of abnormality, took some pictures however these failed to present.  I used Piranha forcep biopsies to biopsy the area of fullness x3.  Sexually opened up the area somewhat.  These were passed off as left ureteral biopsies.  Again based on the visual appearance, was not apparently clear whether this was edema where the stone had been lodged or recurrent tumor.  Certainly, it was intraluminal rather than extrinsic.  A final retrograde pyelogram showed no contrast extravasation.  A 6 x 24 Bard  Optima stent was advanced over the wire up to the level of the kidney.  Upon wire withdrawal, there was a full coil noted within the renal pelvis as well as within the bladder.  The string was not left on  the stent.  The bladder was then drained.  The patient was then cleaned and dried, repositioned in the supine position, reversed from anesthesia, and taken to the PACU in stable condition.  Plan: We will have her return later this week hopefully with the pathology results, otherwise we will need to reschedule her appointment to next week.  Once we know the pathology, will consider additional localized therapy.    Hollice Espy, M.D.

## 2022-01-09 NOTE — Interval H&P Note (Signed)
History and Physical Interval Note:  01/09/2022 1:55 PM  Elizabeth Trujillo  has presented today for surgery, with the diagnosis of Left Hydronephrosis, History of Left Ureteral Cancer.  The various methods of treatment have been discussed with the patient and family. After consideration of risks, benefits and other options for treatment, the patient has consented to  Procedure(s): URETEROSCOPY (Left) CYSTOSCOPY WITH RETROGRADE PYELOGRAM/URETERAL STENT PLACEMENT (Left) URETERAL BIOPSY/TUMOR ABLATION VS LASER LITHOTRIPSY (Left) HOLMIUM LASER APPLICATION (Left) as a surgical intervention.  The patient's history has been reviewed, patient examined, no change in status, stable for surgery.  I have reviewed the patient's chart and labs.  Questions were answered to the patient's satisfaction.    RRR CTAB  Pretreated with abx Hollice Espy

## 2022-01-09 NOTE — Discharge Instructions (Addendum)
You have a ureteral stent in place.  This is a tube that extends from your kidney to your bladder.  This may cause urinary bleeding, burning with urination, and urinary frequency.  Please call our office or present to the ED if you develop fevers >101 or pain which is not able to be controlled with oral pain medications.  You may be given either Flomax and/ or ditropan to help with bladder spasms and stent pain in addition to pain medications.    [facility] [address], [address] [place], [place] [address] [phone]  AMBULATORY SURGERY  DISCHARGE INSTRUCTIONS   The drugs that you were given will stay in your system until tomorrow so for the next 24 hours you should not:  Drive an automobile Make any legal decisions Drink any alcoholic beverage   You may resume regular meals tomorrow.  Today it is better to start with liquids and gradually work up to solid foods.  You may eat anything you prefer, but it is better to start with liquids, then soup and crackers, and gradually work up to solid foods.   Please notify your doctor immediately if you have any unusual bleeding, trouble breathing, redness and pain at the surgery site, drainage, fever, or pain not relieved by medication.    Additional Instructions:   Please contact your physician with any problems or Same Day Surgery at [phone], Monday through Friday 6 am to 4 pm, or  at [place] [facility] number at [phone].  

## 2022-01-09 NOTE — Anesthesia Preprocedure Evaluation (Signed)
Anesthesia Evaluation  Patient identified by MRN, date of birth, ID band Patient awake    Reviewed: Allergy & Precautions, NPO status , Patient's Chart, lab work & pertinent test results  History of Anesthesia Complications Negative for: history of anesthetic complications  Airway Mallampati: III  TM Distance: <3 FB Neck ROM: full    Dental  (+) Chipped   Pulmonary neg pulmonary ROS, neg shortness of breath,    Pulmonary exam normal        Cardiovascular Exercise Tolerance: Good hypertension, (-) anginaNormal cardiovascular exam     Neuro/Psych Seizures -,  TIACVA, Residual Symptoms negative psych ROS   GI/Hepatic Neg liver ROS, GERD  Controlled,  Endo/Other  negative endocrine ROSdiabetes  Renal/GU      Musculoskeletal   Abdominal   Peds  Hematology negative hematology ROS (+)   Anesthesia Other Findings Past Medical History: No date: Anemia No date: Arthritis 1986: Breast cancer ([place])     Comment:  left mastectomy 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     Comment:  BCG tx per pt No date: Collagen vascular disease ([place])     Comment:  RA  No date: Diabetes mellitus without complication (HCC) No date: Elevated triglycerides with high cholesterol No date: Family history of bladder cancer No date: Family history of breast cancer No date: Family history of lung cancer No date: GERD (gastroesophageal reflux disease)     Comment:  history of reflux No date: History of cancer of ureter No date: Hypercholesterolemia No date: Hypertension     Comment:  history of  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, left ([place])     Comment:  Surgical resection and BCG  tx per pt No date: Seizure ([place])     Comment:  last seizure was early 2023 No date: Stroke [facility])     Comment:  TIA-possible seizure instead of tia per neurologist No 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[place])     Comment:  Left Ureter No date: UTI (urinary  tract infection)  Past Surgical History: 09/1984: ABDOMINAL HYSTERECTOMY     Comment:  TAH/BSO 11/11/2018: BIOPSY; Left     Comment:  Procedure: BIOPSY;  Surgeon: Hollice Espy, MD;                Location: [facility] ORS;  Service: Urology;  Laterality: Left; 01/23/2013: BREAST BIOPSY; Right     Comment:  [place] bx/clip-neg 07/1984: BREAST SURGERY; Left     Comment:  Mastectomy No date: callus removed from left toe 2015: CATARACT EXTRACTION W/ INTRAOCULAR LENS IMPLANT; Left No date: COLONOSCOPY 02/24/2019: CYSTOSCOPY W/ RETROGRADES; Left     Comment:  Procedure: CYSTOSCOPY WITH RETROGRADE PYELOGRAM;                Surgeon: Hollice Espy, MD;  Location: [facility] ORS;                Service: Urology;  Laterality: Left; 02/24/2019: CYSTOSCOPY WITH BIOPSY; N/A     Comment:  Procedure: CYSTOSCOPY WITH Bladder BIOPSY;  Surgeon:               Hollice Espy, MD;  Location: [facility] ORS;  Service:               Urology;  Laterality: N/A; 02/24/2019: CYSTOSCOPY WITH FULGERATION; N/A     Comment:  Procedure: CYSTOSCOPY WITH FULGERATION;  Surgeon:               Hollice Espy, MD;  Location: [facility] ORS;  Service:  Urology;  Laterality: N/A; 11/24/2019: CYSTOSCOPY WITH RETROGRADE PYELOGRAM, URETEROSCOPY AND  STENT PLACEMENT; Left     Comment:  Procedure: CYSTOSCOPY WITH RETROGRADE PYELOGRAM,               URETEROSCOPY;  Surgeon: Hollice Espy, MD;  Location:               [facility] OR[facility];  Service: Urology;  Laterality: Left; 10/14/2018: CYSTOSCOPY WITH URETEROSCOPY AND STENT PLACEMENT; Left     Comment:  Procedure: CYSTOSCOPY WITH URETEROSCOPY AND STENT               PLACEMENT;  Surgeon: Hollice Espy, MD;  Location: [facility]              ORS;  Service: Urology;  Laterality: Left; 02/24/2019: CYSTOSCOPY WITH URETEROSCOPY AND STENT PLACEMENT; Left     Comment:  Procedure: CYSTOSCOPY WITH URETEROSCOPY AND STENT               Exchange;  Surgeon: Hollice Espy, MD;  Location: [facility]                ORS;  Service: Urology;  Laterality: Left; 11/11/2018: CYSTOSCOPY/URETEROSCOPY/HOLMIUM LASER/STENT PLACEMENT;  Left     Comment:  Procedure: CYSTOSCOPY/URETEROSCOPY//STENT EXCHANGE;                Surgeon: Hollice Espy, MD;  Location: [facility];                Service: Urology;  Laterality: Left; 2013: EYE SURGERY; Left 04/2013: FOOT SURGERY; Left 10/14/2018: HOLMIUM LASER APPLICATION; Left     Comment:  Procedure: HOLMIUM LASER APPLICATION;  Surgeon: Hollice Espy, MD;  Location: [facility];  Service: Urology;                Laterality: Left; No date: JOINT REPLACEMENT 11/03/2015: KNEE ARTHROPLASTY; Right     Comment:  Procedure: COMPUTER ASSISTED TOTAL KNEE ARTHROPLASTY;                Surgeon: Dereck Leep, MD;  Location: [facility];                Service: Orthopedics;  Laterality: Right; 1986: MASTECTOMY; Left     Comment:  positive 03/23/2021: TOTAL KNEE ARTHROPLASTY; Left     Comment:  Procedure: TOTAL KNEE ARTHROPLASTY;  Surgeon: Lovell Sheehan, MD;  Location: [facility];  Service: Orthopedics;               Laterality: Left; 10/14/2018: URETERAL BIOPSY; Left     Comment:  Procedure: URETERAL BIOPSY;  Surgeon: Hollice Espy,               MD;  Location: [facility];  Service: Urology;  Laterality:               Left; 02/24/2019: URETERAL BIOPSY; Left     Comment:  Procedure: URETERAL BIOPSY;  Surgeon: Hollice Espy,               MD;  Location: [facility] OR[facility];  Service: Urology;  Laterality:               Left;     Reproductive/Obstetrics negative OB ROS  Anesthesia Physical Anesthesia Plan  ASA: 3  Anesthesia Plan: General ETT   Post-op Pain Management:    Induction: Intravenous  PONV Risk Score and Plan: Ondansetron, Dexamethasone, Midazolam and Treatment may vary due to age or medical condition  Airway Management Planned: Oral ETT  Additional Equipment:   Intra-op Plan:    Post-operative Plan: Extubation in OR  Informed Consent: I have reviewed the patients History and Physical, chart, labs and discussed the procedure including the risks, benefits and alternatives for the proposed anesthesia with the patient or authorized representative who has indicated his/her understanding and acceptance.     Dental Advisory Given  Plan Discussed with: Anesthesiologist, CRNA and Surgeon  Anesthesia Plan Comments: (Patient consented for risks of anesthesia including but not limited to:  - adverse reactions to medications - damage to eyes, teeth, lips or other oral mucosa - nerve damage due to positioning  - sore throat or hoarseness - Damage to heart, brain, nerves, lungs, other parts of body or loss of life  Patient voiced understanding.)        Anesthesia Quick Evaluation

## 2022-01-10 ENCOUNTER — Encounter: Payer: Self-pay | Admitting: Urology

## 2022-01-10 NOTE — Anesthesia Postprocedure Evaluation (Signed)
Anesthesia Post Note  [name]  Procedure(s) Performed: URETEROSCOPY (Left) CYSTOSCOPY WITH RETROGRADE PYELOGRAM/URETERAL STENT PLACEMENT (Left) URETERAL BIOPSY/TUMOR ABLATION (Left)  Patient location during evaluation: PACU Anesthesia Type: General Level of consciousness: awake and alert Pain management: pain level controlled Vital Signs Assessment: post-procedure vital signs reviewed and stable Respiratory status: spontaneous breathing, nonlabored ventilation, respiratory function stable and patient connected to nasal cannula oxygen Cardiovascular status: blood pressure returned to baseline and stable Postop Assessment: no apparent nausea or vomiting Anesthetic complications: no   No notable events documented.   Last Vitals:  Vitals:   01/09/22 1609 01/09/22 1629  BP: (!) 150/75 (!) 147/81  Pulse: 64 63  Resp: 16 18  Temp: (!) 36.3 C 36.8 C  SpO2: 97% 95%    Last Pain:  Vitals:   01/09/22 1629  TempSrc: Temporal  PainSc: 0-No pain                 Precious Haws [name]

## 2022-01-11 LAB — SURGICAL PATHOLOGY

## 2022-01-13 ENCOUNTER — Ambulatory Visit (INDEPENDENT_AMBULATORY_CARE_PROVIDER_SITE_OTHER): Payer: Medicare Other | Admitting: Urology

## 2022-01-13 ENCOUNTER — Other Ambulatory Visit
Admission: RE | Admit: 2022-01-13 | Discharge: 2022-01-13 | Disposition: A | Discharge status: Home / Self Care | Payer: Medicare Other | Attending: Urology | Admitting: Urology

## 2022-01-13 ENCOUNTER — Encounter: Payer: Self-pay | Admitting: Urology

## 2022-01-13 VITALS — Ht 62.0 in | Wt 203.0 lb

## 2022-01-13 DIAGNOSIS — C662 Malignant neoplasm of left ureter: Secondary | ICD-10-CM | POA: Diagnosis not present

## 2022-01-13 DIAGNOSIS — N390 Urinary tract infection, site not specified: Secondary | ICD-10-CM

## 2022-01-13 DIAGNOSIS — Z87440 Personal history of urinary (tract) infections: Secondary | ICD-10-CM

## 2022-01-13 DIAGNOSIS — R310 Gross hematuria: Secondary | ICD-10-CM | POA: Diagnosis not present

## 2022-01-13 LAB — URINALYSIS, COMPLETE (UACMP) WITH MICROSCOPIC
Bilirubin Urine: NEGATIVE
Glucose, UA: NEGATIVE mg/dL
Ketones, ur: NEGATIVE mg/dL
Nitrite: NEGATIVE
Protein, ur: >300 mg/dL — AB
RBC / HPF: >50 RBC/hpf (ref 0–5)
Specific Gravity, Urine: 1.025 (ref 1.005–1.030)
pH: 5.5 (ref 5.0–8.0)

## 2022-01-13 NOTE — Progress Notes (Signed)
01/13/2022 1:48 PM   Elizabeth Trujillo 1935-05-22 [id number]  Referring provider: [name]., MD No address on file  Chief Complaint  Patient presents with   Follow-up    HPI: 86 year old female with recurrent left distal ureteral high-grade TCC who presents today to discuss pathology.  She was taken to the operating room earlier this week for diagnostic ureteroscopy with left distal ureteral biopsies and area of fullness.  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  Stent was placed.  She returns today accompanied by her husband and daughter.  They have many questions about how to proceed moving forward.  She notes today, she is having more discomfort from her stent this time.  She is taking hydrocodone for pain but is hesitant to take oxybutynin and Flomax because she was told by the pharmacist that she may be allergic to Flomax due to her sulfa allergy.   PMH: Past Medical History:  Diagnosis Date   Anemia    Arthritis    Breast cancer ([place]) 1986   left mastectomy   carcinoma in situ of urinary bladder 2019   BCG tx per pt   Collagen vascular disease ([place])    RA    Diabetes mellitus without complication (HCC)    Elevated triglycerides with high cholesterol    Family history of bladder cancer    Family history of breast cancer    Family history of lung cancer    GERD (gastroesophageal reflux disease)    history of reflux   History of cancer of ureter    Hypercholesterolemia    Hypertension    history of    Primary ureteral papillary carcinoma, left ([place]) 2019   Surgical resection and BCG  tx per pt   Seizure ([place])    last seizure was early 2023   Stroke [facility])    TIA-possible seizure instead of tia per neurologist   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[place])    Left Ureter   UTI (urinary tract infection)     Surgical History: Past Surgical History:  Procedure Laterality Date    ABDOMINAL HYSTERECTOMY  09/1984   TAH/BSO   BIOPSY Left 11/11/2018   Procedure: BIOPSY;  Surgeon: Hollice Espy, MD;  Location: [facility];  Service: Urology;  Laterality: Left;   BREAST BIOPSY Right 01/23/2013   [place] bx/clip-neg   BREAST SURGERY Left 07/1984   Mastectomy   callus removed from left toe     CATARACT EXTRACTION W/ INTRAOCULAR LENS IMPLANT Left 2015   COLONOSCOPY     CYSTOSCOPY W/ RETROGRADES Left 02/24/2019   Procedure: CYSTOSCOPY WITH RETROGRADE PYELOGRAM;  Surgeon: Hollice Espy, MD;  Location: [facility];  Service: Urology;  Laterality: Left;   CYSTOSCOPY W/ URETERAL STENT PLACEMENT Left 01/09/2022   Procedure: CYSTOSCOPY WITH RETROGRADE PYELOGRAM/URETERAL STENT PLACEMENT;  Surgeon: Hollice Espy, MD;  Location: [facility];  Service: Urology;  Laterality: Left;   CYSTOSCOPY WITH BIOPSY N/A 02/24/2019   Procedure: CYSTOSCOPY WITH Bladder BIOPSY;  Surgeon: Hollice Espy, MD;  Location: [facility];  Service: Urology;  Laterality: N/A;   CYSTOSCOPY WITH FULGERATION N/A 02/24/2019   Procedure: CYSTOSCOPY WITH FULGERATION;  Surgeon: Hollice Espy, MD;  Location: [facility];  Service: Urology;  Laterality: N/A;   CYSTOSCOPY WITH RETROGRADE PYELOGRAM, URETEROSCOPY AND STENT PLACEMENT Left 11/24/2019   Procedure: CYSTOSCOPY WITH RETROGRADE PYELOGRAM, URETEROSCOPY;  Surgeon: Hollice Espy, MD;  Location: [facility];  Service: Urology;  Laterality: Left;   CYSTOSCOPY WITH URETEROSCOPY  AND STENT PLACEMENT Left 10/14/2018   Procedure: CYSTOSCOPY WITH URETEROSCOPY AND STENT PLACEMENT;  Surgeon: Hollice Espy, MD;  Location: [facility];  Service: Urology;  Laterality: Left;   CYSTOSCOPY WITH URETEROSCOPY AND STENT PLACEMENT Left 02/24/2019   Procedure: CYSTOSCOPY WITH URETEROSCOPY AND STENT Exchange;  Surgeon: Hollice Espy, MD;  Location: [facility];  Service: Urology;  Laterality: Left;   CYSTOSCOPY/URETEROSCOPY/HOLMIUM LASER/STENT PLACEMENT Left 11/11/2018   Procedure:  CYSTOSCOPY/URETEROSCOPY//STENT EXCHANGE;  Surgeon: Hollice Espy, MD;  Location: [facility];  Service: Urology;  Laterality: Left;   EYE SURGERY Left 2013   FOOT SURGERY Left 04/2013   HOLMIUM LASER APPLICATION Left 36/64/4034   Procedure: HOLMIUM LASER APPLICATION;  Surgeon: Hollice Espy, MD;  Location: [facility];  Service: Urology;  Laterality: Left;   JOINT REPLACEMENT     KNEE ARTHROPLASTY Right 11/03/2015   Procedure: COMPUTER ASSISTED TOTAL KNEE ARTHROPLASTY;  Surgeon: Dereck Leep, MD;  Location: [facility];  Service: Orthopedics;  Laterality: Right;   MASTECTOMY Left 1986   positive   TOTAL KNEE ARTHROPLASTY Left 03/23/2021   Procedure: TOTAL KNEE ARTHROPLASTY;  Surgeon: Lovell Sheehan, MD;  Location: [facility];  Service: Orthopedics;  Laterality: Left;   URETERAL BIOPSY Left 10/14/2018   Procedure: URETERAL BIOPSY;  Surgeon: Hollice Espy, MD;  Location: [facility];  Service: Urology;  Laterality: Left;   URETERAL BIOPSY Left 02/24/2019   Procedure: URETERAL BIOPSY;  Surgeon: Hollice Espy, MD;  Location: [facility];  Service: Urology;  Laterality: Left;   URETERAL BIOPSY Left 01/09/2022   Procedure: URETERAL BIOPSY/TUMOR ABLATION;  Surgeon: Hollice Espy, MD;  Location: [facility];  Service: Urology;  Laterality: Left;   URETEROSCOPY Left 01/09/2022   Procedure: URETEROSCOPY;  Surgeon: Hollice Espy, MD;  Location: [facility];  Service: Urology;  Laterality: Left;    Home Medications:  Allergies as of 01/13/2022       Reactions   Penicillin V Potassium Hives   Has patient had a PCN reaction causing immediate rash, facial/tongue/throat swelling, SOB or lightheadedness with hypotension: no Has patient had a PCN reaction causing severe rash involving mucus membranes or skin necrosis: {no Has patient had a PCN reaction that required hospitalization no Has patient had a PCN reaction occurring within the last 10 years: no If all of the above answers are "NO", then may proceed with  Cephalosporin use.   Sulfa Antibiotics Hives, Itching        Medication List        Accurate as of January 13, 2022  1:48 PM. If you have any questions, ask your nurse or doctor.          acetaminophen 500 MG tablet Commonly known as: TYLENOL Take 1,000 mg by mouth every 6 (six) hours as needed for moderate pain (pain.).   aspirin 81 MG chewable tablet Chew 1 tablet (81 mg total) by mouth 2 (two) times daily. What changed: when to take this   atorvastatin 40 MG tablet Commonly known as: LIPITOR Take 1 tablet by mouth once daily What changed: when to take this   CALCIUM+D3 PO Take 1 tablet by mouth daily.   cholecalciferol 25 MCG (1000 UNIT) tablet Commonly known as: VITAMIN D3 Take 1,000 Units by mouth at bedtime.   donepezil 5 MG tablet Commonly known as: ARICEPT Take 5 mg by mouth daily with lunch.   HYDROcodone-acetaminophen 5-325 MG tablet Commonly known as: NORCO/VICODIN Take 1-2 tablets by mouth every 6 (six) hours as needed for moderate pain.   hydrocortisone 2.5 %  ointment Apply topically as needed (rash; hemorrhoids).   ketoconazole 2 % shampoo Commonly known as: NIZORAL Apply 1 application topically as needed (flaky/itchy scalp). Up to once per week   levETIRAcetam 500 MG tablet Commonly known as: KEPPRA Take 500 mg by mouth 2 (two) times daily.   metFORMIN 500 MG tablet Commonly known as: GLUCOPHAGE Take 1 tablet by mouth once daily with breakfast What changed: when to take this   multivitamin with minerals Tabs tablet Take 1 tablet by mouth daily at 2 PM.   nitrofurantoin (macrocrystal-monohydrate) 100 MG capsule Commonly known as: Macrobid Take 1 capsule (100 mg total) by mouth 2 (two) times daily.   nystatin cream Commonly known as: MYCOSTATIN Apply 1 application  topically daily as needed for dry skin (yeast infection).   oxybutynin 5 MG tablet Commonly known as: DITROPAN Take 1 tablet (5 mg total) by mouth every 8 (eight) hours  as needed for bladder spasms.   tamsulosin 0.4 MG Caps capsule Commonly known as: Flomax Take 1 capsule (0.4 mg total) by mouth daily.        Allergies:  Allergies  Allergen Reactions   Penicillin V Potassium Hives    Has patient had a PCN reaction causing immediate rash, facial/tongue/throat swelling, SOB or lightheadedness with hypotension: no Has patient had a PCN reaction causing severe rash involving mucus membranes or skin necrosis: {no Has patient had a PCN reaction that required hospitalization no Has patient had a PCN reaction occurring within the last 10 years: no If all of the above answers are "NO", then may proceed with Cephalosporin use.   Sulfa Antibiotics Hives and Itching    Family History: Family History  Problem Relation Age of Onset   Glaucoma Mother    Kidney disease Mother    Arthritis Mother    Stroke Mother    Breast cancer Mother 3   Bladder Cancer Mother 20   Heart disease Father    Cancer Brother 94       lung cancer, smoker   Breast cancer Cousin        3 mat cousins dx late 78s   Breast cancer Cousin        1 mat cousin dx 60s-60s    Social History:  reports that she has never smoked. She has never used smokeless tobacco. She reports that she does not drink alcohol and does not use drugs.   Physical Exam: Ht '5\' 2"'$  (1.575 m)   Wt 203 lb (92.1 kg)   BMI 37.13 kg/m   Constitutional:  Alert and oriented, No acute distress. HEENT:  AT, moist mucus membranes.  Trachea midline, no masses. Cardiovascular: No clubbing, cyanosis, or edema. Respiratory: Normal respiratory effort, no increased work of breathing. GI: Abdomen is soft, nontender, nondistended, no abdominal masses GU: No CVA tenderness Skin: No rashes, bruises or suspicious lesions. Neurologic: Grossly intact, no focal deficits, moving all 4 extremities. Psychiatric: Normal mood and affect.  Laboratory Data: Lab Results  Component Value Date   WBC 8.0 12/09/2021   HGB 13.3  12/09/2021   HCT 39.3 12/09/2021   MCV 90.3 12/09/2021   PLT 271 12/09/2021    Lab Results  Component Value Date   CREATININE 0.63 12/09/2021     Lab Results  Component Value Date   HGBA1C 5.9 (H) 12/09/2021    Urinalysis Results for orders placed or performed during the hospital encounter of 01/09/22  Glucose, capillary  Result Value Ref Range   Glucose-Capillary 115 (H) 70 -  99 mg/dL  Glucose, capillary  Result Value Ref Range   Glucose-Capillary 111 (H) 70 - 99 mg/dL  Surgical pathology  Result Value Ref Range   SURGICAL PATHOLOGY      SURGICAL PATHOLOGY CASE: [id number] [name] Surgical Pathology Report     Specimen Submitted: A. Ureter, left; bx  Clinical History: Left hydronephrosis, Hx of left ureteral cancer.    DIAGNOSIS: A. URETER, LEFT; BIOPSY: - NONINVASIVE HIGH-GRADE UROTHELIAL CARCINOMA (WHO/ISUP). - NO MUSCULARIS PROPRIA IDENTIFIED.  GROSS DESCRIPTION: A. Labeled: Ureteral biopsy (left) Received: Fresh Collection time: 2:21 PM on 01/09/2022 Placed into formalin time: 3:35 PM on 01/09/2022 Tissue fragment(s): Multiple Size: Aggregate, 0.4 x 0.1 x 0.1 cm Description: Tan to brown soft tissue fragments Entirely submitted in 1 cassette.  CM 01/10/2022  Final Diagnosis performed by Allena Napoleon, MD.   Electronically signed 01/11/2022 11:28:17AM The electronic signature indicates that the named Attending Pathologist has evaluated the specimen Technical component performed at [facility], [address], [place], [place] [address] Lab: [phone] Dir: Rush Farmer , MD, MMM  Professional component performed at [facility], [facility], [place], [place], [place] [address] Lab: [phone] Dir: Kathi Simpers, MD      Assessment & Plan:    1. 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  We discussed alternative options including nephro ureterectomy or distal ureterectomy which she and her family all agree is not a reasonable approach given her age and comorbidities.  We discussed that once we have focally or locally ablated the tumor, we will plan for another induction round of BCG 4 to 6 weeks postop with ureteral stent in place to allow reflux along with stent.  In the past, this worked quite well with regression of this tumor for several years.  Risk and benefits discussed including risk of bleeding, infection, damage surrounding structures, ureteral perforation, failure to cure the tumor amongst others.  All questions were answered.  Preop UA/urine culture today.  2. Recurrent UTI We will likely treat periprocedurally as on previous occasions - Urinalysis, Complete w Microscopic; Future - Urine Culture; Future    Hollice Espy, MD  [facility] [address] [place], [place] [address] [phone]

## 2022-01-13 NOTE — H&P (View-Only) (Signed)
01/13/2022 1:48 PM   Elizabeth Trujillo Jun 22, 1935 [id number]  Referring provider: [name]., MD No address on file  Chief Complaint  Patient presents with   Follow-up    HPI: 86 year old female with recurrent left distal ureteral high-grade TCC who presents today to discuss pathology.  She was taken to the operating room earlier this week for diagnostic ureteroscopy with left distal ureteral biopsies and area of fullness.  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  Stent was placed.  She returns today accompanied by her husband and daughter.  They have many questions about how to proceed moving forward.  She notes today, she is having more discomfort from her stent this time.  She is taking hydrocodone for pain but is hesitant to take oxybutynin and Flomax because she was told by the pharmacist that she may be allergic to Flomax due to her sulfa allergy.   PMH: Past Medical History:  Diagnosis Date   Anemia    Arthritis    Breast cancer ([place]) 1986   left mastectomy   carcinoma in situ of urinary bladder 2019   BCG tx per pt   Collagen vascular disease ([place])    RA    Diabetes mellitus without complication (HCC)    Elevated triglycerides with high cholesterol    Family history of bladder cancer    Family history of breast cancer    Family history of lung cancer    GERD (gastroesophageal reflux disease)    history of reflux   History of cancer of ureter    Hypercholesterolemia    Hypertension    history of    Primary ureteral papillary carcinoma, left ([place]) 2019   Surgical resection and BCG  tx per pt   Seizure ([place])    last seizure was early 2023   Stroke [facility])    TIA-possible seizure instead of tia per neurologist   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[place])    Left Ureter   UTI (urinary tract infection)     Surgical History: Past Surgical History:  Procedure Laterality Date    ABDOMINAL HYSTERECTOMY  09/1984   TAH/BSO   BIOPSY Left 11/11/2018   Procedure: BIOPSY;  Surgeon: Hollice Espy, MD;  Location: [facility];  Service: Urology;  Laterality: Left;   BREAST BIOPSY Right 01/23/2013   [place] bx/clip-neg   BREAST SURGERY Left 07/1984   Mastectomy   callus removed from left toe     CATARACT EXTRACTION W/ INTRAOCULAR LENS IMPLANT Left 2015   COLONOSCOPY     CYSTOSCOPY W/ RETROGRADES Left 02/24/2019   Procedure: CYSTOSCOPY WITH RETROGRADE PYELOGRAM;  Surgeon: Hollice Espy, MD;  Location: [facility];  Service: Urology;  Laterality: Left;   CYSTOSCOPY W/ URETERAL STENT PLACEMENT Left 01/09/2022   Procedure: CYSTOSCOPY WITH RETROGRADE PYELOGRAM/URETERAL STENT PLACEMENT;  Surgeon: Hollice Espy, MD;  Location: [facility];  Service: Urology;  Laterality: Left;   CYSTOSCOPY WITH BIOPSY N/A 02/24/2019   Procedure: CYSTOSCOPY WITH Bladder BIOPSY;  Surgeon: Hollice Espy, MD;  Location: [facility];  Service: Urology;  Laterality: N/A;   CYSTOSCOPY WITH FULGERATION N/A 02/24/2019   Procedure: CYSTOSCOPY WITH FULGERATION;  Surgeon: Hollice Espy, MD;  Location: [facility];  Service: Urology;  Laterality: N/A;   CYSTOSCOPY WITH RETROGRADE PYELOGRAM, URETEROSCOPY AND STENT PLACEMENT Left 11/24/2019   Procedure: CYSTOSCOPY WITH RETROGRADE PYELOGRAM, URETEROSCOPY;  Surgeon: Hollice Espy, MD;  Location: [facility];  Service: Urology;  Laterality: Left;   CYSTOSCOPY WITH URETEROSCOPY  AND STENT PLACEMENT Left 10/14/2018   Procedure: CYSTOSCOPY WITH URETEROSCOPY AND STENT PLACEMENT;  Surgeon: Hollice Espy, MD;  Location: [facility];  Service: Urology;  Laterality: Left;   CYSTOSCOPY WITH URETEROSCOPY AND STENT PLACEMENT Left 02/24/2019   Procedure: CYSTOSCOPY WITH URETEROSCOPY AND STENT Exchange;  Surgeon: Hollice Espy, MD;  Location: [facility];  Service: Urology;  Laterality: Left;   CYSTOSCOPY/URETEROSCOPY/HOLMIUM LASER/STENT PLACEMENT Left 11/11/2018   Procedure:  CYSTOSCOPY/URETEROSCOPY//STENT EXCHANGE;  Surgeon: Hollice Espy, MD;  Location: [facility];  Service: Urology;  Laterality: Left;   EYE SURGERY Left 2013   FOOT SURGERY Left 04/2013   HOLMIUM LASER APPLICATION Left 41/66/0630   Procedure: HOLMIUM LASER APPLICATION;  Surgeon: Hollice Espy, MD;  Location: [facility];  Service: Urology;  Laterality: Left;   JOINT REPLACEMENT     KNEE ARTHROPLASTY Right 11/03/2015   Procedure: COMPUTER ASSISTED TOTAL KNEE ARTHROPLASTY;  Surgeon: Dereck Leep, MD;  Location: [facility];  Service: Orthopedics;  Laterality: Right;   MASTECTOMY Left 1986   positive   TOTAL KNEE ARTHROPLASTY Left 03/23/2021   Procedure: TOTAL KNEE ARTHROPLASTY;  Surgeon: Lovell Sheehan, MD;  Location: [facility];  Service: Orthopedics;  Laterality: Left;   URETERAL BIOPSY Left 10/14/2018   Procedure: URETERAL BIOPSY;  Surgeon: Hollice Espy, MD;  Location: [facility];  Service: Urology;  Laterality: Left;   URETERAL BIOPSY Left 02/24/2019   Procedure: URETERAL BIOPSY;  Surgeon: Hollice Espy, MD;  Location: [facility];  Service: Urology;  Laterality: Left;   URETERAL BIOPSY Left 01/09/2022   Procedure: URETERAL BIOPSY/TUMOR ABLATION;  Surgeon: Hollice Espy, MD;  Location: [facility];  Service: Urology;  Laterality: Left;   URETEROSCOPY Left 01/09/2022   Procedure: URETEROSCOPY;  Surgeon: Hollice Espy, MD;  Location: [facility];  Service: Urology;  Laterality: Left;    Home Medications:  Allergies as of 01/13/2022       Reactions   Penicillin V Potassium Hives   Has patient had a PCN reaction causing immediate rash, facial/tongue/throat swelling, SOB or lightheadedness with hypotension: no Has patient had a PCN reaction causing severe rash involving mucus membranes or skin necrosis: {no Has patient had a PCN reaction that required hospitalization no Has patient had a PCN reaction occurring within the last 10 years: no If all of the above answers are "NO", then may proceed with  Cephalosporin use.   Sulfa Antibiotics Hives, Itching        Medication List        Accurate as of January 13, 2022  1:48 PM. If you have any questions, ask your nurse or doctor.          acetaminophen 500 MG tablet Commonly known as: TYLENOL Take 1,000 mg by mouth every 6 (six) hours as needed for moderate pain (pain.).   aspirin 81 MG chewable tablet Chew 1 tablet (81 mg total) by mouth 2 (two) times daily. What changed: when to take this   atorvastatin 40 MG tablet Commonly known as: LIPITOR Take 1 tablet by mouth once daily What changed: when to take this   CALCIUM+D3 PO Take 1 tablet by mouth daily.   cholecalciferol 25 MCG (1000 UNIT) tablet Commonly known as: VITAMIN D3 Take 1,000 Units by mouth at bedtime.   donepezil 5 MG tablet Commonly known as: ARICEPT Take 5 mg by mouth daily with lunch.   HYDROcodone-acetaminophen 5-325 MG tablet Commonly known as: NORCO/VICODIN Take 1-2 tablets by mouth every 6 (six) hours as needed for moderate pain.   hydrocortisone 2.5 %  ointment Apply topically as needed (rash; hemorrhoids).   ketoconazole 2 % shampoo Commonly known as: NIZORAL Apply 1 application topically as needed (flaky/itchy scalp). Up to once per week   levETIRAcetam 500 MG tablet Commonly known as: KEPPRA Take 500 mg by mouth 2 (two) times daily.   metFORMIN 500 MG tablet Commonly known as: GLUCOPHAGE Take 1 tablet by mouth once daily with breakfast What changed: when to take this   multivitamin with minerals Tabs tablet Take 1 tablet by mouth daily at 2 PM.   nitrofurantoin (macrocrystal-monohydrate) 100 MG capsule Commonly known as: Macrobid Take 1 capsule (100 mg total) by mouth 2 (two) times daily.   nystatin cream Commonly known as: MYCOSTATIN Apply 1 application  topically daily as needed for dry skin (yeast infection).   oxybutynin 5 MG tablet Commonly known as: DITROPAN Take 1 tablet (5 mg total) by mouth every 8 (eight) hours  as needed for bladder spasms.   tamsulosin 0.4 MG Caps capsule Commonly known as: Flomax Take 1 capsule (0.4 mg total) by mouth daily.        Allergies:  Allergies  Allergen Reactions   Penicillin V Potassium Hives    Has patient had a PCN reaction causing immediate rash, facial/tongue/throat swelling, SOB or lightheadedness with hypotension: no Has patient had a PCN reaction causing severe rash involving mucus membranes or skin necrosis: {no Has patient had a PCN reaction that required hospitalization no Has patient had a PCN reaction occurring within the last 10 years: no If all of the above answers are "NO", then may proceed with Cephalosporin use.   Sulfa Antibiotics Hives and Itching    Family History: Family History  Problem Relation Age of Onset   Glaucoma Mother    Kidney disease Mother    Arthritis Mother    Stroke Mother    Breast cancer Mother 28   Bladder Cancer Mother 49   Heart disease Father    Cancer Brother 57       lung cancer, smoker   Breast cancer Cousin        3 mat cousins dx late 68s   Breast cancer Cousin        1 mat cousin dx 20s-60s    Social History:  reports that she has never smoked. She has never used smokeless tobacco. She reports that she does not drink alcohol and does not use drugs.   Physical Exam: Ht '5\' 2"'$  (1.575 m)   Wt 203 lb (92.1 kg)   BMI 37.13 kg/m   Constitutional:  Alert and oriented, No acute distress. HEENT: [place] AT, moist mucus membranes.  Trachea midline, no masses. Cardiovascular: No clubbing, cyanosis, or edema. Respiratory: Normal respiratory effort, no increased work of breathing. GI: Abdomen is soft, nontender, nondistended, no abdominal masses GU: No CVA tenderness Skin: No rashes, bruises or suspicious lesions. Neurologic: Grossly intact, no focal deficits, moving all 4 extremities. Psychiatric: Normal mood and affect.  Laboratory Data: Lab Results  Component Value Date   WBC 8.0 12/09/2021   HGB 13.3  12/09/2021   HCT 39.3 12/09/2021   MCV 90.3 12/09/2021   PLT 271 12/09/2021    Lab Results  Component Value Date   CREATININE 0.63 12/09/2021     Lab Results  Component Value Date   HGBA1C 5.9 (H) 12/09/2021    Urinalysis Results for orders placed or performed during the hospital encounter of 01/09/22  Glucose, capillary  Result Value Ref Range   Glucose-Capillary 115 (H) 70 -  99 mg/dL  Glucose, capillary  Result Value Ref Range   Glucose-Capillary 111 (H) 70 - 99 mg/dL  Surgical pathology  Result Value Ref Range   SURGICAL PATHOLOGY      SURGICAL PATHOLOGY CASE: [id number] PATIENT: Delena Bali Surgical Pathology Report     Specimen Submitted: A. Ureter, left; bx  Clinical History: Left hydronephrosis, Hx of left ureteral cancer.    DIAGNOSIS: A. URETER, LEFT; BIOPSY: - NONINVASIVE HIGH-GRADE UROTHELIAL CARCINOMA (WHO/ISUP). - NO MUSCULARIS PROPRIA IDENTIFIED.  GROSS DESCRIPTION: A. Labeled: Ureteral biopsy (left) Received: Fresh Collection time: 2:21 PM on 01/09/2022 Placed into formalin time: 3:35 PM on 01/09/2022 Tissue fragment(s): Multiple Size: Aggregate, 0.4 x 0.1 x 0.1 cm Description: Tan to brown soft tissue fragments Entirely submitted in 1 cassette.  CM 01/10/2022  Final Diagnosis performed by Allena Napoleon, MD.   Electronically signed 01/11/2022 11:28:17AM The electronic signature indicates that the named Attending Pathologist has evaluated the specimen Technical component performed at [facility], [address], [place], [place] [address] Lab: [phone] Dir: Rush Farmer , MD, MMM  Professional component performed at [facility], [facility], [place], [place], [place] [address] Lab: [phone] Dir: Kathi Simpers, MD      Assessment & Plan:    1. 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  We discussed alternative options including nephro ureterectomy or distal ureterectomy which she and her family all agree is not a reasonable approach given her age and comorbidities.  We discussed that once we have focally or locally ablated the tumor, we will plan for another induction round of BCG 4 to 6 weeks postop with ureteral stent in place to allow reflux along with stent.  In the past, this worked quite well with regression of this tumor for several years.  Risk and benefits discussed including risk of bleeding, infection, damage surrounding structures, ureteral perforation, failure to cure the tumor amongst others.  All questions were answered.  Preop UA/urine culture today.  2. Recurrent UTI We will likely treat periprocedurally as on previous occasions - Urinalysis, Complete w Microscopic; Future - Urine Culture; Future    Hollice Espy, MD  [facility] [address] [place], [place] [address] [phone]

## 2022-01-15 LAB — URINE CULTURE

## 2022-01-16 ENCOUNTER — Other Ambulatory Visit: Payer: Self-pay | Admitting: Urology

## 2022-01-16 DIAGNOSIS — C662 Malignant neoplasm of left ureter: Secondary | ICD-10-CM

## 2022-01-16 NOTE — Progress Notes (Unsigned)
Surgical Physician Order Form [facility]  * Scheduling expectation : Next Available  *Length of Case:   *Clearance needed: no  *Anticoagulation Instructions: Hold all anticoagulants  *Aspirin Instructions: Ok to continue Aspirin  *Post-op visit Date/Instructions:   BCG x6 to begin 4 to 6 weeks after ablation  *Diagnosis:  Left distal ureteral tumor  *Procedure: left  ureteral laser ablation of ureteral tumor, left ureteral stent exchange, left retrograde pyelogram , instillation of intravesical chemotherapy   Additional orders: Gemcitabine 2000 mg  -Admit type: OUTpatient  -Anesthesia: General  -VTE Prophylaxis Standing Order SCD's       Other:   -Standing Lab Orders Per Anesthesia    Lab other: None  -Standing Test orders EKG/Chest x-ray per Anesthesia       Test other:   - Medications:  Ancef 2gm IV  -Other orders:   Please pretreat with 3 days of Macrobid twice a day for total of 5 days

## 2022-01-17 ENCOUNTER — Telehealth: Payer: Self-pay

## 2022-01-17 MED ORDER — NITROFURANTOIN MONOHYD MACRO 100 MG PO CAPS: 100.0000 mg | ORAL_CAPSULE | Freq: Two times a day (BID) | ORAL | 0 refills | Status: DC

## 2022-01-17 NOTE — Progress Notes (Signed)
[facility] Posting Form   Surgery Date/Time: Date: 01/23/2022  Surgeon: Dr. Hollice Espy, MD  Surgery Location: Day Surgery  Inpt ( No  )   Outpt (Yes)   Obs ( No  )   Diagnosis: C66.2 Urothelial Carcinoma of Left Distal Ureter  -CPT: 96222, [id number], [phone]  Surgery: Left Ureteral Laser Ablation of Ureteral Tumor, Left Ureteral Stent Exchange and Left Retrograde Pyelogram, Bladder Instillation of Gemcitabine  Stop Anticoagulations: Yes, may continue ASA  Cardiac/Medical/Pulmonary Clearance needed: no  *Orders entered into EPIC  Date: 01/17/22   *Case booked in EPIC  Date: 01/17/22  *Notified pt of Surgery: Date: 01/17/22  PRE-OP UA & CX: no  *Placed into Prior Authorization Work [place] Date: 01/17/22  Assistant/laser/rep:No

## 2022-01-17 NOTE — Telephone Encounter (Signed)
I spoke with Elizabeth Trujillo and her Daughter. We have discussed possible surgery dates and Monday November 13th, 2023 was agreed upon by all parties. Patient given information about surgery date, what to expect pre-operatively and post operatively.  We discussed that a Pre-Admission Testing office will be calling to set up the pre-op visit that will take place prior to surgery, and that these appointments are typically done over the phone with a Pre-Admissions RN.  Informed patient that our office will communicate any additional care to be provided after surgery. Patients questions or concerns were discussed during our call. Advised to call our office should there be any additional information, questions or concerns that arise. Patient verbalized understanding.

## 2022-01-18 MED ORDER — GEMCITABINE CHEMO FOR BLADDER INSTILLATION 2000 MG
2000.0000 mg | Freq: Once | INTRAVENOUS | Status: DC
  Filled 2022-01-18: qty 52.6

## 2022-01-20 ENCOUNTER — Other Ambulatory Visit: Payer: Self-pay

## 2022-01-20 ENCOUNTER — Encounter: Payer: Self-pay | Admitting: Urology

## 2022-01-20 NOTE — Patient Instructions (Addendum)
Your procedure is scheduled on: Monday 01/23/2022 Report to the Registration Desk on the 1st floor of the [facility]. To find out your arrival time, please call [phone] between 1PM - 3PM on: Friday 01/20/2022  REMEMBER: Instructions that are not followed completely may result in serious medical risk, up to and including death; or upon the discretion of your surgeon and anesthesiologist your surgery may need to be rescheduled.  Do not eat food after midnight the night before surgery.  No gum chewing, lozengers or hard candies.   TAKE THESE MEDICATIONS THE MORNING OF SURGERY WITH A SIP OF WATER: - levETIRAcetam (KEPPRA) 500 MG tablet  - donepezil (ARICEPT) 5 MG tablet   IF NEEDED YOU MAY ALSO TAKE - acetaminophen (TYLENOL) 500 MG tablet  - HYDROcodone-acetaminophen (NORCO/VICODIN) 5-325 MG tablet    Stop taking metFORMIN (GLUCOPHAGE) 500 MG tablet 2 days prior to procedure.   Follow recommendations from Cardiologist, Pulmonologist or PCP regarding stopping Aspirin, Coumadin, Plavix, Eliquis, Pradaxa, or Pletal.  One week prior to surgery: Stop Anti-inflammatories (NSAIDS) such as Advil, Aleve, Ibuprofen, Motrin, Naproxen, Naprosyn and Aspirin based products such as Excedrin, Goodys Powder, BC Powder. You may however, continue to take Tylenol if needed for pain up until the day of surgery.  Stop any over the counter vitamins and supplements until after surgery.   No Alcohol for 24 hours before or after surgery.  No Smoking including e-cigarettes for 24 hours prior to surgery.  No chewable tobacco products for at least 6 hours prior to surgery.  No nicotine patches on the day of surgery.  Do not use any "recreational" drugs for at least a week prior to your surgery.  Please be advised that the combination of cocaine and anesthesia may have negative outcomes, up to and including death. If you test positive for cocaine, your surgery will be cancelled.  On the morning of  surgery brush your teeth with toothpaste and water, you may rinse your mouth with mouthwash if you wish. Do not swallow any toothpaste or mouthwash.  Do not wear jewelry, make-up, hairpins, clips or nail polish.  Do not wear lotions, powders, or perfumes.   Do not shave body from the neck down 48 hours prior to surgery just in case you cut yourself which could leave a site for infection. .  Do not bring valuables to the hospital. [facility] is not responsible for any missing/lost belongings or valuables.   Notify your doctor if there is any change in your medical condition (cold, fever, infection).  Wear comfortable clothing (specific to your surgery type) to the hospital.  If you are being admitted to the hospital overnight, leave your suitcase in the car. After surgery it may be brought to your room.  If you are being discharged the day of surgery, you will not be allowed to drive home. You will need a responsible adult (18 years or older) to drive you home and stay with you that night.   If you are taking public transportation, you will need to have a responsible adult (18 years or older) with you. Please confirm with your physician that it is acceptable to use public transportation.   Please call the [place] Dept. at [phone] if you have any questions about these instructions.  Surgery Visitation Policy:  Patients undergoing a surgery or procedure may have two family members or support persons with them as long as the person is not COVID-19 positive or experiencing its symptoms.  Inpatient Visitation:    Visiting hours are 7 a.m. to 8 p.m. Up to four visitors are allowed at one time in a patient room, including children. The visitors may rotate out with other people during the day. One designated support person (adult) may remain overnight.

## 2022-01-22 MED ORDER — CEFAZOLIN SODIUM-DEXTROSE 2-4 GM/100ML-% IV SOLN
2.0000 g | INTRAVENOUS | Status: AC
  Administered 2022-01-23: 2 g via INTRAVENOUS

## 2022-01-22 MED ORDER — FAMOTIDINE 20 MG PO TABS: 20.0000 mg | ORAL_TABLET | Freq: Once | ORAL | Status: AC

## 2022-01-22 MED ORDER — SODIUM CHLORIDE 0.9 % IV SOLN: Freq: Once | INTRAVENOUS | Status: AC

## 2022-01-23 ENCOUNTER — Encounter: Payer: Self-pay | Admitting: Urology

## 2022-01-23 ENCOUNTER — Other Ambulatory Visit: Payer: Self-pay

## 2022-01-23 ENCOUNTER — Encounter
Admission: RE | Disposition: A | Discharge status: Home / Self Care | Payer: Self-pay | Source: Home / Self Care | Attending: Urology

## 2022-01-23 ENCOUNTER — Ambulatory Visit: Payer: Medicare Other | Admitting: Urgent Care

## 2022-01-23 ENCOUNTER — Ambulatory Visit
Admission: RE | Admit: 2022-01-23 | Discharge: 2022-01-23 | Disposition: A | Discharge status: Home / Self Care | Payer: Medicare Other | Attending: Urology | Admitting: Urology

## 2022-01-23 ENCOUNTER — Ambulatory Visit: Payer: Medicare Other

## 2022-01-23 DIAGNOSIS — K219 Gastro-esophageal reflux disease without esophagitis: Secondary | ICD-10-CM | POA: Insufficient documentation

## 2022-01-23 DIAGNOSIS — E782 Mixed hyperlipidemia: Secondary | ICD-10-CM | POA: Diagnosis not present

## 2022-01-23 DIAGNOSIS — Z87440 Personal history of urinary (tract) infections: Secondary | ICD-10-CM | POA: Diagnosis not present

## 2022-01-23 DIAGNOSIS — N1330 Unspecified hydronephrosis: Secondary | ICD-10-CM | POA: Diagnosis not present

## 2022-01-23 DIAGNOSIS — Z801 Family history of malignant neoplasm of trachea, bronchus and lung: Secondary | ICD-10-CM | POA: Diagnosis not present

## 2022-01-23 DIAGNOSIS — C662 Malignant neoplasm of left ureter: Secondary | ICD-10-CM | POA: Diagnosis not present

## 2022-01-23 DIAGNOSIS — R569 Unspecified convulsions: Secondary | ICD-10-CM | POA: Insufficient documentation

## 2022-01-23 DIAGNOSIS — E119 Type 2 diabetes mellitus without complications: Secondary | ICD-10-CM | POA: Insufficient documentation

## 2022-01-23 DIAGNOSIS — Z8551 Personal history of malignant neoplasm of bladder: Secondary | ICD-10-CM | POA: Diagnosis not present

## 2022-01-23 DIAGNOSIS — Z7984 Long term (current) use of oral hypoglycemic drugs: Secondary | ICD-10-CM | POA: Diagnosis not present

## 2022-01-23 DIAGNOSIS — Z8052 Family history of malignant neoplasm of bladder: Secondary | ICD-10-CM | POA: Diagnosis not present

## 2022-01-23 DIAGNOSIS — D4120 Neoplasm of uncertain behavior of unspecified ureter: Secondary | ICD-10-CM | POA: Diagnosis not present

## 2022-01-23 DIAGNOSIS — I10 Essential (primary) hypertension: Secondary | ICD-10-CM | POA: Diagnosis not present

## 2022-01-23 DIAGNOSIS — Z853 Personal history of malignant neoplasm of breast: Secondary | ICD-10-CM | POA: Insufficient documentation

## 2022-01-23 HISTORY — PX: BLADDER INSTILLATION: SHX6893

## 2022-01-23 HISTORY — PX: CYSTOSCOPY W/ URETERAL STENT PLACEMENT: SHX1429

## 2022-01-23 HISTORY — PX: CYSTOSCOPY W/ RETROGRADES: SHX1426

## 2022-01-23 HISTORY — PX: CYSTOSCOPY/URETEROSCOPY/HOLMIUM LASER: SHX6545

## 2022-01-23 LAB — GLUCOSE, CAPILLARY
Glucose-Capillary: 116 mg/dL — ABNORMAL HIGH (ref 70–99)
Glucose-Capillary: 115 mg/dL — ABNORMAL HIGH (ref 70–99)

## 2022-01-23 SURGERY — CYSTOURETEROSCOPY, USING HOLMIUM LASER
Anesthesia: General

## 2022-01-23 MED ORDER — FENTANYL CITRATE (PF) 100 MCG/2ML IJ SOLN
INTRAMUSCULAR | Status: AC
  Filled 2022-01-23: qty 2

## 2022-01-23 MED ORDER — SUGAMMADEX SODIUM 500 MG/5ML IV SOLN
INTRAVENOUS | Status: DC | PRN
  Administered 2022-01-23 (×2): 200 mg via INTRAVENOUS

## 2022-01-23 MED ORDER — PHENYLEPHRINE HCL (PRESSORS) 10 MG/ML IV SOLN
INTRAVENOUS | Status: DC | PRN
  Administered 2022-01-23: 80 ug via INTRAVENOUS

## 2022-01-23 MED ORDER — DEXAMETHASONE SODIUM PHOSPHATE 10 MG/ML IJ SOLN
INTRAMUSCULAR | Status: DC | PRN
  Administered 2022-01-23: 10 mg via INTRAVENOUS

## 2022-01-23 MED ORDER — OXYCODONE HCL 5 MG/5ML PO SOLN: 5.0000 mg | Freq: Once | ORAL | Status: AC | PRN

## 2022-01-23 MED ORDER — HYDROCODONE-ACETAMINOPHEN 5-325 MG PO TABS: 1.0000 | ORAL_TABLET | Freq: Four times a day (QID) | ORAL | 0 refills | Status: DC | PRN

## 2022-01-23 MED ORDER — PROPOFOL 10 MG/ML IV BOLUS
INTRAVENOUS | Status: DC | PRN
  Administered 2022-01-23: 150 mg via INTRAVENOUS

## 2022-01-23 MED ORDER — EPHEDRINE SULFATE (PRESSORS) 50 MG/ML IJ SOLN
INTRAMUSCULAR | Status: DC | PRN
  Administered 2022-01-23: 5 mg via INTRAVENOUS

## 2022-01-23 MED ORDER — ONDANSETRON HCL 4 MG/2ML IJ SOLN
INTRAMUSCULAR | Status: DC | PRN
  Administered 2022-01-23: 4 mg via INTRAVENOUS

## 2022-01-23 MED ORDER — ACETAMINOPHEN 10 MG/ML IV SOLN
INTRAVENOUS | Status: AC
  Filled 2022-01-23: qty 100

## 2022-01-23 MED ORDER — GEMCITABINE CHEMO FOR BLADDER INSTILLATION 2000 MG
INTRAVENOUS | Status: DC | PRN
  Administered 2022-01-23: 2000 mg via INTRAVESICAL

## 2022-01-23 MED ORDER — LIDOCAINE HCL (CARDIAC) PF 100 MG/5ML IV SOSY
PREFILLED_SYRINGE | INTRAVENOUS | Status: DC | PRN
  Administered 2022-01-23: 80 mg via INTRAVENOUS

## 2022-01-23 MED ORDER — IOHEXOL 180 MG/ML  SOLN
INTRAMUSCULAR | Status: DC | PRN
  Administered 2022-01-23: 15 mL

## 2022-01-23 MED ORDER — CEFAZOLIN SODIUM-DEXTROSE 2-4 GM/100ML-% IV SOLN
INTRAVENOUS | Status: AC
  Filled 2022-01-23: qty 100

## 2022-01-23 MED ORDER — OXYBUTYNIN CHLORIDE 5 MG PO TABS: 5.0000 mg | ORAL_TABLET | Freq: Three times a day (TID) | ORAL | 0 refills | Status: DC | PRN

## 2022-01-23 MED ORDER — OXYCODONE HCL 5 MG PO TABS
5.0000 mg | ORAL_TABLET | Freq: Once | ORAL | Status: AC | PRN
  Administered 2022-01-23: 5 mg via ORAL

## 2022-01-23 MED ORDER — ROCURONIUM BROMIDE 100 MG/10ML IV SOLN
INTRAVENOUS | Status: DC | PRN
  Administered 2022-01-23: 100 mg via INTRAVENOUS

## 2022-01-23 MED ORDER — KETOROLAC TROMETHAMINE 30 MG/ML IJ SOLN
INTRAMUSCULAR | Status: DC | PRN
  Administered 2022-01-23: 30 mg via INTRAVENOUS

## 2022-01-23 MED ORDER — FENTANYL CITRATE (PF) 100 MCG/2ML IJ SOLN
INTRAMUSCULAR | Status: DC | PRN
  Administered 2022-01-23 (×2): 25 ug via INTRAVENOUS
  Administered 2022-01-23: 50 ug via INTRAVENOUS

## 2022-01-23 MED ORDER — TAMSULOSIN HCL 0.4 MG PO CAPS: 0.4000 mg | ORAL_CAPSULE | Freq: Every day | ORAL | 0 refills | Status: DC

## 2022-01-23 MED ORDER — CHLORHEXIDINE GLUCONATE 0.12 % MT SOLN
OROMUCOSAL | Status: AC
  Administered 2022-01-23: 15 mL via OROMUCOSAL
  Filled 2022-01-23: qty 15

## 2022-01-23 MED ORDER — DEXMEDETOMIDINE HCL IN NACL 200 MCG/50ML IV SOLN
INTRAVENOUS | Status: DC | PRN
  Administered 2022-01-23: 4 ug via INTRAVENOUS

## 2022-01-23 MED ORDER — ACETAMINOPHEN 10 MG/ML IV SOLN
INTRAVENOUS | Status: DC | PRN
  Administered 2022-01-23: 1000 mg via INTRAVENOUS

## 2022-01-23 MED ORDER — OXYCODONE HCL 5 MG PO TABS
ORAL_TABLET | ORAL | Status: AC
  Filled 2022-01-23: qty 1

## 2022-01-23 MED ORDER — FENTANYL CITRATE (PF) 100 MCG/2ML IJ SOLN
25.0000 ug | INTRAMUSCULAR | Status: DC | PRN
  Administered 2022-01-23 (×2): 50 ug via INTRAVENOUS

## 2022-01-23 MED ORDER — LACTATED RINGERS IV SOLN: INTRAVENOUS | Status: DC | PRN

## 2022-01-23 MED ORDER — SODIUM CHLORIDE 0.9 % IR SOLN
Status: DC | PRN
  Administered 2022-01-23: 1000 mL

## 2022-01-23 MED ORDER — FAMOTIDINE 20 MG PO TABS
ORAL_TABLET | ORAL | Status: AC
  Administered 2022-01-23: 20 mg via ORAL
  Filled 2022-01-23: qty 1

## 2022-01-23 MED ORDER — CHLORHEXIDINE GLUCONATE 0.12 % MT SOLN: 15.0000 mL | Freq: Once | OROMUCOSAL | Status: AC

## 2022-01-23 MED ORDER — ORAL CARE MOUTH RINSE: 15.0000 mL | Freq: Once | OROMUCOSAL | Status: AC

## 2022-01-23 SURGICAL SUPPLY — 22 items
BAG DRAIN SIEMENS DORNER NS (MISCELLANEOUS) ×2 IMPLANT
BAG DRN LRG CPC RND TRDRP CNTR (MISCELLANEOUS) ×2
BAG DRN NS LF (MISCELLANEOUS) ×2
BAG URO DRAIN 4000ML (MISCELLANEOUS) IMPLANT
BRUSH SCRUB EZ 1% IODOPHOR (MISCELLANEOUS) ×2 IMPLANT
CATH URETL OPEN 5X70 (CATHETERS) ×2 IMPLANT
DRAPE UTILITY 15X26 TOWEL STRL (DRAPES) ×2 IMPLANT
FIBER LASER MOSES 365 DFL (Laser) IMPLANT
GAUZE 4X4 16PLY ~~LOC~~+RFID DBL (SPONGE) ×4 IMPLANT
GLOVE BIO SURGEON STRL SZ 6.5 (GLOVE) ×2 IMPLANT
GOWN STRL REUS W/ TWL LRG LVL3 (GOWN DISPOSABLE) ×4 IMPLANT
GOWN STRL REUS W/TWL LRG LVL3 (GOWN DISPOSABLE) ×4
GUIDEWIRE STR DUAL SENSOR (WIRE) ×2 IMPLANT
IV NS IRRIG 3000ML ARTHROMATIC (IV SOLUTION) ×2 IMPLANT
KIT TURNOVER CYSTO (KITS) ×2 IMPLANT
PACK CYSTO AR (MISCELLANEOUS) ×2 IMPLANT
SET CYSTO W/LG BORE CLAMP LF (SET/KITS/TRAYS/PACK) ×2 IMPLANT
STENT URO INLAY 6FRX24CM (STENTS) IMPLANT
SURGILUBE 2OZ TUBE FLIPTOP (MISCELLANEOUS) ×2 IMPLANT
TRAP FLUID SMOKE EVACUATOR (MISCELLANEOUS) ×2 IMPLANT
WATER STERILE IRR 1000ML POUR (IV SOLUTION) ×2 IMPLANT
WATER STERILE IRR 500ML POUR (IV SOLUTION) ×2 IMPLANT

## 2022-01-23 NOTE — Interval H&P Note (Signed)
History and Physical Interval Note:  01/23/2022 1:19 PM  Elizabeth Trujillo  has presented today for surgery, with the diagnosis of Left Distal Ureteral Tumor.  The various methods of treatment have been discussed with the patient and family. After consideration of risks, benefits and other options for treatment, the patient has consented to  Procedure(s): CYSTOSCOPY/URETEROSCOPY/HOLMIUM LASER ABLATION OF URETERAL TUMOR (Left) CYSTOSCOPY WITH STENT EXCHANGE (Left) CYSTOSCOPY WITH RETROGRADE PYELOGRAM (Left) BLADDER INSTILLATION OF GEMCITABINE (N/A) as a surgical intervention.  The patient's history has been reviewed, patient examined, no change in status, stable for surgery.  I have reviewed the patient's chart and labs.  Questions were answered to the patient's satisfaction.    RRR CTAB   Hollice Espy

## 2022-01-23 NOTE — Progress Notes (Signed)
Foley catheter removed per order.

## 2022-01-23 NOTE — Anesthesia Procedure Notes (Signed)
Procedure Name: Intubation Date/Time: 01/23/2022 1:49 PM  Performed by: Donnie Mesa, RNPre-anesthesia Checklist: Patient identified, Patient being monitored, Timeout performed, Emergency Drugs available and Suction available Patient Re-evaluated:Patient Re-evaluated prior to induction Oxygen Delivery Method: Circle system utilized Preoxygenation: Pre-oxygenation with 100% oxygen Induction Type: IV induction Ventilation: Mask ventilation without difficulty Laryngoscope Size: 3 and McGraph Grade View: Grade I Tube type: Oral Tube size: 6.5 mm Number of attempts: 1 Airway Equipment and Method: Stylet Placement Confirmation: ETT inserted through vocal cords under direct vision, positive ETCO2 and breath sounds checked- equal and bilateral Secured at: 21 cm Tube secured with: Tape Dental Injury: Teeth and Oropharynx as per pre-operative assessment

## 2022-01-23 NOTE — Progress Notes (Signed)
Gemcitabine released at 1522 per order.

## 2022-01-23 NOTE — Discharge Instructions (Addendum)
You have a ureteral stent in place.  This is a tube that extends from your kidney to your bladder.  This may cause urinary bleeding, burning with urination, and urinary frequency.  Please call our office or present to the ED if you develop fevers >101 or pain which is not able to be controlled with oral pain medications.  You may be given either Flomax and/ or ditropan to help with bladder spasms and stent pain in addition to pain medications.    [facility] [address] [place], [place] [address] [phone]   AMBULATORY SURGERY  DISCHARGE INSTRUCTIONS   The drugs that you were given will stay in your system until tomorrow so for the next 24 hours you should not:  Drive an automobile Make any legal decisions Drink any alcoholic beverage   You may resume regular meals tomorrow.  Today it is better to start with liquids and gradually work up to solid foods.  You may eat anything you prefer, but it is better to start with liquids, then soup and crackers, and gradually work up to solid foods.   Please notify your doctor immediately if you have any unusual bleeding, trouble breathing, redness and pain at the surgery site, drainage, fever, or pain not relieved by medication.    Additional Instructions:        Please contact your physician with any problems or Same Day Surgery at [phone], Monday through Friday 6 am to 4 pm, or [facility] at [facility] number at [phone].\[phone]\

## 2022-01-23 NOTE — Anesthesia Postprocedure Evaluation (Signed)
Anesthesia Post Note  [name]  Procedure(s) Performed: CYSTOSCOPY/URETEROSCOPY/HOLMIUM LASER ABLATION OF URETERAL TUMOR (Left) CYSTOSCOPY WITH STENT EXCHANGE (Left) CYSTOSCOPY WITH RETROGRADE PYELOGRAM (Left) BLADDER INSTILLATION OF GEMCITABINE  Patient location during evaluation: PACU Anesthesia Type: General Level of consciousness: awake and alert Pain management: pain level controlled Vital Signs Assessment: post-procedure vital signs reviewed and stable Respiratory status: spontaneous breathing, nonlabored ventilation, respiratory function stable and patient connected to nasal cannula oxygen Cardiovascular status: blood pressure returned to baseline and stable Postop Assessment: no apparent nausea or vomiting Anesthetic complications: no   No notable events documented.   Last Vitals:  Vitals:   01/23/22 1445 01/23/22 1450  BP: (!) 155/64 136/64  Pulse: (!) 58 (!) 59  Resp: 14 19  Temp: (!) 36.3 C (!) 36.2 C  SpO2: 99% 100%    Last Pain:  Vitals:   01/23/22 1450  TempSrc:   PainSc: Asleep                 Ilene Qua

## 2022-01-23 NOTE — Transfer of Care (Signed)
Immediate Anesthesia Transfer of Care Note  [name]  Procedure(s) Performed: Procedure(s): CYSTOSCOPY/URETEROSCOPY/HOLMIUM LASER ABLATION OF URETERAL TUMOR (Left) CYSTOSCOPY WITH STENT EXCHANGE (Left) CYSTOSCOPY WITH RETROGRADE PYELOGRAM (Left) BLADDER INSTILLATION OF GEMCITABINE (N/A)  Patient Location: PACU  Anesthesia Type:General  Level of Consciousness: sedated  Airway & Oxygen Therapy: Patient Spontanous Breathing and Patient connected to face mask oxygen  Post-op Assessment: Report given to RN and Post -op Vital signs reviewed and stable  Post vital signs: Reviewed and stable  Last Vitals:  Vitals:   01/23/22 1134 01/23/22 1445  BP: (!) 144/74 (!) 155/64  Pulse: (!) 58 (!) 58  Resp: 18 14  Temp: 37.2 C   SpO2: 49% 44%    Complications: No apparent anesthesia complications

## 2022-01-23 NOTE — Anesthesia Preprocedure Evaluation (Signed)
Anesthesia Evaluation  Patient identified by MRN, date of birth, ID band Patient awake    Reviewed: Allergy & Precautions, NPO status , Patient's Chart, lab work & pertinent test results  History of Anesthesia Complications Negative for: history of anesthetic complications  Airway Mallampati: III  TM Distance: <3 FB Neck ROM: full    Dental  (+) Chipped   Pulmonary neg pulmonary ROS, neg shortness of breath   Pulmonary exam normal        Cardiovascular Exercise Tolerance: Good hypertension, (-) angina Normal cardiovascular exam     Neuro/Psych Seizures -,  TIACVA, Residual Symptoms  negative psych ROS   GI/Hepatic Neg liver ROS,GERD  Controlled,,  Endo/Other  negative endocrine ROSdiabetes    Renal/GU      Musculoskeletal   Abdominal   Peds  Hematology negative hematology ROS (+)   Anesthesia Other Findings Past Medical History: No date: Anemia No date: Arthritis 1986: Breast cancer ([place])     Comment:  left mastectomy 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     Comment:  BCG tx per pt No date: Collagen vascular disease (HCC)     Comment:  RA  No date: Diabetes mellitus without complication (HCC) No date: Elevated triglycerides with high cholesterol No date: Family history of bladder cancer No date: Family history of breast cancer No date: Family history of lung cancer No date: GERD (gastroesophageal reflux disease)     Comment:  history of reflux No date: History of cancer of ureter No date: Hypercholesterolemia No date: Hypertension     Comment:  history of  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, left ([place])     Comment:  Surgical resection and BCG  tx per pt No date: Seizure ([place])     Comment:  last seizure was early 2023 No date: Stroke [facility])     Comment:  TIA-possible seizure instead of tia per neurologist No 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[place])     Comment:  Left Ureter No date: UTI  (urinary tract infection)  Past Surgical History: 09/1984: ABDOMINAL HYSTERECTOMY     Comment:  TAH/BSO 11/11/2018: BIOPSY; Left     Comment:  Procedure: BIOPSY;  Surgeon: Hollice Espy, MD;                Location: [facility] ORS;  Service: Urology;  Laterality: Left; 01/23/2013: BREAST BIOPSY; Right     Comment:  [place] bx/clip-neg 07/1984: BREAST SURGERY; Left     Comment:  Mastectomy No date: callus removed from left toe 2015: CATARACT EXTRACTION W/ INTRAOCULAR LENS IMPLANT; Left No date: COLONOSCOPY 02/24/2019: CYSTOSCOPY W/ RETROGRADES; Left     Comment:  Procedure: CYSTOSCOPY WITH RETROGRADE PYELOGRAM;                Surgeon: Hollice Espy, MD;  Location: [facility] ORS;                Service: Urology;  Laterality: Left; 02/24/2019: CYSTOSCOPY WITH BIOPSY; N/A     Comment:  Procedure: CYSTOSCOPY WITH Bladder BIOPSY;  Surgeon:               Hollice Espy, MD;  Location: [facility] ORS;  Service:               Urology;  Laterality: N/A; 02/24/2019: CYSTOSCOPY WITH FULGERATION; N/A     Comment:  Procedure: CYSTOSCOPY WITH FULGERATION;  Surgeon:               Hollice Espy, MD;  Location: [facility] ORS;  Service:  Urology;  Laterality: N/A; 11/24/2019: CYSTOSCOPY WITH RETROGRADE PYELOGRAM, URETEROSCOPY AND  STENT PLACEMENT; Left     Comment:  Procedure: CYSTOSCOPY WITH RETROGRADE PYELOGRAM,               URETEROSCOPY;  Surgeon: Hollice Espy, MD;  Location:               [facility] ORS;  Service: Urology;  Laterality: Left; 10/14/2018: CYSTOSCOPY WITH URETEROSCOPY AND STENT PLACEMENT; Left     Comment:  Procedure: CYSTOSCOPY WITH URETEROSCOPY AND STENT               PLACEMENT;  Surgeon: Hollice Espy, MD;  Location: [facility]              ORS;  Service: Urology;  Laterality: Left; 02/24/2019: CYSTOSCOPY WITH URETEROSCOPY AND STENT PLACEMENT; Left     Comment:  Procedure: CYSTOSCOPY WITH URETEROSCOPY AND STENT               Exchange;  Surgeon: Hollice Espy, MD;  Location: [facility]                ORS;  Service: Urology;  Laterality: Left; 11/11/2018: CYSTOSCOPY/URETEROSCOPY/HOLMIUM LASER/STENT PLACEMENT;  Left     Comment:  Procedure: CYSTOSCOPY/URETEROSCOPY//STENT EXCHANGE;                Surgeon: Hollice Espy, MD;  Location: [facility] ORS;                Service: Urology;  Laterality: Left; 2013: EYE SURGERY; Left 04/2013: FOOT SURGERY; Left 10/14/2018: HOLMIUM LASER APPLICATION; Left     Comment:  Procedure: HOLMIUM LASER APPLICATION;  Surgeon: Hollice Espy, MD;  Location: [facility] ORS;  Service: Urology;                Laterality: Left; No date: JOINT REPLACEMENT 11/03/2015: KNEE ARTHROPLASTY; Right     Comment:  Procedure: COMPUTER ASSISTED TOTAL KNEE ARTHROPLASTY;                Surgeon: Dereck Leep, MD;  Location: [facility] OR[facility];                Service: Orthopedics;  Laterality: Right; 1986: MASTECTOMY; Left     Comment:  positive 03/23/2021: TOTAL KNEE ARTHROPLASTY; Left     Comment:  Procedure: TOTAL KNEE ARTHROPLASTY;  Surgeon: Lovell Sheehan, MD;  Location: [facility] OR[facility];  Service: Orthopedics;               Laterality: Left; 10/14/2018: URETERAL BIOPSY; Left     Comment:  Procedure: URETERAL BIOPSY;  Surgeon: Hollice Espy,               MD;  Location: [facility] OR[facility];  Service: Urology;  Laterality:               Left; 02/24/2019: URETERAL BIOPSY; Left     Comment:  Procedure: URETERAL BIOPSY;  Surgeon: Hollice Espy,               MD;  Location: [facility] ORS;  Service: Urology;  Laterality:               Left;     Reproductive/Obstetrics negative OB ROS  Anesthesia Physical Anesthesia Plan  ASA: 3  Anesthesia Plan: General ETT   Post-op Pain Management: Minimal or no pain anticipated   Induction: Intravenous  PONV Risk Score and Plan: Ondansetron, Dexamethasone and Treatment may vary due to age or medical condition  Airway Management Planned: Oral ETT  Additional Equipment:    Intra-op Plan:   Post-operative Plan: Extubation in OR  Informed Consent: I have reviewed the patients History and Physical, chart, labs and discussed the procedure including the risks, benefits and alternatives for the proposed anesthesia with the patient or authorized representative who has indicated his/her understanding and acceptance.     Dental Advisory Given  Plan Discussed with: Anesthesiologist, CRNA and Surgeon  Anesthesia Plan Comments: (Patient consented for risks of anesthesia including but not limited to:  - adverse reactions to medications - damage to eyes, teeth, lips or other oral mucosa - nerve damage due to positioning  - sore throat or hoarseness - Damage to heart, brain, nerves, lungs, other parts of body or loss of life  Patient voiced understanding.)        Anesthesia Quick Evaluation

## 2022-01-23 NOTE — Op Note (Signed)
Date of procedure: 01/23/22  Preoperative diagnosis:  Left upper tract urothelial carcinoma, left distal ureter Left hydronephrosis  Postoperative diagnosis:  Same as above  Procedure: Left diagnostic ureteroscopy Laser ablation of left distal ureteral tumor Left retrograde pyelogram Left ureteral exchange  \instillation of intravesical BCG Interpretation of fluoroscopy less than 30 minutes  Surgeon: Hollice Espy, MD  Anesthesia: General  Complications: None  Intraoperative findings: Narrow erythematous area in the left distal ureter, approximately 1 cm in length at previous biopsy site.  Proximal to this, there is dilation normal ureter.  No obvious papillary tumor.  Surface of this area of the ureter was ablated using ablation settings of 1 J and 15 Hz.  No contrast extravasation.  Remainder of the upper tract collecting system normal including normal pyeloscopy.  Stent replaced and intravesical chemotherapy instilled.  EBL: Minimal  Specimens: None  Drains: 6 x 24 French J ureteral stent, Bard Optima on left  Indication: Elizabeth Trujillo is a 86 y.o. patient with biopsy-proven left distal ureteral high-grade presumably noninvasive urothelial carcinoma.  After reviewing the management options for treatment, she elected to proceed with the above surgical procedure(s). We have discussed the potential benefits and risks of the procedure, side effects of the proposed treatment, the likelihood of the patient achieving the goals of the procedure, and any potential problems that might occur during the procedure or recuperation. Informed consent has been obtained.  Description of procedure:  The patient was taken to the operating room and general anesthesia was induced.  The patient was placed in the dorsal lithotomy position, prepped and draped in the usual sterile fashion, and preoperative antibiotics were administered. A preoperative time-out was performed.   21 [place] scope was  advanced per urethra into the bladder.  Attention was turned to the left side where a Bard Optima ureteral stent was seen emanating.  The distal coil was grasped and brought to level the urethral meatus.  Using fluoroscopy, the stent was cannulated up to the level of the kidney with a sensor wire and the stent was removed.  A semirigid ureteroscope was then advanced several centimeters within the ureter where the area of abnormality at the previous biopsy site was encountered.  It was more narrow and erythematous than adjacent areas however there is no obvious papillary tumor as was also appreciated at the time of previous ureteral biopsies.  Proximal to this, there was ureteral dilation with normal urothelium.  A 275 m laser fiber was then brought in and using ablative settings of 1 J and 15 Hz, the area of interest of the previous biopsy site was fulgurated including all mucosal surface.  Any other area of erythema or irregularity was also fulgurated.  There was minimal bleeding noted.  Retrograde pyelogram through the scope indicated no contrast extravasation with narrowing at this area dilation proximal to this.  Next, a Super Stiff wire was advanced through the scope up to the level of the kidney.  I then removed the semirigid ureteroscope and over the Super Stiff wire, advanced flexible digital single-channel Wolf ureteroscope up to the level of the kidney.  Formal pyeloscopy was performed in each and every calyx was directly visualized and inspected.  There is no upper tract tumors appreciated.  Scope sent back to length the ureter inspecting along the way.  Other than the aforementioned area of abnormality in the distal ureter, there is no other ureteral tumors, lesions, or suspicious areas appreciated.  Finally, using a sensor wire, a 6 x  70 French double-J ureteral stent was advanced over this wire up to the level of the kidney.  Upon removing the wire, full coil was noted both within the renal pelvis as  well as within the bladder.  The bladder was then drained.  A 20 French Foley catheter was then placed and the bladder was drained after the balloon was inflated with 30 cc of sterile water.  Patient was then cleaned and dried, repositioned in supine position, reversed from anesthesia and taken to PACU in stable condition.  2000 mg of intravesical gemcitabine was instilled to the bladder and allowed to dwell for 1 hour.  At the end of 1 hour, the bladder was drained and the catheter was removed.  This was well-tolerated.  Plan: As per previous discussion, we will start another course of induction BCG in 4 to 6 weeks with the stent in place.  We will likely plan for second look/diagnostic ureteroscopy several months after this is complete.  She is agreeable with this plan.  All questions were answered.  Hollice Espy, M.D.

## 2022-01-24 ENCOUNTER — Encounter: Payer: Self-pay | Admitting: Urology

## 2022-02-17 ENCOUNTER — Telehealth: Payer: Self-pay

## 2022-02-17 MED ORDER — TAMSULOSIN HCL 0.4 MG PO CAPS: 0.4000 mg | ORAL_CAPSULE | Freq: Every day | ORAL | 3 refills | Status: DC

## 2022-02-17 NOTE — Telephone Encounter (Signed)
Spoke with pt. We will send in Flomax in case she needs it. Sent to [name]. Patient also with questions regarding BCG starting sent info to [facility] and she will call to schedule.

## 2022-02-17 NOTE — Telephone Encounter (Signed)
Pt scheduled for 6 induction BCG appts. BCG pt education mailed to pt's house. Pt confirmed appts.

## 2022-02-17 NOTE — Telephone Encounter (Signed)
Patient states that she is completely out of Flomax, she would like to know if she should continue and if so could she have some sent in.

## 2022-02-17 NOTE — Telephone Encounter (Signed)
-----   Message from Hollice Espy, MD sent at 01/23/2022  2:38 PM EST ----- Regarding: BCG This patient will need to begin induction course of BCG x6 starting in 4 to 6 weeks.  She has a stent in place.  She prefers [place] as a location.  After she is completed the course, I like to see her in the office to discuss diagnostic ureteroscopy. Hollice Espy, MD

## 2022-03-07 ENCOUNTER — Ambulatory Visit (INDEPENDENT_AMBULATORY_CARE_PROVIDER_SITE_OTHER): Payer: Medicare Other | Admitting: Physician Assistant

## 2022-03-07 DIAGNOSIS — C679 Malignant neoplasm of bladder, unspecified: Secondary | ICD-10-CM | POA: Diagnosis not present

## 2022-03-07 DIAGNOSIS — C662 Malignant neoplasm of left ureter: Secondary | ICD-10-CM

## 2022-03-07 LAB — URINALYSIS, COMPLETE
Bilirubin, UA: NEGATIVE
Glucose, UA: NEGATIVE
Ketones, UA: NEGATIVE
Nitrite, UA: NEGATIVE
Protein,UA: NEGATIVE
Specific Gravity, UA: 1.01 (ref 1.005–1.030)
Urobilinogen, Ur: 0.2 mg/dL (ref 0.2–1.0)
pH, UA: 5.5 (ref 5.0–7.5)

## 2022-03-07 LAB — MICROSCOPIC EXAMINATION

## 2022-03-07 MED ORDER — OXYBUTYNIN CHLORIDE 5 MG PO TABS: 5.0000 mg | ORAL_TABLET | Freq: Three times a day (TID) | ORAL | 2 refills | Status: DC | PRN

## 2022-03-07 MED ORDER — BCG LIVE 50 MG IS SUSR
3.2400 mL | Freq: Once | INTRAVESICAL | Status: AC
  Administered 2022-03-07: 81 mg via INTRAVESICAL

## 2022-03-07 NOTE — Progress Notes (Signed)
BCG Bladder Instillation  BCG # 1 of 6  Due to Bladder Cancer patient is present today for a BCG treatment. Patient was cleaned and prepped in a sterile fashion with betadine. A 14FR catheter was inserted, urine return was noted 78m, urine was yellow in color.  512mof reconstituted BCG was instilled into the bladder. The catheter was then removed. Patient tolerated well, no complications were noted  Performed by: SaDebroah LoopPA-C and JeGaspar ColaCMA  Additional notes: We reviewed post-instillation instructions including holding the urine for 2 hours with quarter turns every 15 minutes and pouring bleach into the toilet with subsequent voids for 6 hours. Written instructions also provided today. She expressed understanding.   We discussed continuing Flomax and resuming oxybutynin to manage stent symptoms, urinary urgency/urge incontinence.  Follow up: 1 week for BCG #2 of 6

## 2022-03-07 NOTE — Patient Instructions (Signed)
Your Timeline for Today:  Right now through 1:55pm: Hold your urine and do your quarter turns every 15 minutes. 1:55pm-7:55pm today: Every time you urinate, pour 1/2 cup of bleach into the toilet and let it sit for 15 minutes prior to flushing. 7:55pm onward: Resume your normal routine.   Patient Education: (BCG) Into the Bladder (Intravesical Chemotherapy)  BCG is a vaccine which is used to prevent tuberculosis (TB).  But it's also a helpful treatment for some early bladder cancers.  When BCG goes directly into the bladder the treatment is described as intravesical.  BCG is a type of immunotherapy.  Immunotherapy stimulates the body's immune system to destroy cancer cells.  How it's given BCG treatment is given to you in an outpatient setting.  It takes a few minutes to administer and you can go home as soon as it's finished.  It might be a good idea to ask someone to bring you, particularly the fist time.  Unlike chemotherapy into the bladder, BCG treatment is never given immediately after surgery to remove bladder tumors.  There needs to be a delay usually of at least two weeks after surgery, before you can have it.  You won't be given treatment with BCG if you are unwell or have an infection in your urine.  You're usually asked to limit the amount you drink before your treatment.  This will help to increase the concentration of BCG in your bladder.  Drinking too much before your treatment may make your bladder feel uncomfortably full.  If you normally take water tablets (diuretics) take them later in the day after your treatment.  Your nurse or doctor will give you more advise about preparing for your treatment.  You will have a small tube (catheter) placed into your bladder.  Your doctor will then put the liquid vaccine directly into your bladder through the catheter and remove the catheter.  You will need to hold your urine for two hours afterwards.  Rotating every 15 minutes from side to  side. This can be difficult but it's to give the treatment time to work.  When the treatment is over you can go to the toilet.  After your treatment there are some precautions you'll need to take.  This is because BCG is a live vaccine and other people shouldn't be exposed to it.  For the next six hours, you'll need to avoid your urine splashing on the toilet seat and getting any urine on your hands.  It might be easer for men to sit down when they're using an ordinary toilet although using a stand up urinal should be alright.  The main this is to avoid splashing urine and spreading the vaccine.  You will also be asked to put 1/2 cup undiluted bleach into the toilet to destroy any live vaccine and leave it for 15 minutes until you flush for the next 6 voids.  Side Effects Because BCG goes directly into the bladder most of the side effects are linked with the bladder.  They usually go away within one to two days after your treatment.  The most common ones are: -needing to pass urine often -pain when you pass urine -blood in urine -flu-like symptoms (tiredness, general aching and raised temperature)  Theses side effects should settle down within a day or two.  If they don't get better contact your doctor.  Drinking lots of fluids can help flush the drug out of your bladder and reduce some of these effects.  Taking Ibuprofen or Aleve is encouraged unless you have a condition that would make these medications unsafe to take (renal failure, diabetes, gerd)  Rare side effects can include a continuing high temperature (fever), pain in your joints and a cough.  If you have any of these symptoms, or if you feel generally unwell, contact your doctor.  These symptoms could be a sign of a more serious infection (due to BCG) that needs to be treated immediately.  If this happens you'll be treated with the same drugs (antibiotics) that are used to treat TB.  Contraception Men should use a condom during sex for  the first 48 hours after their treatment.  If you are a women who has had BCG treatment then your partner should use a condom.  Using a condom will protect your partner from any vaccine present in your semen or vaginal fluid.  We don't know how BCG may affect a developing fetus so it's not advisable to become pregnant or father a child while having it.  It is important to use effective contraception during your treatment and for six weeks afterwards.  You can discuss this with your doctor or specialist nurse.

## 2022-03-14 ENCOUNTER — Encounter: Payer: Self-pay | Admitting: Physician Assistant

## 2022-03-14 ENCOUNTER — Ambulatory Visit (INDEPENDENT_AMBULATORY_CARE_PROVIDER_SITE_OTHER): Payer: Medicare Other | Admitting: Physician Assistant

## 2022-03-14 VITALS — Ht 62.0 in | Wt 203.0 lb

## 2022-03-14 DIAGNOSIS — C679 Malignant neoplasm of bladder, unspecified: Secondary | ICD-10-CM | POA: Diagnosis not present

## 2022-03-14 LAB — URINALYSIS, COMPLETE
Bilirubin, UA: NEGATIVE
Glucose, UA: NEGATIVE
Ketones, UA: NEGATIVE
Nitrite, UA: NEGATIVE
Specific Gravity, UA: 1.015 (ref 1.005–1.030)
Urobilinogen, Ur: 0.2 mg/dL (ref 0.2–1.0)
pH, UA: 6 (ref 5.0–7.5)

## 2022-03-14 LAB — MICROSCOPIC EXAMINATION: RBC, Urine: >30 /hpf — AB (ref 0–2)

## 2022-03-14 MED ORDER — BCG LIVE 50 MG IS SUSR
3.2400 mL | Freq: Once | INTRAVESICAL | Status: AC
  Administered 2022-03-14: 81 mg via INTRAVESICAL

## 2022-03-14 NOTE — Progress Notes (Signed)
BCG Bladder Instillation  BCG # 2 of 6  Due to Bladder Cancer patient is present today for a BCG treatment. Patient was cleaned and prepped in a sterile fashion with betadine. A 14FR catheter was inserted, urine return was noted 24m, urine was yellow in color.  569mof reconstituted BCG was instilled into the bladder. The catheter was then removed. Patient tolerated well, no complications were noted  Performed by: SaDebroah LoopPA-C and DePort MatildaCMA  Follow up/ Additional notes: 1 week for BCG #3 of 6

## 2022-03-14 NOTE — Progress Notes (Deleted)
Patient ID: Elizabeth Trujillo, female   DOB: 09-Jun-1935, 87 y.o.   MRN: [id number] BCG Bladder Instillation  BCG # ***  Due to Bladder Cancer patient is present today for a BCG treatment. Patient was cleaned and prepped in a sterile fashion with betadine. A 14FR catheter was inserted, urine return was noted ***ml, urine was *** in color.  52m of reconstituted BCG was instilled into the bladder. The catheter was then removed. Patient tolerated well, {dnt complications:20057}  Performed by: ***  Follow up/ Additional notes: ***

## 2022-03-16 ENCOUNTER — Telehealth: Payer: Self-pay | Admitting: Family Medicine

## 2022-03-16 DIAGNOSIS — E119 Type 2 diabetes mellitus without complications: Secondary | ICD-10-CM

## 2022-03-16 MED ORDER — METFORMIN HCL 500 MG PO TABS: 500.0000 mg | ORAL_TABLET | Freq: Every day | ORAL | 0 refills | Status: DC

## 2022-03-16 NOTE — Telephone Encounter (Signed)
[facility] faxed refill request for the following medications:   metFORMIN (GLUCOPHAGE) 500 MG tablet    Please advise

## 2022-03-17 ENCOUNTER — Other Ambulatory Visit: Payer: Self-pay

## 2022-03-17 DIAGNOSIS — C679 Malignant neoplasm of bladder, unspecified: Secondary | ICD-10-CM

## 2022-03-17 DIAGNOSIS — N390 Urinary tract infection, site not specified: Secondary | ICD-10-CM

## 2022-03-17 NOTE — Progress Notes (Unsigned)
BCG Bladder Instillation  BCG # 3/6  Due to Bladder Cancer patient is present today for a BCG treatment. Patient was cleaned and prepped in a sterile fashion with betadine. A 16 FR catheter was inserted, urine return was noted 100 ml, urine was yellow in color.  90m of reconstituted BCG was instilled into the bladder. The catheter was then removed. Patient tolerated well, no complications were noted  Performed by: SZara Council PA-C and ODarrick Grinder CMA  Follow up/ Additional notes: 1 week for BCG No. 4 out of 6

## 2022-03-17 NOTE — Telephone Encounter (Signed)
LH will take BCG to [facility].   CM

## 2022-03-20 ENCOUNTER — Other Ambulatory Visit
Admission: RE | Admit: 2022-03-20 | Discharge: 2022-03-20 | Disposition: A | Discharge status: Home / Self Care | Payer: Medicare Other | Source: Ambulatory Visit | Attending: Urology | Admitting: Urology

## 2022-03-20 ENCOUNTER — Ambulatory Visit (INDEPENDENT_AMBULATORY_CARE_PROVIDER_SITE_OTHER): Payer: Medicare Other | Admitting: Urology

## 2022-03-20 VITALS — BP 123/69

## 2022-03-20 DIAGNOSIS — C679 Malignant neoplasm of bladder, unspecified: Secondary | ICD-10-CM | POA: Diagnosis not present

## 2022-03-20 DIAGNOSIS — N390 Urinary tract infection, site not specified: Secondary | ICD-10-CM

## 2022-03-20 DIAGNOSIS — D090 Carcinoma in situ of bladder: Secondary | ICD-10-CM

## 2022-03-20 LAB — URINALYSIS, COMPLETE (UACMP) WITH MICROSCOPIC
Bilirubin Urine: NEGATIVE
Glucose, UA: NEGATIVE mg/dL
Ketones, ur: NEGATIVE mg/dL
Nitrite: NEGATIVE
Protein, ur: 30 mg/dL — AB
RBC / HPF: >50 RBC/hpf (ref 0–5)
Specific Gravity, Urine: 1.02 (ref 1.005–1.030)
pH: 6 (ref 5.0–8.0)

## 2022-03-20 MED ORDER — BCG LIVE 50 MG IS SUSR
3.2400 mL | Freq: Once | INTRAVESICAL | Status: AC
  Administered 2022-03-20: 81 mg via INTRAVESICAL

## 2022-03-24 NOTE — Progress Notes (Signed)
Elizabeth Trujillo experienced lower abdominal for a few hours after her BCG treatment last week.  Patient denies any modifying or aggravating factors.  Patient denies any gross hematuria, dysuria or flank pain.  Patient denies any fevers, chills, nausea or vomiting.    Her UA: Component     Latest Ref Rng 03/27/2022  Color, Urine     YELLOW  YELLOW   Appearance     CLEAR  CLOUDY !   Specific Gravity, Urine     1.005 - 1.030  1.020   pH     5.0 - 8.0  5.5   Glucose, UA     NEGATIVE mg/dL NEGATIVE   Hgb urine dipstick     NEGATIVE  LARGE !   Bilirubin Urine     NEGATIVE  NEGATIVE   Ketones, ur     NEGATIVE mg/dL NEGATIVE   Protein     NEGATIVE mg/dL 100 !   Nitrite     NEGATIVE  NEGATIVE   Squamous Epithelial / HPF     0 - 5 /HPF 0-5   WBC, UA     0 - 5 WBC/hpf 11-20   RBC / HPF     0 - 5 RBC/hpf >50   Bacteria, UA     NONE SEEN  FEW !   Mucus PRESENT   Leukocytes,Ua     NEGATIVE  SMALL !     Legend: ! Abnormal  Her daughter, [name] and I discussed the urinalysis results specifically in regards to the increase in the [facility].  I explained that this may be due to the inflammatory response of the BCG, but it may also be the sign of an impending infection.  We decided to not proceed with the BCG treatment today.  Urine is sent for culture.    [name] is also wanting to know if we could try to give a specimen in a cup again vs cathing.  I stated we could try cleaning her perineum in the office, collecting the specimen in the hat and if it needs to go to culture, cath for a specimen.   I spent 15 minutes on the day of the encounter to include pre-visit record review, face-to-face time with the patient, and post-visit ordering of tests.

## 2022-03-27 ENCOUNTER — Ambulatory Visit (INDEPENDENT_AMBULATORY_CARE_PROVIDER_SITE_OTHER): Payer: Medicare Other | Admitting: Urology

## 2022-03-27 ENCOUNTER — Other Ambulatory Visit
Admission: RE | Admit: 2022-03-27 | Discharge: 2022-03-27 | Disposition: A | Discharge status: Home / Self Care | Payer: Medicare Other | Source: Ambulatory Visit | Attending: Urology | Admitting: Urology

## 2022-03-27 ENCOUNTER — Encounter: Payer: Self-pay | Admitting: Urology

## 2022-03-27 ENCOUNTER — Other Ambulatory Visit: Payer: Self-pay

## 2022-03-27 VITALS — BP 103/56 | HR 76

## 2022-03-27 DIAGNOSIS — N390 Urinary tract infection, site not specified: Secondary | ICD-10-CM

## 2022-03-27 DIAGNOSIS — Z87440 Personal history of urinary (tract) infections: Secondary | ICD-10-CM | POA: Diagnosis not present

## 2022-03-27 DIAGNOSIS — R1030 Lower abdominal pain, unspecified: Secondary | ICD-10-CM | POA: Diagnosis not present

## 2022-03-27 DIAGNOSIS — C679 Malignant neoplasm of bladder, unspecified: Secondary | ICD-10-CM | POA: Diagnosis not present

## 2022-03-27 DIAGNOSIS — R82998 Other abnormal findings in urine: Secondary | ICD-10-CM | POA: Diagnosis not present

## 2022-03-27 LAB — URINALYSIS, COMPLETE (UACMP) WITH MICROSCOPIC
Bilirubin Urine: NEGATIVE
Glucose, UA: NEGATIVE mg/dL
Ketones, ur: NEGATIVE mg/dL
Nitrite: NEGATIVE
Protein, ur: 100 mg/dL — AB
RBC / HPF: >50 RBC/hpf (ref 0–5)
Specific Gravity, Urine: 1.02 (ref 1.005–1.030)
pH: 5.5 (ref 5.0–8.0)

## 2022-03-28 ENCOUNTER — Other Ambulatory Visit: Payer: Self-pay | Admitting: Physician Assistant

## 2022-03-28 DIAGNOSIS — E7800 Pure hypercholesterolemia, unspecified: Secondary | ICD-10-CM

## 2022-03-28 LAB — URINE CULTURE: Culture: NO GROWTH

## 2022-03-29 ENCOUNTER — Telehealth: Payer: Self-pay | Admitting: *Deleted

## 2022-03-29 NOTE — Telephone Encounter (Signed)
Notified patient as instructed, patient pleased. Discussed follow-up appointments, patient agrees  

## 2022-03-29 NOTE — Telephone Encounter (Signed)
-----  Message from Nori Riis, PA-C sent at 03/29/2022  8:20 AM EST ----- Would you let Mrs. Pua know that her urine culture was negative for infection, so she does not need an antibiotic.  We will see her on Monday for her BCG.

## 2022-03-31 DIAGNOSIS — R200 Anesthesia of skin: Secondary | ICD-10-CM | POA: Diagnosis not present

## 2022-03-31 DIAGNOSIS — R202 Paresthesia of skin: Secondary | ICD-10-CM | POA: Diagnosis not present

## 2022-03-31 DIAGNOSIS — G3184 Mild cognitive impairment, so stated: Secondary | ICD-10-CM | POA: Diagnosis not present

## 2022-03-31 DIAGNOSIS — Z79899 Other long term (current) drug therapy: Secondary | ICD-10-CM | POA: Diagnosis not present

## 2022-03-31 DIAGNOSIS — R9082 White matter disease, unspecified: Secondary | ICD-10-CM | POA: Diagnosis not present

## 2022-03-31 DIAGNOSIS — E119 Type 2 diabetes mellitus without complications: Secondary | ICD-10-CM | POA: Diagnosis not present

## 2022-03-31 DIAGNOSIS — R569 Unspecified convulsions: Secondary | ICD-10-CM | POA: Diagnosis not present

## 2022-04-01 NOTE — Progress Notes (Signed)
BCG Bladder Instillation  BCG # 4/6  Due to Bladder Cancer patient is present today for a BCG treatment. Patient was cleaned and prepped in a sterile fashion with betadine. A 14 FR catheter was inserted, urine return was noted 40 ml, urine was yellow cloudy in color.  53m of reconstituted BCG was instilled into the bladder. The catheter was then removed. Patient tolerated well, no complications were noted  Performed by: SZara Council PA-C   Follow up/ Additional notes: Patient denies any modifying or aggravating factors.  Patient denies any gross hematuria, dysuria or suprapubic/flank pain.  Patient denies any fevers, chills, nausea or vomiting.   Daughter was concerned that she may have an UTI because she was having more incontinence.  Mrs. FGielstated she did not realize she had to urinate until she stands up and then it leaks out.  I explained this is not likely due to an UTI, because it was not associated with urgency.  We will send the urine for culture in case she develops UTI symptoms in the next few days.

## 2022-04-03 ENCOUNTER — Encounter: Payer: Self-pay | Admitting: Urology

## 2022-04-03 ENCOUNTER — Ambulatory Visit (INDEPENDENT_AMBULATORY_CARE_PROVIDER_SITE_OTHER): Payer: Medicare Other | Admitting: Urology

## 2022-04-03 ENCOUNTER — Other Ambulatory Visit
Admission: RE | Admit: 2022-04-03 | Discharge: 2022-04-03 | Disposition: A | Discharge status: Home / Self Care | Payer: Medicare Other | Attending: Urology | Admitting: Urology

## 2022-04-03 DIAGNOSIS — C662 Malignant neoplasm of left ureter: Secondary | ICD-10-CM

## 2022-04-03 DIAGNOSIS — R3129 Other microscopic hematuria: Secondary | ICD-10-CM | POA: Diagnosis not present

## 2022-04-03 LAB — URINALYSIS, COMPLETE (UACMP) WITH MICROSCOPIC
Bilirubin Urine: NEGATIVE
Glucose, UA: NEGATIVE mg/dL
Ketones, ur: NEGATIVE mg/dL
Nitrite: POSITIVE — AB
Protein, ur: 30 mg/dL — AB
Specific Gravity, Urine: 1.015 (ref 1.005–1.030)
WBC, UA: >50 WBC/hpf (ref 0–5)
pH: 6.5 (ref 5.0–8.0)

## 2022-04-03 MED ORDER — BCG LIVE 50 MG IS SUSR
3.2400 mL | Freq: Once | INTRAVESICAL | Status: AC
  Administered 2022-04-03: 81 mg via INTRAVESICAL

## 2022-04-04 ENCOUNTER — Ambulatory Visit: Payer: Medicare Other | Admitting: Family Medicine

## 2022-04-06 ENCOUNTER — Telehealth: Payer: Self-pay | Admitting: Family Medicine

## 2022-04-06 LAB — URINE CULTURE: Culture: >=100000 — AB

## 2022-04-06 MED ORDER — CIPROFLOXACIN HCL 500 MG PO TABS: 500.0000 mg | ORAL_TABLET | Freq: Two times a day (BID) | ORAL | 0 refills | Status: DC

## 2022-04-06 NOTE — Telephone Encounter (Signed)
Patient notified and voiced understanding. ABX sent to pharmacy.  

## 2022-04-06 NOTE — Telephone Encounter (Signed)
-----  Message from Nori Riis, PA-C sent at 04/06/2022 12:03 PM EST ----- Please let Elizabeth Trujillo's urine culture grew out a bacteria.  I recommend that she take Cipro 500 mg twice daily for three days and still come for her BCG on Monday.

## 2022-04-09 NOTE — Progress Notes (Unsigned)
BCG Bladder Instillation  BCG # 5/6  Due to Bladder Cancer patient is present today for a BCG treatment. Patient was cleaned and prepped in a sterile fashion with betadine. A 14 FR catheter was inserted, urine return was noted 100 ml, urine was yellow in color.  60m of reconstituted BCG was instilled into the bladder. The catheter was then removed. Patient tolerated well, no complications were noted  Performed by: SZara Council PA-C   Follow up/ Additional notes: It is also noted that patient has tinea cruris within the pannus of her abdomen that looks quite severe.  We refilled her nystatin cream this morning, but I went ahead and prescribe Diflucan 100 mg for 7 days to treat the skin rash.   1 week for number 6 out of 6 BCG treatment

## 2022-04-10 ENCOUNTER — Telehealth: Payer: Self-pay

## 2022-04-10 ENCOUNTER — Ambulatory Visit (INDEPENDENT_AMBULATORY_CARE_PROVIDER_SITE_OTHER): Payer: Medicare Other | Admitting: Urology

## 2022-04-10 ENCOUNTER — Encounter: Payer: Self-pay | Admitting: Urology

## 2022-04-10 VITALS — BP 146/77 | HR 83 | Ht 62.0 in | Wt 210.0 lb

## 2022-04-10 DIAGNOSIS — R3129 Other microscopic hematuria: Secondary | ICD-10-CM

## 2022-04-10 DIAGNOSIS — C662 Malignant neoplasm of left ureter: Secondary | ICD-10-CM

## 2022-04-10 DIAGNOSIS — B372 Candidiasis of skin and nail: Secondary | ICD-10-CM

## 2022-04-10 MED ORDER — NYSTATIN 100000 UNIT/GM EX CREA: 1.0000 | TOPICAL_CREAM | Freq: Every day | CUTANEOUS | 0 refills | Status: DC | PRN

## 2022-04-10 MED ORDER — FLUCONAZOLE 100 MG PO TABS: 100.0000 mg | ORAL_TABLET | Freq: Every day | ORAL | 0 refills | Status: DC

## 2022-04-10 MED ORDER — BCG LIVE 50 MG IS SUSR
3.2400 mL | Freq: Once | INTRAVESICAL | Status: AC
  Administered 2022-04-10: 81 mg via INTRAVESICAL

## 2022-04-10 NOTE — Telephone Encounter (Signed)
Incoming call from pt on triage line who states that she is having a flair up of a yeast infection on the skin under her abdomen. She states that the skin is red and itchy. She states that it is making her very uncomfortable and she would like a refill on her nystatin cream. She would like this refilled prior to her appointment today as she states she cannot deal with any more discomfort. RX sent. She also questions if a skin infection would prohibit her BCG treatments. Advised pt that a skin infection would not stop her treatment for today. Pt voiced understanding.

## 2022-04-11 LAB — URINALYSIS, COMPLETE
Bilirubin, UA: NEGATIVE
Glucose, UA: NEGATIVE
Ketones, UA: NEGATIVE
Nitrite, UA: NEGATIVE
Specific Gravity, UA: 1.02 (ref 1.005–1.030)
Urobilinogen, Ur: 0.2 mg/dL (ref 0.2–1.0)
pH, UA: 5.5 (ref 5.0–7.5)

## 2022-04-11 LAB — MICROSCOPIC EXAMINATION: RBC, Urine: >30 /hpf — AB (ref 0–2)

## 2022-04-17 ENCOUNTER — Other Ambulatory Visit
Admission: RE | Admit: 2022-04-17 | Discharge: 2022-04-17 | Disposition: A | Discharge status: Home / Self Care | Payer: Medicare Other | Attending: Urology | Admitting: Urology

## 2022-04-17 ENCOUNTER — Other Ambulatory Visit: Payer: Self-pay

## 2022-04-17 ENCOUNTER — Ambulatory Visit (INDEPENDENT_AMBULATORY_CARE_PROVIDER_SITE_OTHER): Payer: Medicare Other | Admitting: Urology

## 2022-04-17 DIAGNOSIS — Z87440 Personal history of urinary (tract) infections: Secondary | ICD-10-CM | POA: Diagnosis not present

## 2022-04-17 DIAGNOSIS — R300 Dysuria: Secondary | ICD-10-CM | POA: Insufficient documentation

## 2022-04-17 DIAGNOSIS — Z8551 Personal history of malignant neoplasm of bladder: Secondary | ICD-10-CM

## 2022-04-17 DIAGNOSIS — C679 Malignant neoplasm of bladder, unspecified: Secondary | ICD-10-CM

## 2022-04-17 LAB — URINALYSIS, COMPLETE (UACMP) WITH MICROSCOPIC
Bilirubin Urine: NEGATIVE
Glucose, UA: NEGATIVE mg/dL
Ketones, ur: NEGATIVE mg/dL
Nitrite: NEGATIVE
Protein, ur: 100 mg/dL — AB
RBC / HPF: >50 RBC/hpf (ref 0–5)
Specific Gravity, Urine: 1.02 (ref 1.005–1.030)
pH: 5.5 (ref 5.0–8.0)

## 2022-04-17 MED ORDER — BCG LIVE 50 MG IS SUSR
3.2400 mL | Freq: Once | INTRAVESICAL | Status: AC
  Administered 2022-04-17: 81 mg via INTRAVESICAL

## 2022-04-17 NOTE — Progress Notes (Signed)
BCG Bladder Instillation  BCG # 6/6  Due to Bladder Cancer patient is present today for a BCG treatment. Patient was cleaned and prepped in a sterile fashion with betadine. A 14 FR catheter was inserted, urine return was noted 50 ml, urine was yellow in color.  35m of reconstituted BCG was instilled into the bladder. The catheter was then removed. Patient tolerated well, no complications were noted  Performed by: SZara Council PA-C and ODarrick Grinder CMA  Follow up/ Additional notes: Follow-up for second look/diagnostic ureteroscopy in 4 to 6 weeks

## 2022-04-18 LAB — URINE CULTURE: Culture: NO GROWTH

## 2022-04-19 ENCOUNTER — Telehealth: Payer: Self-pay | Admitting: Family Medicine

## 2022-04-19 NOTE — Telephone Encounter (Signed)
-----   Message from Nori Riis, PA-C sent at 04/19/2022  9:05 AM EST ----- Please let Mrs. Hudman know that her urine culture was negative.  Dr. Erlene Quan wants to take her to the [facility] in April.

## 2022-04-19 NOTE — Telephone Encounter (Signed)
Patient notified and voiced understanding.

## 2022-04-25 ENCOUNTER — Other Ambulatory Visit: Payer: Self-pay | Admitting: Urology

## 2022-04-25 DIAGNOSIS — C662 Malignant neoplasm of left ureter: Secondary | ICD-10-CM

## 2022-04-25 DIAGNOSIS — R8271 Bacteriuria: Secondary | ICD-10-CM

## 2022-04-25 NOTE — Progress Notes (Unsigned)
Surgical Physician Order Form [facility] [place]  * Scheduling expectation :  Mid April with Dr. Erlene Quan  *Length of Case:   *Clearance needed: no  *Anticoagulation Instructions: Hold all anticoagulants  *Aspirin Instructions: Hold Aspirin  *Post-op visit Date/Instructions:   TBD  *Diagnosis:  Left upper tract urothelial carcinoma   *Procedure: left diagnostic ureteroscopy, left retrograde, left ureteral stent exchange w/ cystoscopy w/ possible biopsy    Additional orders: Gemcitabine 2030m bladder instillation  -Admit type: OUTpatient  -Anesthesia: General  -VTE Prophylaxis Standing Order SCD's       Other:   -Standing Lab Orders Per Anesthesia    Lab other: UA&Urine Culture  -Standing Test orders EKG/Chest x-ray per Anesthesia       Test other:   - Medications:  Ancef 2gm IV  -Other orders:  N/A

## 2022-04-26 ENCOUNTER — Telehealth: Payer: Self-pay

## 2022-04-26 NOTE — Telephone Encounter (Signed)
I spoke with Elizabeth Trujillo. We have discussed possible surgery dates and Monday April 15th, 2024 was agreed upon by all parties. Patient given information about surgery date, what to expect pre-operatively and post operatively.  We discussed that a Pre-Admission Testing office will be calling to set up the pre-op visit that will take place prior to surgery, and that these appointments are typically done over the phone with a Pre-Admissions RN. Informed patient that our office will communicate any additional care to be provided after surgery. Patients questions or concerns were discussed during our call. Advised to call our office should there be any additional information, questions or concerns that arise. Patient verbalized understanding.

## 2022-04-26 NOTE — Progress Notes (Signed)
   [facility] From  Surgery Date: Date: 06/26/2022  Surgeon: Dr. Hollice Espy, MD  Inpt ( No  )   Outpt (Yes)   Obs ( No  )   Diagnosis: C66.2 Urothelial Carcinoma of Left Distal Ureter  -CPT: 84039, 79536, 92230, 52310, 51720, [id number], [id number]  Surgery: Left Diagnostic Ureteroscopy with Left Retrograde Pyelogram, Left Ureteral Stent Exchange versus Removal, Cystoscopy with possible Bladder and/ or Ureteral Biopsy and Intravesical Instillation of Gemcitabine  Stop Anticoagulations: Yes and also hold ASA  Cardiac/Medical/Pulmonary Clearance needed: no  *Orders entered into EPIC  Date: 04/26/22   *Case booked in [place]  Date: 04/25/2022  *Notified pt of Surgery: Date: 04/25/2022  PRE-OP UA & CX: yes, will obtain in the [facility] on 06/15/2022  *Placed into Prior Authorization Work Fabio Bering Date: 04/26/22  Assistant/laser/rep:No

## 2022-04-27 ENCOUNTER — Telehealth: Payer: Self-pay

## 2022-04-27 NOTE — Telephone Encounter (Signed)
Patient states that this started during the night last night, when she got up to urinate, noticed slight amount of blood just enough to color the toilet water but not dark large amount. No clots. No other symptoms that were listed below. She did have 2 times in between of clear urine so far. Patient states she was a little concerned because she has never had blood from any cysto or BCG treatments and wanted to make sure this is ok. She was advised to push fluids.

## 2022-04-27 NOTE — Telephone Encounter (Signed)
Patient left message on triage line, stating that she finished BCG treatment on 04/17/22 (6 weeks worth). She did not have any blood during that time but the last 4 times she urinated starting yesterday she had a good amount of blood. Right before she called [place] she did not have any blood in the urine. She has no other symptoms. She wants to know if there is anything she needs to do about this at this time.

## 2022-04-27 NOTE — Telephone Encounter (Signed)
Patient advised.

## 2022-04-30 ENCOUNTER — Other Ambulatory Visit: Payer: Self-pay | Admitting: Family Medicine

## 2022-04-30 DIAGNOSIS — E7800 Pure hypercholesterolemia, unspecified: Secondary | ICD-10-CM

## 2022-05-02 ENCOUNTER — Telehealth: Payer: Self-pay

## 2022-05-02 ENCOUNTER — Other Ambulatory Visit: Payer: Self-pay

## 2022-05-02 DIAGNOSIS — R300 Dysuria: Secondary | ICD-10-CM

## 2022-05-02 DIAGNOSIS — R310 Gross hematuria: Secondary | ICD-10-CM

## 2022-05-02 NOTE — Telephone Encounter (Signed)
Patient called stating that she has been having intense pressure while urinating, pain, spasms like sensation in her bladder. Sensation reminds her of symptoms she would have the day of BCG treatment. No fever. She has also noticed she has been having leaking of urine. Spoke with Larene Beach, ordered KUB, UA and Urine Culture and patient will go tomorrow in [place] location to get this done. No blood seen since 04/28/22

## 2022-05-03 ENCOUNTER — Other Ambulatory Visit
Admission: RE | Admit: 2022-05-03 | Discharge: 2022-05-03 | Disposition: A | Discharge status: Home / Self Care | Payer: Medicare Other | Source: Home / Self Care | Attending: Urology | Admitting: Urology

## 2022-05-03 ENCOUNTER — Ambulatory Visit
Admission: RE | Admit: 2022-05-03 | Discharge: 2022-05-03 | Disposition: A | Discharge status: Home / Self Care | Payer: Medicare Other | Attending: Urology | Admitting: Urology

## 2022-05-03 ENCOUNTER — Ambulatory Visit
Admission: RE | Admit: 2022-05-03 | Discharge: 2022-05-03 | Disposition: A | Discharge status: Home / Self Care | Payer: Medicare Other | Source: Ambulatory Visit | Attending: Urology | Admitting: Urology

## 2022-05-03 ENCOUNTER — Other Ambulatory Visit: Admission: RE | Admit: 2022-05-03 | Payer: Medicare Other | Source: Home / Self Care

## 2022-05-03 DIAGNOSIS — R300 Dysuria: Secondary | ICD-10-CM | POA: Diagnosis not present

## 2022-05-03 DIAGNOSIS — Z6837 Body mass index (BMI) 37.0-37.9, adult: Secondary | ICD-10-CM | POA: Diagnosis not present

## 2022-05-03 DIAGNOSIS — Z96651 Presence of right artificial knee joint: Secondary | ICD-10-CM | POA: Diagnosis not present

## 2022-05-03 DIAGNOSIS — R310 Gross hematuria: Secondary | ICD-10-CM | POA: Insufficient documentation

## 2022-05-03 DIAGNOSIS — K649 Unspecified hemorrhoids: Secondary | ICD-10-CM | POA: Diagnosis not present

## 2022-05-03 DIAGNOSIS — E119 Type 2 diabetes mellitus without complications: Secondary | ICD-10-CM | POA: Diagnosis not present

## 2022-05-03 DIAGNOSIS — K5900 Constipation, unspecified: Secondary | ICD-10-CM | POA: Diagnosis not present

## 2022-05-03 DIAGNOSIS — Z23 Encounter for immunization: Secondary | ICD-10-CM | POA: Diagnosis not present

## 2022-05-03 DIAGNOSIS — Z8554 Personal history of malignant neoplasm of ureter: Secondary | ICD-10-CM | POA: Diagnosis not present

## 2022-05-03 LAB — URINALYSIS, COMPLETE (UACMP) WITH MICROSCOPIC
Bilirubin Urine: NEGATIVE
Glucose, UA: NEGATIVE mg/dL
Ketones, ur: NEGATIVE mg/dL
Nitrite: NEGATIVE
Protein, ur: >300 mg/dL — AB
Specific Gravity, Urine: 1.025 (ref 1.005–1.030)
pH: 6 (ref 5.0–8.0)

## 2022-05-04 LAB — URINE CULTURE: Culture: NO GROWTH

## 2022-05-05 ENCOUNTER — Telehealth: Payer: Self-pay | Admitting: Family Medicine

## 2022-05-05 NOTE — Telephone Encounter (Signed)
Patient notified and voiced understanding. She states she is taking the medication. She states she is feeling a little better today.

## 2022-05-05 NOTE — Telephone Encounter (Signed)
-----   Message from Nori Riis, PA-C sent at 05/04/2022  9:46 PM EST ----- Please let Mr. Bessette know that her urine culture was negative and her stent is in place.  She is likely experiencing stent discomfort.  Is she taking the tamsulosin and oxybutynin?

## 2022-05-23 ENCOUNTER — Other Ambulatory Visit: Payer: Self-pay | Admitting: Urology

## 2022-05-23 DIAGNOSIS — B372 Candidiasis of skin and nail: Secondary | ICD-10-CM

## 2022-05-28 ENCOUNTER — Other Ambulatory Visit: Payer: Self-pay | Admitting: Gastroenterology

## 2022-05-28 DIAGNOSIS — E7800 Pure hypercholesterolemia, unspecified: Secondary | ICD-10-CM

## 2022-06-02 ENCOUNTER — Other Ambulatory Visit: Payer: Self-pay | Admitting: Gastroenterology

## 2022-06-02 DIAGNOSIS — E7800 Pure hypercholesterolemia, unspecified: Secondary | ICD-10-CM

## 2022-06-14 ENCOUNTER — Other Ambulatory Visit: Payer: Self-pay | Admitting: Family Medicine

## 2022-06-14 DIAGNOSIS — E119 Type 2 diabetes mellitus without complications: Secondary | ICD-10-CM

## 2022-06-16 ENCOUNTER — Other Ambulatory Visit
Admission: RE | Admit: 2022-06-16 | Discharge: 2022-06-16 | Disposition: A | Discharge status: Home / Self Care | Payer: Medicare Other | Attending: Urology | Admitting: Urology

## 2022-06-16 DIAGNOSIS — R8271 Bacteriuria: Secondary | ICD-10-CM

## 2022-06-16 DIAGNOSIS — C662 Malignant neoplasm of left ureter: Secondary | ICD-10-CM

## 2022-06-17 ENCOUNTER — Other Ambulatory Visit: Payer: Self-pay | Admitting: Family Medicine

## 2022-06-17 DIAGNOSIS — E119 Type 2 diabetes mellitus without complications: Secondary | ICD-10-CM

## 2022-06-19 ENCOUNTER — Other Ambulatory Visit: Payer: Self-pay

## 2022-06-19 ENCOUNTER — Other Ambulatory Visit
Admission: RE | Admit: 2022-06-19 | Discharge: 2022-06-19 | Disposition: A | Discharge status: Home / Self Care | Payer: Medicare Other | Attending: Urology | Admitting: Urology

## 2022-06-19 DIAGNOSIS — Z87440 Personal history of urinary (tract) infections: Secondary | ICD-10-CM | POA: Insufficient documentation

## 2022-06-19 DIAGNOSIS — Z01812 Encounter for preprocedural laboratory examination: Secondary | ICD-10-CM

## 2022-06-19 DIAGNOSIS — R300 Dysuria: Secondary | ICD-10-CM | POA: Diagnosis not present

## 2022-06-19 DIAGNOSIS — C662 Malignant neoplasm of left ureter: Secondary | ICD-10-CM | POA: Insufficient documentation

## 2022-06-19 DIAGNOSIS — R310 Gross hematuria: Secondary | ICD-10-CM | POA: Diagnosis not present

## 2022-06-19 LAB — URINALYSIS, COMPLETE (UACMP) WITH MICROSCOPIC
Bilirubin Urine: NEGATIVE
Glucose, UA: NEGATIVE mg/dL
Ketones, ur: NEGATIVE mg/dL
Nitrite: NEGATIVE
Specific Gravity, Urine: 1.02 (ref 1.005–1.030)
WBC, UA: >50 WBC/hpf (ref 0–5)
pH: 7 (ref 5.0–8.0)

## 2022-06-20 ENCOUNTER — Other Ambulatory Visit: Payer: Self-pay

## 2022-06-20 ENCOUNTER — Encounter
Admission: RE | Admit: 2022-06-20 | Discharge: 2022-06-20 | Disposition: A | Discharge status: Home / Self Care | Payer: Medicare Other | Source: Ambulatory Visit | Attending: Urology | Admitting: Urology

## 2022-06-20 VITALS — Ht 62.0 in | Wt 210.0 lb

## 2022-06-20 DIAGNOSIS — I10 Essential (primary) hypertension: Secondary | ICD-10-CM

## 2022-06-20 DIAGNOSIS — E119 Type 2 diabetes mellitus without complications: Secondary | ICD-10-CM

## 2022-06-20 HISTORY — DX: Mild cognitive impairment of uncertain or unknown etiology: G31.84

## 2022-06-20 HISTORY — DX: Unspecified convulsions: R56.9

## 2022-06-20 HISTORY — DX: Anesthesia of skin: R20.0

## 2022-06-20 HISTORY — DX: White matter disease, unspecified: R90.82

## 2022-06-20 HISTORY — DX: Anesthesia of skin: R20.2

## 2022-06-20 LAB — URINE CULTURE: Culture: NO GROWTH

## 2022-06-20 NOTE — Patient Instructions (Addendum)
Your procedure is scheduled RX:[id number] June 26, 2022. Report to the Registration Desk on the 1st floor of the [facility]. To find out your arrival time, please call [phone] between 1PM - 3PM on: Friday June 23, 2022. If your arrival time is 6:00 am, do not arrive before that time as the [facility] entrance doors do not open until 6:00 am.  REMEMBER: Instructions that are not followed completely may result in serious medical risk, up to and including death; or upon the discretion of your surgeon and anesthesiologist your surgery may need to be rescheduled.  Do not eat food or drink fluids after midnight the night before surgery.  No gum chewing or hard candies.   One week prior to surgery: Stop Anti-inflammatories (NSAIDS) such as Advil, Aleve, Ibuprofen, Motrin, Naproxen, Naprosyn and Aspirin based products such as Excedrin, Goody's Powder, BC Powder. Stop ANY OVER THE COUNTER supplements until after surgery. You may however, continue to take Tylenol if needed for pain up until the day of surgery.  Continue taking all prescribed medications with the exception of the following: Stop aspirin 81 MG chewable 5 days before your procedure as instructed by your doctor.  Stop metFORMIN (GLUCOPHAGE) 500 MG 2 days prior to surgery (take last dose 06/23/22) Follow recommendations from Cardiologist or PCP regarding stopping blood thinners.  TAKE ONLY THESE MEDICATIONS THE MORNING OF SURGERY WITH A SIP OF WATER:  levETIRAcetam (KEPPRA) 500 MG  oxybutynin (DITROPAN) 5 MG    No Alcohol for 24 hours before or after surgery.  No Smoking including e-cigarettes for 24 hours before surgery.  No chewable tobacco products for at least 6 hours before surgery.  No nicotine patches on the day of surgery.  Do not use any "recreational" drugs for at least a week (preferably 2 weeks) before your surgery.  Please be advised that the combination of cocaine and anesthesia may have negative outcomes,  up to and including death. If you test positive for cocaine, your surgery will be cancelled.  On the morning of surgery brush your teeth with toothpaste and water, you may rinse your mouth with mouthwash if you wish.  Do not swallow any toothpaste or mouthwash.  Use CHG Soap or wipes as directed on instruction sheet.  Do not wear jewelry, make-up, hairpins, clips or nail polish.  Do not wear lotions, powders, or perfumes.   Do not shave body hair from the neck down 48 hours before surgery.  Contact lenses, hearing aids and dentures may not be worn into surgery.  Do not bring valuables to the hospital. [facility] is not responsible for any missing/lost belongings or valuables.   Notify your doctor if there is any change in your medical condition (cold, fever, infection).  Wear comfortable clothing (specific to your surgery type) to the hospital.  After surgery, you can help prevent lung complications by doing breathing exercises.  Take deep breaths and cough every 1-2 hours. Your doctor may order a device called an Incentive Spirometer to help you take deep breaths. When coughing or sneezing, hold a pillow firmly against your incision with both hands. This is called "splinting." Doing this helps protect your incision. It also decreases belly discomfort.  If you are being admitted to the hospital overnight, leave your suitcase in the car. After surgery it may be brought to your room.  In case of increased patient census, it may be necessary for you, the patient, to continue your postoperative care in the Same Day Surgery department.  If you are being discharged the day of surgery, you will not be allowed to drive home. You will need a responsible individual to drive you home and stay with you for 24 hours after surgery.   If you are taking public transportation, you will need to have a responsible individual with you.  Please call the Pre-admissions Testing Dept. at [phone]  if you have any questions about these instructions.  Surgery Visitation Policy:  Patients having surgery or a procedure may have two visitors.  Children under the age of 62 must have an adult with them who is not the patient.  Inpatient Visitation:    Visiting hours are 7 a.m. to 8 p.m. Up to four visitors are allowed at one time in a patient room. The visitors may rotate out with other people during the day.  One visitor age 52 or older may stay with the patient overnight and must be in the room by 8 p.m.

## 2022-06-21 ENCOUNTER — Encounter
Admission: RE | Admit: 2022-06-21 | Discharge: 2022-06-21 | Disposition: A | Discharge status: Home / Self Care | Payer: Medicare Other | Source: Ambulatory Visit | Attending: Urology | Admitting: Urology

## 2022-06-21 DIAGNOSIS — I10 Essential (primary) hypertension: Secondary | ICD-10-CM | POA: Insufficient documentation

## 2022-06-21 DIAGNOSIS — Z01812 Encounter for preprocedural laboratory examination: Secondary | ICD-10-CM | POA: Diagnosis not present

## 2022-06-21 DIAGNOSIS — E119 Type 2 diabetes mellitus without complications: Secondary | ICD-10-CM | POA: Diagnosis not present

## 2022-06-21 LAB — BASIC METABOLIC PANEL
Anion gap: 9 (ref 5–15)
BUN: 19 mg/dL (ref 8–23)
CO2: 26 mmol/L (ref 22–32)
Calcium: 8.8 mg/dL — ABNORMAL LOW (ref 8.9–10.3)
Chloride: 107 mmol/L (ref 98–111)
Creatinine, Ser: 0.8 mg/dL (ref 0.44–1.00)
GFR, Estimated: >60 mL/min (ref >60)
Glucose, Bld: 142 mg/dL — ABNORMAL HIGH (ref 70–99)
Potassium: 3.2 mmol/L — ABNORMAL LOW (ref 3.5–5.1)
Sodium: 142 mmol/L (ref 135–145)

## 2022-06-21 LAB — CBC
HCT: 41.6 % (ref 36.0–46.0)
Hemoglobin: 13.7 g/dL (ref 12.0–15.0)
MCH: 29.6 pg (ref 26.0–34.0)
MCHC: 32.9 g/dL (ref 30.0–36.0)
MCV: 89.8 fL (ref 80.0–100.0)
Platelets: 362 10*3/uL (ref 150–400)
RBC: 4.63 MIL/uL (ref 3.87–5.11)
RDW: 14 % (ref 11.5–15.5)
WBC: 9.6 10*3/uL (ref 4.0–10.5)
nRBC: 0 % (ref 0.0–0.2)

## 2022-06-25 MED ORDER — ORAL CARE MOUTH RINSE: 15.0000 mL | Freq: Once | OROMUCOSAL | Status: AC

## 2022-06-25 MED ORDER — FAMOTIDINE 20 MG PO TABS
20.0000 mg | ORAL_TABLET | Freq: Once | ORAL | Status: AC
  Administered 2022-06-26: 20 mg via ORAL

## 2022-06-25 MED ORDER — GEMCITABINE CHEMO FOR BLADDER INSTILLATION 2000 MG
2000.0000 mg | Freq: Once | INTRAVENOUS | Status: DC
  Filled 2022-06-25: qty 52.6

## 2022-06-25 MED ORDER — SODIUM CHLORIDE 0.9 % IV SOLN: INTRAVENOUS | Status: DC

## 2022-06-25 MED ORDER — CHLORHEXIDINE GLUCONATE 0.12 % MT SOLN
15.0000 mL | Freq: Once | OROMUCOSAL | Status: AC
  Administered 2022-06-26: 15 mL via OROMUCOSAL

## 2022-06-26 ENCOUNTER — Ambulatory Visit
Admission: RE | Admit: 2022-06-26 | Discharge: 2022-06-26 | Disposition: A | Discharge status: Home / Self Care | Payer: Medicare Other | Source: Ambulatory Visit | Attending: Urology | Admitting: Urology

## 2022-06-26 ENCOUNTER — Ambulatory Visit: Payer: Medicare Other | Admitting: Urgent Care

## 2022-06-26 ENCOUNTER — Encounter
Admission: RE | Disposition: A | Discharge status: Home / Self Care | Payer: Self-pay | Source: Ambulatory Visit | Attending: Urology

## 2022-06-26 ENCOUNTER — Ambulatory Visit: Payer: Medicare Other

## 2022-06-26 ENCOUNTER — Other Ambulatory Visit: Payer: Self-pay

## 2022-06-26 ENCOUNTER — Ambulatory Visit: Payer: Medicare Other | Admitting: Certified Registered"

## 2022-06-26 ENCOUNTER — Encounter: Payer: Self-pay | Admitting: Urology

## 2022-06-26 DIAGNOSIS — Z08 Encounter for follow-up examination after completed treatment for malignant neoplasm: Secondary | ICD-10-CM | POA: Diagnosis not present

## 2022-06-26 DIAGNOSIS — C679 Malignant neoplasm of bladder, unspecified: Secondary | ICD-10-CM | POA: Diagnosis present

## 2022-06-26 DIAGNOSIS — I10 Essential (primary) hypertension: Secondary | ICD-10-CM | POA: Diagnosis not present

## 2022-06-26 DIAGNOSIS — C662 Malignant neoplasm of left ureter: Secondary | ICD-10-CM | POA: Diagnosis not present

## 2022-06-26 DIAGNOSIS — E119 Type 2 diabetes mellitus without complications: Secondary | ICD-10-CM | POA: Insufficient documentation

## 2022-06-26 DIAGNOSIS — Z853 Personal history of malignant neoplasm of breast: Secondary | ICD-10-CM | POA: Insufficient documentation

## 2022-06-26 DIAGNOSIS — Z09 Encounter for follow-up examination after completed treatment for conditions other than malignant neoplasm: Secondary | ICD-10-CM | POA: Insufficient documentation

## 2022-06-26 DIAGNOSIS — Z7984 Long term (current) use of oral hypoglycemic drugs: Secondary | ICD-10-CM | POA: Insufficient documentation

## 2022-06-26 DIAGNOSIS — K219 Gastro-esophageal reflux disease without esophagitis: Secondary | ICD-10-CM | POA: Diagnosis not present

## 2022-06-26 DIAGNOSIS — Z8554 Personal history of malignant neoplasm of ureter: Secondary | ICD-10-CM | POA: Insufficient documentation

## 2022-06-26 DIAGNOSIS — Z9012 Acquired absence of left breast and nipple: Secondary | ICD-10-CM | POA: Diagnosis not present

## 2022-06-26 DIAGNOSIS — N3289 Other specified disorders of bladder: Secondary | ICD-10-CM | POA: Diagnosis not present

## 2022-06-26 DIAGNOSIS — E782 Mixed hyperlipidemia: Secondary | ICD-10-CM | POA: Insufficient documentation

## 2022-06-26 DIAGNOSIS — Z8673 Personal history of transient ischemic attack (TIA), and cerebral infarction without residual deficits: Secondary | ICD-10-CM | POA: Insufficient documentation

## 2022-06-26 DIAGNOSIS — Z8551 Personal history of malignant neoplasm of bladder: Secondary | ICD-10-CM | POA: Diagnosis not present

## 2022-06-26 HISTORY — PX: CYSTOSCOPY WITH BIOPSY: SHX5122

## 2022-06-26 HISTORY — PX: BLADDER INSTILLATION: SHX6893

## 2022-06-26 HISTORY — PX: URETEROSCOPY: SHX842

## 2022-06-26 HISTORY — PX: CYSTOSCOPY W/ URETERAL STENT PLACEMENT: SHX1429

## 2022-06-26 LAB — GLUCOSE, CAPILLARY
Glucose-Capillary: 125 mg/dL — ABNORMAL HIGH (ref 70–99)
Glucose-Capillary: 126 mg/dL — ABNORMAL HIGH (ref 70–99)

## 2022-06-26 SURGERY — URETEROSCOPY
Anesthesia: General | Site: Ureter | Laterality: Left

## 2022-06-26 MED ORDER — IOHEXOL 180 MG/ML  SOLN
INTRAMUSCULAR | Status: DC | PRN
  Administered 2022-06-26: 20 mL

## 2022-06-26 MED ORDER — FENTANYL CITRATE (PF) 100 MCG/2ML IJ SOLN
25.0000 ug | INTRAMUSCULAR | Status: DC | PRN
  Administered 2022-06-26: 25 ug via INTRAVENOUS
  Administered 2022-06-26: 50 ug via INTRAVENOUS
  Administered 2022-06-26 (×3): 25 ug via INTRAVENOUS

## 2022-06-26 MED ORDER — KETOROLAC TROMETHAMINE 30 MG/ML IJ SOLN
INTRAMUSCULAR | Status: AC
  Filled 2022-06-26: qty 1

## 2022-06-26 MED ORDER — PHENYLEPHRINE 80 MCG/ML (10ML) SYRINGE FOR IV PUSH (FOR BLOOD PRESSURE SUPPORT)
PREFILLED_SYRINGE | INTRAVENOUS | Status: AC
  Filled 2022-06-26: qty 10

## 2022-06-26 MED ORDER — LIDOCAINE HCL (CARDIAC) PF 100 MG/5ML IV SOSY
PREFILLED_SYRINGE | INTRAVENOUS | Status: DC | PRN
  Administered 2022-06-26: 40 mg via INTRAVENOUS

## 2022-06-26 MED ORDER — CEFAZOLIN SODIUM-DEXTROSE 2-4 GM/100ML-% IV SOLN
2.0000 g | INTRAVENOUS | Status: AC
  Administered 2022-06-26: 2 g via INTRAVENOUS

## 2022-06-26 MED ORDER — ONDANSETRON HCL 4 MG/2ML IJ SOLN
INTRAMUSCULAR | Status: AC
  Filled 2022-06-26: qty 2

## 2022-06-26 MED ORDER — PROPOFOL 10 MG/ML IV BOLUS
INTRAVENOUS | Status: DC | PRN
  Administered 2022-06-26: 120 mg via INTRAVENOUS

## 2022-06-26 MED ORDER — HYDROCODONE-ACETAMINOPHEN 5-325 MG PO TABS: 1.0000 | ORAL_TABLET | Freq: Four times a day (QID) | ORAL | 0 refills | Status: DC | PRN

## 2022-06-26 MED ORDER — PHENYLEPHRINE HCL (PRESSORS) 10 MG/ML IV SOLN
INTRAVENOUS | Status: DC | PRN
  Administered 2022-06-26 (×2): 80 ug via INTRAVENOUS

## 2022-06-26 MED ORDER — STERILE WATER FOR IRRIGATION IR SOLN
Status: DC | PRN
  Administered 2022-06-26: 3000 mL

## 2022-06-26 MED ORDER — ACETAMINOPHEN 10 MG/ML IV SOLN
INTRAVENOUS | Status: AC
  Filled 2022-06-26: qty 100

## 2022-06-26 MED ORDER — KETOROLAC TROMETHAMINE 30 MG/ML IJ SOLN
INTRAMUSCULAR | Status: DC | PRN
  Administered 2022-06-26: 15 mg via INTRAVENOUS

## 2022-06-26 MED ORDER — CEFAZOLIN SODIUM-DEXTROSE 2-4 GM/100ML-% IV SOLN
INTRAVENOUS | Status: AC
  Filled 2022-06-26: qty 100

## 2022-06-26 MED ORDER — DEXAMETHASONE SODIUM PHOSPHATE 10 MG/ML IJ SOLN
INTRAMUSCULAR | Status: AC
  Filled 2022-06-26: qty 1

## 2022-06-26 MED ORDER — PROPOFOL 10 MG/ML IV BOLUS
INTRAVENOUS | Status: AC
  Filled 2022-06-26: qty 20

## 2022-06-26 MED ORDER — FENTANYL CITRATE (PF) 100 MCG/2ML IJ SOLN
INTRAMUSCULAR | Status: AC
  Filled 2022-06-26: qty 2

## 2022-06-26 MED ORDER — OXYCODONE HCL 5 MG PO TABS
5.0000 mg | ORAL_TABLET | ORAL | Status: AC | PRN
  Administered 2022-06-26: 5 mg via ORAL

## 2022-06-26 MED ORDER — LIDOCAINE HCL (PF) 2 % IJ SOLN
INTRAMUSCULAR | Status: AC
  Filled 2022-06-26: qty 5

## 2022-06-26 MED ORDER — DEXAMETHASONE SODIUM PHOSPHATE 10 MG/ML IJ SOLN
INTRAMUSCULAR | Status: DC | PRN
  Administered 2022-06-26: 5 mg via INTRAVENOUS

## 2022-06-26 MED ORDER — OXYCODONE HCL 5 MG PO TABS
ORAL_TABLET | ORAL | Status: AC
  Filled 2022-06-26: qty 1

## 2022-06-26 MED ORDER — ACETAMINOPHEN 10 MG/ML IV SOLN
INTRAVENOUS | Status: DC | PRN
  Administered 2022-06-26: 1000 mg via INTRAVENOUS

## 2022-06-26 MED ORDER — ONDANSETRON HCL 4 MG/2ML IJ SOLN: 4.0000 mg | Freq: Once | INTRAMUSCULAR | Status: DC | PRN

## 2022-06-26 MED ORDER — FAMOTIDINE 20 MG PO TABS
ORAL_TABLET | ORAL | Status: AC
  Filled 2022-06-26: qty 1

## 2022-06-26 MED ORDER — ONDANSETRON HCL 4 MG/2ML IJ SOLN
INTRAMUSCULAR | Status: DC | PRN
  Administered 2022-06-26: 4 mg via INTRAVENOUS

## 2022-06-26 MED ORDER — FENTANYL CITRATE (PF) 100 MCG/2ML IJ SOLN
INTRAMUSCULAR | Status: DC | PRN
  Administered 2022-06-26: 25 ug via INTRAVENOUS
  Administered 2022-06-26: 50 ug via INTRAVENOUS
  Administered 2022-06-26: 25 ug via INTRAVENOUS

## 2022-06-26 MED ORDER — CHLORHEXIDINE GLUCONATE 0.12 % MT SOLN
OROMUCOSAL | Status: AC
  Filled 2022-06-26: qty 15

## 2022-06-26 MED ORDER — FENTANYL CITRATE PF 50 MCG/ML IJ SOSY
25.0000 ug | PREFILLED_SYRINGE | INTRAMUSCULAR | Status: AC | PRN
  Administered 2022-06-26 (×2): 25 ug via INTRAVENOUS

## 2022-06-26 MED ORDER — FENTANYL CITRATE (PF) 100 MCG/2ML IJ SOLN: 25.0000 ug | Freq: Every day | INTRAMUSCULAR | Status: DC | PRN

## 2022-06-26 SURGICAL SUPPLY — 38 items
BAG DRAIN SIEMENS DORNER NS (MISCELLANEOUS) ×2 IMPLANT
BAG DRN NS LF (MISCELLANEOUS) ×2
BAG PRESSURE INF REUSE 3000 (BAG) ×2 IMPLANT
BRUSH SCRUB EZ  4% CHG (MISCELLANEOUS) ×2
BRUSH SCRUB EZ 1% IODOPHOR (MISCELLANEOUS) ×2 IMPLANT
BRUSH SCRUB EZ 4% CHG (MISCELLANEOUS) ×2 IMPLANT
CATH URET FLEX-TIP 2 LUMEN 10F (CATHETERS) ×2 IMPLANT
CATH URETL OPEN 5X70 (CATHETERS) ×2 IMPLANT
CNTNR URN SCR LID CUP LEK RST (MISCELLANEOUS) ×2 IMPLANT
CONT SPEC 4OZ STRL OR WHT (MISCELLANEOUS) ×2
DRAPE UTILITY 15X26 TOWEL STRL (DRAPES) ×2 IMPLANT
DRSG TELFA 3X4 N-ADH STERILE (GAUZE/BANDAGES/DRESSINGS) ×2 IMPLANT
ELECT REM PT RETURN 9FT ADLT (ELECTROSURGICAL) ×2
ELECTRODE REM PT RTRN 9FT ADLT (ELECTROSURGICAL) ×2 IMPLANT
FORCEPS BIOP PIRANHA Y (CUTTING FORCEPS) IMPLANT
GAUZE 4X4 16PLY ~~LOC~~+RFID DBL (SPONGE) ×4 IMPLANT
GLOVE BIO SURGEON STRL SZ 6.5 (GLOVE) ×2 IMPLANT
GOWN STRL REUS W/ TWL LRG LVL3 (GOWN DISPOSABLE) ×4 IMPLANT
GOWN STRL REUS W/ TWL XL LVL3 (GOWN DISPOSABLE) ×2 IMPLANT
GOWN STRL REUS W/TWL LRG LVL3 (GOWN DISPOSABLE) ×4
GOWN STRL REUS W/TWL XL LVL3 (GOWN DISPOSABLE) ×2
GUIDEWIRE GREEN .038 145CM (MISCELLANEOUS) ×2 IMPLANT
GUIDEWIRE STR DUAL SENSOR (WIRE) ×4 IMPLANT
IV NS IRRIG 3000ML ARTHROMATIC (IV SOLUTION) ×2 IMPLANT
KIT TURNOVER CYSTO (KITS) ×2 IMPLANT
MANIFOLD NEPTUNE II (INSTRUMENTS) ×2 IMPLANT
NDL SAFETY ECLIP 18X1.5 (MISCELLANEOUS) ×2 IMPLANT
PACK CYSTO AR (MISCELLANEOUS) ×2 IMPLANT
SET CYSTO W/LG BORE CLAMP LF (SET/KITS/TRAYS/PACK) ×2 IMPLANT
SHEATH NAVIGATOR HD 12/14X36 (SHEATH) ×2 IMPLANT
STENT URET 6FRX24 CONTOUR (STENTS) ×2 IMPLANT
STENT URET 6FRX26 CONTOUR (STENTS) ×2 IMPLANT
STENT URO INLAY 6FRX24CM (STENTS) IMPLANT
SURGILUBE 2OZ TUBE FLIPTOP (MISCELLANEOUS) ×2 IMPLANT
TRAP FLUID SMOKE EVACUATOR (MISCELLANEOUS) ×2 IMPLANT
WATER STERILE IRR 1000ML POUR (IV SOLUTION) ×2 IMPLANT
WATER STERILE IRR 3000ML UROMA (IV SOLUTION) ×2 IMPLANT
WATER STERILE IRR 500ML POUR (IV SOLUTION) ×2 IMPLANT

## 2022-06-26 NOTE — H&P (Signed)
06/26/22  [facility] CT[facility]  Elizabeth Trujillo 06-30-1935 [id number]   Referring provider: Maple Hudson., MD No address on file      Chief Complaint  Patient presents with   Follow-up      HPI: 87 year old female with recurrent left distal ureteral high-grade TCC who presents today for diagnostic ureteroscopy following another induction course of intravesical BCG with ureteral stent in place.   Most recently, she had recurrence of left distal high-grade TCC of the left distal ureter on 01/09/22.  She had a second look in November.  Ultimately, she ended up electing to undergo another induction course of BCG with her stent in place.  That was completed several months ago she presents today for diagnostic ureteroscopy.  She reports today that she has not been tolerating the stent particularly well.  Her preoperative urine culture is negative.        PMH:     Past Medical History:  Diagnosis Date   Anemia     Arthritis     Breast cancer (HCC) 1986    left mastectomy   carcinoma in situ of urinary bladder 2019    BCG tx per pt   Collagen vascular disease (HCC)      RA    Diabetes mellitus without complication (HCC)     Elevated triglycerides with high cholesterol     Family history of bladder cancer     Family history of breast cancer     Family history of lung cancer     GERD (gastroesophageal reflux disease)      history of reflux   History of cancer of ureter     Hypercholesterolemia     Hypertension      history of    Primary ureteral papillary carcinoma, left (HCC) 2019    Surgical resection and BCG  tx per pt   Seizure (HCC)      last seizure was early 2023   Stroke [facility])      TIA-possible seizure instead of tia per neurologist   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HCC)      Left Ureter   UTI (urinary tract infection)        Surgical History:      Past Surgical History:  Procedure Laterality Date   ABDOMINAL HYSTERECTOMY   09/1984    TAH/BSO   BIOPSY Left  11/11/2018    Procedure: BIOPSY;  Surgeon: Vanna Scotland, MD;  Location: [facility];  Service: Urology;  Laterality: Left;   BREAST BIOPSY Right 01/23/2013    [place] bx/clip-neg   BREAST SURGERY Left 07/1984    Mastectomy   callus removed from left toe       CATARACT EXTRACTION W/ INTRAOCULAR LENS IMPLANT Left 2015   COLONOSCOPY       CYSTOSCOPY W/ RETROGRADES Left 02/24/2019    Procedure: CYSTOSCOPY WITH RETROGRADE PYELOGRAM;  Surgeon: Vanna Scotland, MD;  Location: [facility];  Service: Urology;  Laterality: Left;   CYSTOSCOPY W/ URETERAL STENT PLACEMENT Left 01/09/2022    Procedure: CYSTOSCOPY WITH RETROGRADE PYELOGRAM/URETERAL STENT PLACEMENT;  Surgeon: Vanna Scotland, MD;  Location: [facility];  Service: Urology;  Laterality: Left;   CYSTOSCOPY WITH BIOPSY N/A 02/24/2019    Procedure: CYSTOSCOPY WITH Bladder BIOPSY;  Surgeon: Vanna Scotland, MD;  Location: [facility];  Service: Urology;  Laterality: N/A;   CYSTOSCOPY WITH FULGERATION N/A 02/24/2019    Procedure: CYSTOSCOPY WITH FULGERATION;  Surgeon: Vanna Scotland, MD;  Location: [facility];  Service: Urology;  Laterality: N/A;  CYSTOSCOPY WITH RETROGRADE PYELOGRAM, URETEROSCOPY AND STENT PLACEMENT Left 11/24/2019    Procedure: CYSTOSCOPY WITH RETROGRADE PYELOGRAM, URETEROSCOPY;  Surgeon: Vanna Scotland, MD;  Location: [facility];  Service: Urology;  Laterality: Left;   CYSTOSCOPY WITH URETEROSCOPY AND STENT PLACEMENT Left 10/14/2018    Procedure: CYSTOSCOPY WITH URETEROSCOPY AND STENT PLACEMENT;  Surgeon: Vanna Scotland, MD;  Location: [facility];  Service: Urology;  Laterality: Left;   CYSTOSCOPY WITH URETEROSCOPY AND STENT PLACEMENT Left 02/24/2019    Procedure: CYSTOSCOPY WITH URETEROSCOPY AND STENT Exchange;  Surgeon: Vanna Scotland, MD;  Location: [facility];  Service: Urology;  Laterality: Left;   CYSTOSCOPY/URETEROSCOPY/HOLMIUM LASER/STENT PLACEMENT Left 11/11/2018    Procedure: CYSTOSCOPY/URETEROSCOPY//STENT EXCHANGE;  Surgeon: Vanna Scotland, MD;  Location: [facility];  Service: Urology;  Laterality: Left;   EYE SURGERY Left 2013   FOOT SURGERY Left 04/2013   HOLMIUM LASER APPLICATION Left 10/14/2018    Procedure: HOLMIUM LASER APPLICATION;  Surgeon: Vanna Scotland, MD;  Location: [facility];  Service: Urology;  Laterality: Left;   JOINT REPLACEMENT       KNEE ARTHROPLASTY Right 11/03/2015    Procedure: COMPUTER ASSISTED TOTAL KNEE ARTHROPLASTY;  Surgeon: Donato Heinz, MD;  Location: [facility];  Service: Orthopedics;  Laterality: Right;   MASTECTOMY Left 1986    positive   TOTAL KNEE ARTHROPLASTY Left 03/23/2021    Procedure: TOTAL KNEE ARTHROPLASTY;  Surgeon: Lyndle Herrlich, MD;  Location: [facility];  Service: Orthopedics;  Laterality: Left;   URETERAL BIOPSY Left 10/14/2018    Procedure: URETERAL BIOPSY;  Surgeon: Vanna Scotland, MD;  Location: [facility];  Service: Urology;  Laterality: Left;   URETERAL BIOPSY Left 02/24/2019    Procedure: URETERAL BIOPSY;  Surgeon: Vanna Scotland, MD;  Location: [facility];  Service: Urology;  Laterality: Left;   URETERAL BIOPSY Left 01/09/2022    Procedure: URETERAL BIOPSY/TUMOR ABLATION;  Surgeon: Vanna Scotland, MD;  Location: [facility];  Service: Urology;  Laterality: Left;   URETEROSCOPY Left 01/09/2022    Procedure: URETEROSCOPY;  Surgeon: Vanna Scotland, MD;  Location: [facility];  Service: Urology;  Laterality: Left;      Home Medications:  Allergies as of 01/13/2022         Reactions    Penicillin V Potassium Hives    Has patient had a PCN reaction causing immediate rash, facial/tongue/throat swelling, SOB or lightheadedness with hypotension: no Has patient had a PCN reaction causing severe rash involving mucus membranes or skin necrosis: {no Has patient had a PCN reaction that required hospitalization no Has patient had a PCN reaction occurring within the last 10 years: no If all of the above answers are "NO", then may proceed with Cephalosporin use.    Sulfa Antibiotics  Hives, Itching            Medication List           Accurate as of January 13, 2022  1:48 PM. If you have any questions, ask your nurse or doctor.              acetaminophen 500 MG tablet Commonly known as: TYLENOL Take 1,000 mg by mouth every 6 (six) hours as needed for moderate pain (pain.).    aspirin 81 MG chewable tablet Chew 1 tablet (81 mg total) by mouth 2 (two) times daily. What changed: when to take this    atorvastatin 40 MG tablet Commonly known as: LIPITOR Take 1 tablet by mouth once daily What changed: when to take this    CALCIUM+D3  PO Take 1 tablet by mouth daily.    cholecalciferol 25 MCG (1000 UNIT) tablet Commonly known as: VITAMIN D3 Take 1,000 Units by mouth at bedtime.    donepezil 5 MG tablet Commonly known as: ARICEPT Take 5 mg by mouth daily with lunch.    HYDROcodone-acetaminophen 5-325 MG tablet Commonly known as: NORCO/VICODIN Take 1-2 tablets by mouth every 6 (six) hours as needed for moderate pain.    hydrocortisone 2.5 % ointment Apply topically as needed (rash; hemorrhoids).    ketoconazole 2 % shampoo Commonly known as: NIZORAL Apply 1 application topically as needed (flaky/itchy scalp). Up to once per week    levETIRAcetam 500 MG tablet Commonly known as: KEPPRA Take 500 mg by mouth 2 (two) times daily.    metFORMIN 500 MG tablet Commonly known as: GLUCOPHAGE Take 1 tablet by mouth once daily with breakfast What changed: when to take this    multivitamin with minerals Tabs tablet Take 1 tablet by mouth daily at 2 PM.    nitrofurantoin (macrocrystal-monohydrate) 100 MG capsule Commonly known as: Macrobid Take 1 capsule (100 mg total) by mouth 2 (two) times daily.    nystatin cream Commonly known as: MYCOSTATIN Apply 1 application  topically daily as needed for dry skin (yeast infection).    oxybutynin 5 MG tablet Commonly known as: DITROPAN Take 1 tablet (5 mg total) by mouth every 8 (eight) hours as needed for  bladder spasms.    tamsulosin 0.4 MG Caps capsule Commonly known as: Flomax Take 1 capsule (0.4 mg total) by mouth daily.             Allergies:       Allergies  Allergen Reactions   Penicillin V Potassium Hives      Has patient had a PCN reaction causing immediate rash, facial/tongue/throat swelling, SOB or lightheadedness with hypotension: no Has patient had a PCN reaction causing severe rash involving mucus membranes or skin necrosis: {no Has patient had a PCN reaction that required hospitalization no Has patient had a PCN reaction occurring within the last 10 years: no If all of the above answers are "NO", then may proceed with Cephalosporin use.   Sulfa Antibiotics Hives and Itching      Family History:      Family History  Problem Relation Age of Onset   Glaucoma Mother     Kidney disease Mother     Arthritis Mother     Stroke Mother     Breast cancer Mother 39   Bladder Cancer Mother 76   Heart disease Father     Cancer Brother 34        lung cancer, smoker   Breast cancer Cousin          3 mat cousins dx late 61s   Breast cancer Cousin          1 mat cousin dx 45s-60s      Social History:  reports that she has never smoked. She has never used smokeless tobacco. She reports that she does not drink alcohol and does not use drugs.     Physical Exam: Ht 5\' 2"  (1.575 m)   Wt 203 lb (92.1 kg)   BMI 37.13 kg/m   Constitutional:  Alert and oriented, No acute distress. HEENT: [place] AT, moist mucus membranes.  Trachea midline, no masses. Cardiovascular: No clubbing, cyanosis, or edema. Respiratory: Normal respiratory effort, no increased work of breathing. GI: Abdomen is soft, nontender, nondistended, no abdominal masses GU: No  CVA tenderness Skin: No rashes, bruises or suspicious lesions. Neurologic: Grossly intact, no focal deficits, moving all 4 extremities. Psychiatric: Normal mood and affect.   Laboratory Data: Recent Labs       Lab Results  Component  Value Date    WBC 8.0 12/09/2021    HGB 13.3 12/09/2021    HCT 39.3 12/09/2021    MCV 90.3 12/09/2021    PLT 271 12/09/2021        Recent Labs       Lab Results  Component Value Date    CREATININE 0.63 12/09/2021          Recent Labs       Lab Results  Component Value Date    HGBA1C 5.9 (H) 12/09/2021        Urinalysis       Results for orders placed or performed during the hospital encounter of 01/09/22  Glucose, capillary  Result Value Ref Range    Glucose-Capillary 115 (H) 70 - 99 mg/dL  Glucose, capillary  Result Value Ref Range    Glucose-Capillary 111 (H) 70 - 99 mg/dL  Surgical pathology  Result Value Ref Range    SURGICAL PATHOLOGY          SURGICAL PATHOLOGY CASE: [id number] PATIENT: Martyn Malay Surgical Pathology Report         Specimen Submitted: A. Ureter, left; bx   Clinical History: Left hydronephrosis, Hx of left ureteral cancer.       DIAGNOSIS: A. URETER, LEFT; BIOPSY: - NONINVASIVE HIGH-GRADE UROTHELIAL CARCINOMA (WHO/ISUP). - NO MUSCULARIS PROPRIA IDENTIFIED.   GROSS DESCRIPTION: A. Labeled: Ureteral biopsy (left) Received: Fresh Collection time: 2:21 PM on 01/09/2022 Placed into formalin time: 3:35 PM on 01/09/2022 Tissue fragment(s): Multiple Size: Aggregate, 0.4 x 0.1 x 0.1 cm Description: Tan to brown soft tissue fragments Entirely submitted in 1 cassette.   CM 01/10/2022   Final Diagnosis performed by Katherine Mantle, MD.   Electronically signed 01/11/2022 11:28:17AM The electronic signature indicates that the named Attending Pathologist has evaluated the specimen Technical component performed at [facility], [address] [place], [place] [address] Lab: [phone] Dir: Jolene Schimke , MD, MMM  Professional component performed at [facility], [facility], [address] [place], [place], [place] [address] Lab: [phone] Dir: Beryle Quant, MD          Assessment & Plan:     1. 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  Risk of ureteroscopy reviewed again today including risk of bleeding, infection, demonstrating structures amongst others.  Will plan for biopsy if deemed necessary along with fulguration and stent exchange.  All questions were answered.

## 2022-06-26 NOTE — Transfer of Care (Signed)
Immediate Anesthesia Transfer of Care Note  [name]  Procedure(s) Performed: DIAGNOSTIC URETEROSCOPY (Left) CYSTOSCOPY WITH RETROGRADE PYELOGRAM/URETERAL STENT EXCHANGE VS REMOVAL (Left) CYSTOSCOPY WITH BLADDER AND/OR URETERAL BIOPSY (Left) BLADDER INSTILLATION OF GEMCITABINE (Left)  Patient Location: PACU  Anesthesia Type:General  Level of Consciousness: drowsy  Airway & Oxygen Therapy: Patient Spontanous Breathing and Patient connected to face mask oxygen  Post-op Assessment: Report given to RN and Post -op Vital signs reviewed and stable  Post vital signs: Reviewed and stable  Last Vitals:  Vitals Value Taken Time  BP 137/71 06/26/22 1105  Temp 35.8 1105  Pulse 57 06/26/22 1108  Resp 14 06/26/22 1108  SpO2 100 % 06/26/22 1108  Vitals shown include unvalidated device data.  Last Pain:  Vitals:   06/26/22 0925  TempSrc: Temporal  PainSc: 0-No pain         Complications: No notable events documented.

## 2022-06-26 NOTE — Op Note (Signed)
Date of procedure: 06/26/22  Preoperative diagnosis:  History of left distal ureteral transitional cell carcinoma, high-grade  Postoperative diagnosis:  Same as above  Procedure: Diagnostic left ureteroscopy Left distal ureteral biopsy Left retrograde pyelogram Left ureteral stent exchange Cystoscopy  Interpretation of fluoroscopy less than 30 minutes  Surgeon: Vanna Scotland, MD  Anesthesia: General  Complications: None  Intraoperative findings: Bullous edema surrounding left UO.  Distal coil of stent was completely encrusted, stent was obstructed.  The in the distal ureter, there is area of narrowing and what appeared to be either bullous/inflamed tissue versus persistent tumor.  Multiple biopsies were taken.  Stent was replaced.  EBL: Minimal  Specimens: Left distal ureteral biopsy  Drains: 6 x 24 French double-J ureteral stent, Bard Optima without tether  Indication: Elizabeth Trujillo is a 87 y.o. 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  After reviewing the management options for treatment, hse elected to proceed with the above surgical procedure(s). We have discussed the potential benefits and risks of the procedure, side effects of the proposed treatment, the likelihood of the patient achieving the goals of the procedure, and any potential problems that might occur during the procedure or recuperation. Informed consent has been obtained.  Description of procedure:  The patient was taken to the operating room and general anesthesia was induced.  The patient was placed in the dorsal lithotomy position, prepped and draped in the usual sterile fashion, and preoperative antibiotics were administered. A preoperative time-out was performed.   21 [place] scope was advanced per urethra into the bladder.  Notably, there was a large amount of  bullous edema and inflammation around the left UO.  The distal coil was encrusted with stone material.  A grasper was used to grasp this and bring it to the level of the meatus.  I had to amputate the distal coil of the stent off in order to cannulated with a sensor wire.  Under fluoroscopy however, I was then able to advance the wire beyond the proximal coil now at the level of the mid ureter and straightened.  This was consistent with obstructed stent.  I ended up removing the stent.  The scope was then replaced back into the bladder.  Attention was turned to the left UO.  It was intubated using a 5 [place] open-ended ureteral catheter.  A gentle retrograde pyelogram was performed on the side.  There was initially filling defect within the left distal ureter and after advancing the open-ended somewhat, I did get it to fill however there is a significant caliber change of the first 4 to 5 cm of the left most distal ureter as compared to the proximal.  Proximal to this with a transition just below the iliacs, the ureter was dilated and there was moderate hydronephrosis.  There were no filling defects in the upper tract.  A sensor wire was then placed up to the level of the kidney and snapped in place as a safety wire.  A semirigid ureteroscope was then advanced through the UO and about 2 cm in, there is an area which was not easily traversable with some very full tissue, either bullous edema which is very reactive or tumor.  It is not papillary or frondular in nature.  Ended up using a second wire and a railroad technique to bypass this and was able to advance beyond  this to normal, dilated appearing ureter.  The scope was able to be advanced all the way up to the renal pelvis and no pathology was identified.  I then backed the scope back down to the area of abnormality.  Using [place] forceps, I was able to take 5 or 6 large biopsies of all of the abnormal areas of sampling.  I did not elect to fulgurate as it was  unclear to me whether this represented inflammatory, edematous change versus recurrent malignancy.  There is also a few areas where there was calcification along the wall which was dislodged without any obvious underlying papillary tumor.  Ultimately, I replaced a 6 x 24 French Bard Optima stent.  A full coil was noted within the renal pelvis as well as within the bladder once it was deployed.  I reinspected the bladder and did not find any significant pathology that was suspicious for malignancy thus no additional bladder biopsies were performed.  It did place a 41 French Foley catheter this point in time.  The patient was then cleaned and dried, repositioned in supine position, worsen anesthesia and taken to the PACU in stable condition.  As a precaution in case this did represent malignancy, I did go ahead and instill gemcitabine, 2000 mg of intravesical solution into the bladder.  This was allowed to dwell for 1 hour.  At the end of an hour, was drained and the Foley was removed.  Plan: I will see her in clinic in about 10 days to discuss pathology results and plan for the stent based on the pathology.  Vanna Scotland, M.D.

## 2022-06-26 NOTE — Anesthesia Procedure Notes (Signed)
Procedure Name: LMA Insertion Date/Time: 06/26/2022 10:11 AM  Performed by: Morene Crocker, CRNAPre-anesthesia Checklist: Patient identified, Patient being monitored, Timeout performed, Emergency Drugs available and Suction available Patient Re-evaluated:Patient Re-evaluated prior to induction Oxygen Delivery Method: Circle system utilized Preoxygenation: Pre-oxygenation with 100% oxygen Induction Type: IV induction Ventilation: Mask ventilation without difficulty LMA: LMA inserted LMA Size: 4.0 Tube type: Oral Number of attempts: 1 Placement Confirmation: positive ETCO2 and breath sounds checked- equal and bilateral Tube secured with: Tape Dental Injury: Teeth and Oropharynx as per pre-operative assessment  Comments: Smooth atraumatic placement of LMA

## 2022-06-26 NOTE — Anesthesia Preprocedure Evaluation (Signed)
Anesthesia Evaluation  Patient identified by MRN, date of birth, ID band Patient awake    Reviewed: Allergy & Precautions, NPO status , Patient's Chart, lab work & pertinent test results  History of Anesthesia Complications Negative for: history of anesthetic complications  Airway Mallampati: III  TM Distance: <3 FB Neck ROM: full    Dental  (+) Chipped, Dental Advidsory Given   Pulmonary neg pulmonary ROS, neg shortness of breath, neg recent URI   Pulmonary exam normal        Cardiovascular Exercise Tolerance: Good hypertension, (-) angina (-) Past MI Normal cardiovascular exam(-) dysrhythmias      Neuro/Psych Seizures -,  TIACVA, Residual Symptoms  negative psych ROS   GI/Hepatic Neg liver ROS,GERD  Controlled,,  Endo/Other  negative endocrine ROSdiabetes    Renal/GU      Musculoskeletal   Abdominal   Peds  Hematology negative hematology ROS (+)   Anesthesia Other Findings Past Medical History: No date: Anemia No date: Arthritis 1986: Breast cancer (HCC)     Comment:  left mastectomy 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     Comment:  BCG tx per pt No date: Collagen vascular disease (HCC)     Comment:  RA  No date: Diabetes mellitus without complication (HCC) No date: Elevated triglycerides with high cholesterol No date: Family history of bladder cancer No date: Family history of breast cancer No date: Family history of lung cancer No date: GERD (gastroesophageal reflux disease)     Comment:  history of reflux No date: History of cancer of ureter No date: Hypercholesterolemia No date: Hypertension     Comment:  history of  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, left (HCC)     Comment:  Surgical resection and BCG  tx per pt No date: Seizure (HCC)     Comment:  last seizure was early 2023 No date: Stroke [facility])     Comment:  TIA-possible seizure instead of tia per neurologist No date:  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HCC)     Comment:  Left Ureter No date: UTI (urinary tract infection)  Past Surgical History: 09/1984: ABDOMINAL HYSTERECTOMY     Comment:  TAH/BSO 11/11/2018: BIOPSY; Left     Comment:  Procedure: BIOPSY;  Surgeon: Vanna Scotland, MD;                Location: [facility];  Service: Urology;  Laterality: Left; 01/23/2013: BREAST BIOPSY; Right     Comment:  [place] bx/clip-neg 07/1984: BREAST SURGERY; Left     Comment:  Mastectomy No date: callus removed from left toe 2015: CATARACT EXTRACTION W/ INTRAOCULAR LENS IMPLANT; Left No date: COLONOSCOPY 02/24/2019: CYSTOSCOPY W/ RETROGRADES; Left     Comment:  Procedure: CYSTOSCOPY WITH RETROGRADE PYELOGRAM;                Surgeon: Vanna Scotland, MD;  Location: [facility];                Service: Urology;  Laterality: Left; 02/24/2019: CYSTOSCOPY WITH BIOPSY; N/A     Comment:  Procedure: CYSTOSCOPY WITH Bladder BIOPSY;  Surgeon:               Vanna Scotland, MD;  Location: [facility];  Service:               Urology;  Laterality: N/A; 02/24/2019: CYSTOSCOPY WITH FULGERATION; N/A     Comment:  Procedure: CYSTOSCOPY WITH FULGERATION;  Surgeon:  Vanna Scotland, MD;  Location: [facility];  Service:               Urology;  Laterality: N/A; 11/24/2019: CYSTOSCOPY WITH RETROGRADE PYELOGRAM, URETEROSCOPY AND  STENT PLACEMENT; Left     Comment:  Procedure: CYSTOSCOPY WITH RETROGRADE PYELOGRAM,               URETEROSCOPY;  Surgeon: Vanna Scotland, MD;  Location:               [facility];  Service: Urology;  Laterality: Left; 10/14/2018: CYSTOSCOPY WITH URETEROSCOPY AND STENT PLACEMENT; Left     Comment:  Procedure: CYSTOSCOPY WITH URETEROSCOPY AND STENT               PLACEMENT;  Surgeon: Vanna Scotland, MD;  Location: [facility]              [facility];  Service: Urology;  Laterality: Left; 02/24/2019: CYSTOSCOPY WITH URETEROSCOPY AND STENT PLACEMENT; Left     Comment:  Procedure: CYSTOSCOPY WITH URETEROSCOPY AND STENT                Exchange;  Surgeon: Vanna Scotland, MD;  Location: [facility]               [facility];  Service: Urology;  Laterality: Left; 11/11/2018: CYSTOSCOPY/URETEROSCOPY/HOLMIUM LASER/STENT PLACEMENT;  Left     Comment:  Procedure: CYSTOSCOPY/URETEROSCOPY//STENT EXCHANGE;                Surgeon: Vanna Scotland, MD;  Location: [facility];                Service: Urology;  Laterality: Left; 2013: EYE SURGERY; Left 04/2013: FOOT SURGERY; Left 10/14/2018: HOLMIUM LASER APPLICATION; Left     Comment:  Procedure: HOLMIUM LASER APPLICATION;  Surgeon: Vanna Scotland, MD;  Location: [facility];  Service: Urology;                Laterality: Left; No date: JOINT REPLACEMENT 11/03/2015: KNEE ARTHROPLASTY; Right     Comment:  Procedure: COMPUTER ASSISTED TOTAL KNEE ARTHROPLASTY;                Surgeon: Donato Heinz, MD;  Location: [facility];                Service: Orthopedics;  Laterality: Right; 1986: MASTECTOMY; Left     Comment:  positive 03/23/2021: TOTAL KNEE ARTHROPLASTY; Left     Comment:  Procedure: TOTAL KNEE ARTHROPLASTY;  Surgeon: Lyndle Herrlich, MD;  Location: [facility];  Service: Orthopedics;               Laterality: Left; 10/14/2018: URETERAL BIOPSY; Left     Comment:  Procedure: URETERAL BIOPSY;  Surgeon: Vanna Scotland,               MD;  Location: [facility];  Service: Urology;  Laterality:               Left; 02/24/2019: URETERAL BIOPSY; Left     Comment:  Procedure: URETERAL BIOPSY;  Surgeon: Vanna Scotland,               MD;  Location: [facility];  Service: Urology;  Laterality:               Left;     Reproductive/Obstetrics negative OB ROS  Anesthesia Physical Anesthesia Plan  ASA: 3  Anesthesia Plan: General   Post-op Pain Management: Minimal or no pain anticipated   Induction: Intravenous  PONV Risk Score and Plan: Ondansetron, Dexamethasone and Treatment may vary due to age or medical  condition  Airway Management Planned: LMA  Additional Equipment:   Intra-op Plan:   Post-operative Plan: Extubation in OR  Informed Consent: I have reviewed the patients History and Physical, chart, labs and discussed the procedure including the risks, benefits and alternatives for the proposed anesthesia with the patient or authorized representative who has indicated his/her understanding and acceptance.     Dental Advisory Given  Plan Discussed with: Anesthesiologist, CRNA and Surgeon  Anesthesia Plan Comments: (Patient consented for risks of anesthesia including but not limited to:  - adverse reactions to medications - damage to eyes, teeth, lips or other oral mucosa - nerve damage due to positioning  - sore throat or hoarseness - Damage to heart, brain, nerves, lungs, other parts of body or loss of life  Patient voiced understanding.)        Anesthesia Quick Evaluation

## 2022-06-26 NOTE — Discharge Instructions (Signed)

## 2022-06-27 ENCOUNTER — Encounter: Payer: Self-pay | Admitting: Urology

## 2022-06-27 LAB — SURGICAL PATHOLOGY

## 2022-06-28 NOTE — Anesthesia Postprocedure Evaluation (Signed)
Anesthesia Post Note  [name]  Procedure(s) Performed: DIAGNOSTIC URETEROSCOPY (Left: Ureter) CYSTOSCOPY WITH RETROGRADE PYELOGRAM/URETERAL STENT EXCHANGE (Left) URETERAL BIOPSY (Left) BLADDER INSTILLATION OF GEMCITABINE (Left)  Patient location during evaluation: PACU Anesthesia Type: General Level of consciousness: awake and alert Pain management: pain level controlled Vital Signs Assessment: post-procedure vital signs reviewed and stable Respiratory status: spontaneous breathing, nonlabored ventilation, respiratory function stable and patient connected to nasal cannula oxygen Cardiovascular status: blood pressure returned to baseline and stable Postop Assessment: no apparent nausea or vomiting Anesthetic complications: no   No notable events documented.   Last Vitals:  Vitals:   06/26/22 1241 06/26/22 1258  BP: (!) 143/75 (!) 145/66  Pulse: 61 (!) 57  Resp: 18 16  Temp: (!) 36.4 C (!) 36.2 C  SpO2: 99% 100%    Last Pain:  Vitals:   06/26/22 1258  TempSrc: Temporal  PainSc: 4                  Lenard Simmer

## 2022-07-11 ENCOUNTER — Ambulatory Visit (INDEPENDENT_AMBULATORY_CARE_PROVIDER_SITE_OTHER): Payer: Medicare Other | Admitting: Urology

## 2022-07-11 VITALS — BP 131/83 | HR 85 | Ht 62.0 in | Wt 207.0 lb

## 2022-07-11 DIAGNOSIS — G8918 Other acute postprocedural pain: Secondary | ICD-10-CM

## 2022-07-11 DIAGNOSIS — R350 Frequency of micturition: Secondary | ICD-10-CM | POA: Diagnosis not present

## 2022-07-11 DIAGNOSIS — C662 Malignant neoplasm of left ureter: Secondary | ICD-10-CM

## 2022-07-11 DIAGNOSIS — Z8554 Personal history of malignant neoplasm of ureter: Secondary | ICD-10-CM | POA: Diagnosis not present

## 2022-07-11 DIAGNOSIS — Z466 Encounter for fitting and adjustment of urinary device: Secondary | ICD-10-CM

## 2022-07-11 MED ORDER — CEFTRIAXONE SODIUM 1 G IJ SOLR
1.0000 g | INTRAMUSCULAR | Status: DC
Start: 2022-07-11 — End: 2023-04-06
  Administered 2022-07-11: 1 g via INTRAMUSCULAR

## 2022-07-11 NOTE — Progress Notes (Signed)
I, Amy L Pierron,acting as a scribe for Vanna Scotland, MD.,have documented all relevant documentation on the behalf of Vanna Scotland, MD,as directed by  Vanna Scotland, MD while in the presence of Vanna Scotland, MD.  07/11/2022 1:43 PM   Elizabeth Trujillo 1935-05-19 [id number]  Referring provider: Bosie Clos, MD [address] [place],  [place] [address]  Chief Complaint  Patient presents with   Results    Pathology report follow up    HPI: Complex 87 year-old female with a personal history of high grade left distal urothelial carcinoma returns today for pathology follow-up.  She is being managed conservatively with local ablative therapy and most recently BCG with stent in place. She returned to the operating room of 06/26/22 for diagnostic ureteroscopy and biopsies. Left distal ureter appeared to be diffusely inflamed with either inflamed tissue versus persistent tumor. Stent was replaced. Surgical pathology was consistent with rare small areas of atypical urethelium within ulceration and poorly preserved tissue.   She is accompanied by her children who reported that she had a lot of post-operative pain for five days. She reports urinary frequency as well.  She is still taking the Oxybutynin. Her pain has lessened the past week or so and she has not needed any pain medication. She also reports some pain at her clitoris.   PMH: Past Medical History:  Diagnosis Date   Anemia    Arthritis    Breast cancer (HCC) 1986   left mastectomy   carcinoma in situ of urinary bladder 2019   BCG tx per pt   Collagen vascular disease (HCC)    RA    Diabetes mellitus without complication (HCC)    Elevated triglycerides with high cholesterol    Family history of bladder cancer    Family history of breast cancer    Family history of lung cancer    GERD (gastroesophageal reflux disease)    history of reflux   History of cancer of ureter    Hypercholesterolemia    Hypertension     history of    MCI (mild cognitive impairment)    Numbness and tingling in left hand    Primary ureteral papillary carcinoma, left (HCC) 2019   Surgical resection and BCG  tx per pt   Seizure (HCC)    last seizure was early 2023   Seizure-like activity (HCC)    Stroke (HCC)    TIA-possible seizure instead of tia per neurologist   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HCC)    Left Ureter   UTI (urinary tract infection)    White matter disease     Surgical History: Past Surgical History:  Procedure Laterality Date   ABDOMINAL HYSTERECTOMY  09/1984   TAH/BSO   BIOPSY Left 11/11/2018   Procedure: BIOPSY;  Surgeon: Vanna Scotland, MD;  Location: [facility];  Service: Urology;  Laterality: Left;   BLADDER INSTILLATION N/A 01/23/2022   Procedure: BLADDER INSTILLATION OF GEMCITABINE;  Surgeon: Vanna Scotland, MD;  Location: [facility];  Service: Urology;  Laterality: N/A;   BLADDER INSTILLATION Left 06/26/2022   Procedure: BLADDER INSTILLATION OF GEMCITABINE;  Surgeon: Vanna Scotland, MD;  Location: [facility];  Service: Urology;  Laterality: Left;   BREAST BIOPSY Right 01/23/2013   [place] bx/clip-neg   BREAST SURGERY Left 07/1984   Mastectomy   callus removed from left toe     CATARACT EXTRACTION W/ INTRAOCULAR LENS IMPLANT Left 2015   COLONOSCOPY     CYSTOSCOPY W/ RETROGRADES Left 02/24/2019   Procedure:  CYSTOSCOPY WITH RETROGRADE PYELOGRAM;  Surgeon: Vanna Scotland, MD;  Location: [facility];  Service: Urology;  Laterality: Left;   CYSTOSCOPY W/ RETROGRADES Left 01/23/2022   Procedure: CYSTOSCOPY WITH RETROGRADE PYELOGRAM;  Surgeon: Vanna Scotland, MD;  Location: [facility];  Service: Urology;  Laterality: Left;   CYSTOSCOPY W/ URETERAL STENT PLACEMENT Left 01/09/2022   Procedure: CYSTOSCOPY WITH RETROGRADE PYELOGRAM/URETERAL STENT PLACEMENT;  Surgeon: Vanna Scotland, MD;  Location: [facility];  Service: Urology;  Laterality: Left;   CYSTOSCOPY W/ URETERAL STENT PLACEMENT Left 01/23/2022   Procedure:  CYSTOSCOPY WITH STENT EXCHANGE;  Surgeon: Vanna Scotland, MD;  Location: [facility];  Service: Urology;  Laterality: Left;   CYSTOSCOPY W/ URETERAL STENT PLACEMENT Left 06/26/2022   Procedure: CYSTOSCOPY WITH RETROGRADE PYELOGRAM/URETERAL STENT EXCHANGE;  Surgeon: Vanna Scotland, MD;  Location: [facility];  Service: Urology;  Laterality: Left;   CYSTOSCOPY WITH BIOPSY N/A 02/24/2019   Procedure: CYSTOSCOPY WITH Bladder BIOPSY;  Surgeon: Vanna Scotland, MD;  Location: [facility];  Service: Urology;  Laterality: N/A;   CYSTOSCOPY WITH BIOPSY Left 06/26/2022   Procedure: URETERAL BIOPSY;  Surgeon: Vanna Scotland, MD;  Location: [facility];  Service: Urology;  Laterality: Left;   CYSTOSCOPY WITH FULGERATION N/A 02/24/2019   Procedure: CYSTOSCOPY WITH FULGERATION;  Surgeon: Vanna Scotland, MD;  Location: [facility];  Service: Urology;  Laterality: N/A;   CYSTOSCOPY WITH RETROGRADE PYELOGRAM, URETEROSCOPY AND STENT PLACEMENT Left 11/24/2019   Procedure: CYSTOSCOPY WITH RETROGRADE PYELOGRAM, URETEROSCOPY;  Surgeon: Vanna Scotland, MD;  Location: [facility];  Service: Urology;  Laterality: Left;   CYSTOSCOPY WITH URETEROSCOPY AND STENT PLACEMENT Left 10/14/2018   Procedure: CYSTOSCOPY WITH URETEROSCOPY AND STENT PLACEMENT;  Surgeon: Vanna Scotland, MD;  Location: [facility];  Service: Urology;  Laterality: Left;   CYSTOSCOPY WITH URETEROSCOPY AND STENT PLACEMENT Left 02/24/2019   Procedure: CYSTOSCOPY WITH URETEROSCOPY AND STENT Exchange;  Surgeon: Vanna Scotland, MD;  Location: [facility];  Service: Urology;  Laterality: Left;   CYSTOSCOPY/URETEROSCOPY/HOLMIUM LASER Left 01/23/2022   Procedure: CYSTOSCOPY/URETEROSCOPY/HOLMIUM LASER ABLATION OF URETERAL TUMOR;  Surgeon: Vanna Scotland, MD;  Location: [facility];  Service: Urology;  Laterality: Left;   CYSTOSCOPY/URETEROSCOPY/HOLMIUM LASER/STENT PLACEMENT Left 11/11/2018   Procedure: CYSTOSCOPY/URETEROSCOPY//STENT EXCHANGE;  Surgeon: Vanna Scotland, MD;  Location:  [facility];  Service: Urology;  Laterality: Left;   EYE SURGERY Left 2013   cataracts   FOOT SURGERY Left 04/2013   HOLMIUM LASER APPLICATION Left 10/14/2018   Procedure: HOLMIUM LASER APPLICATION;  Surgeon: Vanna Scotland, MD;  Location: [facility];  Service: Urology;  Laterality: Left;   JOINT REPLACEMENT     KNEE ARTHROPLASTY Right 11/03/2015   Procedure: COMPUTER ASSISTED TOTAL KNEE ARTHROPLASTY;  Surgeon: Donato Heinz, MD;  Location: [facility];  Service: Orthopedics;  Laterality: Right;   MASTECTOMY Left 1986   positive   TOTAL KNEE ARTHROPLASTY Left 03/23/2021   Procedure: TOTAL KNEE ARTHROPLASTY;  Surgeon: Lyndle Herrlich, MD;  Location: [facility];  Service: Orthopedics;  Laterality: Left;   URETERAL BIOPSY Left 10/14/2018   Procedure: URETERAL BIOPSY;  Surgeon: Vanna Scotland, MD;  Location: [facility];  Service: Urology;  Laterality: Left;   URETERAL BIOPSY Left 02/24/2019   Procedure: URETERAL BIOPSY;  Surgeon: Vanna Scotland, MD;  Location: [facility];  Service: Urology;  Laterality: Left;   URETERAL BIOPSY Left 01/09/2022   Procedure: URETERAL BIOPSY/TUMOR ABLATION;  Surgeon: Vanna Scotland, MD;  Location: [facility];  Service: Urology;  Laterality: Left;   URETEROSCOPY Left 01/09/2022   Procedure: URETEROSCOPY;  Surgeon: Vanna Scotland, MD;  Location: [facility];  Service: Urology;  Laterality: Left;   URETEROSCOPY Left 06/26/2022   Procedure: DIAGNOSTIC URETEROSCOPY;  Surgeon: Vanna Scotland, MD;  Location: [facility];  Service: Urology;  Laterality: Left;    Home Medications:  Allergies as of 07/11/2022       Reactions   Penicillin V Potassium Hives   Has patient had a PCN reaction causing immediate rash, facial/tongue/throat swelling, SOB or lightheadedness with hypotension: no Has patient had a PCN reaction causing severe rash involving mucus membranes or skin necrosis: {no Has patient had a PCN reaction that required hospitalization no Has patient had a PCN reaction occurring  within the last 10 years: no If all of the above answers are "NO", then may proceed with Cephalosporin use.   Sulfa Antibiotics Hives, Itching        Medication List        Accurate as of July 11, 2022  1:43 PM. If you have any questions, ask your nurse or doctor.          acetaminophen 500 MG tablet Commonly known as: TYLENOL Take 1,000 mg by mouth every 6 (six) hours as needed for moderate pain (pain.).   aspirin 81 MG chewable tablet Chew 1 tablet (81 mg total) by mouth 2 (two) times daily.   atorvastatin 40 MG tablet Commonly known as: LIPITOR Take 1 tablet by mouth once daily   CALCIUM+D3 PO Take 1 tablet by mouth daily.   cholecalciferol 25 MCG (1000 UNIT) tablet Commonly known as: VITAMIN D3 Take 1,000 Units by mouth at bedtime.   donepezil 5 MG tablet Commonly known as: ARICEPT Take 5 mg by mouth daily with lunch.   HYDROcodone-acetaminophen 5-325 MG tablet Commonly known as: NORCO/VICODIN Take 1-2 tablets by mouth every 6 (six) hours as needed for moderate pain.   hydrocortisone 2.5 % ointment Apply topically as needed (rash; hemorrhoids).   ibuprofen 400 MG tablet Commonly known as: ADVIL Take 400 mg by mouth every 6 (six) hours as needed.   ketoconazole 2 % shampoo Commonly known as: NIZORAL Apply 1 application topically as needed (flaky/itchy scalp). Up to once per week   levETIRAcetam 500 MG tablet Commonly known as: KEPPRA Take 500 mg by mouth 2 (two) times daily.   metFORMIN 500 MG tablet Commonly known as: GLUCOPHAGE Take 1 tablet (500 mg total) by mouth daily with breakfast.   multivitamin with minerals Tabs tablet Take 1 tablet by mouth daily at 2 PM.   nystatin cream Commonly known as: MYCOSTATIN APPLY  CREAM TOPICALLY ONCE DAILY AS NEEDED FOR  DRY  SKIN  (YEAST  INFECTION)   oxybutynin 5 MG tablet Commonly known as: DITROPAN Take 1 tablet (5 mg total) by mouth every 8 (eight) hours as needed for bladder spasms.   tamsulosin  0.4 MG Caps capsule Commonly known as: Flomax Take 1 capsule (0.4 mg total) by mouth daily.        Allergies:  Allergies  Allergen Reactions   Penicillin V Potassium Hives    Has patient had a PCN reaction causing immediate rash, facial/tongue/throat swelling, SOB or lightheadedness with hypotension: no Has patient had a PCN reaction causing severe rash involving mucus membranes or skin necrosis: {no Has patient had a PCN reaction that required hospitalization no Has patient had a PCN reaction occurring within the last 10 years: no If all of the above answers are "NO", then may proceed with Cephalosporin use.   Sulfa Antibiotics Hives and Itching    Family History:  Family History  Problem Relation Age of Onset   Glaucoma Mother    Kidney disease Mother    Arthritis Mother    Stroke Mother    Breast cancer Mother 54   Bladder Cancer Mother 23   Heart disease Father    Cancer Brother 41       lung cancer, smoker   Breast cancer Cousin        3 mat cousins dx late 7s   Breast cancer Cousin        1 mat cousin dx 27s-60s    Social History:  reports that she has never smoked. She has never been exposed to tobacco smoke. She has never used smokeless tobacco. She reports that she does not drink alcohol and does not use drugs.   Physical Exam: BP 131/83   Pulse 85   Ht 5\' 2"  (1.575 m)   Wt 207 lb (93.9 kg)   BMI 37.86 kg/m   Constitutional:  Alert and oriented, No acute distress. HEENT: [place] AT, moist mucus membranes.  Trachea midline, no masses. Neurologic: Grossly intact, no focal deficits, moving all 4 extremities. Psychiatric: Normal mood and affect.   Assessment & Plan:    Left distal urethelial carcinoma  -  Pathology / intra-op findings likely consistent with previously fulgurated ablative ulcerated tissue. There's no obvious viable tumor residual, at least on biopsy. Visually, interoperative findings could be consistent with previous laser ablation and BCG  changes.  - Favor removing the stent today. Gave a dose of anti-biotics in office.   - Plan for repeat CT urogram in three months and consider repeat diagnostic ureteroscopy if her imaging is abnormal. Also plan for urine cytology.  - Suggested she and her family not put too much emphasis on the cause of her pain because it is difficult to exactly pinpoint why she is having it.  We can simply focus on managing her pain as it comes.  It is likely that she will have some left flank discomfort for up to a few weeks after the stent is removed.  - Discontinue the Oxybutynin. Explained if she gets a fever of 101 or higher to go to the ER. -We also discussed that she may develop scar tissue in her left ureter persistent residual postoperative changes which may make interpretation of CT scan difficult. -As per previous conversations, she and her family are not interested in more aggressive treatment options including nephro ureterectomy and or chemotherapy.  Return in about 3 months (around 10/10/2022) for CT urogram.  I have reviewed the above documentation for accuracy and completeness, and I agree with the above.   Vanna Scotland, MD    [facility] [address] [place], [place] [address] [phone]  I spent 35 total minutes on the day of the encounter including pre-visit review of the medical record, face-to-face time with the patient, and post visit ordering of labs/imaging/tests.  This time was in addition to stent removal which was a separate unrelated issue.

## 2022-07-11 NOTE — Progress Notes (Signed)
   07/11/22  CC:  Chief Complaint  Patient presents with   Results    Pathology report follow up    HPI: 87 year old female who presents today for stent removal after discussing surgical pathology and plan.  Blood pressure 131/83, pulse 85, height 5\' 2"  (1.575 m), weight 207 lb (93.9 kg). NED. A&Ox3.   No respiratory distress   Abd soft, NT, ND Normal external genitalia with patent urethral meatus  1 g of IM ceftriaxone was given today for prophylaxis.  Cystoscopy/ Stent removal procedure  Patient identification was confirmed, informed consent was obtained, and patient was prepped using Betadine solution.  Lidocaine jelly was administered per urethral meatus.    Preoperative abx where received prior to procedure.    Procedure: - Flexible cystoscope introduced, without any difficulty.   - Thorough search of the bladder revealed:    normal urethral meatus  Stent seen emanating from left ureteral orifice, grasped with stent graspers, and removed in entirety.     Post-Procedure: - Patient tolerated the procedure well   Post-Procedure: - Patient tolerated the procedure well   Vanna Scotland, MD

## 2022-08-02 DIAGNOSIS — E7800 Pure hypercholesterolemia, unspecified: Secondary | ICD-10-CM | POA: Diagnosis not present

## 2022-08-02 DIAGNOSIS — E119 Type 2 diabetes mellitus without complications: Secondary | ICD-10-CM | POA: Diagnosis not present

## 2022-08-02 DIAGNOSIS — Z6837 Body mass index (BMI) 37.0-37.9, adult: Secondary | ICD-10-CM | POA: Diagnosis not present

## 2022-08-02 DIAGNOSIS — Z8554 Personal history of malignant neoplasm of ureter: Secondary | ICD-10-CM | POA: Diagnosis not present

## 2022-08-02 DIAGNOSIS — Z853 Personal history of malignant neoplasm of breast: Secondary | ICD-10-CM | POA: Diagnosis not present

## 2022-08-02 DIAGNOSIS — Z1231 Encounter for screening mammogram for malignant neoplasm of breast: Secondary | ICD-10-CM | POA: Diagnosis not present

## 2022-08-02 DIAGNOSIS — Z96651 Presence of right artificial knee joint: Secondary | ICD-10-CM | POA: Diagnosis not present

## 2022-08-08 DIAGNOSIS — E538 Deficiency of other specified B group vitamins: Secondary | ICD-10-CM | POA: Diagnosis not present

## 2022-08-08 DIAGNOSIS — R5382 Chronic fatigue, unspecified: Secondary | ICD-10-CM | POA: Diagnosis not present

## 2022-08-08 DIAGNOSIS — R569 Unspecified convulsions: Secondary | ICD-10-CM | POA: Diagnosis not present

## 2022-08-08 DIAGNOSIS — G3184 Mild cognitive impairment, so stated: Secondary | ICD-10-CM | POA: Diagnosis not present

## 2022-08-14 ENCOUNTER — Other Ambulatory Visit: Payer: Self-pay | Admitting: Urology

## 2022-08-14 DIAGNOSIS — B372 Candidiasis of skin and nail: Secondary | ICD-10-CM

## 2022-08-29 ENCOUNTER — Other Ambulatory Visit: Payer: Self-pay | Admitting: Family Medicine

## 2022-08-29 DIAGNOSIS — Z1231 Encounter for screening mammogram for malignant neoplasm of breast: Secondary | ICD-10-CM

## 2022-09-02 DIAGNOSIS — H1131 Conjunctival hemorrhage, right eye: Secondary | ICD-10-CM | POA: Diagnosis not present

## 2022-09-20 ENCOUNTER — Ambulatory Visit
Admission: RE | Admit: 2022-09-20 | Discharge: 2022-09-20 | Disposition: A | Discharge status: Home / Self Care | Payer: Medicare Other | Source: Ambulatory Visit | Attending: Urology | Admitting: Urology

## 2022-09-20 ENCOUNTER — Ambulatory Visit
Admission: RE | Admit: 2022-09-20 | Discharge: 2022-09-20 | Disposition: A | Discharge status: Home / Self Care | Payer: Medicare Other | Source: Ambulatory Visit | Attending: Family Medicine | Admitting: Family Medicine

## 2022-09-20 DIAGNOSIS — C662 Malignant neoplasm of left ureter: Secondary | ICD-10-CM | POA: Diagnosis not present

## 2022-09-20 DIAGNOSIS — Z1231 Encounter for screening mammogram for malignant neoplasm of breast: Secondary | ICD-10-CM | POA: Diagnosis not present

## 2022-09-20 DIAGNOSIS — N131 Hydronephrosis with ureteral stricture, not elsewhere classified: Secondary | ICD-10-CM | POA: Diagnosis not present

## 2022-09-20 DIAGNOSIS — C679 Malignant neoplasm of bladder, unspecified: Secondary | ICD-10-CM | POA: Diagnosis not present

## 2022-09-20 DIAGNOSIS — Z466 Encounter for fitting and adjustment of urinary device: Secondary | ICD-10-CM | POA: Insufficient documentation

## 2022-09-20 DIAGNOSIS — K449 Diaphragmatic hernia without obstruction or gangrene: Secondary | ICD-10-CM | POA: Diagnosis not present

## 2022-09-20 DIAGNOSIS — K5730 Diverticulosis of large intestine without perforation or abscess without bleeding: Secondary | ICD-10-CM | POA: Diagnosis not present

## 2022-09-20 LAB — POCT I-STAT CREATININE: Creatinine, Ser: 0.7 mg/dL (ref 0.44–1.00)

## 2022-09-20 MED ORDER — IOHEXOL 300 MG/ML  SOLN
125.0000 mL | Freq: Once | INTRAMUSCULAR | Status: AC | PRN
  Administered 2022-09-20: 125 mL via INTRAVENOUS

## 2022-09-20 MED ORDER — SODIUM CHLORIDE 0.9 % IV SOLN: INTRAVENOUS | Status: DC

## 2022-09-22 ENCOUNTER — Ambulatory Visit: Payer: Medicare Other

## 2022-10-13 ENCOUNTER — Ambulatory Visit: Payer: Medicare Other | Admitting: Urology

## 2022-10-13 ENCOUNTER — Ambulatory Visit (INDEPENDENT_AMBULATORY_CARE_PROVIDER_SITE_OTHER): Payer: Medicare Other | Admitting: Urology

## 2022-10-13 VITALS — BP 126/80 | HR 73 | Ht 62.0 in | Wt 214.2 lb

## 2022-10-13 DIAGNOSIS — C662 Malignant neoplasm of left ureter: Secondary | ICD-10-CM | POA: Diagnosis not present

## 2022-10-13 DIAGNOSIS — Z87440 Personal history of urinary (tract) infections: Secondary | ICD-10-CM

## 2022-10-13 DIAGNOSIS — N390 Urinary tract infection, site not specified: Secondary | ICD-10-CM

## 2022-10-13 DIAGNOSIS — D090 Carcinoma in situ of bladder: Secondary | ICD-10-CM | POA: Diagnosis not present

## 2022-10-13 MED ORDER — NITROFURANTOIN MONOHYD MACRO 100 MG PO CAPS
100.0000 mg | ORAL_CAPSULE | Freq: Two times a day (BID) | ORAL | 0 refills | Status: DC
Start: 2022-10-13 — End: 2023-01-05

## 2022-10-13 NOTE — Progress Notes (Signed)
Marcelle Overlie Plume,acting as a scribe for Vanna Scotland, MD.,have documented all relevant documentation on the behalf of Vanna Scotland, MD,as directed by  Vanna Scotland, MD while in the presence of Vanna Scotland, MD.  10/13/2022 5:05 PM   Elizabeth Trujillo 08-Oct-1935 [id number]  Referring provider: Bosie Clos, MD [address] [place],  [place] [address]  Chief Complaint  Patient presents with   Follow-up    HPI: 87 year-old female with a history of left distal ureteral upper tract TCC returns today with follow up CT urogram. She returned to the operating room in 06/2022 for a diagnostic ureteroscopy, left distal ureteral biopsy, and stent exchange. No additional malignancy was identified at that point in time. Prior to that, she had undergone ablative therapy with BCG, with stent in place.   She returns today with CT urogram, which was personally reviewed. Her hydronephrosis on the left had actually improved. She did have some hyper-enhancement of left distal ureter above the level of the narrowing, which was equivocal. Obviously, a residual tumor could not be excluded.    PMH: Past Medical History:  Diagnosis Date   Anemia    Arthritis    Breast cancer (HCC) 1986   left mastectomy   carcinoma in situ of urinary bladder 2019   BCG tx per pt   Collagen vascular disease (HCC)    RA    Diabetes mellitus without complication (HCC)    Elevated triglycerides with high cholesterol    Family history of bladder cancer    Family history of breast cancer    Family history of lung cancer    GERD (gastroesophageal reflux disease)    history of reflux   History of cancer of ureter    Hypercholesterolemia    Hypertension    history of    MCI (mild cognitive impairment)    Numbness and tingling in left hand    Primary ureteral papillary carcinoma, left (HCC) 2019   Surgical resection and BCG  tx per pt   Seizure (HCC)    last seizure was early 2023   Seizure-like activity  (HCC)    Stroke (HCC)    TIA-possible seizure instead of tia per neurologist   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HCC)    Left Ureter   UTI (urinary tract infection)    White matter disease     Surgical History: Past Surgical History:  Procedure Laterality Date   ABDOMINAL HYSTERECTOMY  09/1984   TAH/BSO   BIOPSY Left 11/11/2018   Procedure: BIOPSY;  Surgeon: Vanna Scotland, MD;  Location: [facility];  Service: Urology;  Laterality: Left;   BLADDER INSTILLATION N/A 01/23/2022   Procedure: BLADDER INSTILLATION OF GEMCITABINE;  Surgeon: Vanna Scotland, MD;  Location: [facility];  Service: Urology;  Laterality: N/A;   BLADDER INSTILLATION Left 06/26/2022   Procedure: BLADDER INSTILLATION OF GEMCITABINE;  Surgeon: Vanna Scotland, MD;  Location: [facility];  Service: Urology;  Laterality: Left;   BREAST BIOPSY Right 01/23/2013   [place] bx/clip-neg   BREAST SURGERY Left 07/1984   Mastectomy   callus removed from left toe     CATARACT EXTRACTION W/ INTRAOCULAR LENS IMPLANT Left 2015   COLONOSCOPY     CYSTOSCOPY W/ RETROGRADES Left 02/24/2019   Procedure: CYSTOSCOPY WITH RETROGRADE PYELOGRAM;  Surgeon: Vanna Scotland, MD;  Location: [facility];  Service: Urology;  Laterality: Left;   CYSTOSCOPY W/ RETROGRADES Left 01/23/2022   Procedure: CYSTOSCOPY WITH RETROGRADE PYELOGRAM;  Surgeon: Vanna Scotland, MD;  Location: [facility];  Service: Urology;  Laterality: Left;   CYSTOSCOPY W/ URETERAL STENT PLACEMENT Left 01/09/2022   Procedure: CYSTOSCOPY WITH RETROGRADE PYELOGRAM/URETERAL STENT PLACEMENT;  Surgeon: Vanna Scotland, MD;  Location: [facility];  Service: Urology;  Laterality: Left;   CYSTOSCOPY W/ URETERAL STENT PLACEMENT Left 01/23/2022   Procedure: CYSTOSCOPY WITH STENT EXCHANGE;  Surgeon: Vanna Scotland, MD;  Location: [facility];  Service: Urology;  Laterality: Left;   CYSTOSCOPY W/ URETERAL STENT PLACEMENT Left 06/26/2022   Procedure: CYSTOSCOPY WITH RETROGRADE PYELOGRAM/URETERAL STENT EXCHANGE;  Surgeon:  Vanna Scotland, MD;  Location: [facility];  Service: Urology;  Laterality: Left;   CYSTOSCOPY WITH BIOPSY N/A 02/24/2019   Procedure: CYSTOSCOPY WITH Bladder BIOPSY;  Surgeon: Vanna Scotland, MD;  Location: [facility];  Service: Urology;  Laterality: N/A;   CYSTOSCOPY WITH BIOPSY Left 06/26/2022   Procedure: URETERAL BIOPSY;  Surgeon: Vanna Scotland, MD;  Location: [facility];  Service: Urology;  Laterality: Left;   CYSTOSCOPY WITH FULGERATION N/A 02/24/2019   Procedure: CYSTOSCOPY WITH FULGERATION;  Surgeon: Vanna Scotland, MD;  Location: [facility];  Service: Urology;  Laterality: N/A;   CYSTOSCOPY WITH RETROGRADE PYELOGRAM, URETEROSCOPY AND STENT PLACEMENT Left 11/24/2019   Procedure: CYSTOSCOPY WITH RETROGRADE PYELOGRAM, URETEROSCOPY;  Surgeon: Vanna Scotland, MD;  Location: [facility];  Service: Urology;  Laterality: Left;   CYSTOSCOPY WITH URETEROSCOPY AND STENT PLACEMENT Left 10/14/2018   Procedure: CYSTOSCOPY WITH URETEROSCOPY AND STENT PLACEMENT;  Surgeon: Vanna Scotland, MD;  Location: [facility];  Service: Urology;  Laterality: Left;   CYSTOSCOPY WITH URETEROSCOPY AND STENT PLACEMENT Left 02/24/2019   Procedure: CYSTOSCOPY WITH URETEROSCOPY AND STENT Exchange;  Surgeon: Vanna Scotland, MD;  Location: [facility];  Service: Urology;  Laterality: Left;   CYSTOSCOPY/URETEROSCOPY/HOLMIUM LASER Left 01/23/2022   Procedure: CYSTOSCOPY/URETEROSCOPY/HOLMIUM LASER ABLATION OF URETERAL TUMOR;  Surgeon: Vanna Scotland, MD;  Location: [facility];  Service: Urology;  Laterality: Left;   CYSTOSCOPY/URETEROSCOPY/HOLMIUM LASER/STENT PLACEMENT Left 11/11/2018   Procedure: CYSTOSCOPY/URETEROSCOPY//STENT EXCHANGE;  Surgeon: Vanna Scotland, MD;  Location: [facility];  Service: Urology;  Laterality: Left;   EYE SURGERY Left 2013   cataracts   FOOT SURGERY Left 04/2013   HOLMIUM LASER APPLICATION Left 10/14/2018   Procedure: HOLMIUM LASER APPLICATION;  Surgeon: Vanna Scotland, MD;  Location: [facility];  Service:  Urology;  Laterality: Left;   JOINT REPLACEMENT     KNEE ARTHROPLASTY Right 11/03/2015   Procedure: COMPUTER ASSISTED TOTAL KNEE ARTHROPLASTY;  Surgeon: Donato Heinz, MD;  Location: [facility];  Service: Orthopedics;  Laterality: Right;   MASTECTOMY Left 1986   positive   TOTAL KNEE ARTHROPLASTY Left 03/23/2021   Procedure: TOTAL KNEE ARTHROPLASTY;  Surgeon: Lyndle Herrlich, MD;  Location: [facility];  Service: Orthopedics;  Laterality: Left;   URETERAL BIOPSY Left 10/14/2018   Procedure: URETERAL BIOPSY;  Surgeon: Vanna Scotland, MD;  Location: [facility];  Service: Urology;  Laterality: Left;   URETERAL BIOPSY Left 02/24/2019   Procedure: URETERAL BIOPSY;  Surgeon: Vanna Scotland, MD;  Location: [facility];  Service: Urology;  Laterality: Left;   URETERAL BIOPSY Left 01/09/2022   Procedure: URETERAL BIOPSY/TUMOR ABLATION;  Surgeon: Vanna Scotland, MD;  Location: [facility];  Service: Urology;  Laterality: Left;   URETEROSCOPY Left 01/09/2022   Procedure: URETEROSCOPY;  Surgeon: Vanna Scotland, MD;  Location: [facility];  Service: Urology;  Laterality: Left;   URETEROSCOPY Left 06/26/2022   Procedure: DIAGNOSTIC URETEROSCOPY;  Surgeon: Vanna Scotland, MD;  Location: [facility];  Service: Urology;  Laterality: Left;    Home Medications:  Allergies as  of 10/13/2022       Reactions   Penicillin V Potassium Hives   Has patient had a PCN reaction causing immediate rash, facial/tongue/throat swelling, SOB or lightheadedness with hypotension: no Has patient had a PCN reaction causing severe rash involving mucus membranes or skin necrosis: {no Has patient had a PCN reaction that required hospitalization no Has patient had a PCN reaction occurring within the last 10 years: no If all of the above answers are "NO", then may proceed with Cephalosporin use.   Sulfa Antibiotics Hives, Itching        Medication List        Accurate as of October 13, 2022  5:05 PM. If you have any questions, ask your  nurse or doctor.          acetaminophen 500 MG tablet Commonly known as: TYLENOL Take 1,000 mg by mouth every 6 (six) hours as needed for moderate pain (pain.).   aspirin 81 MG chewable tablet Chew 1 tablet (81 mg total) by mouth 2 (two) times daily. What changed: when to take this   atorvastatin 40 MG tablet Commonly known as: LIPITOR Take 1 tablet by mouth once daily   CALCIUM+D3 PO Take 1 tablet by mouth daily.   cholecalciferol 25 MCG (1000 UNIT) tablet Commonly known as: VITAMIN D3 Take 1,000 Units by mouth at bedtime.   donepezil 5 MG tablet Commonly known as: ARICEPT Take 5 mg by mouth daily with lunch.   HYDROcodone-acetaminophen 5-325 MG tablet Commonly known as: NORCO/VICODIN Take 1-2 tablets by mouth every 6 (six) hours as needed for moderate pain.   hydrocortisone 2.5 % ointment Apply topically as needed (rash; hemorrhoids).   ibuprofen 400 MG tablet Commonly known as: ADVIL Take 400 mg by mouth every 6 (six) hours as needed.   ketoconazole 2 % shampoo Commonly known as: NIZORAL Apply 1 application topically as needed (flaky/itchy scalp). Up to once per week   levETIRAcetam 500 MG tablet Commonly known as: KEPPRA Take 500 mg by mouth 2 (two) times daily.   metFORMIN 500 MG tablet Commonly known as: GLUCOPHAGE Take 1 tablet (500 mg total) by mouth daily with breakfast.   multivitamin with minerals Tabs tablet Take 1 tablet by mouth daily at 2 PM.   nitrofurantoin (macrocrystal-monohydrate) 100 MG capsule Commonly known as: MACROBID Take 1 capsule (100 mg total) by mouth 2 (two) times daily.   nystatin cream Commonly known as: MYCOSTATIN APPLY  CREAM TOPICALLY ONCE DAILY AS NEEDED FOR  DRY  SKIN  (YEAST  INFECTION)   oxybutynin 5 MG tablet Commonly known as: DITROPAN Take 1 tablet (5 mg total) by mouth every 8 (eight) hours as needed for bladder spasms.   tamsulosin 0.4 MG Caps capsule Commonly known as: Flomax Take 1 capsule (0.4 mg  total) by mouth daily.        Allergies:  Allergies  Allergen Reactions   Penicillin V Potassium Hives    Has patient had a PCN reaction causing immediate rash, facial/tongue/throat swelling, SOB or lightheadedness with hypotension: no Has patient had a PCN reaction causing severe rash involving mucus membranes or skin necrosis: {no Has patient had a PCN reaction that required hospitalization no Has patient had a PCN reaction occurring within the last 10 years: no If all of the above answers are "NO", then may proceed with Cephalosporin use.   Sulfa Antibiotics Hives and Itching    Family History: Family History  Problem Relation Age of Onset   Glaucoma Mother  Kidney disease Mother    Arthritis Mother    Stroke Mother    Breast cancer Mother 83   Bladder Cancer Mother 72   Heart disease Father    Cancer Brother 56       lung cancer, smoker   Breast cancer Cousin        3 mat cousins dx late 15s   Breast cancer Cousin        1 mat cousin dx 34s-60s    Social History:  reports that she has never smoked. She has never been exposed to tobacco smoke. She has never used smokeless tobacco. She reports that she does not drink alcohol and does not use drugs.   Physical Exam: BP 126/80   Pulse 73   Ht 5\' 2"  (1.575 m)   Wt 214 lb 4 oz (97.2 kg)   BMI 39.19 kg/m   Constitutional:  Alert and oriented, No acute distress.  Accompanied by her daughter today. HEENT: [place] AT, moist mucus membranes.  Trachea midline, no masses. Neurologic: Grossly intact, no focal deficits, moving all 4 extremities. Psychiatric: Normal mood and affect.   Pertinent Imaging:  CT HEMATURIA WORKUP  Narrative CLINICAL DATA:  Bladder cancer with left ureteral current, now status post renal stent removal  EXAM: CT ABDOMEN AND PELVIS WITHOUT AND WITH CONTRAST  TECHNIQUE: Multidetector CT imaging of the abdomen and pelvis was performed following the standard protocol before and following the  bolus administration of intravenous contrast.  RADIATION DOSE REDUCTION: This exam was performed according to the departmental dose-optimization program which includes automated exposure control, adjustment of the mA and/or kV according to patient size and/or use of iterative reconstruction technique.  CONTRAST:  OMNIPAQUE IOHEXOL 300 MG/ML  SOLN  COMPARISON:  12/20/2021  FINDINGS: Lower chest: Lung bases are clear.  Hepatobiliary: Liver is within normal limits.  Gallbladder is unremarkable. No intrahepatic or extrahepatic duct dilatation.  Pancreas: Within normal limits.  Spleen: Within normal limits.  Adrenals/Urinary Tract: Adrenal glands are within normal limits.  Mild left renal cortical atrophy. Right kidney is within normal limits.  Mild left hydroureteronephrosis, improved. Suspected narrowing/stricture of the left distal ureter (series 10/image 86). Possible mild hyperenhancement of the distal left ureter just above the level of the narrowing (series 10/image 61), equivocal, residual/recurrent tumor not excluded.  On delayed imaging, there are no filling defects in the bilateral opacified proximal collecting systems, ureters, or bladder.  Bladder is within normal limits.  Stomach/Bowel: Stomach is notable for a moderate hiatal hernia.  No evidence of bowel obstruction.  Normal appendix (series 3/image 24).  Left colonic diverticulosis, without evidence of diverticulitis.  Vascular/Lymphatic: No evidence of abdominal aortic aneurysm.  Atherosclerotic calcifications of the abdominal aorta and branch vessels.  No suspicious abdominopelvic lymphadenopathy.  Reproductive: Status post hysterectomy.  No adnexal masses.  Other: No abdominopelvic ascites.  Musculoskeletal: Degenerative changes of the visualized thoracolumbar spine.  IMPRESSION: Mild left hydroureteronephrosis, improved. Suspected narrowing/stricture of the left distal  ureter.  Possible mild hyperenhancement of the distal left ureter just above the level of the narrowing, equivocal, residual/recurrent tumor not excluded.  No findings suspicious for metastatic disease in the abdomen/pelvis.   Electronically Signed By: Charline Bills M.D. On: 09/26/2022 00:47  This was personally reviewed and I agree with the radiologic interpretation.    Assessment & Plan:    1. CIS - Recent CT urogram shows improvement in hydronephrosis but equivocal hyperenhancement which is suggestive of inflammatory but not definitive for this -Overall,  improved and as such, will not plan for any intervention at this time -Will plan to repeat delayed upper tract imaging at least every 6 months  - We will send her urine to cytology today and a repeat the day of the procedure - Return in 3 months for cystoscopy - Discontinue Flomax as the stent has been removed from previous procedure and is no longer necessary  2. Recurrent UTI - Pretreat with macrobid 100 mg starting 3 days prior to procedure   Return in about 3 months (around 01/13/2023) for cystoscopy.  I have reviewed the above documentation for accuracy and completeness, and I agree with the above.   Vanna Scotland, MD    [facility] [address] [place], [place] [address] [phone]

## 2022-11-02 DIAGNOSIS — K219 Gastro-esophageal reflux disease without esophagitis: Secondary | ICD-10-CM | POA: Diagnosis not present

## 2022-11-02 DIAGNOSIS — Z853 Personal history of malignant neoplasm of breast: Secondary | ICD-10-CM | POA: Diagnosis not present

## 2022-11-02 DIAGNOSIS — Z6836 Body mass index (BMI) 36.0-36.9, adult: Secondary | ICD-10-CM | POA: Diagnosis not present

## 2022-11-02 DIAGNOSIS — B356 Tinea cruris: Secondary | ICD-10-CM | POA: Diagnosis not present

## 2022-11-02 DIAGNOSIS — Z8554 Personal history of malignant neoplasm of ureter: Secondary | ICD-10-CM | POA: Diagnosis not present

## 2022-11-02 DIAGNOSIS — Z96651 Presence of right artificial knee joint: Secondary | ICD-10-CM | POA: Diagnosis not present

## 2022-11-02 DIAGNOSIS — Z6838 Body mass index (BMI) 38.0-38.9, adult: Secondary | ICD-10-CM | POA: Diagnosis not present

## 2022-11-02 DIAGNOSIS — E7800 Pure hypercholesterolemia, unspecified: Secondary | ICD-10-CM | POA: Diagnosis not present

## 2022-11-02 DIAGNOSIS — E119 Type 2 diabetes mellitus without complications: Secondary | ICD-10-CM | POA: Diagnosis not present

## 2022-11-02 DIAGNOSIS — L03311 Cellulitis of abdominal wall: Secondary | ICD-10-CM | POA: Diagnosis not present

## 2022-11-02 DIAGNOSIS — Z0000 Encounter for general adult medical examination without abnormal findings: Secondary | ICD-10-CM | POA: Diagnosis not present

## 2022-11-02 DIAGNOSIS — I10 Essential (primary) hypertension: Secondary | ICD-10-CM | POA: Diagnosis not present

## 2022-11-24 DIAGNOSIS — Z23 Encounter for immunization: Secondary | ICD-10-CM | POA: Diagnosis not present

## 2022-12-12 DIAGNOSIS — R5382 Chronic fatigue, unspecified: Secondary | ICD-10-CM | POA: Diagnosis not present

## 2022-12-12 DIAGNOSIS — R569 Unspecified convulsions: Secondary | ICD-10-CM | POA: Diagnosis not present

## 2022-12-12 DIAGNOSIS — R4689 Other symptoms and signs involving appearance and behavior: Secondary | ICD-10-CM | POA: Diagnosis not present

## 2022-12-13 ENCOUNTER — Other Ambulatory Visit: Payer: Self-pay | Admitting: Student

## 2022-12-13 DIAGNOSIS — R569 Unspecified convulsions: Secondary | ICD-10-CM

## 2023-01-04 ENCOUNTER — Telehealth: Payer: Self-pay | Admitting: Urology

## 2023-01-04 NOTE — Telephone Encounter (Signed)
Patient's husband Leonette Most) called regarding patient's appointment for Cysto on Friday 01/05/23. He stated that she usually takes 6 Macrobid pills prior to appointments. They have been out of town and just returned last night. She took 1 pill then, but will only have time to take 4 pills prior to her appointment. He asked if this was ok, or if appt should be rescheduled in order to be able to take all 6 pills. He said she takes these as a precaution for infection. Please advise patient. Phone number [phone]

## 2023-01-04 NOTE — Telephone Encounter (Signed)
She normally take AM and PM of the day before, AM / PM the day of, and AM/ PM the day after.  Please confirm this regimen with her.    Vanna Scotland, MD

## 2023-01-04 NOTE — Telephone Encounter (Signed)
Spoke with patient. Patient states that she has been taking it 3 days ahead of her appointment in the past. She took one last night at this point and then took 1 this morning and will take 1 this afternoon.

## 2023-01-05 ENCOUNTER — Ambulatory Visit (INDEPENDENT_AMBULATORY_CARE_PROVIDER_SITE_OTHER): Payer: Medicare Other | Admitting: Urology

## 2023-01-05 VITALS — BP 141/75 | HR 70

## 2023-01-05 DIAGNOSIS — N390 Urinary tract infection, site not specified: Secondary | ICD-10-CM | POA: Diagnosis not present

## 2023-01-05 DIAGNOSIS — D090 Carcinoma in situ of bladder: Secondary | ICD-10-CM | POA: Diagnosis not present

## 2023-01-05 DIAGNOSIS — Z8551 Personal history of malignant neoplasm of bladder: Secondary | ICD-10-CM | POA: Diagnosis not present

## 2023-01-05 DIAGNOSIS — C679 Malignant neoplasm of bladder, unspecified: Secondary | ICD-10-CM

## 2023-01-05 MED ORDER — NITROFURANTOIN MONOHYD MACRO 100 MG PO CAPS: 100.0000 mg | ORAL_CAPSULE | Freq: Two times a day (BID) | ORAL | 0 refills | Status: DC

## 2023-01-05 NOTE — Progress Notes (Signed)
   01/05/23  CC:  Chief Complaint  Patient presents with   Cysto     HPI: 87 year-old female with a history of left distal ureteral upper tract TCC returns today for surveillance cysto.   She returned to the operating room in 06/2022 for a diagnostic ureteroscopy, left distal ureteral biopsy, and stent exchange. No additional malignancy was identified at that point in time. Prior to that, she had undergone ablative therapy with BCG x 6, with stent in place.    Most recent imaging in the form of CT urogram with hydronephrosis on the left had actually improved. She did have some hyper-enhancement of left distal ureter above the level of the narrowing, which was equivocal. Obviously, a residual tumor could not be excluded.   No issues today.  Hearing and dizziness problems but no GU complaints.      Vitals:   01/05/23 1406  BP: (!) 141/75  Pulse: 70   NED. A&Ox3.   No respiratory distress   Abd soft, NT, ND Normal external genitalia with patent urethral meatus  Cystoscopy Procedure Note  Patient identification was confirmed, informed consent was obtained, and patient was prepped using Betadine solution.  Lidocaine jelly was administered per urethral meatus.    Procedure: - Flexible cystoscope introduced, without any difficulty.   - Thorough search of the bladder revealed:    normal urethral meatus    normal urothelium    no stones    no ulcers     no tumors    no urethral polyps    no trabeculation  - Ureteral orifices were normal in position and appearance.  Post-Procedure: - Patient tolerated the procedure well  Assessment/ Plan:  1. 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 6-month cystoscopy with periprocedural abx (macrobid x 3 days starting day before procedure)  2. 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

## 2023-01-11 DIAGNOSIS — Z23 Encounter for immunization: Secondary | ICD-10-CM | POA: Diagnosis not present

## 2023-01-16 ENCOUNTER — Ambulatory Visit
Admission: RE | Admit: 2023-01-16 | Discharge: 2023-01-16 | Disposition: A | Discharge status: Home / Self Care | Payer: Medicare Other | Source: Ambulatory Visit | Attending: Student | Admitting: Student

## 2023-01-16 DIAGNOSIS — R569 Unspecified convulsions: Secondary | ICD-10-CM

## 2023-01-16 DIAGNOSIS — I6782 Cerebral ischemia: Secondary | ICD-10-CM | POA: Diagnosis not present

## 2023-01-16 DIAGNOSIS — R4789 Other speech disturbances: Secondary | ICD-10-CM | POA: Diagnosis not present

## 2023-01-16 DIAGNOSIS — R410 Disorientation, unspecified: Secondary | ICD-10-CM | POA: Diagnosis not present

## 2023-02-02 DIAGNOSIS — R262 Difficulty in walking, not elsewhere classified: Secondary | ICD-10-CM | POA: Diagnosis not present

## 2023-02-02 DIAGNOSIS — Z96652 Presence of left artificial knee joint: Secondary | ICD-10-CM | POA: Diagnosis not present

## 2023-02-02 DIAGNOSIS — Z96651 Presence of right artificial knee joint: Secondary | ICD-10-CM | POA: Diagnosis not present

## 2023-02-02 DIAGNOSIS — R531 Weakness: Secondary | ICD-10-CM | POA: Diagnosis not present

## 2023-02-16 DIAGNOSIS — Z96652 Presence of left artificial knee joint: Secondary | ICD-10-CM | POA: Diagnosis not present

## 2023-02-16 DIAGNOSIS — Z96651 Presence of right artificial knee joint: Secondary | ICD-10-CM | POA: Diagnosis not present

## 2023-02-16 DIAGNOSIS — R531 Weakness: Secondary | ICD-10-CM | POA: Diagnosis not present

## 2023-02-16 DIAGNOSIS — R262 Difficulty in walking, not elsewhere classified: Secondary | ICD-10-CM | POA: Diagnosis not present

## 2023-02-23 DIAGNOSIS — B372 Candidiasis of skin and nail: Secondary | ICD-10-CM | POA: Diagnosis not present

## 2023-02-23 DIAGNOSIS — L821 Other seborrheic keratosis: Secondary | ICD-10-CM | POA: Diagnosis not present

## 2023-02-23 DIAGNOSIS — L304 Erythema intertrigo: Secondary | ICD-10-CM | POA: Diagnosis not present

## 2023-03-26 NOTE — Telephone Encounter (Signed)
 Patient advised that she does need to take her antibiotic prior to procedure starting 3 days prior like she has been doing in the past and that medication was sent in to [facility] in [place] at her last visit and to call them to get this filled now.

## 2023-03-26 NOTE — Telephone Encounter (Signed)
 She wants to know if she needs to take an antibotic before her CSYTO? Which is on the 24th in [place].

## 2023-04-02 ENCOUNTER — Ambulatory Visit
Admission: RE | Admit: 2023-04-02 | Discharge: 2023-04-02 | Disposition: A | Discharge status: Home / Self Care | Payer: Medicare Other | Source: Ambulatory Visit | Attending: Urology | Admitting: Urology

## 2023-04-02 DIAGNOSIS — C679 Malignant neoplasm of bladder, unspecified: Secondary | ICD-10-CM | POA: Insufficient documentation

## 2023-04-02 DIAGNOSIS — N390 Urinary tract infection, site not specified: Secondary | ICD-10-CM | POA: Insufficient documentation

## 2023-04-02 LAB — POCT I-STAT CREATININE: Creatinine, Ser: 0.7 mg/dL (ref 0.44–1.00)

## 2023-04-02 MED ORDER — IOHEXOL 300 MG/ML  SOLN
100.0000 mL | Freq: Once | INTRAMUSCULAR | Status: AC | PRN
  Administered 2023-04-02: 100 mL via INTRAVENOUS

## 2023-04-03 ENCOUNTER — Other Ambulatory Visit: Payer: Self-pay | Admitting: Urology

## 2023-04-03 DIAGNOSIS — D090 Carcinoma in situ of bladder: Secondary | ICD-10-CM

## 2023-04-03 DIAGNOSIS — N390 Urinary tract infection, site not specified: Secondary | ICD-10-CM

## 2023-04-06 ENCOUNTER — Ambulatory Visit (INDEPENDENT_AMBULATORY_CARE_PROVIDER_SITE_OTHER): Payer: Medicare Other | Admitting: Urology

## 2023-04-06 VITALS — BP 139/81 | HR 80 | Ht 63.0 in | Wt 218.0 lb

## 2023-04-06 DIAGNOSIS — Z8554 Personal history of malignant neoplasm of ureter: Secondary | ICD-10-CM | POA: Diagnosis not present

## 2023-04-06 DIAGNOSIS — Z08 Encounter for follow-up examination after completed treatment for malignant neoplasm: Secondary | ICD-10-CM | POA: Diagnosis not present

## 2023-04-06 DIAGNOSIS — C679 Malignant neoplasm of bladder, unspecified: Secondary | ICD-10-CM

## 2023-04-06 DIAGNOSIS — Z86008 Personal history of in-situ neoplasm of other site: Secondary | ICD-10-CM

## 2023-04-06 NOTE — Progress Notes (Signed)
   04/06/23  CC:  No chief complaint on file.    HPI: 88 year-old female with a history of left distal ureteral upper tract TCC returns today for surveillance cysto.   She was initially diagnosed with bladder cancer in 2019.  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 She underwent ablative therapy and BCG in 2019. She returned to the operating room on 12/20 for surveillance including a cystoscopy and ureteroscopy w/ NED at that time. Personally discussed w/ radiology regarding her distal ureter malignancy.    She went to the operating room on 11/2019 for surveillance ureteroscopy.  Bilateral  retrograde indicated some slight narrowing of the left distal ureter with prompt drainage, ureteroscopy was unremarkable.  She was on surveillance for.  Time and ultimately developed recurrence in 2023.   She had abnormal CT urogram on 12/2021 with development of new interval moderate left hydroureteronephrosis down to the level of the left distal ureter.  This is felt to be possibly secondary to recurrent stricture.  She return to the operating room shortly thereafter for biopsy and stent placement.  Biopsy was consistent with high-grade TCC presumably noninvasive of the left still ureter.  This was followed by second look procedure with laser ablation followed by BCG x 6 with stent in place.  She returned to the operating room in 06/2022 for a diagnostic ureteroscopy, left distal ureteral biopsy, and stent exchange. No additional malignancy was identified at that point in time.   Most recent imaging in the form of CT urogram with hydronephrosis on the left had actually improved. She did have some hyper-enhancement of left distal ureter above the level of the narrowing, which was equivocal. Obviously, a residual tumor could not be excluded.   CT urogram today is pending.  Appears stable from previous.    Vitals:    04/06/23 1412  BP: 139/81  Pulse: 80    NED. A&Ox3.   No respiratory distress   Abd soft, NT, ND Normal external genitalia with patent urethral meatus  Cystoscopy Procedure Note  Patient identification was confirmed, informed consent was obtained, and patient was prepped using Betadine solution.  Lidocaine jelly was administered per urethral meatus.    Procedure: - Flexible cystoscope introduced, without any difficulty.   - Thorough search of the bladder revealed:    normal urethral meatus    normal urothelium    no stones    no ulcers     no tumors    no urethral polyps    no trabeculation  - Ureteral orifices were normal in position and appearance.  Post-Procedure: - Patient tolerated the procedure well  Assessment/ Plan:  1. 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 73-month cystoscopy with periprocedural abx (macrobid x 3 days starting day before procedure)  2. 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  Will continue to follow this likely to every 6 month basis.  Will reach out to her with the final radiologic interpretation.  RTC cysto 3 mo   Vanna Scotland, MD

## 2023-07-06 ENCOUNTER — Other Ambulatory Visit: Payer: Self-pay | Admitting: Urology

## 2023-07-20 ENCOUNTER — Ambulatory Visit (INDEPENDENT_AMBULATORY_CARE_PROVIDER_SITE_OTHER): Admitting: Urology

## 2023-07-20 VITALS — BP 137/75 | HR 80 | Ht 62.0 in | Wt 224.0 lb

## 2023-07-20 DIAGNOSIS — N390 Urinary tract infection, site not specified: Secondary | ICD-10-CM | POA: Diagnosis not present

## 2023-07-20 DIAGNOSIS — Z8551 Personal history of malignant neoplasm of bladder: Secondary | ICD-10-CM

## 2023-07-20 NOTE — Progress Notes (Signed)
   07/20/23  CC:  Chief Complaint  Patient presents with   Cysto     HPI: 88 year-old female with a history of left distal ureteral upper tract TCC returns today for surveillance cysto.   She was initially diagnosed with bladder cancer in 2019.  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 She underwent ablative therapy and BCG in 2019. She returned to the operating room on 12/20 for surveillance including a cystoscopy and ureteroscopy w/ NED at that time. Personally discussed w/ radiology regarding her distal ureter malignancy.    She went to the operating room on 11/2019 for surveillance ureteroscopy.  Bilateral  retrograde indicated some slight narrowing of the left distal ureter with prompt drainage, ureteroscopy was unremarkable.  She was on surveillance for.  Time and ultimately developed recurrence in 2023.   She had abnormal CT urogram on 12/2021 with development of new interval moderate left hydroureteronephrosis down to the level of the left distal ureter.  This is felt to be possibly secondary to recurrent stricture.  She return to the operating room shortly thereafter for biopsy and stent placement.  Biopsy was consistent with high-grade TCC presumably noninvasive of the left still ureter.  This was followed by second look procedure with laser ablation followed by BCG x 6 with stent in place.  She returned to the operating room in 06/2022 for a diagnostic ureteroscopy, left distal ureteral biopsy, and stent exchange. No additional malignancy was identified at that point in time.   Serial CT urogram show hydronephrosis which is stable/improving, distal ureteral stricture but no definitive recurrent disease.      Vitals:   07/20/23 1444  BP: 137/75  Pulse: 80    NED. A&Ox3.   No respiratory distress   Abd soft, NT, ND Normal external genitalia with patent urethral meatus  Cystoscopy Procedure  Note  Patient identification was confirmed, informed consent was obtained, and patient was prepped using Betadine  solution.  Lidocaine  jelly was administered per urethral meatus.    Procedure: - Flexible cystoscope introduced, without any difficulty.   - Thorough search of the bladder revealed:    normal urethral meatus    normal urothelium with stellate scar just beyond her left UO.  Just proximal to the orifice, there is some very subtle textured change, query whether this is an early recurrence versus inflammatory.  Will follow closely.-    no stones    no ulcers     no tumors    no urethral polyps    no trabeculation  - Ureteral orifices were normal in position and appearance.  Post-Procedure: - Patient tolerated the procedure well  Assessment/ Plan:  1. CIS (carcinoma in situ of bladder)/ uppper tract urothelial cancer Cystoscopy today without any obvious metastatic or recurrent disease although there are some very subtle changes which will be monitored closely - Continue q. 73-month cystoscopy with periprocedural abx (macrobid  x 3 days starting day before procedure)  2. 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  Will continue to follow this likely to every 6 month basis. Given age and a lack of symptoms, family agrees with more conservative approach  RTC cysto 3 mo / f/u CT urogram  Dustin Gimenez, MD

## 2023-10-01 ENCOUNTER — Encounter: Payer: Self-pay | Admitting: Physician Assistant

## 2023-10-01 ENCOUNTER — Ambulatory Visit (INDEPENDENT_AMBULATORY_CARE_PROVIDER_SITE_OTHER): Admitting: Physician Assistant

## 2023-10-01 VITALS — BP 117/89 | HR 96 | Ht 63.0 in | Wt 216.4 lb

## 2023-10-01 DIAGNOSIS — R3129 Other microscopic hematuria: Secondary | ICD-10-CM

## 2023-10-01 LAB — URINALYSIS, COMPLETE
Bilirubin, UA: NEGATIVE
Glucose, UA: NEGATIVE
Nitrite, UA: NEGATIVE
Specific Gravity, UA: 1.025 (ref 1.005–1.030)
Urobilinogen, Ur: 1 mg/dL (ref 0.2–1.0)
pH, UA: 6 (ref 5.0–7.5)

## 2023-10-01 LAB — MICROSCOPIC EXAMINATION: RBC, Urine: >30 /HPF — AB (ref 0–2)

## 2023-10-01 NOTE — Patient Instructions (Signed)
 Please call [phone] to schedule your CT scan. I'd ideally like this to be done before your cystoscopy with Dr. Francisca, but if they can't squeeze you in before then, that's okay.

## 2023-10-01 NOTE — Progress Notes (Unsigned)
 10/01/2023 3:27 PM   Elizabeth Trujillo 09-23-1935 [id number]  CC: No chief complaint on file.  HPI: Elizabeth Trujillo is a 89 y.o. female with PMH CIS/upper tract urothelial carcinoma up-to-date with surveillance cystoscopy and due for repeat CT urogram and cystoscopy in 2 weeks who presents today for evaluation of gross hematuria.   Notably, at the time of her last surveillance cystoscopy with Dr. Penne in May, there was an area proximal to her left UO with a subtle textural change suggestive of possible early recurrence  Today she reports ***  In-office UA today positive for ***; urine microscopy with *** WBCs/HPF, *** RBCs/HPF, and ***. PVR ***mL.  PMH: Past Medical History:  Diagnosis Date   Anemia    Arthritis    Breast cancer (HCC) 1986   left mastectomy   carcinoma in situ of urinary bladder 2019   BCG tx per pt   Collagen vascular disease (HCC)    RA    Diabetes mellitus without complication (HCC)    Elevated triglycerides with high cholesterol    Family history of bladder cancer    Family history of breast cancer    Family history of lung cancer    GERD (gastroesophageal reflux disease)    history of reflux   History of cancer of ureter    Hypercholesterolemia    Hypertension    history of    MCI (mild cognitive impairment)    Numbness and tingling in left hand    Primary ureteral papillary carcinoma, left (HCC) 2019   Surgical resection and BCG  tx per pt   Seizure (HCC)    last seizure was early 2023   Seizure-like activity (HCC)    Stroke (HCC)    TIA-possible seizure instead of tia per neurologist   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HCC)    Left Ureter   UTI (urinary tract infection)    White matter disease     Surgical History: Past Surgical History:  Procedure Laterality Date   ABDOMINAL HYSTERECTOMY  09/1984   TAH/BSO   BIOPSY Left 11/11/2018   Procedure: BIOPSY;  Surgeon: Penne Knee, MD;  Location: [facility];  Service: Urology;  Laterality: Left;    BLADDER INSTILLATION N/A 01/23/2022   Procedure: BLADDER INSTILLATION OF GEMCITABINE ;  Surgeon: Penne Knee, MD;  Location: [facility];  Service: Urology;  Laterality: N/A;   BLADDER INSTILLATION Left 06/26/2022   Procedure: BLADDER INSTILLATION OF GEMCITABINE ;  Surgeon: Penne Knee, MD;  Location: [facility];  Service: Urology;  Laterality: Left;   BREAST BIOPSY Right 01/23/2013   us  bx/clip-neg   BREAST SURGERY Left 07/1984   Mastectomy   callus removed from left toe     CATARACT EXTRACTION W/ INTRAOCULAR LENS IMPLANT Left 2015   COLONOSCOPY     CYSTOSCOPY W/ RETROGRADES Left 02/24/2019   Procedure: CYSTOSCOPY WITH RETROGRADE PYELOGRAM;  Surgeon: Penne Knee, MD;  Location: [facility];  Service: Urology;  Laterality: Left;   CYSTOSCOPY W/ RETROGRADES Left 01/23/2022   Procedure: CYSTOSCOPY WITH RETROGRADE PYELOGRAM;  Surgeon: Penne Knee, MD;  Location: [facility];  Service: Urology;  Laterality: Left;   CYSTOSCOPY W/ URETERAL STENT PLACEMENT Left 01/09/2022   Procedure: CYSTOSCOPY WITH RETROGRADE PYELOGRAM/URETERAL STENT PLACEMENT;  Surgeon: Penne Knee, MD;  Location: [facility];  Service: Urology;  Laterality: Left;   CYSTOSCOPY W/ URETERAL STENT PLACEMENT Left 01/23/2022   Procedure: CYSTOSCOPY WITH STENT EXCHANGE;  Surgeon: Penne Knee, MD;  Location: [facility];  Service: Urology;  Laterality: Left;   CYSTOSCOPY  W/ URETERAL STENT PLACEMENT Left 06/26/2022   Procedure: CYSTOSCOPY WITH RETROGRADE PYELOGRAM/URETERAL STENT EXCHANGE;  Surgeon: Penne Knee, MD;  Location: [facility];  Service: Urology;  Laterality: Left;   CYSTOSCOPY WITH BIOPSY N/A 02/24/2019   Procedure: CYSTOSCOPY WITH Bladder BIOPSY;  Surgeon: Penne Knee, MD;  Location: [facility];  Service: Urology;  Laterality: N/A;   CYSTOSCOPY WITH BIOPSY Left 06/26/2022   Procedure: URETERAL BIOPSY;  Surgeon: Penne Knee, MD;  Location: [facility];  Service: Urology;  Laterality: Left;   CYSTOSCOPY WITH  FULGERATION N/A 02/24/2019   Procedure: CYSTOSCOPY WITH FULGERATION;  Surgeon: Penne Knee, MD;  Location: [facility];  Service: Urology;  Laterality: N/A;   CYSTOSCOPY WITH RETROGRADE PYELOGRAM, URETEROSCOPY AND STENT PLACEMENT Left 11/24/2019   Procedure: CYSTOSCOPY WITH RETROGRADE PYELOGRAM, URETEROSCOPY;  Surgeon: Penne Knee, MD;  Location: [facility];  Service: Urology;  Laterality: Left;   CYSTOSCOPY WITH URETEROSCOPY AND STENT PLACEMENT Left 10/14/2018   Procedure: CYSTOSCOPY WITH URETEROSCOPY AND STENT PLACEMENT;  Surgeon: Penne Knee, MD;  Location: [facility];  Service: Urology;  Laterality: Left;   CYSTOSCOPY WITH URETEROSCOPY AND STENT PLACEMENT Left 02/24/2019   Procedure: CYSTOSCOPY WITH URETEROSCOPY AND STENT Exchange;  Surgeon: Penne Knee, MD;  Location: [facility];  Service: Urology;  Laterality: Left;   CYSTOSCOPY/URETEROSCOPY/HOLMIUM LASER Left 01/23/2022   Procedure: CYSTOSCOPY/URETEROSCOPY/HOLMIUM LASER ABLATION OF URETERAL TUMOR;  Surgeon: Penne Knee, MD;  Location: [facility];  Service: Urology;  Laterality: Left;   CYSTOSCOPY/URETEROSCOPY/HOLMIUM LASER/STENT PLACEMENT Left 11/11/2018   Procedure: CYSTOSCOPY/URETEROSCOPY//STENT EXCHANGE;  Surgeon: Penne Knee, MD;  Location: [facility];  Service: Urology;  Laterality: Left;   EYE SURGERY Left 2013   cataracts   FOOT SURGERY Left 04/2013   HOLMIUM LASER APPLICATION Left 10/14/2018   Procedure: HOLMIUM LASER APPLICATION;  Surgeon: Penne Knee, MD;  Location: [facility];  Service: Urology;  Laterality: Left;   JOINT REPLACEMENT     KNEE ARTHROPLASTY Right 11/03/2015   Procedure: COMPUTER ASSISTED TOTAL KNEE ARTHROPLASTY;  Surgeon: Lynwood SHAUNNA Hue, MD;  Location: [facility];  Service: Orthopedics;  Laterality: Right;   MASTECTOMY Left 1986   positive   TOTAL KNEE ARTHROPLASTY Left 03/23/2021   Procedure: TOTAL KNEE ARTHROPLASTY;  Surgeon: Leora Lynwood SAUNDERS, MD;  Location: [facility];  Service: Orthopedics;   Laterality: Left;   URETERAL BIOPSY Left 10/14/2018   Procedure: URETERAL BIOPSY;  Surgeon: Penne Knee, MD;  Location: [facility];  Service: Urology;  Laterality: Left;   URETERAL BIOPSY Left 02/24/2019   Procedure: URETERAL BIOPSY;  Surgeon: Penne Knee, MD;  Location: [facility];  Service: Urology;  Laterality: Left;   URETERAL BIOPSY Left 01/09/2022   Procedure: URETERAL BIOPSY/TUMOR ABLATION;  Surgeon: Penne Knee, MD;  Location: [facility];  Service: Urology;  Laterality: Left;   URETEROSCOPY Left 01/09/2022   Procedure: URETEROSCOPY;  Surgeon: Penne Knee, MD;  Location: [facility];  Service: Urology;  Laterality: Left;   URETEROSCOPY Left 06/26/2022   Procedure: DIAGNOSTIC URETEROSCOPY;  Surgeon: Penne Knee, MD;  Location: [facility];  Service: Urology;  Laterality: Left;    Home Medications:  Allergies as of 10/01/2023       Reactions   Penicillin V Potassium Hives   Has patient had a PCN reaction causing immediate rash, facial/tongue/throat swelling, SOB or lightheadedness with hypotension: no Has patient had a PCN reaction causing severe rash involving mucus membranes or skin necrosis: {no Has patient had a PCN reaction that required hospitalization no Has patient had a PCN reaction occurring within the last 10 years:  no If all of the above answers are NO, then may proceed with Cephalosporin use.   Sulfa Antibiotics Hives, Itching        Medication List        Accurate as of October 01, 2023  3:27 PM. If you have any questions, ask your nurse or doctor.          acetaminophen  500 MG tablet Commonly known as: TYLENOL  Take 1,000 mg by mouth every 6 (six) hours as needed for moderate pain (pain.).   aspirin  EC 81 MG tablet Take 81 mg by mouth daily. Swallow whole.   atorvastatin  40 MG tablet Commonly known as: LIPITOR Take 1 tablet by mouth once daily   CALCIUM +D3 PO Take 1 tablet by mouth daily.   cholecalciferol  25 MCG (1000 UNIT) tablet Commonly  known as: VITAMIN D3 Take 1,000 Units by mouth at bedtime.   clotrimazole-betamethasone cream Commonly known as: LOTRISONE Apply 1 Application topically 2 (two) times daily.   donepezil 5 MG tablet Commonly known as: ARICEPT Take 5 mg by mouth daily with lunch.   hydrocortisone  2.5 % ointment Apply topically as needed (rash; hemorrhoids).   ibuprofen 400 MG tablet Commonly known as: ADVIL Take 400 mg by mouth every 6 (six) hours as needed.   ketoconazole 2 % shampoo Commonly known as: NIZORAL Apply 1 application topically as needed (flaky/itchy scalp). Up to once per week   levETIRAcetam  500 MG tablet Commonly known as: KEPPRA  Take 500 mg by mouth 2 (two) times daily.   metFORMIN  500 MG tablet Commonly known as: GLUCOPHAGE  Take 500 mg by mouth.   multivitamin with minerals Tabs tablet Take 1 tablet by mouth daily at 2 PM.   nitrofurantoin  (macrocrystal-monohydrate) 100 MG capsule Commonly known as: MACROBID  TAKE 1 CAPSULE BY MOUTH TWICE DAILY -  START  3  DAYS  BEFORE  YOUR  PROCEDURE   nystatin  cream Commonly known as: MYCOSTATIN  APPLY  CREAM TOPICALLY ONCE DAILY AS NEEDED FOR  DRY  SKIN  (YEAST  INFECTION)        Allergies:  Allergies  Allergen Reactions   Penicillin V Potassium Hives    Has patient had a PCN reaction causing immediate rash, facial/tongue/throat swelling, SOB or lightheadedness with hypotension: no Has patient had a PCN reaction causing severe rash involving mucus membranes or skin necrosis: {no Has patient had a PCN reaction that required hospitalization no Has patient had a PCN reaction occurring within the last 10 years: no If all of the above answers are NO, then may proceed with Cephalosporin use.   Sulfa Antibiotics Hives and Itching    Family History: Family History  Problem Relation Age of Onset   Glaucoma Mother    Kidney disease Mother    Arthritis Mother    Stroke Mother    Breast cancer Mother 56   Bladder Cancer Mother  66   Heart disease Father    Cancer Brother 46       lung cancer, smoker   Breast cancer Cousin        3 mat cousins dx late 78s   Breast cancer Cousin        1 mat cousin dx 30s-60s    Social History:   reports that she has never smoked. She has never been exposed to tobacco smoke. She has never used smokeless tobacco. She reports that she does not drink alcohol  and does not use drugs.  Physical Exam: BP 117/89   Pulse 96   Ht  5' 3 (1.6 m)   Wt 216 lb 6.4 oz (98.2 kg)   BMI 38.33 kg/m   Constitutional:  Alert and oriented, no acute distress, nontoxic appearing HEENT: [place], AT Cardiovascular: No clubbing, cyanosis, or edema Respiratory: Normal respiratory effort, no increased work of breathing GI: Abdomen is soft, nontender, nondistended, no abdominal masses GU: No CVA tenderness Lymph: No cervical or inguinal lymphadenopathy Skin: No rashes, bruises or suspicious lesions Neurologic: Grossly intact, no focal deficits, moving all 4 extremities Psychiatric: Normal mood and affect  Laboratory Data: Lab Results  Component Value Date   WBC 9.6 06/21/2022   HGB 13.7 06/21/2022   HCT 41.6 06/21/2022   MCV 89.8 06/21/2022   PLT 362 06/21/2022    Lab Results  Component Value Date   CREATININE 0.70 04/02/2023    CrCl cannot be calculated (Patient's most recent lab result is older than the maximum 21 days allowed.).  Results for orders placed or performed during the hospital encounter of 04/02/23  I-STAT creatinine   Collection Time: 04/02/23  5:09 PM  Result Value Ref Range   Creatinine, Ser 0.70 0.44 - 1.00 mg/dL    Pertinent Imaging: KUB, ***: *** Results for orders placed during the hospital encounter of 05/03/22  Abdomen 1 view (KUB)  Narrative CLINICAL DATA:  Hematuria  EXAM: ABDOMEN - 1 VIEW  COMPARISON:  None Available.  FINDINGS: Increased stool consistent with constipation. No bowel dilatation. There is a left ureteral stent. No radiopaque stones  identified.  IMPRESSION: Constipation. Noncontrast CT recommended to further evaluate for nephrolithiasis as this examination is limited.   Electronically Signed By: Fonda Field M.D. On: 05/05/2022 20:38  No results found for this or any previous visit.  No results found for this or any previous visit.  No results found for this or any previous visit.  No results found for this or any previous visit.  No results found for this or any previous visit.  Results for orders placed during the hospital encounter of 04/02/23  CT HEMATURIA WORKUP  Narrative CLINICAL DATA:  Bladder cancer, recurrent urinary tract infection. * Tracking Code: BO *  EXAM: CT ABDOMEN AND PELVIS WITHOUT AND WITH CONTRAST  TECHNIQUE: Multidetector CT imaging of the abdomen and pelvis was performed following the standard protocol before and following the bolus administration of intravenous contrast.  RADIATION DOSE REDUCTION: This exam was performed according to the departmental dose-optimization program which includes automated exposure control, adjustment of the mA and/or kV according to patient size and/or use of iterative reconstruction technique.  CONTRAST:  OMNIPAQUE  IOHEXOL  300 MG/ML  SOLN  COMPARISON:  09/20/2022.  FINDINGS: Lower chest: No acute findings. Heart is enlarged. No pericardial or pleural effusion. Distal esophagus is grossly unremarkable.  Hepatobiliary: Liver measures at the upper limits of normal in size, 17.8 cm. Liver and gallbladder are otherwise unremarkable. No biliary ductal dilatation.  Pancreas: Negative.  Spleen: Negative.  Adrenals/Urinary Tract: Adrenal glands are unremarkable. Probable small right renal sinus cysts. No specific follow-up necessary. Moderate left hydronephrosis to the level of the junction of the mid and distal thirds where there is a stricture with possible enhancement (coronal series 10, image 67). Hydronephrosis  has increased from 09/20/2022. Remainder of the left ureter is decompressed. No filling defects in the right antral renal collecting system or ureter. Subtotal opacification of the left intrarenal collecting system with no opacification of the left ureter, limiting evaluation. Bladder is grossly unremarkable.  Stomach/Bowel: Tiny hiatal hernia. Stomach, small bowel and colon  are otherwise unremarkable. Appendix is not readily visualized.  Vascular/Lymphatic: Atherosclerotic calcification of the aorta. No pathologically enlarged lymph nodes. Circumaortic left renal vein.  Reproductive: Hysterectomy.  No adnexal mass.  Other: Small bilateral inguinal hernias contain fat. No free fluid. Mesenteries and peritoneum are unremarkable.  Musculoskeletal: Osteopenia.  Degenerative changes in the spine.  IMPRESSION: 1. Moderate left hydronephrosis, increased from 09/20/2022, secondary to a stricture at the junction of the mid and distal thirds. There may be associated subtle enhancement, raising concern for urothelial carcinoma. 2.  Aortic atherosclerosis (ICD10-I70.0).   Electronically Signed By: Newell Eke M.D. On: 04/09/2023 12:51  No results found for this or any previous visit.   I personally reviewed the images referenced above and note ***.  Assessment & Plan:   1. Gross hematuria (Primary) *** - Urinalysis, Complete   No follow-ups on file.  Lucie Hones, PA-C  [facility] [place] [address] [place], [place] [address] [phone]

## 2023-10-02 NOTE — Progress Notes (Signed)
 Chief Complaint:   Chief Complaint  Patient presents with  . Podiatry Return    Discuss diabetic shoes. Left 4th toe tendon problems. Nail Trim. Last seen by Dr. Bertrum 09/25/2023.    Subjective  HPI  Elizabeth Trujillo is a 88 y.o. female who presents for Podiatry Return (Discuss diabetic shoes. Left 4th toe tendon problems. Nail Trim. Last seen by Dr. Bertrum 09/25/2023.) History of Present Illness Elizabeth Trujillo is an 88 year old female with a history of foot surgery and diabetes who presents with toe pain and difficulty walking.  She experiences pain in her toe, particularly when walking. The pain is located in the toe that had previously undergone surgery, where a pen was placed in the big toe and implants were placed in the other toes. The pain is not constant but is exacerbated by walking. She is not interested in further surgical intervention at this time.  She has a history of foot surgery where implants were placed in her toes, and a tendon release was performed. After surgery on her second toe, the tip of the toe now angles upwards while the rest of the toe remains down, which leads to pressure and pain when walking.  She has difficulty with physical therapy following knee surgery due to the toe pain, which affects her ability to walk and perform exercises. Her daughter mentions that she grips the ground with her toes, which are crooked, and inquires about potential therapies to improve her walking.  She has been using shoes from [employer], which require a prescription due to her foot deformity and diabetes. She has found two pairs of shoes she likes, one for dressy occasions and one more like a tennis shoe, but Medicare only covers one pair per year.  She has difficulty cutting her toenails due to their shape, which causes pain and potential for ingrown nails. She has been visiting a spa for nail care but finds it challenging to manage on her own.  She experiences swelling in her feet, which  is more pronounced as the day progresses. Elevation helps reduce the swelling.  Review of Systems  Patient Active Problem List  Diagnosis  . Obesity, unspecified  . Vitamin D  deficiency  . Dermatitis  . Essential (primary) hypertension  . Pure hypercholesterolemia  . Malignant neoplasm of female breast , unspecified (CMS-HCC)  . Other specified disorders of bone density and structure, unspecified site  . Status post total right knee replacement  . Type 2 diabetes mellitus without complication, without long-term current use of insulin (CMS/HHS-HCC)  . Osteoarthritis of right knee  . Aphasia  . Family history of bladder cancer  . Family history of breast cancer  . Family history of lung cancer  . GERD (gastroesophageal reflux disease)  . History of cancer of ureter  . Osteopenia  . TIA (transient ischemic attack)  . S/P total knee replacement, left    Outpatient Medications Prior to Visit  Medication Sig Dispense Refill  . acetaminophen  (TYLENOL ) 500 MG tablet Take by mouth.    . aspirin  81 MG EC tablet Take 81 mg by mouth once daily.    . atorvastatin  (LIPITOR) 40 MG tablet Take 1 tablet by mouth once daily 90 tablet 1  . cholecalciferol  (VITAMIN D3) 1000 unit tablet Take 1,000 Units by mouth once daily      . cyanocobalamin , vitamin B-12, (VITAMIN B-12 ORAL) Take 1 tablet by mouth once daily    . donepeziL (ARICEPT) 5 MG tablet TAKE 1 TABLET BY MOUTH  IN THE MORNING 90 tablet 3  . HYDROcodone -acetaminophen  (NORCO) 5-325 mg tablet TAKE 1 TO 2 TABLETS BY MOUTH EVERY 6 HOURS AS NEEDED FOR MODERATE PAIN    . ketoconazole (NIZORAL) 2 % shampoo LEAVE ON FOR 5 MINUTES THEN RINSE DAILY UNTIL CLEAR THEN WEEKLY FOR MAINTENANCE 120 mL 5  . levETIRAcetam  (KEPPRA ) 500 MG tablet Take 1 tablet (500 mg total) by mouth every morning AND 1.5 tablets (750 mg total) every evening. 225 tablet 3  . loperamide  (IMODIUM ) 2 mg capsule Take 2 mg by mouth 4 (four) times daily as needed for Diarrhea    .  metFORMIN  (GLUCOPHAGE ) 500 MG tablet Take 1 tablet (500 mg total) by mouth once daily (Patient taking differently: Take 500 mg by mouth daily with breakfast Patient is to take 750 mg at night.) 90 tablet 3  . MULTIVITAMIN ORAL Take 1 tablet by mouth once daily    . nitrofurantoin , macrocrystal-monohydrate, (MACROBID ) 100 MG capsule Take 100 mg by mouth every 12 (twelve) hours    . simethicone  (GAS-X EXTRA STRENGTH) 125 MG chewable tablet Take 125 mg by mouth every 6 (six) hours as needed for Flatulence    . hydrocortisone  2.5 % ointment APPLY THIN FILM TWICE DAILY TO AFFECTED PERI-RECTAL AREA AS NEEDED (Patient not taking: Reported on 10/02/2023)     No facility-administered medications prior to visit.      Objective  Vitals:   10/02/23 1604  BP: 128/81  Weight: 99.8 kg (220 lb 0.3 oz)  Height: 160 cm (5' 2.99)  PainSc: 0-No pain   Body mass index is 38.98 kg/m.  Home Vitals:     Physical Exam    General/Constitutional: No apparent distress: well-nourished and well developed.  Vascular:Left foot:  Dorsalis Pedis:  present Posterior Tibial:  present      Right foot:  Dorsalis Pedis:  present Posterior Tibial:  present  Neuro: Full sensation to light touch, vibratory, protective sensation  Derm: No ulcers or pre-ulcerative lesions.  The nails are longer thick mycotic in nature.  Subungual bree noted.  She is complaint of pain to the nails.  Ortho/MS:Painfree ROM to ankle, subtalar, midtarsal, and metatarsalphalangeal joints.  Full muscle strength to all major muscle groups of the lower extremity.  There is abnormality of the left fourth toe with plantar displacement at the DIPJ with dorsal displacement of the distal phalanx.    The following labs were personally reviewed: - Lab Results  Component Value Date   WBC 10.2 09/20/2023   HGB 13.8 09/20/2023   HCT 40.9 09/20/2023   PLT 287 09/20/2023   - Lab Results  Component Value Date   NA 144 09/20/2023   K 4.1  09/20/2023   CL 109 09/20/2023   CO2 26.9 09/20/2023   BUN 15 09/20/2023   CREATININE 0.7 09/20/2023   GLUCOSE 84 09/20/2023   - Lab Results  Component Value Date   NA 144 09/20/2023   K 4.1 09/20/2023   CL 109 09/20/2023   CO2 26.9 09/20/2023   BUN 15 09/20/2023   CREATININE 0.7 09/20/2023   CALCIUM  9.5 09/20/2023   ALB 3.9 09/20/2023   TBILI 0.6 09/20/2023   ALKPHOS 113 (H) 09/20/2023   AST 15 09/20/2023   ALT 12 09/20/2023   GLUCOSE 84 09/20/2023   GFR 84 09/20/2023   - Lab Results  Component Value Date   HGBA1C 6.6 (H) 07/04/2023      Results       Assessment/Plan:   Assessment &  Plan Foot deformity with malpositioned toe The malpositioned toe is causing discomfort due to pressure on the joint. Previous surgical interventions, including tendon release and implant placement, resulted in an overcorrection. The toe is now contracted down, causing pressure and pain. Physical therapy is limited due to fused joints. - Prescribe diabetic shoes to accommodate foot deformity and alleviate pressure. - Debrided toenails to alleviate discomfort.  Debridement performed in length and thickness with sterile nail nipper x 10 - Consider shoes with good arch support.  Diabetes mellitus without complications Diabetes is well-managed with metformin . No current significant complications. Discussed the importance of monitoring for wounds and sores due to potential risks such as decreased circulation and reduced ability to fight infections. Medicare covers one pair of diabetic shoes per year, and she has completed the necessary paperwork for coverage. - Continue metformin  as prescribed. - Monitor for any wounds or sores on feet. - Ensure paperwork for diabetic shoes is completed for Medicare coverage.  Swelling of lower extremities Swelling increases throughout the day. She experiences difficulty putting on compression stockings. - Recommend wearing compression stockings. -  Advise elevation of legs to reduce swelling. - Consider options for easier application of compression stockings.  Diagnoses and all orders for this visit:  Dermatophytosis of nail  Pain in toes of both feet  Type 2 diabetes mellitus without complication, without long-term current use of insulin (CMS/HHS-HCC)          Future Appointments     Date/Time Provider Department Center Visit Type   11/06/2023 2:30 PM Bertrum Charlie Raring, MD [facility] [place] [facility] OFFICE VISIT   12/11/2023 2:15 PM Maree Jannett Hering, MD [facility] RETURN VISIT       There are no Patient Instructions on file for this visit.   This note has been created using automated tools and reviewed for accuracy by JUSTIN ALLEN FOWLER.

## 2023-10-03 LAB — CULTURE, URINE COMPREHENSIVE

## 2023-10-04 ENCOUNTER — Other Ambulatory Visit: Payer: Self-pay | Admitting: Neurology

## 2023-10-04 ENCOUNTER — Ambulatory Visit: Payer: Self-pay | Admitting: Physician Assistant

## 2023-10-04 DIAGNOSIS — R4789 Other speech disturbances: Secondary | ICD-10-CM

## 2023-10-04 DIAGNOSIS — R569 Unspecified convulsions: Secondary | ICD-10-CM

## 2023-10-05 MED ORDER — NITROFURANTOIN MONOHYD MACRO 100 MG PO CAPS: 100.0000 mg | ORAL_CAPSULE | Freq: Two times a day (BID) | ORAL | 0 refills | Status: AC

## 2023-10-08 ENCOUNTER — Ambulatory Visit
Admission: RE | Admit: 2023-10-08 | Discharge: 2023-10-08 | Disposition: A | Discharge status: Home / Self Care | Source: Ambulatory Visit | Attending: Urology | Admitting: Urology

## 2023-10-08 ENCOUNTER — Ambulatory Visit
Admission: RE | Admit: 2023-10-08 | Discharge: 2023-10-08 | Disposition: A | Discharge status: Home / Self Care | Source: Ambulatory Visit | Attending: Neurology | Admitting: Neurology

## 2023-10-08 DIAGNOSIS — R4789 Other speech disturbances: Secondary | ICD-10-CM

## 2023-10-08 DIAGNOSIS — N390 Urinary tract infection, site not specified: Secondary | ICD-10-CM | POA: Diagnosis present

## 2023-10-08 DIAGNOSIS — Z8551 Personal history of malignant neoplasm of bladder: Secondary | ICD-10-CM | POA: Insufficient documentation

## 2023-10-08 DIAGNOSIS — R569 Unspecified convulsions: Secondary | ICD-10-CM | POA: Insufficient documentation

## 2023-10-08 MED ORDER — IOHEXOL 300 MG/ML  SOLN
100.0000 mL | Freq: Once | INTRAMUSCULAR | Status: AC | PRN
  Administered 2023-10-08: 100 mL via INTRAVENOUS

## 2023-10-16 ENCOUNTER — Ambulatory Visit (INDEPENDENT_AMBULATORY_CARE_PROVIDER_SITE_OTHER): Admitting: Urology

## 2023-10-16 ENCOUNTER — Encounter: Payer: Self-pay | Admitting: Urology

## 2023-10-16 VITALS — BP 119/78 | HR 83 | Ht 63.0 in | Wt 216.0 lb

## 2023-10-16 DIAGNOSIS — Z8551 Personal history of malignant neoplasm of bladder: Secondary | ICD-10-CM | POA: Diagnosis not present

## 2023-10-16 MED ORDER — LIDOCAINE HCL URETHRAL/MUCOSAL 2 % EX GEL
1.0000 | Freq: Once | CUTANEOUS | Status: AC
  Administered 2023-10-16: 1 via URETHRAL

## 2023-10-16 NOTE — Progress Notes (Signed)
 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 Penne.  She has a very long history of NMIBC.  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  Underwent laser ablation and BCG in 2019.  Recurrence in 2023 with left hydronephrosis, biopsy-proven high-grade urothelial cell carcinoma in the left distal ureter, second look laser ablation, BCG x 6 with stent in place.  Return to the OR April 2024 for diagnostic ureteroscopy which was benign.  She has had chronic left-sided hydronephrosis since 2023 felt to be related to stricture.  Renal function remains normal  After informed consent and discussion of the procedure and its risks, Elizabeth Trujillo was positioned and prepped in the standard fashion. Cystoscopy was performed with a flexible cystoscope. The urethra, bladder neck and entire bladder was visualized in a standard fashion. The ureteral orifices were visualized in their normal location and orientation.  Bladder mucosa grossly normal throughout, no abnormalities on retroflexion.  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  Per Dr. Bjorn prior notes, we will continue CT every 6 months, and cystoscopy every 3 to 4 months with Macrobid  3 days prior.   RTC 3 to 4 months cystoscopy, will transition to nitrofurantoin  prophylaxis day of procedure Continue CT every 6 months   Redell Burnet, MD 10/16/2023

## 2023-11-08 ENCOUNTER — Other Ambulatory Visit: Payer: Self-pay | Admitting: Family Medicine

## 2023-11-08 DIAGNOSIS — Z1231 Encounter for screening mammogram for malignant neoplasm of breast: Secondary | ICD-10-CM

## 2023-12-11 ENCOUNTER — Ambulatory Visit

## 2023-12-25 ENCOUNTER — Ambulatory Visit
Admission: RE | Admit: 2023-12-25 | Discharge: 2023-12-25 | Disposition: A | Discharge status: Home / Self Care | Source: Ambulatory Visit | Attending: Family Medicine | Admitting: Family Medicine

## 2023-12-25 DIAGNOSIS — Z1231 Encounter for screening mammogram for malignant neoplasm of breast: Secondary | ICD-10-CM | POA: Diagnosis present

## 2024-01-09 ENCOUNTER — Encounter: Payer: Self-pay | Admitting: Urology

## 2024-01-21 ENCOUNTER — Telehealth: Payer: Self-pay

## 2024-01-21 DIAGNOSIS — N390 Urinary tract infection, site not specified: Secondary | ICD-10-CM

## 2024-01-21 DIAGNOSIS — D090 Carcinoma in situ of bladder: Secondary | ICD-10-CM

## 2024-01-21 MED ORDER — NITROFURANTOIN MONOHYD MACRO 100 MG PO CAPS: 100.0000 mg | ORAL_CAPSULE | Freq: Two times a day (BID) | ORAL | 0 refills | Status: AC

## 2024-01-21 NOTE — Telephone Encounter (Signed)
 Sent in Macrobid  per Dr. Marval last note that patient is to take 3 days prior to upcoming cysto.

## 2024-01-22 ENCOUNTER — Other Ambulatory Visit: Admitting: Urology

## 2024-01-24 ENCOUNTER — Ambulatory Visit (INDEPENDENT_AMBULATORY_CARE_PROVIDER_SITE_OTHER): Admitting: Urology

## 2024-01-24 VITALS — BP 148/83 | HR 77 | Wt 216.0 lb

## 2024-01-24 DIAGNOSIS — Z8551 Personal history of malignant neoplasm of bladder: Secondary | ICD-10-CM

## 2024-01-24 MED ORDER — LIDOCAINE HCL URETHRAL/MUCOSAL 2 % EX GEL
1.0000 | Freq: Once | CUTANEOUS | Status: AC
  Administered 2024-01-24: 1 via URETHRAL

## 2024-01-24 NOTE — Progress Notes (Signed)
 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 Penne.  She has a very long history of NMIBC.  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  Underwent laser ablation and BCG in 2019.  Recurrence in 2023 with left hydronephrosis, biopsy-proven high-grade urothelial cell carcinoma in the left distal ureter, second look laser ablation, BCG x 6 with stent in place.  Return to the OR April 2024 for diagnostic ureteroscopy which was benign.  She has had chronic left-sided hydronephrosis since 2023 felt to be related to stricture.  Renal function remains normal  After informed consent and discussion of the procedure and its risks, SAMAIYA AWADALLAH was positioned and prepped in the standard fashion. Cystoscopy was performed with a flexible cystoscope. The urethra, bladder neck and entire bladder was visualized in a standard fashion. The ureteral orifices were visualized in their normal location and orientation.  Bladder mucosa grossly normal throughout, no abnormalities on retroflexion.  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  Per Dr. Bjorn prior notes, we will continue CT every 6 months, and cystoscopy every 3 to 4 months  RTC 3 to 4 months cystoscopy, will transition to nitrofurantoin  prophylaxis day of procedure x2 Continue CT every 6 months. Next Jan 2026   Redell Burnet, MD 01/24/2024

## 2024-01-24 NOTE — Patient Instructions (Signed)
 Please call [phone] or [phone] to schedule your imaging prior to your appointment.

## 2024-03-21 ENCOUNTER — Ambulatory Visit: Admission: RE | Admit: 2024-03-21 | Source: Ambulatory Visit

## 2024-03-25 ENCOUNTER — Ambulatory Visit
Admission: RE | Admit: 2024-03-25 | Discharge: 2024-03-25 | Disposition: A | Discharge status: Home / Self Care | Source: Ambulatory Visit | Attending: Urology | Admitting: Urology

## 2024-03-25 DIAGNOSIS — Z8551 Personal history of malignant neoplasm of bladder: Secondary | ICD-10-CM | POA: Insufficient documentation

## 2024-03-25 LAB — POCT I-STAT CREATININE: Creatinine, Ser: 0.8 mg/dL (ref 0.44–1.00)

## 2024-03-25 MED ORDER — IOHEXOL 300 MG/ML  SOLN
100.0000 mL | Freq: Once | INTRAMUSCULAR | Status: AC | PRN
  Administered 2024-03-25: 100 mL via INTRAVENOUS

## 2024-03-25 MED ORDER — SODIUM CHLORIDE 0.9 % IV SOLN: INTRAVENOUS | Status: DC

## 2024-03-31 ENCOUNTER — Ambulatory Visit: Payer: Self-pay | Admitting: Urology

## 2024-05-01 ENCOUNTER — Other Ambulatory Visit: Admitting: Urology
